# Patient Record
Sex: Female | Born: 1935 | Race: White | Hispanic: No | Smoking: Former smoker
Health system: Southern US, Community
[De-identification: ages and names within clinical notes are randomized; demographics above are authoritative.]

## PROBLEM LIST (undated history)

## (undated) DIAGNOSIS — H409 Unspecified glaucoma: Secondary | ICD-10-CM

## (undated) DIAGNOSIS — I340 Nonrheumatic mitral (valve) insufficiency: Secondary | ICD-10-CM

## (undated) DIAGNOSIS — D62 Acute posthemorrhagic anemia: Secondary | ICD-10-CM

## (undated) DIAGNOSIS — I442 Atrioventricular block, complete: Secondary | ICD-10-CM

## (undated) DIAGNOSIS — I10 Essential (primary) hypertension: Secondary | ICD-10-CM

## (undated) DIAGNOSIS — K649 Unspecified hemorrhoids: Secondary | ICD-10-CM

## (undated) DIAGNOSIS — I480 Paroxysmal atrial fibrillation: Secondary | ICD-10-CM

## (undated) DIAGNOSIS — E039 Hypothyroidism, unspecified: Secondary | ICD-10-CM

## (undated) DIAGNOSIS — F329 Major depressive disorder, single episode, unspecified: Secondary | ICD-10-CM

## (undated) DIAGNOSIS — E119 Type 2 diabetes mellitus without complications: Secondary | ICD-10-CM

## (undated) DIAGNOSIS — M1990 Unspecified osteoarthritis, unspecified site: Secondary | ICD-10-CM

## (undated) DIAGNOSIS — F32A Depression, unspecified: Secondary | ICD-10-CM

## (undated) DIAGNOSIS — I2510 Atherosclerotic heart disease of native coronary artery without angina pectoris: Secondary | ICD-10-CM

## (undated) DIAGNOSIS — Z8719 Personal history of other diseases of the digestive system: Secondary | ICD-10-CM

## (undated) DIAGNOSIS — I509 Heart failure, unspecified: Secondary | ICD-10-CM

## (undated) HISTORY — DX: Unspecified osteoarthritis, unspecified site: M19.90

## (undated) HISTORY — DX: Hypothyroidism, unspecified: E03.9

## (undated) HISTORY — DX: Unspecified glaucoma: H40.9

## (undated) HISTORY — DX: Heart failure, unspecified: I50.9

## (undated) HISTORY — DX: Atrioventricular block, complete: I44.2

## (undated) HISTORY — DX: Paroxysmal atrial fibrillation: I48.0

## (undated) HISTORY — DX: Personal history of other diseases of the digestive system: Z87.19

## (undated) HISTORY — DX: Type 2 diabetes mellitus without complications: E11.9

## (undated) HISTORY — DX: Unspecified hemorrhoids: K64.9

---

## 1998-10-14 ENCOUNTER — Other Ambulatory Visit: Admission: RE | Admit: 1998-10-14 | Discharge: 1998-10-14 | Payer: Self-pay | Admitting: Obstetrics & Gynecology

## 1998-10-16 ENCOUNTER — Encounter: Payer: Self-pay | Admitting: Cardiology

## 1998-10-16 ENCOUNTER — Inpatient Hospital Stay (HOSPITAL_COMMUNITY): Admission: EM | Admit: 1998-10-16 | Discharge: 1998-10-18 | Payer: Self-pay | Admitting: Cardiology

## 2004-03-05 ENCOUNTER — Ambulatory Visit (HOSPITAL_COMMUNITY): Admission: RE | Admit: 2004-03-05 | Discharge: 2004-03-05 | Payer: Self-pay | Admitting: Cardiology

## 2004-03-05 ENCOUNTER — Encounter (INDEPENDENT_AMBULATORY_CARE_PROVIDER_SITE_OTHER): Payer: Self-pay | Admitting: Cardiology

## 2005-06-14 ENCOUNTER — Ambulatory Visit (HOSPITAL_COMMUNITY): Admission: RE | Admit: 2005-06-14 | Discharge: 2005-06-14 | Payer: Self-pay | Admitting: Family Medicine

## 2006-07-06 ENCOUNTER — Ambulatory Visit (HOSPITAL_COMMUNITY): Admission: RE | Admit: 2006-07-06 | Discharge: 2006-07-06 | Payer: Self-pay | Admitting: Family Medicine

## 2006-12-27 HISTORY — PX: CHOLECYSTECTOMY: SHX55

## 2006-12-27 HISTORY — PX: ERCP: SHX60

## 2007-01-06 ENCOUNTER — Inpatient Hospital Stay (HOSPITAL_COMMUNITY): Admission: AD | Admit: 2007-01-06 | Discharge: 2007-01-11 | Payer: Self-pay | Admitting: Family Medicine

## 2007-01-06 ENCOUNTER — Ambulatory Visit (HOSPITAL_COMMUNITY): Admission: RE | Admit: 2007-01-06 | Discharge: 2007-01-06 | Payer: Self-pay | Admitting: Family Medicine

## 2007-01-07 ENCOUNTER — Ambulatory Visit: Payer: Self-pay | Admitting: Internal Medicine

## 2007-01-11 ENCOUNTER — Encounter (INDEPENDENT_AMBULATORY_CARE_PROVIDER_SITE_OTHER): Payer: Self-pay | Admitting: Specialist

## 2007-03-22 ENCOUNTER — Ambulatory Visit: Payer: Self-pay | Admitting: Internal Medicine

## 2007-03-24 ENCOUNTER — Ambulatory Visit (HOSPITAL_COMMUNITY): Admission: RE | Admit: 2007-03-24 | Discharge: 2007-03-24 | Payer: Self-pay | Admitting: Internal Medicine

## 2007-04-04 ENCOUNTER — Ambulatory Visit: Payer: Self-pay | Admitting: Internal Medicine

## 2007-04-04 ENCOUNTER — Ambulatory Visit (HOSPITAL_COMMUNITY): Admission: RE | Admit: 2007-04-04 | Discharge: 2007-04-04 | Payer: Self-pay | Admitting: Internal Medicine

## 2008-03-07 ENCOUNTER — Ambulatory Visit (HOSPITAL_COMMUNITY): Admission: RE | Admit: 2008-03-07 | Discharge: 2008-03-07 | Payer: Self-pay | Admitting: Family Medicine

## 2008-10-22 ENCOUNTER — Ambulatory Visit: Payer: Self-pay | Admitting: Gastroenterology

## 2008-10-29 ENCOUNTER — Ambulatory Visit (HOSPITAL_COMMUNITY): Admission: RE | Admit: 2008-10-29 | Discharge: 2008-10-29 | Payer: Self-pay | Admitting: Gastroenterology

## 2008-10-29 ENCOUNTER — Ambulatory Visit: Payer: Self-pay | Admitting: Gastroenterology

## 2008-10-29 ENCOUNTER — Encounter: Payer: Self-pay | Admitting: Gastroenterology

## 2008-12-25 ENCOUNTER — Ambulatory Visit: Payer: Self-pay | Admitting: Gastroenterology

## 2008-12-25 LAB — CONVERTED CEMR LAB
HCT: 36.3 % (ref 36.0–46.0)
Hemoglobin: 11.7 g/dL — ABNORMAL LOW (ref 12.0–15.0)

## 2009-04-24 DIAGNOSIS — E119 Type 2 diabetes mellitus without complications: Secondary | ICD-10-CM

## 2009-04-24 DIAGNOSIS — H409 Unspecified glaucoma: Secondary | ICD-10-CM

## 2009-04-24 DIAGNOSIS — I10 Essential (primary) hypertension: Secondary | ICD-10-CM

## 2009-04-24 DIAGNOSIS — Z8719 Personal history of other diseases of the digestive system: Secondary | ICD-10-CM

## 2009-04-24 DIAGNOSIS — K649 Unspecified hemorrhoids: Secondary | ICD-10-CM | POA: Insufficient documentation

## 2009-04-24 DIAGNOSIS — M1990 Unspecified osteoarthritis, unspecified site: Secondary | ICD-10-CM | POA: Insufficient documentation

## 2010-05-12 ENCOUNTER — Ambulatory Visit (HOSPITAL_COMMUNITY): Admission: RE | Admit: 2010-05-12 | Discharge: 2010-05-12 | Payer: Self-pay | Admitting: Family Medicine

## 2011-05-11 NOTE — Consult Note (Signed)
NAMEANJANNETTE, GAUGER                ACCOUNT NO.:  [id number]   MEDICAL RECORD NO.:  [id number]           PATIENT TYPE:  AMB   LOCATION:  DAY                           FACILITY:  [facility]   PHYSICIAN:  Kassie Mends, M.D.      DATE OF BIRTH:  02/05/1940   DATE OF CONSULTATION:  10/22/2008  DATE OF DISCHARGE:                                 CONSULTATION   REFERRING PHYSICIAN:  Melvyn Novas, MD   REASON FOR CONSULTATION:  Anemia.   HISTORY OF PRESENT ILLNESS:  Ms. Nasby is a 77 year old female who last  had a colonoscopy in February 2005 by Dr. Randa Evens in [place].  She  had no polyps, but she stated she had small veins that were seeping.  She sees rectal bleeding less than once a week.  It is slightly red and  a tinge on her underwear.  Her blood usually occurs after she eats fresh  vegetables and has increased number of bowel movements.  The blood is  usually seen 2-3 times after she has a bowel movement.  She denies any  black tarry stools.  She stopped her Slow FE in May 2009, and her  hemoglobin was measured in July 2009, and it was 10.5 with an MCV of  89.2 and a creatinine of 0.70.  Her last colonoscopy, she had problems  with the prep.  The prep caused her to have vomiting and she could not  keep it down.  She does eat meat just about daily.  She did not have  any problem swallowing, nausea, or vomiting.  She does not consume  aspirin, BC, or Goody powder.  She uses Advil less than once a month.  If she takes her Nexium, she does not have heartburn.   PAST MEDICAL HISTORY:  1. Diabetes.  2. Hypertension.  3. Glaucoma.   ALLERGIES:  QUININE and PRINIVIL.   MEDICATIONS:  1. Benicar 40/12.5 mg daily.  2. Norvasc 10 mg daily.  3. Nexium 40 mg daily.  4. Januvia 100 mg daily.  5. Glucophage 500 two daily.  6. Iron.  7. Alphagan.  8. Lumigan.   PAST SURGICAL HISTORY:  Cholecystectomy in January 2008 due to  choledocholithiasis with ERCP and sphincterotomy in  January 2008 and  April 2008.   FAMILY HISTORY:  She denies any family history of colon cancer or colon  polyps.   SOCIAL HISTORY:  She has been married for approximately 58 years.  She  is retired from working various odd jobs.  She quit smoking 5 years ago.  She does not drink any alcohol.   REVIEW OF SYSTEMS:  As per the HPI.  Otherwise, all systems are  negative.   PHYSICAL EXAMINATION:  VITAL SIGNS:  Weight 160 pounds, height 5 feet 8  inches, BMI 24.3 (healthy), temperature 98.1, blood pressure 150/74, and  pulse 78.GENERAL:  She is in no apparent distress.  Alert and oriented  x4.HEENT:  Atraumatic and normocephalic.  Pupils are equal and react to  light.  Mouth, no oral lesions.  Posterior pharynx without erythema or  exudate.LUNGS:  Clear to auscultation bilaterally.CARDIOVASCULAR:  Regular rate and rhythm.  No murmur.  Normal S1 and S2.ABDOMEN:  Bowel  sounds are present.  Soft, nontender, and nondistended.  No rebound or  guarding.EXTREMITIES:  No cyanosis or edema.NEUROLOGIC:  She has no  focal neurologic deficits.   ASSESSMENT:  Ms. Flener is a 77 year old female who presents with a  normocytic anemia and rectal bleeding.  The differential diagnosis  includes colorectal polyp, colorectal malignancy, or arteriovenous  malformations in the gastrointestinal tract.  She has a little  likelihood of having a gastric malignancy. Thank you for allowing me to  see Ms. Fredric Mare in consultation.  My recommendations follow.   RECOMMENDATIONS:  1. She will be scheduled for colonoscopy with a MiraLax bowel prep.      She should hold her Januvia on the day of her procedure.  2. She will be scheduling a follow up appointment in 3 months.  Will      also check a hemoglobin, hematocrit, and ferritin today.      Kassie Mends, M.D.  Electronically Signed     SM/MEDQ  D:  10/22/2008  T:  10/23/2008  Job:  [id number]   cc:   Melvyn Novas, MD  Fax: [phone]

## 2011-05-11 NOTE — Assessment & Plan Note (Signed)
NAMEMarland [name]  CRYSTALL, [name]                 CHART#:  [id number]   DATE:  12/25/2008                       DOB:  06-Sep-1932   REFERRING Sharma Lawrance:  Melvyn Novas, MD   PROBLEM LIST:  1. 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  2. Sigmoid colon diverticulosis.  3. Tubular adenoma of the hepatic flexure and rectum on colonoscopy in      November 2009.  4. Moderate internal hemorrhoids.  5. Diabetes.  6. Hypertension.  7. Glaucoma.   SUBJECTIVE:  [name] is a 77 year old female who presents as a return  patient visit.  She was last seen for colonoscopy in November 2009.  She  had AVMs ablated.  She had an upper endoscopy in 2005 with benign small  bowel biopsies.  Her hemoglobin and hematocrit were checked in October  2009, and she was on iron at that time.  Her hemoglobin was 12 and her  ferritin was 17.  She denies any bright red blood per rectum or black  stool.  She denies any nausea, vomiting, or craving ice.  She does not  feel tired.  She has no heartburn or indigestion as long as she takes  Nexium.  She has 1-2 good bowel movements a day.  Her iron was stopped  in October.   MEDICATIONS:  1. Benicar.  2. Norvasc.  3. Nexium 40 mg daily.  4. Januvia.  5. Glucophage.  6. Alphagan.  7. Lumigan.  8. B12.   OBJECTIVE:  VITAL SIGNS:  Weight 160 pounds, height 5 feet 8 inches,  temperature 98.2, blood pressure 122/78, and pulse 80.GENERAL:  She is  in no apparent distress.  Alert and oriented x4.LUNGS:  Clear to  auscultation bilaterally.CARDIOVASCULAR:  Regular rhythm.ABDOMEN:  Bowel  sounds are present.  Soft, nontender, and nondistended.   ASSESSMENT:  [name] is a 77 year old female who has cecal and  ascending colon arteriovenous malformations, likely contributing to her  normocytic anemia.  She also had a low normal iron.  She had 2 tubular  simple adenomas removed in October 2009. Thank you for allowing me to  see [name] in consultation.  My  recommendations follow.   RECOMMENDATIONS:  1. Screening colonoscopy in 10 years if she remains healthy.  2. Continue Nexium.  3. Will check her hemoglobin, hematocrit, and ferritin and call her at      home with the results.  4. She may follow up with me as needed.       Kassie Mends, M.D.  Electronically Signed     SM/MEDQ  D:  12/25/2008  T:  12/25/2008  Job:  [id number]   cc:   Melvyn Novas, MD

## 2011-05-11 NOTE — Op Note (Signed)
NAMEJEROLYN, [name]                ACCOUNT NO.:  [id number]   MEDICAL RECORD NO.:  [id number]          PATIENT TYPE:  AMB   LOCATION:  DAY                           FACILITY:  [facility]   PHYSICIAN:  Kassie Mends, M.D.      DATE OF BIRTH:  1932-12-05   DATE OF PROCEDURE:  DATE OF DISCHARGE:                               OPERATIVE REPORT   REFERRING PHYSICIAN:  Melvyn Novas, MD   PROCEDURE:  Colonoscopy with ablation of AVMs, cold forceps polypectomy,  and snare cautery polypectomy.   INDICATION FOR EXAM:  [name] is a 77 year old female who presents  with a normocytic anemia and requirement for iron chronically.  She had  a colonoscopy in 2005 and said she has small veins that were seeping.  She occasionally sees red blood from her rectum.   FINDINGS:  1. Small cecal AVM ablated (20 w) using the heater probe.  Large      ascending colon AVM ablated (25 w) using the heater probe.  The      ascending colon polyp bled a significant amount when cautery was      applied.  2. Frequent sigmoid colon diverticula.  3. A 4-mm hepatic flexure polyp removed via cold forceps.  A 6-mm      sessile rectal polyp removed via snare cautery.  Otherwise, no      masses or inflammatory changes seen.  4. Moderate internal hemorrhoids.   DIAGNOSES:  1. Rectal bleeding likely secondary to hemorrhoids.  2. Normocytic anemia likely secondary to colon arteriovenous      malformations.   RECOMMENDATIONS:  1. [name] already has a follow up appointment to see me in 3      months.  We will check her hemoglobin and hematocrit and ferritin      at that time.  2. No aspirin, NSAIDs, or anticoagulation for 7 days.  3. Will call [name] with the results of her biopsies.  If she has      a simple adenoma, then she may have screening colonoscopy in 10      years.  4. She should follow a high-fiber diet.  She was given a handout on      high-fiber diet, polyps, diverticulosis, and  hemorrhoids.  5. If she has significant amount of rectal bleeding, then she would be      a candidate for Anusol-HC per rectum every 12 hours for 10 days.   MEDICATIONS:  1. Demerol 50 mg IV.  2. Versed 4 mg IV.   PROCEDURE TECHNIQUE:  Physical exam was performed.  Informed consent was  obtained from the patient after explaining the benefits, risks, and  alternatives to the procedure.  The patient was connected to the monitor  and placed in the left lateral position.  Continuous oxygen was provided  by nasal cannula.  IV medicine administered through an indwelling  cannula.  After administration of sedation and rectal exam, the  patient's rectum was intubated, and the scope was advanced under direct  visualization to the cecum.  The scope was removed slowly  by carefully  examining the color, texture, anatomy, and integrity of the mucosa on  the way out.  The patient was recovered in endoscopy and discharged home  in satisfactory condition.      Kassie Mends, M.D.  Electronically Signed     SM/MEDQ  D:  10/29/2008  T:  10/29/2008  Job:  [id number]   cc:   Melvyn Novas, MD  Fax: [phone]

## 2011-05-14 NOTE — H&P (Signed)
NAMEJESSCIA, IMM                ACCOUNT NO.:  [id number]   MEDICAL RECORD NO.:  [id number]          PATIENT TYPE:  INP   LOCATION:  A318                          FACILITY:  [facility]   PHYSICIAN:  Melvyn Novas, MDDATE OF BIRTH:  1932/08/04   DATE OF ADMISSION:  01/06/2007  DATE OF DISCHARGE:  LH                              HISTORY & PHYSICAL   The patient is a 77 year old white female with a 4-day history of right  upper quadrant pain radiating to her right shoulder. This was associated  with nausea and dyspepsia; however, no vomiting, melena, hematemesis or  hematochezia. Liver function tests were essentially normal in office.  She came in two days later, and CAT scan revealed choledocholithiasis,  and she is admitted for presumed ERCP intervention and presumed  cholecystectomy due to her diabetic status. She denied any anginal pain,  orthopnea, PND.   PAST MEDICAL HISTORY:  1. Significant for type 2 diabetes.  2. Hypertension.  3. Degenerative joint disease.   PAST SURGICAL HISTORY:  Remarkable only for mandibular repair as a  youth.   CURRENT MEDICATIONS:  1. Norvasc 10.  2. Januvia 100 daily.  3. Glucophage 500 b.i.d.  4. Benicar 40/12.5 daily.  5. Nexium 40 mg per day.   She is a nonsmoker. Does not imbibe alcohol. Is married and lives with  husband.   PHYSICAL EXAMINATION:  Blood pressure is 132/78, pulse is 80 and  regular, temperature 99.2, respiratory rate is 20.  HEAD:  Normocephalic, atraumatic. PERRLA. EOM intact. Sclerae clear.  Conjunctivae pink.  NECK:  Shows no JVD, no carotid bruits, no thyromegaly, no thyroid  bruits.  LUNGS:  Show clear to A&P. No rales, wheeze or rhonchi appreciable.  HEART:  Regular rhythm. No murmurs, gallops, heaves, thrills or rubs.  ABDOMEN:  Soft, nontender. Bowel sounds are normoactive with no guarding  or rebound. No masses. No megaly. No peristaltic rushes noted. No  significant right upper quadrant tenderness.  EXTREMITIES:  No clubbing, cyanosis, or edema.  NEUROLOGICAL:  Cranial nerves II-XII grossly intact. The patient moves  all 4 extremities. Plantars are downgoing.   IMPRESSION:  1. Common bile duct stones, multiple.  2. Right upper quadrant pain.  3. Increased liver function tests.  4. Hypertension.  5. Diabetes.   The plan is to admit and make n.p.o. Have a possible ERCP for  intervention of choledocholithiasis. Make further recommendations as the  database expands.      Melvyn Novas, MD  Electronically Signed     RMD/MEDQ  D:  01/09/2007  T:  01/10/2007  Job:  [id number]

## 2011-05-14 NOTE — Discharge Summary (Signed)
NAMEMARGARETHA, MAHAN                ACCOUNT NO.:  [id number]   MEDICAL RECORD NO.:  [id number]          PATIENT TYPE:  INP   LOCATION:  A318                          FACILITY:  [facility]   PHYSICIAN:  Dalia Heading, M.D.  DATE OF BIRTH:  02/12/1932   DATE OF ADMISSION:  01/06/2007  DATE OF DISCHARGE:  01/16/2008LH                               DISCHARGE SUMMARY   HOSPITAL COURSE:  The patient is a 77 year old white female who was  admitted to Dr. Janna Arch for further evaluation and treatment of right  upper quadrant pain with a choledocholithiasis.  Dr. Karilyn Cota of  gastroenterology was consulted and the patient underwent an ERCP with  endoscopic stone extraction on January 07, 2007.  She tolerated the  procedure well.  Subsequently, I was consulted from the general surgery  standpoint to perform a laparoscopic cholecystectomy.  She was more  anemic during her hospital course stay which was felt to be secondary to  intravenous hydration and chronic disease.  She did undergo two units of  packed red blood cell transfusions prior to her surgery.  She  subsequently underwent an uneventful laparoscopic cholecystectomy on  January 11, 2007.  She tolerated the procedure well.   Her postoperative course was unremarkable.  The patient was discharged  home on January 11, 2007 in good improving condition.   DISCHARGE INSTRUCTIONS:  The patient is to follow up with Dr. Franky Macho on January 19, 2007.   DISCHARGE MEDICATIONS:  Darvocet-N 100 one to two tablets p.o. q.6h.  p.r.n. pain.  She is to resume all her other mediations as previously  prescribed.   PRINCIPAL DIAGNOSIS:  1. Cholecystitis, cholelithiasis.  2. Choledocholithiasis.  3. Non-insulin-dependent diabetes mellitus.  4. Hypertension.  5. Degenerative joint disease.   PRINCIPAL PROCEDURES:  1. ERCP with stone extraction by Dr. Karilyn Cota on January 07, 2007.  2. Laparoscopic cholecystectomy by Dr. Franky Macho on January 11, 2007.      Dalia Heading, M.D.  Electronically Signed     MAJ/MEDQ  D:  01/11/2007  T:  01/12/2007  Job:  [id number]   cc:   Lionel December, M.D.  [address]  [place]  [place] [address]   Melvyn Novas, MD  Fax: [phone]

## 2011-05-14 NOTE — Consult Note (Signed)
[name], WAINWRIGHT                ACCOUNT NO.:  [id number]   MEDICAL RECORD NO.:  [id number]          PATIENT TYPE:  INP   LOCATION:  A318                          FACILITY:  [facility]   PHYSICIAN:  Lionel December, M.D.    DATE OF BIRTH:  1931-12-30   DATE OF CONSULTATION:  01/06/2007  DATE OF DISCHARGE:                                 CONSULTATION   REASON FOR CONSULTATION:  Choledocholithiasis.   HISTORY OF PRESENT ILLNESS:  Ms. Tena is a 77 year old Caucasian  female who was in her usual state of health until 8 days ago, when she  noted infrascapular pain radiating into her right upper quadrant.  It  was sharp pain and at times intense.  She did not experience any nausea,  vomiting, fever or chills for the first 4 or 5 days, but 4 days ago she  developed nausea, vomiting as well as chills.  She was seen by Dr.  Vickey Huger DonDiego.  She was begun on Levaquin.  She also had lab  studies and her LFTs were abnormal.  This morning she had abdominal  pelvic CT.  She had dilated intra and extrahepatic biliary system.  Her  CBD measured 12 mm with 1 cm size stone, as well as sludge.  She also  had periampullary diverticulum.  She was also noted to have left colonic  diverticulosis.  The patient was therefore hospitalized for further  therapy.  The last time she threw up was yesterday.  She states she has  not been eating well since her nausea and vomiting began.  She has been  on clear liquids.  She has noted dark-colored urine, but no change in  her stools.  She denies recent weight loss, melena or rectal bleeding.  She states her heartburn is well-controlled with therapy.  The patient  states that she had vague pain in the right upper quadrant and  infrascapular area in August last year.  She had an ultrasound done at  the outside facility, and she was found to have stones.  Dr. Janna Arch  was not convinced that her symptoms were due to cholelithiasis, and  therefore surgery was not  recommended.   HOME MEDICATIONS:  1. Norvasc 10 mg daily.  2. Benicar 40/12.5 daily.  3. Januvia 100 mg p.o. daily.  4. Nexium 40 mg p.o. q.a.m.  5. Alphagan eye drops to both eyes b.i.d.  6. Lumigan eye drops, both eyes once daily.  7. Glucophage 1 gram daily.  8. Levaquin 500 mg daily. (but she did not take this today).   PAST MEDICAL HISTORY:  She has chronic GERD.  She had an EGD by Dr.  Randa Evens in 1999 which was normal. At that time she also had a screening  colonoscopy, which reportedly was normal.  She has been hypertensive for  several years and has had diabetes for same duration.  For most of these  years she has been controlled on diet.  She had jaw bone reconstruction  in 1984.  She had part of two of her ribs removed for grafts; she has  not had  any problems since then.  She also has glaucoma.   ALLERGIES:  SULFA which causes chest pain.   FAMILY HISTORY:  Both parents are deceased; father died at age 43 and  mother had diabetes and died at age 83.  She has two brothers in fair  health.  One brother is diabetic.   SOCIAL HISTORY:  She is married.  She has 5 children in good health.  She worked at a [employer] for 20 years, but now retired.  She  stays busy.  She has never drank alcohol.  She smoked cigarettes for 25  years, about a pack a day but quit 5 years ago.   PHYSICAL EXAMINATION:  GENERAL:  A pleasant well-developed, well-  nourished Caucasian female who is in no acute distress.  VITAL SIGNS:  She weighs 70.6 kg.  She is 68 inches tall.  Pulse 80 per  minute and regular, blood pressure 97/50, respirations 18, temperature  97.5.  HEENT:  Conjunctivae is pink.  Sclerae may be slightly icteric.  Oropharyngeal mucosa is normal.  She has upper and lower dentures in  place.  NECK:  No neck masses or thyromegaly noted.  CARDIAC:  Wiith regular rhythm.  Normal S1 through S3.  No murmur or  gallop noted.  LUNGS:  Clear to auscultation.  ABDOMEN:  Full  symmetrical bowel sounds are normal on palpation.  She  has mild midepigastric tenderness, but no hepato or splenomegaly.  RECTAL:  Examination deferred.  EXTREMITIES:  She does not have peripheral edema or clubbing.   DIAGNOSTIC TESTING:  I have reviewed her abdominal pelvic CT with Dr.  Jeronimo Greaves, and I agree she has a large common duct stone along with  multiple stones in her gallbladder.  She also has periampullary  diverticulum and the duct appears to open into the duodenum just below  it.   ADMISSION LABS:  These are pending.   ASSESSMENT:  Ms. Treloar is a 77 year old Caucasian female who has  biliary colic.  She has a large stone in her bile duct which is dilated.  She has multiple stones in her gallbladder.  Clinically she appears to  be stable and does not appear to have cholangitis, but she needs to be  on systemic antibiotic therapy until the duct is cleared of the stone.  She will also need cholecystectomy once the bile duct is cleared.   RECOMMENDATIONS:  1. She will have serum amylase and lipase along with her admission lab      studies.  2. She will also have PT/PTT and she will be typed and crossed matched      for 2 units.  3. Levaquin 500 mg IV q.24 h, first dose to be given now.  4. She will be n.p.o. after midnight for ERCP with sphincterotomy and      stone extraction to be performed tomorrow morning.   I have reviewed the procedure in detail, along with the potential risks;  she is agreeable.   We would like to thank Dr. Janna Arch for the opportunity to participate  in the care of this nice lady.      Lionel December, M.D.  Electronically Signed     NR/MEDQ  D:  01/06/2007  T:  01/06/2007  Job:  [id number]

## 2011-05-14 NOTE — Discharge Summary (Signed)
NAMEDARLINA, MCCAUGHEY                ACCOUNT NO.:  [id number]   MEDICAL RECORD NO.:  [id number]          PATIENT TYPE:  INP   LOCATION:  A318                          FACILITY:  [facility]   PHYSICIAN:  Melvyn Novas, MDDATE OF BIRTH:  Mar 22, 1932   DATE OF ADMISSION:  01/06/2007  DATE OF DISCHARGE:  01/16/2008LH                               DISCHARGE SUMMARY   The patient is a 77 year old white female with history of type 2  diabetes and hypertension who apparently had intermittent infrascapular  pain.  Gallbladder months ago revealed cholelithiasis.  However, the  bile ducts were clear with no gallbladder wall thickening or distention.  She continued to have symptoms.  Initial liver function tests were  normal.  Then subsequent liver function tests were elevated. CT scan was  done showing choledocholithiasis. She was admitted, had an ERCP done and  two or three stones were extracted  with a stent placed.  Then she  subsequently had a cholecystectomy laparoscopically by Dr. Lovell Sheehan.  She  continued to do well.  Liver function tests returned to normal.  She had  transfusion of  two units of packed cells.  Hemoglobin on discharge was  10.2.   DISCHARGE MEDICATIONS:  1. Darvocet-N 100 one q.6h. p.r.n.  2. Norvasc 10 mg.  3. Benicar 40/12.5 mg daily.  4. Glucophage 500 mg b.i.d.  5. Januvia 100 mg daily.   FOLLOW UP:  She will follow up in the office in several days.  She will  also follow up with Dr. Lovell Sheehan.      Melvyn Novas, MD  Electronically Signed     RMD/MEDQ  D:  01/11/2007  T:  01/12/2007  Job:  [id number]

## 2011-05-14 NOTE — H&P (Signed)
[name], [name]                ACCOUNT NO.:  [id number]   MEDICAL RECORD NO.:  [id number]           PATIENT TYPE:  AMB   LOCATION:                                FACILITY:  [facility]   PHYSICIAN:  Lionel December, M.D.    DATE OF BIRTH:  [birth year]   DATE OF ADMISSION:  DATE OF DISCHARGE:  LH                              HISTORY & PHYSICAL   CHIEF COMPLAINT:  Follow-up for choledocholithiasis.   HISTORY OF PRESENT ILLNESS:  [name] is a 77 year old Caucasian female who  was admitted to [facility] on January 06, 2007 with biliary  colic and elevated transaminases.  She had dilated bile duct with the  stone in it.  She had ERCP with sphincterotomy on January 07, 2007.  She  had a sphincterotomy with removal of two stones, but the third one could  not be removed.  Therefore a biliary stent was left in place.  She did  well post ERCP and had laparoscopic cholecystectomy by Dr. Lovell Sheehan on  January 11, 2007.  She was discharged on the same day.   She states she is doing fine.  She had lab studies by Dr. Janna Arch last  week.  She says all of her lab work was normal.  We have requested a  copy.  She has a very good appetite.  She denies abdominal pain.  She  states she has had three episodes of vomiting since she left the  hospital, but all of these episodes occurred with greasy foods which she  has quit eating.  No history of hematemesis, melena or rectal bleeding.  Her heartburn is well-controlled with therapy.   She is on:  1. Benicar 40/12.5 daily.  2. Norvasc 10 mg daily.  3. Nexium 40 mg q.a.m.  4. Januvia 100 mg daily.  5. Glucophage 500 mg b.i.d.  6. Slow Fe 1 daily.  7. Alphagan eye drops to both eyes b.i.d.  8. Lumigan eye drops to both eyes daily.   PAST MEDICAL HISTORY:  1. She has chronic GERD.  2. Diabetes mellitus.  3. History of coronary artery disease with stenting in January 2008      and is doing well.  4. She has bilateral glaucoma.  5. She had EGD and  colonoscopy by Dr. Randa Evens in 1999, both of which      were normal.  6. She had jaw bone reconstruction 1984.  She had bone cartilage taken      from her rib for grafting.  7. She was found to be anemic during her recent hospitalization and      treated with iron. It was felt that she may have poor iron      absorption due to chronic PPI therapy.   ALLERGIES:  SULFA which induced chest pain and PRINIVIL which resulted  in throat and tongue swelling.   OBJECTIVE:  VITAL SIGNS:  Weight 155 pounds.  She is 5 feet 8 inches  tall.  Pulse 64 per minute, blood pressure 128/82, temperature is 98.  HEENT:  Conjunctivae is pink.  Sclerae is nonicteric.  Oral pharyngeal  mucosa is normal.  She edentulous.  Conjunctivae is pink.  Sclerae is  nonicteric.  Oropharyngeal mucosa is normal.  She has upper and lower  dentures in place.  NECK:  No neck masses are noted.  CARDIOVASCULAR:  Cardiac exam with regular rhythm.  Normal S1, S2.  Grade 2-3/6 systolic ejection murmur best heard at the LSB.  LUNGS:  Clear to auscultation.  ABDOMEN:  Soft and nontender without organomegaly or masses.  EXTREMITIES:  No peripheral edema or clubbing noted.   ASSESSMENT:  1. [name] is a 77 year old Caucasian female who had endoscopic      retrograde cholangiopancreatography with sphincterotomy and removal      of two stones for bile ducts on January 07, 2007.  Third stone      could not be removed and therefore stent was left in place.      Following the procedure, she had laparoscopic cholecystectomy and      has done well.  If she still has stent in place, she will undergo      ERCP with removal of stent and the stone.  2. History of anemia.  Recent hemoglobin and hematocrit poorly normal.   PLAN:  We will request recent lab data from Dr. Otilio Saber office.   Will schedule for ERCP with stent and stone removal next week, but she  will have a plain film 2 days before to make sure that the stent is  still in  place.   I have reviewed the procedure with the patient.  She is agreeable.  The  patient can take her Benicar, Norvasc and Nexium with a few sips of  water the morning of the test, but advised not to take her Januvia.      Lionel December, M.D.  Electronically Signed     NR/MEDQ  D:  03/22/2007  T:  03/22/2007  Job:  [id number]   cc:   Melvyn Novas, MD  Fax: [phone]

## 2011-05-14 NOTE — Op Note (Signed)
NAMEDENISIA, HARPOLE                ACCOUNT NO.:  [id number]   MEDICAL RECORD NO.:  [id number]          PATIENT TYPE:  AMB   LOCATION:  DAY                           FACILITY:  [facility]   PHYSICIAN:  Lionel December, M.D.    DATE OF BIRTH:  11-Feb-1932   DATE OF PROCEDURE:  04/04/2007  DATE OF DISCHARGE:                               OPERATIVE REPORT   PROCEDURE:  Endoscopic retrograde cholangiopancreatography with removal  of biliary stent, extension of sphincterotomy with removal of sludge or  small stones.   INDICATIONS:  [name] is 77 year old Caucasian female who presented with  biliary colic back in [id number].  She had choledocholithiasis.  She  had ERCP with sphincterotomy on 01/07/2007 with removal of two stones  but third stone could not be removed.  Therefore stent was left in  place.  She subsequently had a laparoscopic cholecystectomy and has done  well.  She had plain film last week and stent is still in place.  Her  LFTs and CBC from 03/16/2007 were normal.  She is now undergoing repeat  ERCP for the stent and stone removal.  Procedure risks were reviewed  with the patient, informed consent was obtained.   MEDS FOR ANESTHESIA:  Please see anesthesia record for details.   FINDINGS:  Procedure performed in the OR.  The patient was placed under  anesthesia, she was intubated and turned in semi prone position.  Pentax  video duodenoscope was passed oropharynx without any difficulty into  stomach and across the pylorus into bulb and descending duodenum.  Stent  was in place.  Was caught with snare and removed under fluoroscopic  control.  Duodenoscope was passed again in the second part of the  duodenum and the CBD was selectively cannulated.  Dilute contrast was  injected.  Duodenoscope was passed again into second part of the  duodenum and CBD was easily cannulated with RX 44 autotome  and 035  hydra Jag wire.  Dilute contrast was injected.  CBD and CHD were dilated  about  10-11 mL.  There were small filling defects distally.  4 wire  dormia basket was passed through bile duct few times but no stone was  retrieved.  There was some debris or sludge.  Sphincterotomy was  extended.  Subsequently passed balloon stone extractor through the bile  duct few times removal of some debris but there was no stone noted.  The  drainage was satisfactory.  There is also periampullary diverticulum to  the right of the ampulla not mentioned above.   Endoscope was withdrawn.  The patient was extubated and brought to PACU  in stable condition.   FINAL DIAGNOSIS:  Biliary stent removed.  Sphincterotomy extended with removal of debris and/or small stones.   RECOMMENDATIONS:  Clear liquids today.  She will resume her usual diet  in a.m.[name]  She will be going home later this afternoon.      Lionel December, M.D.  Electronically Signed     NR/MEDQ  D:  04/04/2007  T:  04/04/2007  Job:  [id number]   cc:   Vickey Huger  DonDiego, MD  Fax: [phone]

## 2011-05-14 NOTE — Op Note (Signed)
[name], [name]                ACCOUNT NO.:  [id number]   MEDICAL RECORD NO.:  [id number]          PATIENT TYPE:  INP   LOCATION:  A318                          FACILITY:  [facility]   PHYSICIAN:  Lionel December, M.D.    DATE OF BIRTH:  10-26-1932   DATE OF PROCEDURE:  01/07/2007  DATE OF DISCHARGE:                               OPERATIVE REPORT   PROCEDURE:  ERCP with sphincterotomy, stone extraction, and biliary  stent placement.   INDICATIONS:  [name] is a 77 year old Caucasian female who has  choledocholithiasis.  She also has cholelithiasis.  She is undergoing  therapeutic ERCP.  The procedure risks were reviewed the patient, and  informed consent was obtained.   MEDICATIONS FOR SEDATION/ANESTHESIA:  Please see anesthesia records for  details.   FINDINGS:  The procedure performed in the OR.  The patient was given  general anesthesia.  She was intubated and positioned in the prone or  semi-prone position.  The Pentax video duodenoscope was passed via  oropharynx without any difficulty into the esophagus and stomach.  The  antral and pyloric mucosa was normal.  The scope was passed across the  bulb into the descending duodenum.  There was a large duodenal  diverticulum, and the ampulla was located along the left wall inferiorly  looking inside.  Using the sphincterotome, it was somewhat pulled out.  Cannulation was attempted with RX44 autotome and 0.35 Hydra Jagwire.  The pancreatic duct was initially cannulated.  It was gently filled with  dilute contrast, and no mucosal abnormalities or filling defect were  noted.  The pancreatic guidewire was left in place.  It tended to bring  the ampulla out.  The CBD was cannulated with difficulty.  The distal  end was somewhat tortuous.  As it was cannulated, another 0.35 Hydra  Jagwire was left in place.  It was filled with dilute contrast.  The CBD  and CHD were markedly dilated about 13-14 mm in diameter.  Intrahepatic  biliary radicals  were also filled and prominent.  There were three  filling defects noted.  They were faceted.  Two were distally and one  was in the hepatic duct.  Sphincterotomy was performed.  It was moderate  size.  A Dormia basket was used, and two of the stones were easily  pulled out.  The third stone just could not be caught with a wire.  Multiple attempts were made to catch the third stone, but attempts were  unsuccessful.  Therefore, a 10-French 7-cm long biliary stent was placed  in the bile ducts.  During this process, the guidewire from the  pancreatic duct fell into the duodenal lumen.  I attempted a few times  to recannulate the pancreatic duct but could not do so, as the view was  obliterated.  During this process, a guidewire in the pancreatic duct  fell out.  The pancreatic duct could not be recannulated because the  view was partially obscured by the biliary stent covering this area.  The endoscope was withdrawn.  The patient was extubated and brought to  the PACU in  stable condition.  She tolerated the procedure well.   FINAL DIAGNOSES:  1. The ampulla located along the left wall of duodenal diverticulum,      making cannulation difficult.  2. Normal pancreatogram.  3. Markedly dilated common bile duct and common hepatic duct with      three filling defects or stones.  Two were removed with Dormia      basket, but the third one could not be caught.  4. A 10-French 7-cm long biliary stent placed.  5. Pancreatic duct stent could not be placed, as described above.   RECOMMENDATIONS:  1. She will be on clear liquids today.  She will have CBC, LFTs, serum      amylase, and MET-7 in the morning.  2. We will also check her B12, folate, iron, TIBC, ferritin, as she      has anemia.  3. She will also need surgical consultation for cholecystectomy.  4. She will return for repeat ERCP in 6-8 weeks for removal of stent      and single stone that could not be removed today.      Lionel December, M.D.  Electronically Signed     NR/MEDQ  D:  01/07/2007  T:  01/08/2007  Job:  [id number]   cc:   Melvyn Novas, MD  Fax: [phone]

## 2011-05-14 NOTE — Op Note (Signed)
NAMECHUNDRA, [name]                ACCOUNT NO.:  [id number]   MEDICAL RECORD NO.:  [id number]          PATIENT TYPE:  INP   LOCATION:  A318                          FACILITY:  [facility]   PHYSICIAN:  Dalia Heading, M.D.  DATE OF BIRTH:  08-30-1932   DATE OF PROCEDURE:  01/11/2007  DATE OF DISCHARGE:                               OPERATIVE REPORT   PREOPERATIVE DIAGNOSES:  Cholecystitis, cholelithiasis.   POSTOPERATIVE DIAGNOSES:  Cholecystitis, cholelithiasis.   PROCEDURE:  Laparoscopic cholecystectomy.   SURGEON:  Dalia Heading, M.D.   ANESTHESIA:  General endotracheal.   INDICATIONS:  The patient is a 77 year old white female, status post an  ERCP for removal of a common bile duct stone, who now presents for  laparoscopic cholecystectomy.  The risks and benefits of the procedure  including bleeding, infection, hepatobiliary injury, and the possibility  of an open procedure were fully explained to the patient, who gave  informed consent.   PROCEDURE NOTE:  The patient was placed in the supine position.  After  induction of general endotracheal anesthesia, the abdomen was prepped  and draped using the usual sterile technique with Betadine.  Surgical  site confirmation was performed.   A supraumbilical incision was made down to the fascia.  A Veress needle  was introduced into the abdominal cavity and confirmation of placement  was done using the saline drop test.  The abdomen was then insufflated  to 16 mmHg pressure.  An 11 mm trocar was introduced into the abdominal  cavity under direct visualization without difficulty.  The patient was  placed in reverse Trendelenburg position.  An additional 11-mm trocar  was placed in the epigastric region, and 5-mm trocars were placed in the  right upper quadrant and right flank regions.  The liver was inspected  and noted to within normal limits.  The gallbladder was retracted  superiorly and laterally.  Dissection was begun around the  infundibulum  of the gallbladder.  The cystic duct was first identified.  The juncture  to the infundibulum was fully identified.  Endo clips were placed  proximally and distally on the cystic duct, and the cystic duct was  divided.  This was likewise done to the cystic artery.  The gallbladder  was then freed away from the gallbladder fossa using Bovie  electrocautery.  The gallbladder was delivered through the epigastric  trocar site using an EndoCatch bag.  The gallbladder fossa was  inspected.  No abnormal bleeding or bile leakage was noted.  Surgicel  was placed in the gallbladder fossa.  All fluid and air were then  evacuated from the abdominal cavity prior to removal of the trocars.   All wounds were irrigated with normal saline.  All wounds were injected  with 0.5% Sensorcaine.  The supraumbilical fascia as well as epigastric  fascia were reapproximated using 0 Vicryl interrupted sutures.  All skin  incisions were closed using staples.  Betadine ointment and dry sterile  dressings were applied.   All tape and needle counts were correct at the end of the procedure.  The patient was extubated  in the operating room and went back to the  recovery room awake and in stable condition.   COMPLICATIONS:  None.   SPECIMEN:  Gallbladder with stones.   BLOOD LOSS:  Minimal.      Dalia Heading, M.D.  Electronically Signed     MAJ/MEDQ  D:  01/11/2007  T:  01/11/2007  Job:  [id number]   cc:   Melvyn Novas, MD  Fax: [phone]

## 2012-03-26 ENCOUNTER — Other Ambulatory Visit: Payer: Self-pay

## 2012-03-26 ENCOUNTER — Encounter (HOSPITAL_COMMUNITY): Payer: Self-pay | Admitting: *Deleted

## 2012-03-26 ENCOUNTER — Emergency Department (HOSPITAL_COMMUNITY): Payer: Medicare Other

## 2012-03-26 ENCOUNTER — Inpatient Hospital Stay (HOSPITAL_COMMUNITY)
Admission: EM | Admit: 2012-03-26 | Discharge: 2012-04-17 | DRG: 216 | Disposition: A | Discharge status: Skilled Nursing Facility | Payer: Medicare Other | Attending: Cardiothoracic Surgery | Admitting: Cardiothoracic Surgery

## 2012-03-26 DIAGNOSIS — R5381 Other malaise: Secondary | ICD-10-CM | POA: Diagnosis not present

## 2012-03-26 DIAGNOSIS — I482 Chronic atrial fibrillation, unspecified: Secondary | ICD-10-CM

## 2012-03-26 DIAGNOSIS — I10 Essential (primary) hypertension: Secondary | ICD-10-CM | POA: Insufficient documentation

## 2012-03-26 DIAGNOSIS — Z7982 Long term (current) use of aspirin: Secondary | ICD-10-CM

## 2012-03-26 DIAGNOSIS — I2589 Other forms of chronic ischemic heart disease: Secondary | ICD-10-CM | POA: Diagnosis present

## 2012-03-26 DIAGNOSIS — Z888 Allergy status to other drugs, medicaments and biological substances status: Secondary | ICD-10-CM

## 2012-03-26 DIAGNOSIS — I214 Non-ST elevation (NSTEMI) myocardial infarction: Principal | ICD-10-CM | POA: Diagnosis present

## 2012-03-26 DIAGNOSIS — Z9889 Other specified postprocedural states: Secondary | ICD-10-CM

## 2012-03-26 DIAGNOSIS — E8779 Other fluid overload: Secondary | ICD-10-CM | POA: Diagnosis not present

## 2012-03-26 DIAGNOSIS — Z87891 Personal history of nicotine dependence: Secondary | ICD-10-CM

## 2012-03-26 DIAGNOSIS — D62 Acute posthemorrhagic anemia: Secondary | ICD-10-CM | POA: Diagnosis not present

## 2012-03-26 DIAGNOSIS — I509 Heart failure, unspecified: Secondary | ICD-10-CM | POA: Diagnosis present

## 2012-03-26 DIAGNOSIS — I200 Unstable angina: Secondary | ICD-10-CM

## 2012-03-26 DIAGNOSIS — D509 Iron deficiency anemia, unspecified: Secondary | ICD-10-CM | POA: Diagnosis not present

## 2012-03-26 DIAGNOSIS — R9430 Abnormal result of cardiovascular function study, unspecified: Secondary | ICD-10-CM

## 2012-03-26 DIAGNOSIS — I059 Rheumatic mitral valve disease, unspecified: Secondary | ICD-10-CM | POA: Diagnosis present

## 2012-03-26 DIAGNOSIS — Z7901 Long term (current) use of anticoagulants: Secondary | ICD-10-CM

## 2012-03-26 DIAGNOSIS — I5042 Chronic combined systolic (congestive) and diastolic (congestive) heart failure: Secondary | ICD-10-CM | POA: Diagnosis present

## 2012-03-26 DIAGNOSIS — Z951 Presence of aortocoronary bypass graft: Secondary | ICD-10-CM

## 2012-03-26 DIAGNOSIS — I2510 Atherosclerotic heart disease of native coronary artery without angina pectoris: Secondary | ICD-10-CM | POA: Diagnosis present

## 2012-03-26 DIAGNOSIS — Z9981 Dependence on supplemental oxygen: Secondary | ICD-10-CM

## 2012-03-26 DIAGNOSIS — J90 Pleural effusion, not elsewhere classified: Secondary | ICD-10-CM | POA: Diagnosis not present

## 2012-03-26 DIAGNOSIS — I340 Nonrheumatic mitral (valve) insufficiency: Secondary | ICD-10-CM | POA: Diagnosis present

## 2012-03-26 DIAGNOSIS — F329 Major depressive disorder, single episode, unspecified: Secondary | ICD-10-CM | POA: Diagnosis present

## 2012-03-26 DIAGNOSIS — Z789 Other specified health status: Secondary | ICD-10-CM

## 2012-03-26 DIAGNOSIS — I498 Other specified cardiac arrhythmias: Secondary | ICD-10-CM | POA: Diagnosis not present

## 2012-03-26 DIAGNOSIS — I4891 Unspecified atrial fibrillation: Secondary | ICD-10-CM

## 2012-03-26 DIAGNOSIS — I5023 Acute on chronic systolic (congestive) heart failure: Secondary | ICD-10-CM | POA: Diagnosis present

## 2012-03-26 DIAGNOSIS — J811 Chronic pulmonary edema: Secondary | ICD-10-CM | POA: Diagnosis present

## 2012-03-26 DIAGNOSIS — E871 Hypo-osmolality and hyponatremia: Secondary | ICD-10-CM

## 2012-03-26 DIAGNOSIS — F3289 Other specified depressive episodes: Secondary | ICD-10-CM | POA: Diagnosis present

## 2012-03-26 DIAGNOSIS — H409 Unspecified glaucoma: Secondary | ICD-10-CM | POA: Diagnosis present

## 2012-03-26 DIAGNOSIS — R197 Diarrhea, unspecified: Secondary | ICD-10-CM | POA: Diagnosis not present

## 2012-03-26 DIAGNOSIS — M1990 Unspecified osteoarthritis, unspecified site: Secondary | ICD-10-CM | POA: Diagnosis present

## 2012-03-26 DIAGNOSIS — E119 Type 2 diabetes mellitus without complications: Secondary | ICD-10-CM | POA: Diagnosis present

## 2012-03-26 DIAGNOSIS — Z79899 Other long term (current) drug therapy: Secondary | ICD-10-CM

## 2012-03-26 HISTORY — DX: Essential (primary) hypertension: I10

## 2012-03-26 HISTORY — DX: Major depressive disorder, single episode, unspecified: F32.9

## 2012-03-26 HISTORY — DX: Depression, unspecified: F32.A

## 2012-03-26 HISTORY — DX: Atherosclerotic heart disease of native coronary artery without angina pectoris: I25.10

## 2012-03-26 HISTORY — DX: Nonrheumatic mitral (valve) insufficiency: I34.0

## 2012-03-26 LAB — CBC
HCT: 35.5 % — ABNORMAL LOW (ref 36.0–46.0)
MCH: 25.8 pg — ABNORMAL LOW (ref 26.0–34.0)
MCV: 79.1 fL (ref 78.0–100.0)
Platelets: 265 10*3/uL (ref 150–400)
RDW: 14.6 % (ref 11.5–15.5)

## 2012-03-26 LAB — DIFFERENTIAL
Basophils Absolute: 0.1 10*3/uL (ref 0.0–0.1)
Eosinophils Absolute: 0.1 10*3/uL (ref 0.0–0.7)
Eosinophils Relative: 1 % (ref 0–5)
Lymphs Abs: 1.6 10*3/uL (ref 0.7–4.0)
Monocytes Absolute: 0.6 10*3/uL (ref 0.1–1.0)

## 2012-03-26 LAB — BASIC METABOLIC PANEL
Calcium: 10.2 mg/dL (ref 8.4–10.5)
Creatinine, Ser: 0.77 mg/dL (ref 0.50–1.10)
GFR calc non Af Amer: 78 mL/min — ABNORMAL LOW (ref >90)
Glucose, Bld: 130 mg/dL — ABNORMAL HIGH (ref 70–99)
Sodium: 128 mEq/L — ABNORMAL LOW (ref 135–145)

## 2012-03-26 LAB — CARDIAC PANEL(CRET KIN+CKTOT+MB+TROPI)
CK, MB: 4.5 ng/mL — ABNORMAL HIGH (ref 0.3–4.0)
Troponin I: <0.3 ng/mL (ref <0.30)

## 2012-03-26 LAB — GLUCOSE, CAPILLARY: Glucose-Capillary: 119 mg/dL — ABNORMAL HIGH (ref 70–99)

## 2012-03-26 LAB — PROTIME-INR: INR: 0.89 (ref 0.00–1.49)

## 2012-03-26 LAB — POCT I-STAT TROPONIN I: Troponin i, poc: 0.14 ng/mL (ref 0.00–0.08)

## 2012-03-26 MED ORDER — HEPARIN (PORCINE) IN NACL 100-0.45 UNIT/ML-% IJ SOLN
16.0000 [IU]/kg/h | INTRAMUSCULAR | Status: DC
  Administered 2012-03-26: 16 [IU]/kg/h via INTRAVENOUS
  Filled 2012-03-26 (×2): qty 250

## 2012-03-26 MED ORDER — NITROGLYCERIN 0.4 MG SL SUBL
0.4000 mg | SUBLINGUAL_TABLET | Freq: Once | SUBLINGUAL | Status: AC
  Administered 2012-03-26: 0.4 mg via SUBLINGUAL

## 2012-03-26 MED ORDER — ASPIRIN 81 MG PO CHEW
CHEWABLE_TABLET | ORAL | Status: AC
  Administered 2012-03-26: 324 mg via ORAL
  Filled 2012-03-26: qty 4

## 2012-03-26 MED ORDER — NITROGLYCERIN 0.4 MG SL SUBL: 0.4000 mg | SUBLINGUAL_TABLET | SUBLINGUAL | Status: DC | PRN

## 2012-03-26 MED ORDER — ONDANSETRON HCL 4 MG/2ML IJ SOLN: 4.0000 mg | Freq: Four times a day (QID) | INTRAMUSCULAR | Status: DC | PRN

## 2012-03-26 MED ORDER — ACETAMINOPHEN 325 MG PO TABS: 650.0000 mg | ORAL_TABLET | ORAL | Status: DC | PRN

## 2012-03-26 MED ORDER — NITROGLYCERIN 0.4 MG SL SUBL
SUBLINGUAL_TABLET | SUBLINGUAL | Status: AC
  Administered 2012-03-26: 0.4 mg via SUBLINGUAL
  Filled 2012-03-26: qty 25

## 2012-03-26 MED ORDER — NITROGLYCERIN 0.4 MG SL SUBL
0.4000 mg | SUBLINGUAL_TABLET | SUBLINGUAL | Status: DC | PRN
  Filled 2012-03-26: qty 25

## 2012-03-26 MED ORDER — ASPIRIN EC 81 MG PO TBEC
81.0000 mg | DELAYED_RELEASE_TABLET | Freq: Every day | ORAL | Status: DC
  Administered 2012-03-28 – 2012-04-02 (×6): 81 mg via ORAL
  Filled 2012-03-26 (×8): qty 1

## 2012-03-26 MED ORDER — ASPIRIN 300 MG RE SUPP: 300.0000 mg | RECTAL | Status: DC

## 2012-03-26 MED ORDER — SODIUM CHLORIDE 0.9 % IJ SOLN
3.0000 mL | Freq: Two times a day (BID) | INTRAMUSCULAR | Status: DC
  Administered 2012-03-26: 3 mL via INTRAVENOUS

## 2012-03-26 MED ORDER — SODIUM CHLORIDE 0.9 % IV SOLN: 250.0000 mL | INTRAVENOUS | Status: DC | PRN

## 2012-03-26 MED ORDER — ASPIRIN 81 MG PO CHEW: 324.0000 mg | CHEWABLE_TABLET | ORAL | Status: DC

## 2012-03-26 MED ORDER — SODIUM CHLORIDE 0.9 % IJ SOLN: 3.0000 mL | INTRAMUSCULAR | Status: DC | PRN

## 2012-03-26 MED ORDER — HEPARIN (PORCINE) IN NACL 100-0.45 UNIT/ML-% IJ SOLN
900.0000 [IU]/h | INTRAMUSCULAR | Status: DC
  Administered 2012-03-27: 900 [IU]/h via INTRAVENOUS
  Filled 2012-03-26 (×2): qty 250

## 2012-03-26 MED ORDER — ASPIRIN 81 MG PO CHEW
324.0000 mg | CHEWABLE_TABLET | Freq: Once | ORAL | Status: AC
  Administered 2012-03-26: 324 mg via ORAL

## 2012-03-26 MED ORDER — ONDANSETRON HCL 4 MG/2ML IJ SOLN: 4.0000 mg | Freq: Three times a day (TID) | INTRAMUSCULAR | Status: DC | PRN

## 2012-03-26 MED ORDER — METOPROLOL TARTRATE 25 MG PO TABS
25.0000 mg | ORAL_TABLET | Freq: Two times a day (BID) | ORAL | Status: DC
  Administered 2012-03-26 – 2012-04-02 (×15): 25 mg via ORAL
  Filled 2012-03-26 (×21): qty 1

## 2012-03-26 MED ORDER — HEPARIN BOLUS VIA INFUSION
4000.0000 [IU] | Freq: Once | INTRAVENOUS | Status: AC
  Administered 2012-03-26: 4000 [IU] via INTRAVENOUS

## 2012-03-26 NOTE — Progress Notes (Signed)
ANTICOAGULATION CONSULT NOTE - Follow Up Consult  Pharmacy Consult for Heparin Indication: chest pain/ACS  Allergies  Allergen Reactions  . Lisinopril Anaphylaxis  . Quinine Other (See Comments)    Pt states heart attack-like symptoms  . Sulfonamide Derivatives Other (See Comments)    unknown    Patient Measurements: Height: 5\' 8"  (172.7 cm) Weight: 154 lb 15.7 oz (70.3 kg) IBW/kg (Calculated) : 63.9    Vital Signs: Temp: 98.2 F (36.8 C) (03/31 0900) Temp src: Oral (03/31 0900) BP: 138/80 mmHg (03/31 0900) Pulse Rate: 72  (03/31 0900)  Labs:  Basename 03/26/12 1647 03/26/12 0632  HGB -- 11.6*  HCT -- 35.5*  PLT -- 265  APTT -- 26  LABPROT -- 12.2  INR -- 0.89  HEPARINUNFRC 1.11* --  CREATININE -- 0.77  CKTOTAL -- 121  CKMB -- 4.5*  TROPONINI -- <0.30   Estimated Creatinine Clearance: 58.5 ml/min (by C-G formula based on Cr of 0.77).   Assessment: Admit for NSTEMI plan for cath tomorrow.  Heparin drip 1100 uts/hr HL 1.11 - drawn from opposite arm as heparin infusion > goal 0.3-0.7.  No bleeding noted.    Goal of Therapy:  Heparin level 0.3-0.7 units/ml   Plan:   Hold heparin for 1hr  Restart heparin drip 900 uts/hr  Hl daily Marcelino Scot 03/26/2012,5:40 PM

## 2012-03-26 NOTE — Progress Notes (Signed)
Patient ID: ERMELINDA ECKERT, female   DOB: 1932-01-09, 78 y.o.   MRN: [id number]   Patient ID: JARI CAROLLO MRN: [id number], DOB/AGE: [birth year]   Admit date: 03/26/2012   Primary Physician: Isabella Stalling, MD, MD Primary Cardiologist: none  Pt. Profile:  Mrs. Rossetti is a 78 year old married white female who presents with acute coronary syndrome.  Problem List  Past Medical History  Diagnosis Date  . Diabetes mellitus   . Hypertension   . Depression     History reviewed. No pertinent past surgical history.   Allergies  Allergies  Allergen Reactions  . Lisinopril   . Quinine   . Sulfonamide Derivatives     HPI She has a history of coronary disease with remote stenting in 1998. Records not available.  She woke at 3 AM this morning with substernal chest pressure going up into the right shoulder and neck and into the back. It waxed and waned until about 6 AM. He came into the ED in [facility]. EKG showed ST segment depression inferolaterally. Discomfort resolved with nitroglycerin. First enzyme now positive at 0.14. EKG here and a repeat EKG there showed resolution of ST segment changes. Other blood work unremarkable except for slight anemia, sodium 128, and low ferritin. Chest x-ray showed some vascular congestion.  She quit smoking years ago. She has hypertension and diabetes.  Home Medications  Prior to Admission medications   Medication Sig Start Date End Date Taking? Authorizing Provider  FLUoxetine (PROZAC) 20 MG tablet Take 20 mg by mouth daily.   Yes Historical Provider, MD  olmesartan (BENICAR) 40 MG tablet Take 40 mg by mouth daily.   Yes Historical Provider, MD  omeprazole (PRILOSEC) 20 MG capsule Take 20 mg by mouth daily.   Yes Historical Provider, MD  sitaGLIPtan-metformin (JANUMET) 50-500 MG per tablet Take 1 tablet by mouth 2 (two) times daily with a meal.   Yes Historical Provider, MD    Family History  No premature history of coronary disease. There  is history of hypertension and diabetes. Social History  Social history: She lives in [place] and is married. She is a former smoker. She does not use alcohol or drugs.   Past medical history she has for diabetes type 2, block,, hypertension,history of diverticulitis history of DJD, history of hemorrhoids, depression.  Allergies lisinopril, quinine, sulfonamide derivatives  Review of Systems General:  No chills, fever, night sweats or weight changes.  Cardiovascular:  No chest pain, dyspnea on exertion, edema, orthopnea, palpitations, paroxysmal nocturnal dyspnea. Dermatological: No rash, lesions/masses Respiratory: No cough, dyspnea Urologic: No hematuria, dysuria Abdominal:   No nausea, vomiting, diarrhea, bright red blood per rectum, melena, or hematemesis Neurologic:  No visual changes, wkns, changes in mental status. All other systems reviewed and are otherwise negative except as noted above.  Physical Exam  Blood pressure 138/80, pulse 72, temperature 98.2 F (36.8 C), temperature source Oral, resp. rate 18, height 5\' 8"  (1.727 m), weight 154 lb 15.7 oz (70.3 kg), SpO2 92.00%.  General: Pleasant, NAD.  Psych: Normal affect. Neuro: Alert and oriented X 3. Moves all extremities spontaneously. HEENT: Normal  Neck: Supple without bruits or JVD. Lungs:  Resp regular and unlabored, CTA. Heart: RRR no s3, s4, soft systolic murmur, S2 splits Abdomen: Soft, non-tender, non-distended, BS + x 4.  Extremities: No clubbing, cyanosis or edema. DP/PT/Radials 2+ and equal bilaterally.  Labs   [facility] 03/26/12 0632  CKTOTAL 121  CKMB 4.5*  TROPONINI <0.30   Lab Results  Component Value Date   WBC 6.4 03/26/2012   HGB 11.6* 03/26/2012   HCT 35.5* 03/26/2012   MCV 79.1 03/26/2012   PLT 265 03/26/2012    Lab 03/26/12 0632  NA 128*  K 3.6  CL 90*  CO2 25  BUN 13  CREATININE 0.77  CALCIUM 10.2  PROT --  BILITOT --  ALKPHOS --  ALT --  AST --  GLUCOSE 130*   No results  found for this basename: CHOL, HDL, LDLCALC, TRIG   No results found for this basename: DDIMER     Radiology/Studies  Dg Chest Portable 1 View  03/26/2012  *RADIOLOGY REPORT*  Clinical Data: Pressure, shortness of breath.  PORTABLE CHEST - 1 VIEW  Comparison: No priors.  Findings: Lung volumes are low.  There is cephalization of the pulmonary vasculature and indistinctness of the interstitial markings with multiple Kerley B lines throughout the periphery of the lungs bilaterally, compatible with mild interstitial pulmonary edema.  No definite consolidative airspace disease.  There is blunting of the left costophrenic sulcus which could suggest presence of left-sided pleural effusion, however, there are overlying old left-sided rib fractures laterally, suggesting underlying pleural scarring which is likely chronic.  Heart size is mildly enlarged. The patient is rotated to the left on today's exam, resulting in distortion of the mediastinal contours and reduced diagnostic sensitivity and specificity for mediastinal pathology.  Atherosclerotic calcifications in the arch of the aorta.  IMPRESSION: 1.  Findings compatible with mild congestive heart failure, as above. 2.  Atherosclerosis. 3.  Multiple old healed left-sided rib fractures with probable chronic scarring of the pleura at the left base.  A small left- sided pleural effusion is difficult to exclude.  Original Report Authenticated By: Florencia Reasons, M.D.    ECG  Serial EKGs reviewed. Last EKG showed resolution of ST segment depression inferolaterally. This one done at this hospital   ASSESSMENT AND PLAN  Mrs. Duplessis is a 78 year old married white female with a history of coronary disease and stenting. He presents with an acute coronary syndrome and was ruled in for a NSTEMI. Will treat with intravenous heparin and begin nitroglycerin paste. We'll initiate aspirin and low-dose beta blocker. Cycle enzymes. Check fasting lipids in the morning  since not on a statin. We'll hold n.p.o. for cardiac cath tomorrow.   Signed, Valera Castle, MD 03/26/2012, @[facility]

## 2012-03-26 NOTE — ED Notes (Signed)
Pt c/o right sided chest pain that radiates to right arm and into her tongue. This happened around 3:00am.

## 2012-03-26 NOTE — Consult Note (Signed)
ANTICOAGULATION CONSULT NOTE - Initial Consult  Pharmacy Consult for Heparin Indication: chest pain/ACS  Allergies  Allergen Reactions  . Lisinopril   . Quinine   . Sulfonamide Derivatives    Patient Measurements: Height: 5\' 8"  (172.7 cm) Weight: 155 lb (70.308 kg) IBW/kg (Calculated) : 63.9   Vital Signs: Temp: 97.5 F (36.4 C) (03/31 0616) BP: 133/71 mmHg (03/31 0730) Pulse Rate: 67  (03/31 0730)  Labs:  Basename 03/26/12 0632  HGB 11.6*  HCT 35.5*  PLT 265  APTT 26  LABPROT 12.2  INR 0.89  HEPARINUNFRC --  CREATININE 0.77  CKTOTAL 121  CKMB 4.5*  TROPONINI <0.30   Estimated Creatinine Clearance: 58.5 ml/min (by C-G formula based on Cr of 0.77).  Medical History: Past Medical History  Diagnosis Date  . Diabetes mellitus   . Hypertension   . Depression    Medications:  Scheduled:    . aspirin  324 mg Oral Once  . heparin  4,000 Units Intravenous Once  . nitroGLYCERIN      . nitroGLYCERIN  0.4 mg Sublingual Once   Assessment: 78yo c/o CP, platelets and INR WNL  Goal of Therapy:  Heparin level 0.3-0.7 units/ml   Plan: Heparin 4000 unit bolus then 16 units/kg/hr Check heparin level in 8 hrs and adjust as needed Heparin level daily CBC per protocol  Valrie Hart A 03/26/2012,7:51 AM

## 2012-03-26 NOTE — ED Provider Notes (Addendum)
History     CSN: [id number]  Arrival date & time 03/26/12  0611   First MD Initiated Contact with Patient 03/26/12 [phone]      Chief Complaint  Patient presents with  . Chest Pain    (Consider location/radiation/quality/duration/timing/severity/associated sxs/prior treatment) Patient is a 78 y.o. female presenting with chest pain. The history is provided by the patient.  Chest Pain   She was awakened at 3 AM by severe pressure feeling in her chest which radiated into her back. There is associated nausea and dyspnea without vomiting or diaphoresis. She also had a milder episode of pressure last night at 9 PM when she was doing some housecleaning. That pain lasted about 30 minutes. She tried taking Advil and TUMS. The Advil did not give her any relief and at times gave just a very slight relief. She rates the pain at 8/10. She said that she was too uncomfortable laying flat but couldn't tell that actually made the pain worse. Nothing made it better except for the times. She did have a cardiac stent placed in 1998. This pain is different from what she had prior to placement of her cardiac stent.  Past Medical History  Diagnosis Date  . Diabetes mellitus   . Hypertension   . Depression     History reviewed. No pertinent past surgical history.  History reviewed. No pertinent family history.  History  Substance Use Topics  . Smoking status: Never Smoker   . Smokeless tobacco: Not on file  . Alcohol Use: No    OB History    Grav Para Term Preterm Abortions TAB SAB Ect Mult Living                  Review of Systems  Cardiovascular: Positive for chest pain.  All other systems reviewed and are negative.    Allergies  Lisinopril; Quinine; and Sulfonamide derivatives  Home Medications   Current Outpatient Rx  Name Route Sig Dispense Refill  . FLUOXETINE HCL 20 MG PO TABS Oral Take 20 mg by mouth daily.    [name] OLMESARTAN MEDOXOMIL 40 MG PO TABS Oral Take 40 mg by mouth  daily.    [name] OMEPRAZOLE 20 MG PO CPDR Oral Take 20 mg by mouth daily.    [name] SITAGLIPTIN-METFORMIN HCL 50-500 MG PO TABS Oral Take 1 tablet by mouth 2 (two) times daily with a meal.      BP 128/79  Pulse 89  Temp 97.5 F (36.4 C)  Resp 18  Ht 5\' 8"  (1.727 m)  Wt 155 lb (70.308 kg)  BMI 23.57 kg/m2  SpO2 98%  Physical Exam  Nursing note and vitals reviewed.  78 year old female who appears uncomfortable. Vital signs are significant for mild hypertension with blood pressure 154/83. Oxygen saturation is 94% which is normal. Head is normocephalic and atraumatic. PERRLA, EOMI. Oropharynx is clear. Neck is nontender and supple without adenopathy or JVD. Lungs are clear without rales, wheezes, or rhonchi. Heart has a regular rate and rhythm with a harsh 3/6 systolic ejection murmur heard best over the cardiac apex. Abdomen is soft, flat, nontender without masses or hepatosplenomegaly. Extremities have no cyanosis or edema, full range of motion is present. Skin is warm and dry without rash. Neurologic: Mental status is normal renal nerves are intact, there no focal motor or sensory deficits.  ED Course  Procedures (including critical care time)  Results for orders placed during the hospital encounter of 03/26/12  CBC  Component Value Range   WBC 6.4  4.0 - 10.5 (K/uL)   RBC 4.49  3.87 - 5.11 (MIL/uL)   Hemoglobin 11.6 (*) 12.0 - 15.0 (g/dL)   HCT 82.9 (*) 56.2 - 46.0 (%)   MCV 79.1  78.0 - 100.0 (fL)   MCH 25.8 (*) 26.0 - 34.0 (pg)   MCHC 32.7  30.0 - 36.0 (g/dL)   RDW 13.0  86.5 - 78.4 (%)   Platelets 265  150 - 400 (K/uL)  DIFFERENTIAL      Component Value Range   Neutrophils Relative 64  43 - 77 (%)   Neutro Abs 4.1  1.7 - 7.7 (K/uL)   Lymphocytes Relative 24  12 - 46 (%)   Lymphs Abs 1.6  0.7 - 4.0 (K/uL)   Monocytes Relative 9  3 - 12 (%)   Monocytes Absolute 0.6  0.1 - 1.0 (K/uL)   Eosinophils Relative 1  0 - 5 (%)   Eosinophils Absolute 0.1  0.0 - 0.7 (K/uL)   Basophils  Relative 1  0 - 1 (%)   Basophils Absolute 0.1  0.0 - 0.1 (K/uL)  BASIC METABOLIC PANEL      Component Value Range   Sodium 128 (*) 135 - 145 (mEq/L)   Potassium 3.6  3.5 - 5.1 (mEq/L)   Chloride 90 (*) 96 - 112 (mEq/L)   CO2 25  19 - 32 (mEq/L)   Glucose, Bld 130 (*) 70 - 99 (mg/dL)   BUN 13  6 - 23 (mg/dL)   Creatinine, Ser 6.96  0.50 - 1.10 (mg/dL)   Calcium 29.5  8.4 - 10.5 (mg/dL)   GFR calc non Af Amer 78 (*) >90 (mL/min)   GFR calc Af Amer >90  >90 (mL/min)  PROTIME-INR      Component Value Range   Prothrombin Time 12.2  11.6 - 15.2 (seconds)   INR 0.89  0.00 - 1.49   APTT      Component Value Range   aPTT 26  24 - 37 (seconds)  CARDIAC PANEL(CRET KIN+CKTOT+MB+TROPI)      Component Value Range   Total CK 121  7 - 177 (U/L)   CK, MB 4.5 (*) 0.3 - 4.0 (ng/mL)   Troponin I <0.30  <0.30 (ng/mL)   Relative Index 3.7 (*) 0.0 - 2.5   POCT I-STAT TROPONIN I      Component Value Range   Troponin i, poc 0.14 (*) 0.00 - 0.08 (ng/mL)   Comment 3            Dg Chest Portable 1 View  03/26/2012  *RADIOLOGY REPORT*  Clinical Data: Pressure, shortness of breath.  PORTABLE CHEST - 1 VIEW  Comparison: No priors.  Findings: Lung volumes are low.  There is cephalization of the pulmonary vasculature and indistinctness of the interstitial markings with multiple Kerley B lines throughout the periphery of the lungs bilaterally, compatible with mild interstitial pulmonary edema.  No definite consolidative airspace disease.  There is blunting of the left costophrenic sulcus which could suggest presence of left-sided pleural effusion, however, there are overlying old left-sided rib fractures laterally, suggesting underlying pleural scarring which is likely chronic.  Heart size is mildly enlarged. The patient is rotated to the left on today's exam, resulting in distortion of the mediastinal contours and reduced diagnostic sensitivity and specificity for mediastinal pathology.  Atherosclerotic  calcifications in the arch of the aorta.  IMPRESSION: 1.  Findings compatible with mild congestive heart failure, as above. 2.  Atherosclerosis. 3.  Multiple old healed left-sided rib fractures with probable chronic scarring of the pleura at the left base.  A small left- sided pleural effusion is difficult to exclude.  Original Report Authenticated By: Florencia Reasons, M.D.      Date: 03/26/2012  Rate: 86  Rhythm: normal sinus rhythm and premature ventricular contractions (PVC)  QRS Axis: normal  Intervals: normal  ST/T Wave abnormalities: ST depression in the inferior and anterolateral leads worrisome for ischemia or subendocardial myocardial infarction  Conduction Disutrbances:none  Narrative Interpretation: ST changes worrisome for ischemia or subendocardial myocardial infarction. When compared with ECG of 10/17/1998, ST depression is new, T wave inversions in the anterior and anterolateral leads have resolved.  Old EKG Reviewed: changes noted  She got partial relief of discomfort with one nitroglycerin with painful coming down to 3/10. She got complete relief with the second nitroglycerin. Cardiac markers were only positive for a slightly elevated CK-MB with a normal troponin. Arrangements will be made to transfer her to the cardiology service at [facility].  Case has been discussed with Dr. Daleen Squibb of [facility] who agrees to accept the patient in transfer and she will be admitted to a telemetry bed. He requests she be started on heparin for anticoagulation.  1. Chest pain   2. Hyponatremia     CRITICAL CARE Performed by: XBJYN,WGNFA   Total critical care time: 55 minutes  Critical care time was exclusive of separately billable procedures and treating other patients.  Critical care was necessary to treat or prevent imminent or life-threatening deterioration.  Critical care was time spent personally by me on the following activities: development of treatment plan  with patient and/or surrogate as well as nursing, discussions with consultants, evaluation of patient's response to treatment, examination of patient, obtaining history from patient or surrogate, ordering and performing treatments and interventions, ordering and review of laboratory studies, ordering and review of radiographic studies, pulse oximetry and re-evaluation of patient's condition.   MDM  Chest pain very worrisome for acute coronary syndrome. EKG has ischemic looking ST depression in the inferior and anterolateral leads. There is no ST elevation to qualify as a code STEMI. She will be given aspirin and nitroglycerin and reassess.        Dione Booze, MD 03/26/12 0730  Repeat ECG once she became papin-free:   Date: 03/26/2012  Rate: 72  Rhythm: normal sinus rhythm and premature ventricular contractions (PVC)  QRS Axis: normal  Intervals: normal  ST/T Wave abnormalities: nonspecific T wave changes  Conduction Disutrbances:none  Narrative Interpretation: Nonspecific T wave flattening. When compared with ECG from earlier this morning, ST depression has resolved.  Old EKG Reviewed: changes noted    Dione Booze, MD 03/26/12 0800

## 2012-03-27 ENCOUNTER — Other Ambulatory Visit: Payer: Self-pay

## 2012-03-27 ENCOUNTER — Encounter (HOSPITAL_COMMUNITY)
Admission: EM | Disposition: A | Discharge status: Skilled Nursing Facility | Payer: Self-pay | Source: Home / Self Care | Attending: Cardiothoracic Surgery

## 2012-03-27 ENCOUNTER — Encounter (HOSPITAL_COMMUNITY): Payer: Self-pay | Admitting: Cardiology

## 2012-03-27 DIAGNOSIS — I059 Rheumatic mitral valve disease, unspecified: Secondary | ICD-10-CM

## 2012-03-27 DIAGNOSIS — I2510 Atherosclerotic heart disease of native coronary artery without angina pectoris: Secondary | ICD-10-CM

## 2012-03-27 HISTORY — PX: LEFT HEART CATHETERIZATION WITH CORONARY ANGIOGRAM: SHX5451

## 2012-03-27 LAB — CBC
Hemoglobin: 9.7 g/dL — ABNORMAL LOW (ref 12.0–15.0)
MCH: 25.3 pg — ABNORMAL LOW (ref 26.0–34.0)
MCV: 78.6 fL (ref 78.0–100.0)
RBC: 3.84 MIL/uL — ABNORMAL LOW (ref 3.87–5.11)

## 2012-03-27 LAB — SURGICAL PCR SCREEN
MRSA, PCR: NEGATIVE
Staphylococcus aureus: POSITIVE — AB

## 2012-03-27 SURGERY — LEFT HEART CATHETERIZATION WITH CORONARY ANGIOGRAM
Anesthesia: LOCAL

## 2012-03-27 MED ORDER — PANTOPRAZOLE SODIUM 40 MG PO TBEC
40.0000 mg | DELAYED_RELEASE_TABLET | Freq: Every day | ORAL | Status: DC
  Administered 2012-03-27 – 2012-04-02 (×7): 40 mg via ORAL
  Filled 2012-03-27 (×7): qty 1

## 2012-03-27 MED ORDER — HEPARIN (PORCINE) IN NACL 100-0.45 UNIT/ML-% IJ SOLN
900.0000 [IU]/h | INTRAMUSCULAR | Status: DC
  Administered 2012-03-27: 900 [IU]/h via INTRAVENOUS
  Filled 2012-03-27: qty 250

## 2012-03-27 MED ORDER — ASPIRIN 81 MG PO CHEW
324.0000 mg | CHEWABLE_TABLET | ORAL | Status: AC
  Administered 2012-03-27: 324 mg via ORAL
  Filled 2012-03-27: qty 4

## 2012-03-27 MED ORDER — FENTANYL CITRATE 0.05 MG/ML IJ SOLN
INTRAMUSCULAR | Status: AC
  Filled 2012-03-27: qty 2

## 2012-03-27 MED ORDER — SODIUM CHLORIDE 0.9 % IV SOLN: 250.0000 mL | INTRAVENOUS | Status: DC | PRN

## 2012-03-27 MED ORDER — SODIUM CHLORIDE 0.9 % IJ SOLN: 3.0000 mL | INTRAMUSCULAR | Status: DC | PRN

## 2012-03-27 MED ORDER — ONDANSETRON HCL 4 MG/2ML IJ SOLN: 4.0000 mg | Freq: Four times a day (QID) | INTRAMUSCULAR | Status: DC | PRN

## 2012-03-27 MED ORDER — SODIUM CHLORIDE 0.9 % IV SOLN: INTRAVENOUS | Status: AC

## 2012-03-27 MED ORDER — HEPARIN (PORCINE) IN NACL 2-0.9 UNIT/ML-% IJ SOLN
INTRAMUSCULAR | Status: AC
  Filled 2012-03-27: qty 2000

## 2012-03-27 MED ORDER — IRBESARTAN 75 MG PO TABS
75.0000 mg | ORAL_TABLET | Freq: Every day | ORAL | Status: DC
  Administered 2012-03-27 – 2012-03-28 (×2): 75 mg via ORAL
  Filled 2012-03-27 (×4): qty 1

## 2012-03-27 MED ORDER — SODIUM CHLORIDE 0.9 % IJ SOLN: 3.0000 mL | Freq: Two times a day (BID) | INTRAMUSCULAR | Status: DC

## 2012-03-27 MED ORDER — SODIUM CHLORIDE 0.9 % IV SOLN: INTRAVENOUS | Status: DC

## 2012-03-27 MED ORDER — LIDOCAINE HCL (PF) 1 % IJ SOLN
INTRAMUSCULAR | Status: AC
  Filled 2012-03-27: qty 30

## 2012-03-27 MED ORDER — FLUOXETINE HCL 20 MG PO CAPS
20.0000 mg | ORAL_CAPSULE | Freq: Every day | ORAL | Status: DC
  Administered 2012-03-27 – 2012-03-28 (×2): 20 mg via ORAL
  Filled 2012-03-27 (×4): qty 1

## 2012-03-27 MED ORDER — MIDAZOLAM HCL 2 MG/2ML IJ SOLN
INTRAMUSCULAR | Status: AC
  Filled 2012-03-27: qty 2

## 2012-03-27 MED ORDER — ACETAMINOPHEN 325 MG PO TABS
650.0000 mg | ORAL_TABLET | ORAL | Status: DC | PRN
  Administered 2012-03-28: 650 mg via ORAL
  Filled 2012-03-27: qty 2

## 2012-03-27 MED ORDER — NITROGLYCERIN 0.2 MG/ML ON CALL CATH LAB
INTRAVENOUS | Status: AC
  Filled 2012-03-27: qty 1

## 2012-03-27 NOTE — Progress Notes (Signed)
Stable post cath.  Wrist looks good. Awaiting TCTS consult.   Will resume UFH later tonight per pharmacy consult.    Jahnavi Muratore\5:00 PM 03/27/2012

## 2012-03-27 NOTE — Progress Notes (Signed)
ANTICOAGULATION CONSULT NOTE - Follow Up Consult  Pharmacy Consult for Heparin Indication: CAD  Allergies  Allergen Reactions  . Lisinopril Anaphylaxis  . Quinine Other (See Comments)    Pt states heart attack-like symptoms  . Sulfonamide Derivatives Other (See Comments)    unknown    Patient Measurements: Height: 5\' 8"  (172.7 cm) Weight: 154 lb 15.7 oz (70.3 kg) IBW/kg (Calculated) : 63.9  Heparin dosing wt: 70kg   Vital Signs: Temp: 97.6 F (36.4 C) (04/01 1126) Temp src: Oral (04/01 1126) BP: 124/72 mmHg (04/01 1126) Pulse Rate: 63  (04/01 1126)  Labs:  Basename 03/27/12 0510 03/26/12 1647 03/26/12 0632  HGB 9.7* -- 11.6*  HCT 30.2* -- 35.5*  PLT 222 -- 265  APTT -- -- 26  LABPROT -- -- 12.2  INR -- -- 0.89  HEPARINUNFRC 0.49 1.11* --  CREATININE -- -- 0.77  CKTOTAL -- -- 121  CKMB -- -- 4.5*  TROPONINI -- -- <0.30   Estimated Creatinine Clearance: 58.5 ml/min (by C-G formula based on Cr of 0.77).   Assessment: Pt with NSTEMI s/p cardiac cath today. Noted to have 3V CAD and cardiac surgery consulted. Orders to restart heparin 8 hrs after sheath removed (7pm). Heparin drip was therapeutic on 900 units/hr pre-cath. Slight drop in Hg since admission.   Goal of Therapy:  Heparin level 0.3-0.5 per Dr Riley Kill   Plan:   1) Restart heparin drip 900 uts/hr at 7pm today 2) Check 8 hr heparin level and CBC 3) Routine Hg, Pltc monitoring per protocol  Elson Clan 03/27/2012,12:36 PM

## 2012-03-27 NOTE — CV Procedure (Signed)
   Cardiac Catheterization Procedure Note  Name: [name] MRN: [id number] DOB: May 01, 1932  Procedure: Left Heart Cath, Selective Coronary Angiography, LV angiography  Indication: unstable angina   Procedural Details: The left wrist was prepped, draped, and anesthetized with 1% lidocaine. Using the modified Seldinger technique, a 5 French sheath was introduced into the left radial artery. 2.5mg  mg of nicardiipine was administered through the sheath, weight-based unfractionated heparin was administered intravenously. Standard Judkins catheters were used for selective coronary angiography and left ventriculography. Catheter exchanges were performed over an exchange length guidewire. There were no immediate procedural complications. A TR band was used for radial hemostasis at the completion of the procedure.  The patient was transferred to the post catheterization recovery area for further monitoring.  Procedural Findings: Hemodynamics: AO 106/55 (76) LV 115/15   25  Coronary angiography: Coronary dominance: right  Left mainstem: Long and tapers distally with about 20% narrowing  Left anterior descending (LAD): Totally occluded proximal to stent site and just beyond diagonal.  The diagonal has an 80% occlusion. The distal LAD is collateralized both from the RCA and with bridging collaterals.  The diagonal is large and is suitable for a grafting procedure.    Left circumflex (LCx): The AV circumflex comes off after a long left main, and with a steep bend.  The vessel has 95% narrowing and is hazy, likely the culprit.  Distally there are tandem lesions of 80% leading to a large marginal.  The first OM has modest irregularity with probably 50% segmental plaque proximally, but not critical.     Right coronary artery (RCA): Heavily calcified.  There is an 75% proximal lesion, 80% prox mid lesion, and diffuse disease in the distal portion of the mid vessel.  After the acute marginal, there is  subtotal RCA prior to the PDA which has collateral flow.  The PDA and PLA are both large.  The LAD fills by collaterals.   Left ventriculography: Left ventricular systolic function is low normal, LVEF is estimated at 50%.  There is at least moderate MR seen in both the LAO and RAO projections.  There is modest hypokinesis at the inferior base.     Final Conclusions:   1.  Severe three vessel CAD 2.  Mild reduced LV function with moderate MR  Recommendations:  1.  2D echo 2.  TCTS consult.  3.  May need RHC if surgical candidate.   [name] 03/27/2012, 9:04 AM

## 2012-03-27 NOTE — Interval H&P Note (Signed)
History and Physical Interval Note:  03/27/2012 8:03 AM  [name]  has presented today for surgery, with the diagnosis of cp  The various methods of treatment have been discussed with the patient.  Her family is not available.  She does have microcytic anemia. . After consideration of risks, benefits and other options for treatment, the patient has consented to  Procedure(s) (LRB): LEFT HEART CATHETERIZATION WITH CORONARY ANGIOGRAM (N/A) as a surgical intervention .  The patients' history has been reviewed, patient examined, no change in status, stable for surgery.  I have reviewed the patients' chart and labs.  Questions were answered to the patient's satisfaction.     [name]

## 2012-03-27 NOTE — H&P (View-Only) (Signed)
Patient ID: [name], female   DOB: 07/04/1932, 78 y.o.   MRN: [id number]   Patient ID: [name] MRN: [id number], DOB/AGE: 02/20/1932   Admit date: 03/26/2012   Primary Physician: DONDIEGO,RICHARD M, MD, MD Primary Cardiologist: none  Pt. Profile:  [name] is a 78-year-old married white female who presents with acute coronary syndrome.  Problem List  Past Medical History  Diagnosis Date  . Diabetes mellitus   . Hypertension   . Depression     History reviewed. No pertinent past surgical history.   Allergies  Allergies  Allergen Reactions  . Lisinopril   . Quinine   . Sulfonamide Derivatives     HPI She has a history of coronary disease with remote stenting in 1998. Records not available.  She woke at 3 AM this morning with substernal chest pressure going up into the right shoulder and neck and into the back. It waxed and waned until about 6 AM. He came into the ED in [place]. EKG showed ST segment depression inferolaterally. Discomfort resolved with nitroglycerin. First enzyme now positive at 0.14. EKG here and a repeat EKG there showed resolution of ST segment changes. Other blood work unremarkable except for slight anemia, sodium 128, and low ferritin. Chest x-ray showed some vascular congestion.  She quit smoking years ago. She has hypertension and diabetes.  Home Medications  Prior to Admission medications   Medication Sig Start Date End Date Taking? Authorizing Provider  FLUoxetine (PROZAC) 20 MG tablet Take 20 mg by mouth daily.   Yes Historical Provider, MD  olmesartan (BENICAR) 40 MG tablet Take 40 mg by mouth daily.   Yes Historical Provider, MD  omeprazole (PRILOSEC) 20 MG capsule Take 20 mg by mouth daily.   Yes Historical Provider, MD  sitaGLIPtan-metformin (JANUMET) 50-500 MG per tablet Take 1 tablet by mouth 2 (two) times daily with a meal.   Yes Historical Provider, MD    Family History  No premature history of coronary disease. There  is history of hypertension and diabetes. Social History  Social history: She lives in [place] and is married. She is a former smoker. She does not use alcohol or drugs.   Past medical history she has for diabetes type 2, block,, hypertension,history of diverticulitis history of DJD, history of hemorrhoids, depression.  Allergies lisinopril, quinine, sulfonamide derivatives  Review of Systems General:  No chills, fever, night sweats or weight changes.  Cardiovascular:  No chest pain, dyspnea on exertion, edema, orthopnea, palpitations, paroxysmal nocturnal dyspnea. Dermatological: No rash, lesions/masses Respiratory: No cough, dyspnea Urologic: No hematuria, dysuria Abdominal:   No nausea, vomiting, diarrhea, bright red blood per rectum, melena, or hematemesis Neurologic:  No visual changes, wkns, changes in mental status. All other systems reviewed and are otherwise negative except as noted above.  Physical Exam  Blood pressure 138/80, pulse 72, temperature 98.2 F (36.8 C), temperature source Oral, resp. rate 18, height 5' 8" (1.727 m), weight 154 lb 15.7 oz (70.3 kg), SpO2 92.00%.  General: Pleasant, NAD.  Psych: Normal affect. Neuro: Alert and oriented X 3. Moves all extremities spontaneously. HEENT: Normal  Neck: Supple without bruits or JVD. Lungs:  Resp regular and unlabored, CTA. Heart: RRR no s3, s4, soft systolic murmur, S2 splits Abdomen: Soft, non-tender, non-distended, BS + x 4.  Extremities: No clubbing, cyanosis or edema. DP/PT/Radials 2+ and equal bilaterally.  Labs   Basename 03/26/12 0632  CKTOTAL 121  CKMB 4.5*  TROPONINI <0.30   Lab Results    Component Value Date   WBC 6.4 03/26/2012   HGB 11.6* 03/26/2012   HCT 35.5* 03/26/2012   MCV 79.1 03/26/2012   PLT 265 03/26/2012    Lab 03/26/12 0632  NA 128*  K 3.6  CL 90*  CO2 25  BUN 13  CREATININE 0.77  CALCIUM 10.2  PROT --  BILITOT --  ALKPHOS --  ALT --  AST --  GLUCOSE 130*   No results  found for this basename: CHOL, HDL, LDLCALC, TRIG   No results found for this basename: DDIMER     Radiology/Studies  Dg Chest Portable 1 View  03/26/2012  *RADIOLOGY REPORT*  Clinical Data: Pressure, shortness of breath.  PORTABLE CHEST - 1 VIEW  Comparison: No priors.  Findings: Lung volumes are low.  There is cephalization of the pulmonary vasculature and indistinctness of the interstitial markings with multiple Kerley B lines throughout the periphery of the lungs bilaterally, compatible with mild interstitial pulmonary edema.  No definite consolidative airspace disease.  There is blunting of the left costophrenic sulcus which could suggest presence of left-sided pleural effusion, however, there are overlying old left-sided rib fractures laterally, suggesting underlying pleural scarring which is likely chronic.  Heart size is mildly enlarged. The patient is rotated to the left on today's exam, resulting in distortion of the mediastinal contours and reduced diagnostic sensitivity and specificity for mediastinal pathology.  Atherosclerotic calcifications in the arch of the aorta.  IMPRESSION: 1.  Findings compatible with mild congestive heart failure, as above. 2.  Atherosclerosis. 3.  Multiple old healed left-sided rib fractures with probable chronic scarring of the pleura at the left base.  A small left- sided pleural effusion is difficult to exclude.  Original Report Authenticated By: DANIEL W. ENTRIKIN, M.D.    ECG  Serial EKGs reviewed. Last EKG showed resolution of ST segment depression inferolaterally. This one done at this hospital   ASSESSMENT AND PLAN  [name] is a 78-year-old married white female with a history of coronary disease and stenting. He presents with an acute coronary syndrome and was ruled in for a NSTEMI. Will treat with intravenous heparin and begin nitroglycerin paste. We'll initiate aspirin and low-dose beta blocker. Cycle enzymes. Check fasting lipids in the morning  since not on a statin. We'll hold n.p.o. for cardiac cath tomorrow.   Signed, Zebulon Gantt, MD 03/26/2012, @[facility]  

## 2012-03-27 NOTE — Progress Notes (Signed)
78 y/o active female w/ subendocardial MI, severe 3v CAD, EF.35 and mild MR She would benefit from CABG and will schedule asap but will be a few days.  Past hx --DM, L thorocotomy for rib resection, s/p chole and CBD exploration  will follow

## 2012-03-28 ENCOUNTER — Other Ambulatory Visit: Payer: Self-pay

## 2012-03-28 ENCOUNTER — Inpatient Hospital Stay (HOSPITAL_COMMUNITY): Payer: Medicare Other

## 2012-03-28 DIAGNOSIS — I340 Nonrheumatic mitral (valve) insufficiency: Secondary | ICD-10-CM | POA: Diagnosis present

## 2012-03-28 DIAGNOSIS — I2510 Atherosclerotic heart disease of native coronary artery without angina pectoris: Secondary | ICD-10-CM

## 2012-03-28 DIAGNOSIS — R079 Chest pain, unspecified: Secondary | ICD-10-CM

## 2012-03-28 DIAGNOSIS — I059 Rheumatic mitral valve disease, unspecified: Secondary | ICD-10-CM

## 2012-03-28 DIAGNOSIS — Z01810 Encounter for preprocedural cardiovascular examination: Secondary | ICD-10-CM

## 2012-03-28 LAB — COMPREHENSIVE METABOLIC PANEL
ALT: 10 U/L (ref 0–35)
AST: 17 U/L (ref 0–37)
Albumin: 3.2 g/dL — ABNORMAL LOW (ref 3.5–5.2)
Alkaline Phosphatase: 58 U/L (ref 39–117)
BUN: 9 mg/dL (ref 6–23)
CO2: 23 mEq/L (ref 19–32)
Calcium: 8.5 mg/dL (ref 8.4–10.5)
Chloride: 100 mEq/L (ref 96–112)
Creatinine, Ser: 0.65 mg/dL (ref 0.50–1.10)
GFR calc Af Amer: >90 mL/min (ref >90)
GFR calc non Af Amer: 83 mL/min — ABNORMAL LOW (ref >90)
Glucose, Bld: 132 mg/dL — ABNORMAL HIGH (ref 70–99)
Potassium: 3.7 mEq/L (ref 3.5–5.1)
Sodium: 132 mEq/L — ABNORMAL LOW (ref 135–145)
Total Bilirubin: 0.2 mg/dL — ABNORMAL LOW (ref 0.3–1.2)
Total Protein: 6 g/dL (ref 6.0–8.3)

## 2012-03-28 LAB — URINALYSIS, ROUTINE W REFLEX MICROSCOPIC
Bilirubin Urine: NEGATIVE
Glucose, UA: NEGATIVE mg/dL
Hgb urine dipstick: NEGATIVE
Ketones, ur: NEGATIVE mg/dL
Nitrite: NEGATIVE
Protein, ur: NEGATIVE mg/dL
Specific Gravity, Urine: 1.02 (ref 1.005–1.030)
Urobilinogen, UA: 0.2 mg/dL (ref 0.0–1.0)
pH: 6.5 (ref 5.0–8.0)

## 2012-03-28 LAB — CBC
HCT: 30.9 % — ABNORMAL LOW (ref 36.0–46.0)
MCH: 25 pg — ABNORMAL LOW (ref 26.0–34.0)
MCV: 78.8 fL (ref 78.0–100.0)
Platelets: 217 10*3/uL (ref 150–400)
RBC: 3.92 MIL/uL (ref 3.87–5.11)
RDW: 14.9 % (ref 11.5–15.5)
WBC: 6.4 10*3/uL (ref 4.0–10.5)

## 2012-03-28 LAB — PULMONARY FUNCTION TEST

## 2012-03-28 LAB — URINE MICROSCOPIC-ADD ON

## 2012-03-28 LAB — HEPARIN LEVEL (UNFRACTIONATED): Heparin Unfractionated: 0.21 IU/mL — ABNORMAL LOW (ref 0.30–0.70)

## 2012-03-28 LAB — HEMOGLOBIN A1C
Hgb A1c MFr Bld: 6.9 % — ABNORMAL HIGH (ref <5.7)
Mean Plasma Glucose: 151 mg/dL — ABNORMAL HIGH (ref <117)

## 2012-03-28 LAB — TSH: TSH: 3.392 u[IU]/mL (ref 0.350–4.500)

## 2012-03-28 MED ORDER — MUPIROCIN CALCIUM 2 % EX CREA
TOPICAL_CREAM | Freq: Two times a day (BID) | CUTANEOUS | Status: DC
  Administered 2012-03-29: 10:00:00 via TOPICAL
  Filled 2012-03-28: qty 15

## 2012-03-28 MED ORDER — HEPARIN (PORCINE) IN NACL 100-0.45 UNIT/ML-% IJ SOLN
1200.0000 [IU]/h | INTRAMUSCULAR | Status: DC
  Administered 2012-03-28: 1250 [IU]/h via INTRAVENOUS
  Administered 2012-03-29: 1200 [IU]/h via INTRAVENOUS
  Filled 2012-03-28 (×5): qty 250

## 2012-03-28 MED ORDER — ALBUTEROL SULFATE (5 MG/ML) 0.5% IN NEBU
2.5000 mg | INHALATION_SOLUTION | Freq: Once | RESPIRATORY_TRACT | Status: AC
  Administered 2012-03-28: 2.5 mg via RESPIRATORY_TRACT

## 2012-03-28 MED FILL — Nicardipine HCl IV Soln 2.5 MG/ML: INTRAVENOUS | Qty: 1 | Status: AC

## 2012-03-28 NOTE — Progress Notes (Signed)
CARDIAC REHAB PHASE I   PRE:  Rate/Rhythm: 90 SR    BP: sitting 120/76    SaO2: 95 RA  MODE:  Ambulation: 450 ft   POST:  Rate/Rhythm: 100 ST    BP: sitting 134/84     SaO2: 96 RA  Pt wobbly without RW. Sts she is better with shoes due to neuropathy. Used RW, assist x1. Tolerated fairly well. No sx of angina. To chair. Instructed on sitting up, IS and walking daily. Only inhaling 750 cc on IS. Will f/u. [phone]  Harriet Masson CES, ACSM

## 2012-03-28 NOTE — Progress Notes (Signed)
Subjective:  Stable post cath. Wrist without hematoma.  Seen by TCTS and awaiting surgery.  Needs echo and may need further evaluation of MV.    Objective:  Vital Signs in the last 24 hours: Temp:  [97.8 F (36.6 C)-99.3 F (37.4 C)] 97.8 F (36.6 C) (04/02 1433) Pulse Rate:  [63-69] 68  (04/02 1433) Resp:  [18-20] 18  (04/02 1433) BP: (129-145)/(72-73) 145/72 mmHg (04/02 1433) SpO2:  [92 %-95 %] 95 % (04/02 1433)  Intake/Output from previous day: 04/01 0701 - 04/02 0700 In: 117 [I.V.:117] Out: 450 [Urine:450]   Physical Exam: General: Well developed, well nourished, in no acute distress. Head:  Normocephalic and atraumatic. Lungs: Clear to auscultation and percussion. Wrist without hematoma. No edema.     Lab Results:  [facility] 03/28/12 0633 03/27/12 0510  WBC 6.4 5.7  HGB 9.8* 9.7*  PLT 217 222    Basename 03/28/12 0246 03/26/12 0632  NA 132* 128*  K 3.7 3.6  CL 100 90*  CO2 23 25  GLUCOSE 132* 130*  BUN 9 13  CREATININE 0.65 0.77    Basename 03/26/12 0632  TROPONINI <0.30   Hepatic Function Panel  Basename 03/28/12 0246  PROT 6.0  ALBUMIN 3.2*  AST 17  ALT 10  ALKPHOS 58  BILITOT 0.2*  BILIDIR --  IBILI --   No results found for this basename: CHOL in the last 72 hours No results found for this basename: PROTIME in the last 72 hours  Imaging: Dg Chest 2 View  03/28/2012  *RADIOLOGY REPORT*  Clinical Data: Preop CABG.  CHEST - 2 VIEW  Comparison: 03/26/2012  Findings: Mild cardiomegaly.  Small bilateral pleural effusions. Diffuse interstitial prominence slightly improved, likely mild residual interstitial edema versus chronic interstitial lung disease.  Chronic post-traumatic or postoperative deformity of the left hemithorax.  IMPRESSION: Improving but persistent interstitial prominence, likely interstitial edema.  This could also reflect underlying chronic interstitial lung disease.  Small effusions.  Original Report Authenticated By: Cyndie Chime, M.D.      Cardiac Studies:  ECHO  Study Conclusions  - Left ventricle: Akinesis of apical septal and apical lateral and apical anterior segments. Hypokinesis of inferior wall . The cavity size was normal. Wall thickness was increased in a pattern of mild LVH. The estimated ejection fraction was 35%. - Mitral valve: Mild regurgitation. - Left atrium: The atrium was mildly dilated. - Right atrium: The atrium was mildly dilated. - Pulmonary arteries: PA peak pressure: 37mm Hg (S).    Assessment/Plan:  CAD---severe    TCTS consult.  Has MR but mild by echo, EF worse by echo than cath suggests, and some MR.  Should likely have intraoperative TEE done to assess.  Timing of surgery up in the air at the present time.  Back on low dose heparin at present.  Anemia    Microcytic.  Will check stools and get iron studies.        Alison Pons, MD, [facility], FSCAI 03/28/2012, 2:41 PM

## 2012-03-28 NOTE — Consult Note (Signed)
[name], [name] NO.:  [id number]  MEDICAL RECORD NO.:  [id number]  LOCATION:  2032                         FACILITY:  [facility]  PHYSICIAN:  Kerin Perna, M.D.  DATE OF BIRTH:  [birth year]  DATE OF CONSULTATION:  03/27/2012 DATE OF DISCHARGE:                                CONSULTATION   PHYSICIAN REQUESTING CONSULTATION:  Jesse Sans. Daleen Squibb, MD, [facility]  PRIMARY CARE PHYSICIAN:  Melvyn Novas, MD in [place].  REASON FOR CONSULTATION:  Subendocardial MI with severe three-vessel coronary artery disease, EF 35%.  HISTORY OF PRESENT ILLNESS:  I was asked to evaluate this 78 year old Caucasian female, diabetic, ex-smoker for possible multivessel bypass grafting.  She was recently admitted to the hospital with substernal chest pain, which woke her at 3 a.m. and was associated with ST-segment depression.  Cardiac enzymes were mildly positive and she was admitted to the hospital for further evaluation.  A 2D echo showed EF of 35% with mild MR.  Cardiac catheterizations performed today by Dr. Riley Kill demonstrated EF of 35%, inferior wall hypokinesia, LVEDP 25, occlusion of a previous LAD stent with collateralization of the distal LAD, high- grade stenosis of the circumflex system and the right coronary artery. Chest x-ray shows some interstitial edema, but otherwise no mass or nodularity.  Blood work so far is unremarkable with normalized creatinine and hemoglobin low at 11.6 and sodium low at 128.  She is felt to be a potential candidate for the multivessel bypass grafting based on her 3-vessel disease, diabetes, and acute unstable anginal presentation.  She is currently stable on IV heparin.  PAST MEDICAL HISTORY: 1. Diabetes type 2. 2. Hypertension. 3. Diverticulitis. 4. Status post cholecystectomy and bile duct exploration. 5. Depression. 6. History of left rib resection for mandibular reconstruction.  HOME MEDICATIONS:  Benicar, Prilosec,  Janumet, Prozac, aspirin.  ALLERGIES:  LISINOPRIL, CLONIDINE, SULFONAMIDE DERIVATIVES.  SOCIAL HISTORY:  The patient lives at home with her husband and is very active.  She does not smoke or use alcohol.  FAMILY HISTORY:  Father lived at age 93 and had bypass surgery at age 35.  Positive family history for diabetes.  REVIEW OF SYSTEMS:  Constitutional review is negative for fever or weight loss.  HEENT review is negative for dental complaints or difficulty swallowing.  Thoracic review is negative for history of thoracic trauma or the rib resection.  No recent upper respiratory infections.  Cardiac review is positive for previous stent to her LAD 10 years ago.  No heart failure symptoms.  GI review is negative for recent pain, jaundice or blood per rectum.  Endocrine review is positive for diabetes with A1c pending.  Vascular review is negative for DVT, claudication, TIA.  Neurologic review is negative for stroke or seizure. Hematologic review is negative for bleeding or blood transfusion.  PHYSICAL EXAMINATION:  VITAL SIGNS:  The patient is 5 feet and 8 inches, weighs 154 pounds.  Blood pressure 135/78, pulse 72, sinus. GENERAL APPEARANCE:  Is that of a pleasant 78 year old Caucasian female in her hospital room accompanied by daughter and granddaughter.  No acute distress. HEENT:  Normocephalic.  Dentition good. NECK:  Without JVD, mass or bruit.  LYMPHATICS:  Reveal no palpable adenopathy in the neck or supraclavicular fossa. CHEST:  Without deformity.  Well-healed left thoracotomy scar.  Breath sounds clear bilaterally. CARDIAC:  Regular rhythm without murmur or gallop. ABDOMEN:  Soft and nontender without pulsatile mass. EXTREMITIES:  Reveal no clubbing, cyanosis, or edema.  Pedal pulses are not palpable.  She has a compression dressing over her left radial artery. NEUROLOGIC:  Alert and oriented without focal motor deficit.  LABORATORY DATA:  Hemoglobin 11.6, white count  6.4.  Sodium 128, creatinine 0.8.  Chest x-ray; interstitial edema.  RECOMMENDATION:  The patient would be benefited from multivessel bypass grafting, although at age 44, she would face some increase risk.  Her best long-term treatment of her severe CAD is surgical revascularization.  I discussed the procedure in detail with the patient and her family and she is interested in proceeding with surgery.  We will proceed with preoperative evaluation and plan on scheduling her as soon as possible.  Thank you for the consultation.     Kerin Perna, M.D.     PV/MEDQ  D:  03/27/2012  T:  03/28/2012  Job:  [id number]

## 2012-03-28 NOTE — Progress Notes (Signed)
UR COMPLETED  

## 2012-03-28 NOTE — Progress Notes (Signed)
ANTICOAGULATION CONSULT NOTE - Follow Up Consult  Pharmacy Consult for Heparin Indication: CAD  Allergies  Allergen Reactions  . Lisinopril Anaphylaxis  . Quinine Other (See Comments)    Pt states heart attack-like symptoms  . Sulfonamide Derivatives Other (See Comments)    unknown    Patient Measurements: Height: 5\' 8"  (172.7 cm) Weight: 154 lb 15.7 oz (70.3 kg) IBW/kg (Calculated) : 63.9  Heparin dosing wt: 70kg   Vital Signs: Temp: 99.3 F (37.4 C) (04/02 0525) Temp src: Oral (04/02 0525) BP: 130/72 mmHg (04/02 0525) Pulse Rate: 69  (04/02 0525)  Labs:  Basename 03/28/12 1110 03/28/12 4540 03/28/12 0246 03/27/12 0510 03/26/12 0632  HGB -- 9.8* -- 9.7* --  HCT -- 30.9* -- 30.2* 35.5*  PLT -- 217 -- 222 265  APTT -- -- -- -- 26  LABPROT -- -- -- -- 12.2  INR -- -- -- -- 0.89  HEPARINUNFRC 0.15* -- 0.21* 0.49 --  CREATININE -- -- 0.65 -- 0.77  CKTOTAL -- -- -- -- 121  CKMB -- -- -- -- 4.5*  TROPONINI -- -- -- -- <0.30   Estimated Creatinine Clearance: 58.5 ml/min (by C-G formula based on Cr of 0.65).   Assessment: Pt with NSTEMI s/p cardiac cath 03/27/12 currently undergoing work-up for CABG. Heparin level remains sub-therapeutic despite rate increase.  Pt currently off the floor for procedure, however RN reports heparin drip infusing appropriately without issues.  No bleeding noted.  Goal of Therapy:  Heparin level 0.3-0.5 per Dr Riley Kill   Plan:   1) Increase heparin drip to 1250units/hr 2) Check 8 hr heparin level 3) Routine Hg, Pltc monitoring per protocol   Junita Push, PharmD, BCPS 03/28/2012 1:28 PM

## 2012-03-28 NOTE — Progress Notes (Signed)
Pre-op Cardiac Surgery   Carotid Findings:  No evidence of internal carotid artery stenosis bilaterally. Antegrade bilateral vertebral arteries.    Upper Extremity Right Left  Brachial Pressures 127 133  Radial Waveforms Biphasic Biphasic  Ulnar Waveforms Triphasic Biphasic  Palmar Arch (Allen's Test) Signal obliterates with radial compression, remains unchanged with ulnar compression. Signal obliterates with radial compression, remains unchanged with ulnar compression.   Findings:      Lower  Extremity Right Left  Dorsalis Pedis Biphasic Triphasic  Anterior Tibial    Posterior Tibial Monophasic Biphasic  Ankle/Brachial Indices 1.11 1.06    Findings:   Carotid duplex reveals no evidence of internal carotid artery stenosis bilaterally. Bilateral antegrade vertebral arteries. Bilateral normal ABIs. Allen's test reveals obliterated doppler signals with radial compression and unchanged signals with ulnar compression bilaterally.

## 2012-03-28 NOTE — Progress Notes (Signed)
ANTICOAGULATION CONSULT NOTE - Follow Up Consult  Pharmacy Consult for Heparin Indication: CAD  Allergies  Allergen Reactions  . Lisinopril Anaphylaxis  . Quinine Other (See Comments)    Pt states heart attack-like symptoms  . Sulfonamide Derivatives Other (See Comments)    unknown    Patient Measurements: Height: 5\' 8"  (172.7 cm) Weight: 154 lb 15.7 oz (70.3 kg) IBW/kg (Calculated) : 63.9  Heparin dosing wt: 70kg   Vital Signs: Temp: 98.1 F (36.7 C) (04/01 2107) Temp src: Oral (04/01 2107) BP: 129/73 mmHg (04/01 2107) Pulse Rate: 63  (04/01 2107)  Labs:  Basename 03/28/12 0246 03/27/12 0510 03/26/12 1647 03/26/12 0632  HGB -- 9.7* -- 11.6*  HCT -- 30.2* -- 35.5*  PLT -- 222 -- 265  APTT -- -- -- 26  LABPROT -- -- -- 12.2  INR -- -- -- 0.89  HEPARINUNFRC 0.21* 0.49 1.11* --  CREATININE 0.65 -- -- 0.77  CKTOTAL -- -- -- 121  CKMB -- -- -- 4.5*  TROPONINI -- -- -- <0.30   Estimated Creatinine Clearance: 58.5 ml/min (by C-G formula based on Cr of 0.65).   Assessment: Pt with NSTEMI s/p cardiac cath today. Noted to have 3V CAD and cardiac surgery consulted. Heparin was restarted 8 hrs after sheath removed (7pm).  Heparin level currently slightly less than goal.  No complications noted.   Goal of Therapy:  Heparin level 0.3-0.5 per Dr Riley Kill   Plan:   1) Increase heparin drip to 1000 units/hr 2) Check 8 hr heparin level 3) Routine Hg, Pltc monitoring per protocol   Jill Side L. Illene Bolus, PharmD, BCPS Clinical Pharmacist Pager: [phone] 03/28/2012 3:49 AM

## 2012-03-29 ENCOUNTER — Other Ambulatory Visit: Payer: Self-pay

## 2012-03-29 DIAGNOSIS — E871 Hypo-osmolality and hyponatremia: Secondary | ICD-10-CM

## 2012-03-29 LAB — LIPID PANEL
HDL: 36 mg/dL — ABNORMAL LOW (ref >39)
LDL Cholesterol: 89 mg/dL (ref 0–99)
Triglycerides: 121 mg/dL (ref <150)
VLDL: 24 mg/dL (ref 0–40)

## 2012-03-29 LAB — HEPARIN LEVEL (UNFRACTIONATED): Heparin Unfractionated: 0.5 IU/mL (ref 0.30–0.70)

## 2012-03-29 LAB — CBC
Hemoglobin: 10.1 g/dL — ABNORMAL LOW (ref 12.0–15.0)
MCH: 26 pg (ref 26.0–34.0)
MCHC: 32.3 g/dL (ref 30.0–36.0)
Platelets: 214 10*3/uL (ref 150–400)

## 2012-03-29 MED ORDER — FUROSEMIDE 20 MG PO TABS
20.0000 mg | ORAL_TABLET | Freq: Once | ORAL | Status: AC
  Administered 2012-03-29: 20 mg via ORAL
  Filled 2012-03-29: qty 1

## 2012-03-29 MED ORDER — FLUOXETINE HCL 20 MG PO CAPS
20.0000 mg | ORAL_CAPSULE | Freq: Every day | ORAL | Status: DC
  Administered 2012-03-29 – 2012-04-17 (×19): 20 mg via ORAL
  Filled 2012-03-29 (×20): qty 1

## 2012-03-29 MED ORDER — IRBESARTAN 75 MG PO TABS: 75.0000 mg | ORAL_TABLET | Freq: Every day | ORAL | Status: DC

## 2012-03-29 MED ORDER — FLUOXETINE HCL 20 MG PO CAPS: 20.0000 mg | ORAL_CAPSULE | Freq: Every day | ORAL | Status: DC

## 2012-03-29 MED ORDER — IRBESARTAN 75 MG PO TABS
75.0000 mg | ORAL_TABLET | Freq: Every day | ORAL | Status: DC
  Administered 2012-03-29 – 2012-04-02 (×5): 75 mg via ORAL
  Filled 2012-03-29 (×6): qty 1

## 2012-03-29 NOTE — Progress Notes (Signed)
CARDIAC REHAB PHASE I   PRE:  Rate/Rhythm: 72SR  BP:  Supine:   Sitting: 120/80  Standing:    SaO2: 96%RA  MODE:  Ambulation: 550 ft   POST:  Rate/Rhythem: 69-70  BP:  Supine:   Sitting: 130/70  Standing:    SaO2: 94-97%RA 1050-1110 Pt walked 550 ft on RA with rolling walker and asst x 1. Put tennis shoes on pt prior to walk. Pt steadier and felt better walking in shoes. To sitting on side of bed after walk. Denied chest pain. Tolerated well.  Duanne Limerick

## 2012-03-29 NOTE — Progress Notes (Signed)
ANTICOAGULATION CONSULT NOTE - Follow Up Consult  Pharmacy Consult for Heparin Indication: CAD  Allergies  Allergen Reactions  . Lisinopril Anaphylaxis  . Quinine Other (See Comments)    Pt states heart attack-like symptoms  . Sulfonamide Derivatives Other (See Comments)    unknown    Patient Measurements: Height: 5\' 8"  (172.7 cm) Weight: 154 lb 15.7 oz (70.3 kg) IBW/kg (Calculated) : 63.9  Heparin dosing wt: 70kg   Vital Signs: Temp: 98.5 F (36.9 C) (04/02 2010) Temp src: Oral (04/02 2010) BP: 131/78 mmHg (04/02 2010) Pulse Rate: 87  (04/02 2010)  Labs:  Basename 03/28/12 2311 03/28/12 1110 03/28/12 1610 03/28/12 0246 03/27/12 0510 03/26/12 0632  HGB -- -- 9.8* -- 9.7* --  HCT -- -- 30.9* -- 30.2* 35.5*  PLT -- -- 217 -- 222 265  APTT -- -- -- -- -- 26  LABPROT -- -- -- -- -- 12.2  INR -- -- -- -- -- 0.89  HEPARINUNFRC 0.51 0.15* -- 0.21* -- --  CREATININE -- -- -- 0.65 -- 0.77  CKTOTAL -- -- -- -- -- 121  CKMB -- -- -- -- -- 4.5*  TROPONINI -- -- -- -- -- <0.30   Estimated Creatinine Clearance: 58.5 ml/min (by C-G formula based on Cr of 0.65).   Assessment: 78 yo female  with NSTEMI s/p cardiac cath 03/27/12 currently undergoing work-up for CABG. Heparin level is slightly above-goal.   Goal of Therapy:  Heparin level 0.3-0.5 per Dr Riley Kill   Plan:   1. Decrease IV heparin to 1200 units/hr 2. Heparin level in 8 hours.    Lorre Munroe, PharmD 03/29/2012 12:20 AM

## 2012-03-29 NOTE — Progress Notes (Signed)
ANTICOAGULATION CONSULT NOTE - Follow Up Consult  Pharmacy Consult for Heparin Indication: CAD  Allergies  Allergen Reactions  . Lisinopril Anaphylaxis  . Quinine Other (See Comments)    Pt states heart attack-like symptoms  . Sulfonamide Derivatives Other (See Comments)    unknown    Patient Measurements: Height: 5\' 8"  (172.7 cm) Weight: 154 lb 15.7 oz (70.3 kg) IBW/kg (Calculated) : 63.9  Heparin dosing wt: 70kg   Vital Signs: Temp: 97.4 F (36.3 C) (04/03 0333) Temp src: Oral (04/03 0333) BP: 132/67 mmHg (04/03 1001) Pulse Rate: 64  (04/03 1001)  Labs:  Basename 03/29/12 0830 03/28/12 2311 03/28/12 1110 03/28/12 0633 03/28/12 0246 03/27/12 0510  HGB 10.1* -- -- 9.8* -- --  HCT 31.3* -- -- 30.9* -- 30.2*  PLT 214 -- -- 217 -- 222  APTT -- -- -- -- -- --  LABPROT -- -- -- -- -- --  INR -- -- -- -- -- --  HEPARINUNFRC 0.50 0.51 0.15* -- -- --  CREATININE -- -- -- -- 0.65 --  CKTOTAL -- -- -- -- -- --  CKMB -- -- -- -- -- --  TROPONINI -- -- -- -- -- --   Estimated Creatinine Clearance: 58.5 ml/min (by C-G formula based on Cr of 0.65).   Assessment: 78 yo female  with NSTEMI s/p cardiac cath 03/27/12 currently undergoing work-up for CABG. Heparin level therapeutic at upper end of desired goal range. No bleeding noted.   Goal of Therapy:  Heparin level 0.3-0.5 per Dr Riley Kill   Plan:   1. Continue IV heparin at 1200 units/hr 2. Daily heparin level, CBC with routine platelet monitoring per protocol  Junita Push, PharmD, BCPS 03/29/2012 10:30 AM

## 2012-03-29 NOTE — Progress Notes (Signed)
Subjective:  Patient stable.  No recurrent chest pain.  Awaiting surgery on Monday.  She is agreeable with proceeding.  No shortness of breath.    Objective:  Vital Signs in the last 24 hours: Temp:  [97.4 F (36.3 C)-98.5 F (36.9 C)] 97.4 F (36.3 C) (04/03 0333) Pulse Rate:  [61-87] 64  (04/03 1001) Resp:  [18] 18  (04/03 0333) BP: (131-145)/(67-78) 132/67 mmHg (04/03 1001) SpO2:  [90 %-95 %] 91 % (04/03 0333)  Intake/Output from previous day: 04/02 0701 - 04/03 0700 In: 504 [P.O.:360; I.V.:144] Out: 350 [Urine:350]   Physical Exam: General: Well developed, well nourished, in no acute distress. Head:  Normocephalic and atraumatic. Lungs: Clear to auscultation and percussion. Heart: Normal S1 and S2.  I do not appreciate her MR.   Pulses: Pulses normal in all 4 extremities. Extremities: No clubbing or cyanosis. No edema. Neurologic: Alert and oriented x 3. Wrist without hematoma.      Lab Results:  Basename 03/29/12 0830 03/28/12 0633  WBC 5.5 6.4  HGB 10.1* 9.8*  PLT 214 217    Basename 03/28/12 0246  NA 132*  K 3.7  CL 100  CO2 23  GLUCOSE 132*  BUN 9  CREATININE 0.65   No results found for this basename: TROPONINI:2,CK,MB:2 in the last 72 hours Hepatic Function Panel  Basename 03/28/12 0246  PROT 6.0  ALBUMIN 3.2*  AST 17  ALT 10  ALKPHOS 58  BILITOT 0.2*  BILIDIR --  IBILI --    Basename 03/29/12 0515  CHOL 149   No results found for this basename: PROTIME in the last 72 hours  Imaging: Dg Chest 2 View  03/28/2012  *RADIOLOGY REPORT*  Clinical Data: Preop CABG.  CHEST - 2 VIEW  Comparison: 03/26/2012  Findings: Mild cardiomegaly.  Small bilateral pleural effusions. Diffuse interstitial prominence slightly improved, likely mild residual interstitial edema versus chronic interstitial lung disease.  Chronic post-traumatic or postoperative deformity of the left hemithorax.  IMPRESSION: Improving but persistent interstitial prominence, likely  interstitial edema.  This could also reflect underlying chronic interstitial lung disease.  Small effusions.  Original Report Authenticated By: Cyndie Chime, M.D.      Assessment/Plan:  Patient Active Hospital Problem List: DM (04/24/2009)   Assessment: glucoses are slightly elevated   Plan: monitor HYPERTENSION (04/24/2009)   Assessment: controlled.    Plan: medical  Coronary artery disease (03/28/2012)   Assessment: Plan is for CABG   Plan: Monday Mitral regurgitation (03/28/2012)   Assessment: See echo report   Plan: Discuss with PVT.   Given CXR results, I will give her a dose of furosemide. Also, 3-6 wbc per high power field.  Check urine culture.  No symptoms.        [name], MD, [facility], FSCAI 03/29/2012, 10:54 AM

## 2012-03-30 LAB — URINE CULTURE: Culture  Setup Time: 201304031451

## 2012-03-30 LAB — CBC
MCH: 25.5 pg — ABNORMAL LOW (ref 26.0–34.0)
MCHC: 32.5 g/dL (ref 30.0–36.0)
Platelets: 204 10*3/uL (ref 150–400)
RBC: 3.68 MIL/uL — ABNORMAL LOW (ref 3.87–5.11)

## 2012-03-30 LAB — HEPARIN LEVEL (UNFRACTIONATED): Heparin Unfractionated: 0.6 IU/mL (ref 0.30–0.70)

## 2012-03-30 LAB — OCCULT BLOOD X 1 CARD TO LAB, STOOL: Fecal Occult Bld: NEGATIVE

## 2012-03-30 MED ORDER — DIAZEPAM 5 MG PO TABS: 5.0000 mg | ORAL_TABLET | ORAL | Status: DC | PRN

## 2012-03-30 MED ORDER — HEPARIN (PORCINE) IN NACL 100-0.45 UNIT/ML-% IJ SOLN
1100.0000 [IU]/h | INTRAMUSCULAR | Status: DC
  Administered 2012-03-30: 1100 [IU]/h via INTRAVENOUS
  Filled 2012-03-30 (×6): qty 250

## 2012-03-30 MED ORDER — MUPIROCIN 2 % EX OINT
TOPICAL_OINTMENT | Freq: Two times a day (BID) | CUTANEOUS | Status: DC
  Administered 2012-03-30 – 2012-04-02 (×8): via NASAL

## 2012-03-30 NOTE — Progress Notes (Signed)
Cardiac Rehab 1445 Pt observed up with her daughter walking. We will followup tomorrow. Bobbyjo Marulanda DunlapRN

## 2012-03-30 NOTE — Progress Notes (Signed)
Subjective:  Awaiting surgery.    Objective:  Vital Signs in the last 24 hours: Temp:  [98.1 F (36.7 C)-98.4 F (36.9 C)] 98.4 F (36.9 C) (04/04 0532) Pulse Rate:  [55-64] 57  (04/04 0532) Resp:  [16-18] 16  (04/04 0532) BP: (115-143)/(65-79) 119/79 mmHg (04/04 0532) SpO2:  [93 %-96 %] 93 % (04/04 0532)  Intake/Output from previous day: 04/03 0701 - 04/04 0700 In: 806.4 [P.O.:720; I.V.:86.4] Out: 600 [Urine:600]   Physical Exam: General: older female in no distress Head:  Normocephalic and atraumatic. Lungs: Clear to auscultation and percussion. Heart: Normal S1 and S2.  No murmur, rubs or gallops.  Pulses: Pulses normal in all 4 extremities. Extremities: No clubbing or cyanosis. No edema. Neurologic: Alert and oriented x 3. Rectal exam:  Soft brown stool, hemoccult negative in the lab.     Lab Results:  Basename 03/30/12 0527 03/29/12 0830  WBC 6.8 5.5  HGB 9.4* 10.1*  PLT 204 214    Basename 03/28/12 0246  NA 132*  K 3.7  CL 100  CO2 23  GLUCOSE 132*  BUN 9  CREATININE 0.65   No results found for this basename: TROPONINI:2,CK,MB:2 in the last 72 hours Hepatic Function Panel  Basename 03/28/12 0246  PROT 6.0  ALBUMIN 3.2*  AST 17  ALT 10  ALKPHOS 58  BILITOT 0.2*  BILIDIR --  IBILI --    Basename 03/29/12 0515  CHOL 149   No results found for this basename: PROTIME in the last 72 hours  Imaging: No results found.    Assessment/Plan:  Patient Active Hospital Problem List: DM (04/24/2009)   Assessment: glucoses in mid 100 range.  A1C 6.9   Plan: monitor HYPERTENSION (04/24/2009)   Assessment: BP stable   Plan: continue current treatment Coronary artery disease (03/28/2012)   Assessment: awaiting CABG   Plan: IV UFH.  Level high--parameters call for 0.3-0.5 Mitral regurgitation (03/28/2012)   Assessment: prob has ischemic MR   Plan: discuss with Dr. Donata Clay Urine culture   Pending--nothing in Epic Anemia   Slightly  worse--microcytic--stool is heme negative      [name], MD, [facility], [facility] 03/30/2012, 8:12 AM

## 2012-03-30 NOTE — Progress Notes (Signed)
Severe coronary disease EF 35 2-D echocardiogram shows mild mitral regurgitation Surgery plan a.m. April 8 procedure benefits and risks discussed the patient and she agrees

## 2012-03-30 NOTE — Progress Notes (Signed)
Patient complained of midsternal chest pain.  Patient rated chest pain7/10.  Pt placed on 02 per protocol,EKG performed,NTG X2 administered via SL.  Patient vitals : 99.9-92-18-157/94 98 2 L.  Patient reasses 10 minutes later resting comfortably rates chest pain 1/10.  Patient instructed to inform  Of any further chest pain.

## 2012-03-30 NOTE — Progress Notes (Addendum)
ANTICOAGULATION CONSULT NOTE - Follow Up Consult  Pharmacy Consult for Heparin Indication: CAD  Allergies  Allergen Reactions  . Lisinopril Anaphylaxis  . Quinine Other (See Comments)    Pt states heart attack-like symptoms  . Sulfonamide Derivatives Other (See Comments)    unknown    Patient Measurements: Height: 5\' 8"  (172.7 cm) Weight: 154 lb 15.7 oz (70.3 kg) IBW/kg (Calculated) : 63.9  Heparin dosing wt: 70kg   Vital Signs: Temp: 98.4 F (36.9 C) (04/04 0532) Temp src: Oral (04/04 0532) BP: 119/79 mmHg (04/04 0532) Pulse Rate: 57  (04/04 0532)  Labs:  Basename 03/30/12 0527 03/29/12 0830 03/28/12 2311 03/28/12 0633 03/28/12 0246  HGB 9.4* 10.1* -- -- --  HCT 28.9* 31.3* -- 30.9* --  PLT 204 214 -- 217 --  APTT -- -- -- -- --  LABPROT -- -- -- -- --  INR -- -- -- -- --  HEPARINUNFRC 0.60 0.50 0.51 -- --  CREATININE -- -- -- -- 0.65  CKTOTAL -- -- -- -- --  CKMB -- -- -- -- --  TROPONINI -- -- -- -- --   Estimated Creatinine Clearance: 58.5 ml/min (by C-G formula based on Cr of 0.65).   Assessment: 78 yo female  with NSTEMI s/p cardiac cath 03/27/12 currently undergoing work-up for CABG (planned for Monday). Heparin level this morning is above MD stated goal.  Small decline in Hgb and platelets WNL.   Goal of Therapy:  Heparin level 0.3-0.5 per Dr Riley Kill   Plan:   1. Decrease IV heparin to 1100 units/hr 2. Daily heparin level, CBC with routine platelet monitoring per protocol  ADDENDUM:  Heparin level = 0.42 following rate reduction this morning as level was above desired range. Continue heparin at 1100units/hr as within 0.3 -0.5 range.  Labs in am.   Juliette Alcide, PharmD, BCPS.   Pager: [phone] 03/30/2012 9:10 AM

## 2012-03-31 ENCOUNTER — Encounter (HOSPITAL_COMMUNITY): Payer: Self-pay | Admitting: Cardiology

## 2012-03-31 DIAGNOSIS — I2510 Atherosclerotic heart disease of native coronary artery without angina pectoris: Secondary | ICD-10-CM

## 2012-03-31 LAB — GLUCOSE, CAPILLARY
Glucose-Capillary: 128 mg/dL — ABNORMAL HIGH (ref 70–99)
Glucose-Capillary: 97 mg/dL (ref 70–99)
Glucose-Capillary: 132 mg/dL — ABNORMAL HIGH (ref 70–99)

## 2012-03-31 LAB — CBC
MCH: 25.5 pg — ABNORMAL LOW (ref 26.0–34.0)
MCV: 78.3 fL (ref 78.0–100.0)
Platelets: 218 10*3/uL (ref 150–400)
RDW: 14.9 % (ref 11.5–15.5)

## 2012-03-31 LAB — RETICULOCYTES
RBC.: 3.93 MIL/uL (ref 3.87–5.11)
Retic Ct Pct: 2.8 % (ref 0.4–3.1)

## 2012-03-31 LAB — BASIC METABOLIC PANEL
BUN: 10 mg/dL (ref 6–23)
CO2: 22 mEq/L (ref 19–32)
Calcium: 8.7 mg/dL (ref 8.4–10.5)
Chloride: 100 mEq/L (ref 96–112)
Creatinine, Ser: 0.67 mg/dL (ref 0.50–1.10)
GFR calc Af Amer: >90 mL/min (ref >90)
GFR calc non Af Amer: 82 mL/min — ABNORMAL LOW (ref >90)
Glucose, Bld: 132 mg/dL — ABNORMAL HIGH (ref 70–99)
Potassium: 3.7 mEq/L (ref 3.5–5.1)
Sodium: 131 mEq/L — ABNORMAL LOW (ref 135–145)

## 2012-03-31 LAB — IRON AND TIBC
Iron: 16 ug/dL — ABNORMAL LOW (ref 42–135)
Saturation Ratios: 5 % — ABNORMAL LOW (ref 20–55)
TIBC: 294 ug/dL (ref 250–470)

## 2012-03-31 MED ORDER — FUROSEMIDE 10 MG/ML IJ SOLN
20.0000 mg | Freq: Once | INTRAMUSCULAR | Status: AC
  Administered 2012-03-31: 20 mg via INTRAVENOUS
  Filled 2012-03-31: qty 2

## 2012-03-31 MED ORDER — CHLORHEXIDINE GLUCONATE 4 % EX LIQD
60.0000 mL | Freq: Once | CUTANEOUS | Status: DC
  Filled 2012-03-31: qty 60

## 2012-03-31 MED ORDER — ATORVASTATIN CALCIUM 40 MG PO TABS
40.0000 mg | ORAL_TABLET | Freq: Every day | ORAL | Status: DC
  Administered 2012-03-31 – 2012-04-16 (×15): 40 mg via ORAL
  Filled 2012-03-31 (×18): qty 1

## 2012-03-31 MED ORDER — INSULIN ASPART 100 UNIT/ML ~~LOC~~ SOLN
0.0000 [IU] | Freq: Three times a day (TID) | SUBCUTANEOUS | Status: DC
  Administered 2012-03-31 – 2012-04-02 (×5): 2 [IU] via SUBCUTANEOUS

## 2012-03-31 MED ORDER — HEPARIN (PORCINE) IN NACL 100-0.45 UNIT/ML-% IJ SOLN
1150.0000 [IU]/h | INTRAMUSCULAR | Status: DC
  Administered 2012-03-31 – 2012-04-01 (×2): 1050 [IU]/h via INTRAVENOUS
  Administered 2012-04-02: 1150 [IU]/h via INTRAVENOUS
  Filled 2012-03-31 (×8): qty 250

## 2012-03-31 MED ORDER — ZOLPIDEM TARTRATE 5 MG PO TABS
5.0000 mg | ORAL_TABLET | Freq: Once | ORAL | Status: AC | PRN
  Administered 2012-04-02: 5 mg via ORAL
  Filled 2012-03-31: qty 1

## 2012-03-31 MED ORDER — TEMAZEPAM 15 MG PO CAPS: 15.0000 mg | ORAL_CAPSULE | Freq: Once | ORAL | Status: DC | PRN

## 2012-03-31 MED ORDER — CHLORHEXIDINE GLUCONATE 4 % EX LIQD
60.0000 mL | Freq: Once | CUTANEOUS | Status: AC
  Administered 2012-04-02: 4 via TOPICAL
  Filled 2012-03-31 (×2): qty 60

## 2012-03-31 MED ORDER — CHLORHEXIDINE GLUCONATE 4 % EX LIQD
60.0000 mL | Freq: Once | CUTANEOUS | Status: AC
  Administered 2012-04-03: 4 via TOPICAL
  Filled 2012-03-31: qty 60

## 2012-03-31 MED ORDER — METOPROLOL TARTRATE 12.5 MG HALF TABLET
12.5000 mg | ORAL_TABLET | Freq: Once | ORAL | Status: AC
  Administered 2012-04-03: 12.5 mg via ORAL
  Filled 2012-03-31: qty 1

## 2012-03-31 MED ORDER — BISACODYL 5 MG PO TBEC: 5.0000 mg | DELAYED_RELEASE_TABLET | Freq: Once | ORAL | Status: DC

## 2012-03-31 MED ORDER — FUROSEMIDE 20 MG PO TABS
20.0000 mg | ORAL_TABLET | Freq: Every day | ORAL | Status: DC
  Administered 2012-03-31 – 2012-04-02 (×3): 20 mg via ORAL
  Filled 2012-03-31 (×5): qty 1

## 2012-03-31 NOTE — Progress Notes (Signed)
Cardiology Progress Note Patient Name: [name] Date of Encounter: 03/31/2012, 10:50 AM     Subjective  No overnight events. Patient feels well this morning without complaints of cp or sob.    Objective   Telemetry: Sinus rhythm 50-70s, frequent PVCs, intermittent ventricular bigeminy  Medications: . aspirin EC  81 mg Oral Daily  . bisacodyl  5 mg Oral Once  . chlorhexidine  60 mL Topical Once  . chlorhexidine  60 mL Topical Once  . FLUoxetine  20 mg Oral Daily  . irbesartan  75 mg Oral Daily  . metoprolol tartrate  12.5 mg Oral Once  . metoprolol tartrate  25 mg Oral BID  . mupirocin ointment   Nasal BID  . pantoprazole  40 mg Oral Q1200   . heparin 1,100 Units/hr (03/30/12 1905)    Physical Exam: Temp:  [97.5 F (36.4 C)-99.4 F (37.4 C)] 99.4 F (37.4 C) (04/05 0620) Pulse Rate:  [60-66] 60  (04/05 1040) Resp:  [16-18] 18  (04/05 0620) BP: (116-137)/(50-76) 122/60 mmHg (04/05 1040) SpO2:  [91 %-94 %] 91 % (04/05 0620) Weight:  [157 lb 9.6 oz (71.487 kg)] 157 lb 9.6 oz (71.487 kg) (04/05 1610)  General: Pleasant elderly female, in no acute distress. Head: Normocephalic, atraumatic, sclera non-icteric, nares are without discharge.  Neck: Supple. Negative for carotid bruits or JVD Lungs: Fine rales bilaterally to mid lung fields. No wheezes or rhonchi. Breathing is unlabored. Heart: RRR S1 S2 without murmurs, rubs, or gallops.  Abdomen: Soft, non-tender, non-distended with normoactive bowel sounds. No rebound/guarding. No obvious abdominal masses. Msk:  Strength and tone appear normal for age. Extremities: 1+ pitting edema to BLE below knee. No clubbing or cyanosis. Distal pedal pulses are intact and equal bilaterally. Neuro: Alert and oriented X 3. Moves all extremities spontaneously. Psych:  Responds to questions appropriately with a normal affect.   Intake/Output Summary (Last 24 hours) at 03/31/12 1050 Last data filed at 03/31/12 0900  Gross per 24  hour  Intake    960 ml  Output    300 ml  Net    660 ml    Labs:  Basename 03/31/12 0630  NA 131*  K 3.7  CL 100  CO2 22  GLUCOSE 132*  BUN 10  CREATININE 0.67  CALCIUM 8.7   Basename 03/31/12 0630 03/30/12 0527  WBC 6.7 6.8  HGB 9.4* 9.4*  HCT 28.9* 28.9*  MCV 78.3 78.5  PLT 218 204   Basename 03/29/12 0515  CHOL 149  HDL 36*  LDLCALC 89  TRIG 960  CHOLHDL 4.1   Radiology/Studies:   03/28/2012 - CXR Findings: Mild cardiomegaly.  Small bilateral pleural effusions. Diffuse interstitial prominence slightly improved, likely mild residual interstitial edema versus chronic interstitial lung disease.  Chronic post-traumatic or postoperative deformity of the left hemithorax.  IMPRESSION: Improving but persistent interstitial prominence, likely interstitial edema.  This could also reflect underlying chronic interstitial lung disease.  Small effusions.    03/27/12 - Cardiac Cath Hemodynamics:  AO 106/55 (76)  LV 115/15 25  Coronary angiography:  Coronary dominance: right  Left mainstem: Long and tapers distally with about 20% narrowing  Left anterior descending (LAD): Totally occluded proximal to stent site and just beyond diagonal. The diagonal has an 80% occlusion. The distal LAD is collateralized both from the RCA and with bridging collaterals. The diagonal is large and is suitable for a grafting procedure.  Left circumflex (LCx): The AV circumflex comes  off after a long left main, and with a steep bend. The vessel has 95% narrowing and is hazy, likely the culprit. Distally there are tandem lesions of 80% leading to a large marginal. The first OM has modest irregularity with probably 50% segmental plaque proximally, but not critical.  Right coronary artery (RCA): Heavily calcified. There is an 75% proximal lesion, 80% prox mid lesion, and diffuse disease in the distal portion of the mid vessel. After the acute marginal, there is subtotal RCA prior to the PDA which has collateral  flow. The PDA and PLA are both large. The LAD fills by collaterals.  Left ventriculography: Left ventricular systolic function is low normal, LVEF is estimated at 50%. There is at least moderate MR seen in both the LAO and RAO projections. There is modest hypokinesis at the inferior base.  Final Conclusions:  1. Severe three vessel CAD  2. Mild reduced LV function with moderate MR  Recommendations:  1. 2D echo  2. TCTS consult.  3. May need RHC if surgical candidate.   03/27/12 - 2D Echocardiogram Study Conclusions: - Left ventricle: Akinesis of apical septal and apical lateral and apical anterior segments. Hypokinesis of inferior wall . The cavity size was normal. Wall thickness was increased in a pattern of mild LVH. The estimated ejection fraction was 35%.  - Mitral valve: Mild regurgitation. - Left atrium: The atrium was mildly dilated. - Right atrium: The atrium was mildly dilated. - Pulmonary arteries: PA peak pressure: 37mm Hg (S).   Assessment and Plan  78 y.o. female w/ PMHx significant for CAD s/p PCI to LAD '08, DMII and HTN who presented to [facility] with chest pain and EKG showing ST depression inferolaterally and positive troponin subsequently transferred to [facility] on 03/26/12.  1. NSTEMI/CAD: Cardiac cath revealed severe 3V CAD, mild reduced LV fx, EF 50%, and moderate MR. TCTS evaluated with plans for CABG on 04/03/12. Cont ASA, heparin, BB. Not on statin? LDL 89, LFTs nl. Will start Lipitor.  2. Mitral Regurgitation: Moderate by cath. Mild by echo. Possible intervention during CABG per TCTS.  3. Volume overload on exam: CXR on 4/2 noted likely interstitial edema and small bilateral pleural effusions and received 20mg  po Lasix x1 that day. Today has rales to mid lung fields and pitting BLE edema. No respiratory distress. EF 35% by echo, 50% by cath. Will give 20mg  IV lasix, and monitor daily weights and I/Os.  4. Anemia: H&H Stable @ 9.4/28.9, MCV 78.3. Occult  stool negative. Anemia panel pending.  5. HTN: BP stable, cont current meds  6. DMII: A1C 6.9. Home meds on hold. Order SSI with CBGs.  Signed, HOPE, JESSICA PA-C  Patient seen earlier, and agree with the note and plan.  She did have rales, and more prominent murmur.  Will give lasix dose today, and plan daily furosemide.  She will have TEE in the OR prior to her surgery.   See my note from today as well.    [name] 6:13 PM 03/31/2012

## 2012-03-31 NOTE — Progress Notes (Signed)
CARDIAC REHAB PHASE I   PRE:  Rate/Rhythm: 60 SR PVC  BP:  Supine:   Sitting: 120/70  Standing:    SaO2: 94 RA  MODE:  Ambulation: 590 ft   POST:  Rate/Rhythem:   BP:  Supine:   Sitting: 116/70  Standing:    SaO2: 93 RA [id number]  Assisted X 1 and used walker to ambulate.Gait steady with walker. No c/o with walking. VS stable. Back to recliner after walk with call light in reach.  Beatrix Fetters

## 2012-03-31 NOTE — Progress Notes (Signed)
Pt family concerned about area between left great toes and second toe.  Appears to be an area healing.  I applied a 2x2 dry gauze between.  Pt says it does not hurt but her family saw it and was concerned.  No pinkness, broken skin or drainage.  Will continue to monitor. [name]

## 2012-03-31 NOTE — Progress Notes (Signed)
4 Days Post-Op Procedure(s) (LRB): LEFT HEART CATHETERIZATION WITH CORONARY ANGIOGRAM (N/A) Subjective: Hypertensive diabetic patient with unstable angina and multivessel coronary disease Ventriculogram suggest some mitral regurgitation but by echo MR is mild. TEE will be performed the time of surgery. Preoperative screening with carotid Dopplers PFTs chest x-ray unremarkable. The patient is anemic with heme negative stool. The patient understands she will probably need blood transfusion therapy during heart surgery.  Objective: Vital signs in last 24 hours: Temp:  [98 F (36.7 C)-99.4 F (37.4 C)] 98 F (36.7 C) (04/05 1332) Pulse Rate:  [60-95] 95  (04/05 1332) Cardiac Rhythm:  [-] Heart block (04/05 0721) Resp:  [18] 18  (04/05 1332) BP: (116-137)/(50-76) 124/67 mmHg (04/05 1332) SpO2:  [91 %-94 %] 94 % (04/05 1332) Weight:  [157 lb 9.6 oz (71.487 kg)] 157 lb 9.6 oz (71.487 kg) (04/05 0620)  Hemodynamic parameters for last 24 hours:   Normal sinus rhythm afebrile Intake/Output from previous day: 04/04 0701 - 04/05 0700 In: 720 [P.O.:720] Out: 300 [Urine:300] Intake/Output this shift: Total I/O In: 480 [P.O.:480] Out: 100 [Urine:100]  Exam Clear lung fields Regular rhythm Soft systolic murmur in the left axilla Mild pedal edema Lab Results:  Basename 03/31/12 0630 03/30/12 0527  WBC 6.7 6.8  HGB 9.4* 9.4*  HCT 28.9* 28.9*  PLT 218 204   BMET:  Basename 03/31/12 0630  NA 131*  K 3.7  CL 100  CO2 22  GLUCOSE 132*  BUN 10  CREATININE 0.67  CALCIUM 8.7    PT/INR: No results found for this basename: LABPROT,INR in the last 72 hours ABG No results found for this basename: phart, pco2, po2, hco3, tco2, acidbasedef, o2sat   CBG (last 3)   Basename 03/31/12 1620 03/31/12 1148  GLUCAP 97 128*    Assessment/Plan: S/P Procedure(s) (LRB): LEFT HEART CATHETERIZATION WITH CORONARY ANGIOGRAM (N/A) Multivessel bypass grafting April 8. Intraoperative TEE to  determine whether mitral regurgitation is significant with the need for mitral annuloplasty repair   LOS: 5 days    VAN TRIGT III,Twilia Yaklin 03/31/2012

## 2012-03-31 NOTE — Progress Notes (Signed)
Blood bank called to notify of type and cross positive for antibodies.  Blood bank will hold 2 units for Monday surgery if more are needed MD will need to order.  I will notify MD. [name]

## 2012-03-31 NOTE — Progress Notes (Signed)
Subjective:  Patient is awaiting surgery, and I have spoken with Dr. PVT today.  She has a murmur of MR today on quiet room exam in LLD position.  TEE is planned in the OR to assess need for mitral valve intervention along with CABG.    Objective:  Vital Signs in the last 24 hours: Temp:  [98 F (36.7 C)-99.4 F (37.4 C)] 98 F (36.7 C) (04/05 1332) Pulse Rate:  [60-95] 95  (04/05 1332) Resp:  [18] 18  (04/05 1332) BP: (116-137)/(50-76) 124/67 mmHg (04/05 1332) SpO2:  [91 %-94 %] 94 % (04/05 1332) Weight:  [157 lb 9.6 oz (71.487 kg)] 157 lb 9.6 oz (71.487 kg) (04/05 0620)  Intake/Output from previous day: 04/04 0701 - 04/05 0700 In: 720 [P.O.:720] Out: 300 [Urine:300]   Physical Exam: General: Well developed, well nourished, in no acute distress. Head:  Normocephalic and atraumatic. Lungs: Minimal crackles on exam.   Heart: Normal S1 and S2.  2-3/6 murmur in LLD position with quiet room.   Pulses: Pulses normal in all 4 extremities. Extremities: No clubbing or cyanosis. No edema. Neurologic: Alert and oriented x 3.    Lab Results:  Basename 03/31/12 0630 03/30/12 0527  WBC 6.7 6.8  HGB 9.4* 9.4*  PLT 218 204    Basename 03/31/12 0630  NA 131*  K 3.7  CL 100  CO2 22  GLUCOSE 132*  BUN 10  CREATININE 0.67   No results found for this basename: TROPONINI:2,CK,MB:2 in the last 72 hours Hepatic Function Panel No results found for this basename: PROT,ALBUMIN,AST,ALT,ALKPHOS,BILITOT,BILIDIR,IBILI in the last 72 hours  Basename 03/29/12 0515  CHOL 149   No results found for this basename: PROTIME in the last 72 hours  Imaging: No results found.    Assessment/Plan:  Patient Active Hospital Problem List: DM (04/24/2009)   Assessment: sugars appear well controlled   Plan: monitor status HYPERTENSION (04/24/2009)   Assessment: BP is stable    Plan: monitor  Coronary artery disease (03/28/2012)   Assessment: awaiting CABG   Plan: surgery on Monday Mitral  regurgitation (03/28/2012)   Assessment: Mild by echo, but murmur is prominent today.  Has some crackles on exam.   Plan: repeat CXR in am and low dose of furosemide today.  Iron deficiency anemia with heme negative stool  (also said she had colon one year ago)  I spoke with Dr. Donata Clay today, and they will plan TEE in the OR on Monday prior to her surgery.   Will give dose of furosemide.        [name], MD, [facility], FSCAI 03/31/2012, 6:01 PM

## 2012-03-31 NOTE — Progress Notes (Signed)
ANTICOAGULATION CONSULT NOTE - Follow Up Consult  Pharmacy Consult for Heparin Indication: CAD  Allergies  Allergen Reactions  . Lisinopril Anaphylaxis  . Quinine Other (See Comments)    Pt states heart attack-like symptoms  . Sulfonamide Derivatives Other (See Comments)    unknown    Patient Measurements: Height: 5\' 8"  (172.7 cm) Weight: 157 lb 9.6 oz (71.487 kg) IBW/kg (Calculated) : 63.9  Heparin dosing wt: 70kg   Vital Signs: Temp: 99.4 F (37.4 C) (04/05 0620) Temp src: Oral (04/05 0620) BP: 122/60 mmHg (04/05 1040) Pulse Rate: 60  (04/05 1040)  Labs:  Basename 03/31/12 0630 03/30/12 1448 03/30/12 0527 03/29/12 0830  HGB 9.4* -- 9.4* --  HCT 28.9* -- 28.9* 31.3*  PLT 218 -- 204 214  APTT -- -- -- --  LABPROT -- -- -- --  INR -- -- -- --  HEPARINUNFRC 0.52 0.42 0.60 --  CREATININE 0.67 -- -- --  CKTOTAL -- -- -- --  CKMB -- -- -- --  TROPONINI -- -- -- --   Estimated Creatinine Clearance: 58.5 ml/min (by C-G formula based on Cr of 0.67).   Assessment: 78 yo female  with NSTEMI s/p cardiac cath 03/27/12 currently undergoing work-up for CABG (planned for Monday). Heparin level this morning is slightly above MD stated goal. Hgb and platelets stable.   Goal of Therapy:  Heparin level 0.3-0.5 per Dr Riley Kill   Plan:   1. Decrease IV heparin to 1050 units/hr 2. Daily heparin level, CBC with routine platelet monitoring per protocol  Juliette Alcide, PharmD, BCPS.   Pager: [phone] 03/31/2012 10:51 AM

## 2012-04-01 ENCOUNTER — Inpatient Hospital Stay (HOSPITAL_COMMUNITY): Payer: Medicare Other

## 2012-04-01 LAB — BASIC METABOLIC PANEL
BUN: 13 mg/dL (ref 6–23)
CO2: 20 mEq/L (ref 19–32)
Chloride: 101 mEq/L (ref 96–112)
Creatinine, Ser: 0.67 mg/dL (ref 0.50–1.10)
GFR calc Af Amer: >90 mL/min (ref >90)
Potassium: 3.4 mEq/L — ABNORMAL LOW (ref 3.5–5.1)

## 2012-04-01 LAB — CBC
Hemoglobin: 9 g/dL — ABNORMAL LOW (ref 12.0–15.0)
MCH: 25.8 pg — ABNORMAL LOW (ref 26.0–34.0)
Platelets: 226 10*3/uL (ref 150–400)

## 2012-04-01 LAB — GLUCOSE, CAPILLARY
Glucose-Capillary: 143 mg/dL — ABNORMAL HIGH (ref 70–99)
Glucose-Capillary: 100 mg/dL — ABNORMAL HIGH (ref 70–99)
Glucose-Capillary: 154 mg/dL — ABNORMAL HIGH (ref 70–99)

## 2012-04-01 MED ORDER — POTASSIUM CHLORIDE CRYS ER 20 MEQ PO TBCR
20.0000 meq | EXTENDED_RELEASE_TABLET | Freq: Once | ORAL | Status: AC
  Administered 2012-04-01: 20 meq via ORAL
  Filled 2012-04-01: qty 1

## 2012-04-01 NOTE — Progress Notes (Signed)
ANTICOAGULATION CONSULT NOTE - Follow Up Consult  Pharmacy Consult for Heparin Indication: CAD  Allergies  Allergen Reactions  . Lisinopril Anaphylaxis  . Quinine Other (See Comments)    Pt states heart attack-like symptoms  . Sulfonamide Derivatives Other (See Comments)    unknown    Patient Measurements: Height: 5\' 8"  (172.7 cm) Weight: 156 lb 12.8 oz (71.124 kg) IBW/kg (Calculated) : 63.9  Heparin dosing wt: 70kg   Vital Signs: Temp: 98.9 F (37.2 C) (04/06 0552) Temp src: Oral (04/06 0552) BP: 128/72 mmHg (04/06 1018) Pulse Rate: 65  (04/06 1018)  Labs:  Basename 04/01/12 0730 03/31/12 0630 03/30/12 1448 03/30/12 0527  HGB 9.0* 9.4* -- --  HCT 27.9* 28.9* -- 28.9*  PLT 226 218 -- 204  APTT -- -- -- --  LABPROT -- -- -- --  INR -- -- -- --  HEPARINUNFRC 0.38 0.52 0.42 --  CREATININE 0.67 0.67 -- --  CKTOTAL -- -- -- --  CKMB -- -- -- --  TROPONINI -- -- -- --   Estimated Creatinine Clearance: 58.5 ml/min (by C-G formula based on Cr of 0.67).   Assessment: 78 yo female  with NSTEMI s/p cardiac cath 03/27/12 currently awaiting CABG (planned for Monday 04/03/12). Heparin level therapeutic. No bleeding noted.   Goal of Therapy:  Heparin level 0.3-0.5 per Dr Riley Kill   Plan:   1.Continue heparin to 1050 units/hr 2. Daily heparin level, CBC with routine platelet monitoring per protocol  Junita Push, PharmD, BCPS 04/01/2012 2:21 PM

## 2012-04-01 NOTE — Progress Notes (Signed)
   Subjective: Eating lunch, no chest pain or breathlessness.   Objective: Temp:  [98 F (36.7 C)-98.9 F (37.2 C)] 98.9 F (37.2 C) (04/06 0552) Pulse Rate:  [56-95] 65  (04/06 1018) Resp:  [17-18] 17  (04/06 0552) BP: (110-128)/(58-72) 128/72 mmHg (04/06 1018) SpO2:  [92 %-94 %] 94 % (04/06 0552) Weight:  [156 lb 12.8 oz (71.124 kg)] 156 lb 12.8 oz (71.124 kg) (04/06 0552)  I/O last 3 completed shifts: In: 480 [P.O.:480] Out: 250 [Urine:250]  Exam -   General - NAD.  Lungs - Decreased but relatively clear.  Cardiac - Regular rate and rhythm, soft systolic murmur, no S3.  Extremities - No pitting.  Testing -   Lab Results  Component Value Date   WBC 5.6 04/01/2012   HGB 9.0* 04/01/2012   HCT 27.9* 04/01/2012   MCV 79.9 04/01/2012   PLT 226 04/01/2012    Lab Results  Component Value Date   CREATININE 0.67 04/01/2012   BUN 13 04/01/2012   NA 132* 04/01/2012   K 3.4* 04/01/2012   CL 101 04/01/2012   CO2 20 04/01/2012    Lab Results  Component Value Date   CKTOTAL 121 03/26/2012   CKMB 4.5* 03/26/2012   TROPONINI <0.30 03/26/2012    Current Medications    . aspirin EC  81 mg Oral Daily  . atorvastatin  40 mg Oral q1800  . bisacodyl  5 mg Oral Once  . chlorhexidine  60 mL Topical Once  . chlorhexidine  60 mL Topical Once  . FLUoxetine  20 mg Oral Daily  . furosemide  20 mg Intravenous Once  . furosemide  20 mg Oral Daily  . insulin aspart  0-15 Units Subcutaneous TID WC  . irbesartan  75 mg Oral Daily  . metoprolol tartrate  12.5 mg Oral Once  . metoprolol tartrate  25 mg Oral BID  . mupirocin ointment   Nasal BID  . pantoprazole  40 mg Oral Q1200   Chest x-ray 4/6 IMPRESSION:  Cardiomegaly with suspected mild interstitial edema and small  bilateral pleural effusions.   Assessment:  1. Multivessel CAD, LVEF 35%. Awaits CABG with Dr. Donata Clay.  2. Mitral regurgitation, mild by recent transthoracic study, with plan for TEE in OR prior to revascularization for  further assessment based on murmur.  3. Iron deficiency anemia, heme negative.  4. Hypertension.  5. Type 2 diabetes mellitus.  Plan:  Continue observation, awaits CABG first of the week. She continues on aspirin, Lipitor, Lasix, Avapro, Lopressor. Replete potassium. Followup lab work a.m.   Jonelle Sidle, M.D., F.A.C.C.

## 2012-04-02 LAB — HEPARIN LEVEL (UNFRACTIONATED): Heparin Unfractionated: 0.23 IU/mL — ABNORMAL LOW (ref 0.30–0.70)

## 2012-04-02 LAB — GLUCOSE, CAPILLARY

## 2012-04-02 LAB — BASIC METABOLIC PANEL
Calcium: 8.8 mg/dL (ref 8.4–10.5)
GFR calc Af Amer: >90 mL/min (ref >90)
GFR calc non Af Amer: 82 mL/min — ABNORMAL LOW (ref >90)
Glucose, Bld: 128 mg/dL — ABNORMAL HIGH (ref 70–99)
Potassium: 3.9 mEq/L (ref 3.5–5.1)
Sodium: 131 mEq/L — ABNORMAL LOW (ref 135–145)

## 2012-04-02 LAB — CBC
Hemoglobin: 8.9 g/dL — ABNORMAL LOW (ref 12.0–15.0)
MCH: 25.7 pg — ABNORMAL LOW (ref 26.0–34.0)
MCHC: 32.1 g/dL (ref 30.0–36.0)
Platelets: 220 10*3/uL (ref 150–400)

## 2012-04-02 MED ORDER — VANCOMYCIN HCL 1000 MG IV SOLR
1250.0000 mg | INTRAVENOUS | Status: AC
  Administered 2012-04-03: 1250 mg via INTRAVENOUS
  Filled 2012-04-02: qty 1250

## 2012-04-02 MED ORDER — VERAPAMIL HCL 2.5 MG/ML IV SOLN
INTRAVENOUS | Status: AC
  Administered 2012-04-03: 10:00:00
  Filled 2012-04-02: qty 2.5

## 2012-04-02 MED ORDER — DEXTROSE 5 % IV SOLN
1.5000 g | INTRAVENOUS | Status: AC
  Administered 2012-04-03: .75 g via INTRAVENOUS
  Administered 2012-04-03: 1.5 g via INTRAVENOUS
  Filled 2012-04-02: qty 1.5

## 2012-04-02 MED ORDER — DOPAMINE-DEXTROSE 3.2-5 MG/ML-% IV SOLN
2.0000 ug/kg/min | INTRAVENOUS | Status: AC
  Administered 2012-04-03: 3 ug/kg/min via INTRAVENOUS
  Filled 2012-04-02: qty 250

## 2012-04-02 MED ORDER — SODIUM CHLORIDE 0.9 % IV SOLN
INTRAVENOUS | Status: AC
  Administered 2012-04-03: 13:00:00 via INTRAVENOUS
  Administered 2012-04-03: 69.8 mL/h via INTRAVENOUS
  Filled 2012-04-02: qty 40

## 2012-04-02 MED ORDER — SODIUM CHLORIDE 0.9 % IV SOLN
INTRAVENOUS | Status: AC
  Administered 2012-04-03: 1 [IU]/h via INTRAVENOUS
  Filled 2012-04-02: qty 1

## 2012-04-02 MED ORDER — NITROGLYCERIN IN D5W 200-5 MCG/ML-% IV SOLN
2.0000 ug/min | INTRAVENOUS | Status: AC
  Administered 2012-04-03: 5 ug/min via INTRAVENOUS
  Filled 2012-04-02: qty 250

## 2012-04-02 MED ORDER — DEXTROSE 5 % IV SOLN
750.0000 mg | INTRAVENOUS | Status: DC
  Filled 2012-04-02: qty 750

## 2012-04-02 MED ORDER — SODIUM CHLORIDE 0.9 % IV SOLN
0.1000 ug/kg/h | INTRAVENOUS | Status: AC
  Administered 2012-04-03: .2 ug/kg/h via INTRAVENOUS
  Filled 2012-04-02: qty 4

## 2012-04-02 MED ORDER — EPINEPHRINE HCL 1 MG/ML IJ SOLN
0.5000 ug/min | INTRAVENOUS | Status: DC
  Filled 2012-04-02: qty 4

## 2012-04-02 MED ORDER — PHENYLEPHRINE HCL 10 MG/ML IJ SOLN
30.0000 ug/min | INTRAVENOUS | Status: AC
  Administered 2012-04-03: 10 ug/min via INTRAVENOUS
  Filled 2012-04-02: qty 2

## 2012-04-02 MED ORDER — MAGNESIUM SULFATE 50 % IJ SOLN
40.0000 meq | INTRAMUSCULAR | Status: DC
  Filled 2012-04-02: qty 10

## 2012-04-02 MED ORDER — POTASSIUM CHLORIDE 2 MEQ/ML IV SOLN
80.0000 meq | INTRAVENOUS | Status: DC
  Filled 2012-04-02: qty 40

## 2012-04-02 NOTE — Progress Notes (Signed)
ANTICOAGULATION CONSULT NOTE - Follow Up Consult  Pharmacy Consult for Heparin Indication: CAD  Allergies  Allergen Reactions  . Lisinopril Anaphylaxis  . Quinine Other (See Comments)    Pt states heart attack-like symptoms  . Sulfonamide Derivatives Other (See Comments)    unknown    Patient Measurements: Height: 5\' 8"  (172.7 cm) Weight: 159 lb 9.8 oz (72.4 kg) IBW/kg (Calculated) : 63.9  Heparin dosing wt: 70kg   Vital Signs: Temp: 98.7 F (37.1 C) (04/07 0500) Temp src: Oral (04/07 0500) BP: 128/80 mmHg (04/07 0500) Pulse Rate: 61  (04/07 0500)  Labs:  Basename 04/02/12 0530 04/01/12 0730 03/31/12 0630  HGB 8.9* 9.0* --  HCT 27.7* 27.9* 28.9*  PLT 220 226 218  APTT -- -- --  LABPROT -- -- --  INR -- -- --  HEPARINUNFRC 0.23* 0.38 0.52  CREATININE 0.67 0.67 0.67  CKTOTAL -- -- --  CKMB -- -- --  TROPONINI -- -- --   Estimated Creatinine Clearance: 58.5 ml/min (by C-G formula based on Cr of 0.67).   Assessment: 78 yo female  with NSTEMI s/p cardiac cath 03/27/12 currently awaiting CABG (planned for Monday 04/03/12). Heparin level is subtherapeutic. No bleeding noted.   Goal of Therapy:  Heparin level 0.3-0.5 per Dr Riley Kill   Plan:   1. Increase Heparin to 1200 units/hr 2. Check Heparin level in 6 hours  Estella Husk, Pharm.D., BCPS Clinical Pharmacist  Pager [phone] 04/02/2012, 7:24 AM

## 2012-04-02 NOTE — Progress Notes (Signed)
   Subjective:  Up in a bedside chair, no chest pain or palpitations.    Objective: Temp:  [97.8 F (36.6 C)-98.7 F (37.1 C)] 98.7 F (37.1 C) (04/07 0500) Pulse Rate:  [58-62] 61  (04/07 0500) Resp:  [18-22] 22  (04/07 0500) BP: (121-135)/(65-80) 128/80 mmHg (04/07 0500) SpO2:  [93 %-96 %] 93 % (04/07 0500) Weight:  [159 lb 9.8 oz (72.4 kg)] 159 lb 9.8 oz (72.4 kg) (04/07 0500)  I/O last 3 completed shifts: In: 720 [P.O.:720] Out: 750 [Urine:750]  Telemetry - Sinus rhythm, bursts of SVT, some NSVT.  Exam -   General - NAD.   Lungs - Decreased but relatively clear.   Cardiac - Regular rate and rhythm, soft systolic murmur, no S3.   Extremities - No pitting.  Testing -   Lab Results  Component Value Date   WBC 6.0 04/02/2012   HGB 8.9* 04/02/2012   HCT 27.7* 04/02/2012   MCV 80.1 04/02/2012   PLT 220 04/02/2012    Lab Results  Component Value Date   CREATININE 0.67 04/02/2012   BUN 10 04/02/2012   NA 131* 04/02/2012   K 3.9 04/02/2012   CL 99 04/02/2012   CO2 23 04/02/2012    Lab Results  Component Value Date   CKTOTAL 121 03/26/2012   CKMB 4.5* 03/26/2012   TROPONINI <0.30 03/26/2012    Current Medications    . aminocaproic acid (AMICAR) for OHS   Intravenous To OR  . aspirin EC  81 mg Oral Daily  . atorvastatin  40 mg Oral q1800  . bisacodyl  5 mg Oral Once  . cefUROXime (ZINACEF)  IV  1.5 g Intravenous To OR  . cefUROXime (ZINACEF)  IV  750 mg Intravenous To OR  . chlorhexidine  60 mL Topical Once  . chlorhexidine  60 mL Topical Once  . dexmedetomidine (PRECEDEX) IV infusion for high rates  0.1-0.7 mcg/kg/hr Intravenous To OR  . DOPamine  2-20 mcg/kg/min Intravenous To OR  . epinephrine  0.5-20 mcg/min Intravenous To OR  . FLUoxetine  20 mg Oral Daily  . furosemide  20 mg Oral Daily  . insulin aspart  0-15 Units Subcutaneous TID WC  . insulin (NOVOLIN-R) infusion   Intravenous To OR  . irbesartan  75 mg Oral Daily  . magnesium sulfate  40 mEq Other To OR  .  metoprolol tartrate  12.5 mg Oral Once  . metoprolol tartrate  25 mg Oral BID  . mupirocin ointment   Nasal BID  . nitroGLYCERIN  2-200 mcg/min Intravenous To OR  . nitroglycerin-verapamil-HEPARIN-sodium bicarbonate irrigation for artery spasm   Irrigation To OR  . pantoprazole  40 mg Oral Q1200  . phenylephrine (NEO-SYNEPHRINE) Adult infusion  30-200 mcg/min Intravenous To OR  . potassium chloride  80 mEq Other To OR  . potassium chloride  20 mEq Oral Once  . vancomycin  1,250 mg Intravenous To OR    Assessment:  1. Multivessel CAD, LVEF 35%. Awaits CABG with Dr. Donata Clay tomorrow.  2. Mitral regurgitation, mild by recent transthoracic study, with plan for TEE in OR prior to revascularization for further assessment based on murmur.   3. Iron deficiency anemia, heme negative.   4. Hypertension.   5. Type 2 diabetes mellitus.  6. Bursts of SVT, some NSVT on telemetry. Asymptomatic.  Plan:  Continue observation on medical therapy, awaits surgery tomorrow.  Jonelle Sidle, M.D., F.A.C.C.

## 2012-04-02 NOTE — Progress Notes (Signed)
While patient was ambulating in the hallways the monitor tech notified me of a run of VTach. Patient complained of SOB. Applied 2L of O2 and helped patient back to bed. Patient is back to NSR and states that she feels much better. Notified Theodore Demark, NP of the episode. Will continue to monitor closely. Lajuana Matte, RN

## 2012-04-02 NOTE — Progress Notes (Signed)
ANTICOAGULATION CONSULT NOTE - Follow Up Consult  Pharmacy Consult for Heparin Indication: CAD  Allergies  Allergen Reactions  . Lisinopril Anaphylaxis  . Quinine Other (See Comments)    Pt states heart attack-like symptoms  . Sulfonamide Derivatives Other (See Comments)    unknown    Patient Measurements: Height: 5\' 8"  (172.7 cm) Weight: 159 lb 9.8 oz (72.4 kg) IBW/kg (Calculated) : 63.9  Heparin dosing wt: 70kg   Vital Signs: Temp: 98.4 F (36.9 C) (04/07 1500) Temp src: Oral (04/07 0500) BP: 126/76 mmHg (04/07 1500) Pulse Rate: 64  (04/07 1500)  Labs:  Basename 04/02/12 1437 04/02/12 0530 04/01/12 0730 03/31/12 0630  HGB -- 8.9* 9.0* --  HCT -- 27.7* 27.9* 28.9*  PLT -- 220 226 218  APTT -- -- -- --  LABPROT -- -- -- --  INR -- -- -- --  HEPARINUNFRC 0.58 0.23* 0.38 --  CREATININE -- 0.67 0.67 0.67  CKTOTAL -- -- -- --  CKMB -- -- -- --  TROPONINI -- -- -- --   Estimated Creatinine Clearance: 58.5 ml/min (by C-G formula based on Cr of 0.67).   Assessment: 78 yo female  with NSTEMI s/p cardiac cath 03/27/12 currently awaiting CABG (planned for Monday 04/03/12). Heparin level now therapeutic but just above desired MD goal. No bleeding noted.   Goal of Therapy:  Heparin level 0.3-0.5 per Dr Riley Kill   Plan:   1. Decrease Heparin to 1150 units/hr 2. Plan to d/c heparin tomorrow.  Junita Push, PharmD, BCPS 04/02/2012, 3:59 PM

## 2012-04-03 ENCOUNTER — Inpatient Hospital Stay (HOSPITAL_COMMUNITY): Payer: Medicare Other

## 2012-04-03 ENCOUNTER — Inpatient Hospital Stay (HOSPITAL_COMMUNITY): Payer: Medicare Other | Admitting: Anesthesiology

## 2012-04-03 ENCOUNTER — Encounter (HOSPITAL_COMMUNITY)
Admission: EM | Disposition: A | Discharge status: Skilled Nursing Facility | Payer: Self-pay | Source: Home / Self Care | Attending: Cardiothoracic Surgery

## 2012-04-03 ENCOUNTER — Encounter (HOSPITAL_COMMUNITY): Payer: Self-pay | Admitting: Certified Registered Nurse Anesthetist

## 2012-04-03 ENCOUNTER — Encounter (HOSPITAL_COMMUNITY): Payer: Self-pay | Admitting: Anesthesiology

## 2012-04-03 DIAGNOSIS — I059 Rheumatic mitral valve disease, unspecified: Secondary | ICD-10-CM

## 2012-04-03 DIAGNOSIS — I2510 Atherosclerotic heart disease of native coronary artery without angina pectoris: Secondary | ICD-10-CM

## 2012-04-03 HISTORY — PX: MITRAL VALVE REPAIR: SHX2039

## 2012-04-03 HISTORY — PX: CORONARY ARTERY BYPASS GRAFT: SHX141

## 2012-04-03 LAB — HEPARIN LEVEL (UNFRACTIONATED): Heparin Unfractionated: 0.49 IU/mL (ref 0.30–0.70)

## 2012-04-03 LAB — POCT I-STAT 4, (NA,K, GLUC, HGB,HCT)
Potassium: 3.6 mEq/L (ref 3.5–5.1)
Sodium: 138 mEq/L (ref 135–145)

## 2012-04-03 LAB — CREATININE, SERUM
Creatinine, Ser: 0.53 mg/dL (ref 0.50–1.10)
GFR calc Af Amer: >90 mL/min (ref >90)
GFR calc non Af Amer: 89 mL/min — ABNORMAL LOW (ref >90)

## 2012-04-03 LAB — PROTIME-INR
INR: 1.53 — ABNORMAL HIGH (ref 0.00–1.49)
Prothrombin Time: 18.7 seconds — ABNORMAL HIGH (ref 11.6–15.2)

## 2012-04-03 LAB — CBC
HCT: 28.9 % — ABNORMAL LOW (ref 36.0–46.0)
HCT: 28.4 % — ABNORMAL LOW (ref 36.0–46.0)
Hemoglobin: 9.3 g/dL — ABNORMAL LOW (ref 12.0–15.0)
Hemoglobin: 9.8 g/dL — ABNORMAL LOW (ref 12.0–15.0)
MCH: 26.8 pg (ref 26.0–34.0)
MCHC: 34.5 g/dL (ref 30.0–36.0)
MCV: 79 fL (ref 78.0–100.0)
MCV: 78.1 fL (ref 78.0–100.0)
MCV: 77.6 fL — ABNORMAL LOW (ref 78.0–100.0)
Platelets: 132 10*3/uL — ABNORMAL LOW (ref 150–400)
Platelets: 135 10*3/uL — ABNORMAL LOW (ref 150–400)
RBC: 3.66 MIL/uL — ABNORMAL LOW (ref 3.87–5.11)
RBC: 3.88 MIL/uL (ref 3.87–5.11)
RBC: 3.66 MIL/uL — ABNORMAL LOW (ref 3.87–5.11)
RDW: 14.8 % (ref 11.5–15.5)
WBC: 7.2 10*3/uL (ref 4.0–10.5)
WBC: 9.5 10*3/uL (ref 4.0–10.5)
WBC: 9.1 10*3/uL (ref 4.0–10.5)

## 2012-04-03 LAB — POCT I-STAT, CHEM 8
BUN: 7 mg/dL (ref 6–23)
Chloride: 106 mEq/L (ref 96–112)
Creatinine, Ser: 0.7 mg/dL (ref 0.50–1.10)
Glucose, Bld: 107 mg/dL — ABNORMAL HIGH (ref 70–99)
Hemoglobin: 9.2 g/dL — ABNORMAL LOW (ref 12.0–15.0)
Potassium: 3.8 mEq/L (ref 3.5–5.1)

## 2012-04-03 LAB — POCT I-STAT 3, ART BLOOD GAS (G3+)
Acid-base deficit: 2 mmol/L (ref 0.0–2.0)
Acid-base deficit: 4 mmol/L — ABNORMAL HIGH (ref 0.0–2.0)
Bicarbonate: 22.2 mEq/L (ref 20.0–24.0)
Bicarbonate: 22.1 mEq/L (ref 20.0–24.0)
O2 Saturation: 99 %
O2 Saturation: 94 %
Patient temperature: 36.2
TCO2: 23 mmol/L (ref 0–100)
TCO2: 23 mmol/L (ref 0–100)
pCO2 arterial: 29.3 mmHg — ABNORMAL LOW (ref 35.0–45.0)
pH, Arterial: 7.398 (ref 7.350–7.400)
pH, Arterial: 7.491 — ABNORMAL HIGH (ref 7.350–7.400)
pO2, Arterial: 100 mmHg (ref 80.0–100.0)
pO2, Arterial: 75 mmHg — ABNORMAL LOW (ref 80.0–100.0)

## 2012-04-03 LAB — PLATELET COUNT: Platelets: 137 10*3/uL — ABNORMAL LOW (ref 150–400)

## 2012-04-03 LAB — HEMOGLOBIN AND HEMATOCRIT, BLOOD
HCT: 19 % — ABNORMAL LOW (ref 36.0–46.0)
Hemoglobin: 6.4 g/dL — CL (ref 12.0–15.0)

## 2012-04-03 LAB — APTT: aPTT: 36 seconds (ref 24–37)

## 2012-04-03 LAB — MAGNESIUM: Magnesium: 3.3 mg/dL — ABNORMAL HIGH (ref 1.5–2.5)

## 2012-04-03 LAB — GLUCOSE, CAPILLARY: Glucose-Capillary: 137 mg/dL — ABNORMAL HIGH (ref 70–99)

## 2012-04-03 SURGERY — CORONARY ARTERY BYPASS GRAFTING (CABG)
Anesthesia: General | Site: Chest | Wound class: Clean

## 2012-04-03 MED ORDER — VECURONIUM BROMIDE 10 MG IV SOLR
INTRAVENOUS | Status: DC | PRN
  Administered 2012-04-03 (×2): 5 mg via INTRAVENOUS
  Administered 2012-04-03: 10 mg via INTRAVENOUS

## 2012-04-03 MED ORDER — SODIUM CHLORIDE 0.9 % IV SOLN
250.0000 mL | INTRAVENOUS | Status: DC
  Administered 2012-04-04: 250 mL via INTRAVENOUS

## 2012-04-03 MED ORDER — ONDANSETRON HCL 4 MG/2ML IJ SOLN
4.0000 mg | Freq: Four times a day (QID) | INTRAMUSCULAR | Status: DC | PRN
  Administered 2012-04-04 – 2012-04-08 (×5): 4 mg via INTRAVENOUS
  Filled 2012-04-03 (×5): qty 2

## 2012-04-03 MED ORDER — LACTATED RINGERS IV SOLN: 500.0000 mL | Freq: Once | INTRAVENOUS | Status: AC | PRN

## 2012-04-03 MED ORDER — HEMOSTATIC AGENTS (NO CHARGE) OPTIME
TOPICAL | Status: DC | PRN
  Administered 2012-04-03: 3 via TOPICAL

## 2012-04-03 MED ORDER — LACTATED RINGERS IV SOLN
INTRAVENOUS | Status: DC
  Administered 2012-04-03: 40 mL via INTRAVENOUS

## 2012-04-03 MED ORDER — SODIUM CHLORIDE 0.9 % IV SOLN
INTRAVENOUS | Status: DC
  Administered 2012-04-03: 3.2 [IU]/h via INTRAVENOUS
  Administered 2012-04-03: 1.4 [IU]/h via INTRAVENOUS
  Administered 2012-04-03: 0.8 [IU]/h via INTRAVENOUS
  Filled 2012-04-03: qty 1

## 2012-04-03 MED ORDER — HEMOSTATIC AGENTS (NO CHARGE) OPTIME
TOPICAL | Status: DC | PRN
  Administered 2012-04-03: 2 via TOPICAL

## 2012-04-03 MED ORDER — BISACODYL 10 MG RE SUPP: 10.0000 mg | Freq: Every day | RECTAL | Status: DC

## 2012-04-03 MED ORDER — SODIUM CHLORIDE 0.9 % IV SOLN
INTRAVENOUS | Status: DC
  Administered 2012-04-03: 20 mL via INTRAVENOUS

## 2012-04-03 MED ORDER — MILRINONE IN DEXTROSE 200-5 MCG/ML-% IV SOLN
INTRAVENOUS | Status: DC | PRN
  Administered 2012-04-03: .33 ug/kg/min via INTRAVENOUS

## 2012-04-03 MED ORDER — METOPROLOL TARTRATE 25 MG/10 ML ORAL SUSPENSION
12.5000 mg | Freq: Two times a day (BID) | ORAL | Status: DC
  Filled 2012-04-03 (×5): qty 5

## 2012-04-03 MED ORDER — INSULIN ASPART 100 UNIT/ML ~~LOC~~ SOLN
0.0000 [IU] | SUBCUTANEOUS | Status: AC
  Administered 2012-04-04: 2 [IU] via SUBCUTANEOUS

## 2012-04-03 MED ORDER — NITROGLYCERIN IN D5W 200-5 MCG/ML-% IV SOLN: 0.0000 ug/min | INTRAVENOUS | Status: DC

## 2012-04-03 MED ORDER — PHENYLEPHRINE HCL 10 MG/ML IJ SOLN
0.0000 ug/min | INTRAVENOUS | Status: DC
  Filled 2012-04-03: qty 2

## 2012-04-03 MED ORDER — PROPOFOL 10 MG/ML IV EMUL
INTRAVENOUS | Status: DC | PRN
  Administered 2012-04-03: 40 mg via INTRAVENOUS

## 2012-04-03 MED ORDER — DOCUSATE SODIUM 100 MG PO CAPS
200.0000 mg | ORAL_CAPSULE | Freq: Every day | ORAL | Status: DC
  Administered 2012-04-04 – 2012-04-11 (×4): 200 mg via ORAL
  Filled 2012-04-03 (×5): qty 2

## 2012-04-03 MED ORDER — METOPROLOL TARTRATE 1 MG/ML IV SOLN
2.5000 mg | INTRAVENOUS | Status: DC | PRN
  Administered 2012-04-04: 5 mg via INTRAVENOUS
  Filled 2012-04-03: qty 5

## 2012-04-03 MED ORDER — FAMOTIDINE IN NACL 20-0.9 MG/50ML-% IV SOLN
20.0000 mg | Freq: Two times a day (BID) | INTRAVENOUS | Status: AC
  Administered 2012-04-03 (×2): 20 mg via INTRAVENOUS
  Filled 2012-04-03: qty 50

## 2012-04-03 MED ORDER — POTASSIUM CHLORIDE 10 MEQ/50ML IV SOLN
10.0000 meq | INTRAVENOUS | Status: AC
Start: 2012-04-03 — End: 2012-04-03
  Administered 2012-04-03 (×3): 10 meq via INTRAVENOUS

## 2012-04-03 MED ORDER — MAGNESIUM SULFATE 40 MG/ML IJ SOLN
4.0000 g | Freq: Once | INTRAMUSCULAR | Status: AC
  Administered 2012-04-03: 4 g via INTRAVENOUS
  Filled 2012-04-03: qty 100

## 2012-04-03 MED ORDER — LACTATED RINGERS IV SOLN
INTRAVENOUS | Status: DC | PRN
  Administered 2012-04-03: 08:00:00 via INTRAVENOUS

## 2012-04-03 MED ORDER — HEMOSTATIC AGENTS (NO CHARGE) OPTIME
TOPICAL | Status: DC | PRN
  Administered 2012-04-03: 1 via TOPICAL

## 2012-04-03 MED ORDER — SODIUM CHLORIDE 0.45 % IV SOLN
INTRAVENOUS | Status: DC
  Administered 2012-04-03: 21:00:00 via INTRAVENOUS

## 2012-04-03 MED ORDER — POTASSIUM CHLORIDE 10 MEQ/50ML IV SOLN
10.0000 meq | Freq: Once | INTRAVENOUS | Status: AC
  Administered 2012-04-03: 10 meq via INTRAVENOUS

## 2012-04-03 MED ORDER — ACETAMINOPHEN 500 MG PO TABS
1000.0000 mg | ORAL_TABLET | Freq: Four times a day (QID) | ORAL | Status: AC
  Administered 2012-04-04 – 2012-04-08 (×15): 1000 mg via ORAL
  Filled 2012-04-03 (×18): qty 2

## 2012-04-03 MED ORDER — SODIUM CHLORIDE 0.9 % IJ SOLN: 3.0000 mL | INTRAMUSCULAR | Status: DC | PRN

## 2012-04-03 MED ORDER — INSULIN ASPART 100 UNIT/ML ~~LOC~~ SOLN: 0.0000 [IU] | SUBCUTANEOUS | Status: DC

## 2012-04-03 MED ORDER — 0.9 % SODIUM CHLORIDE (POUR BTL) OPTIME
TOPICAL | Status: DC | PRN
  Administered 2012-04-03: 6000 mL

## 2012-04-03 MED ORDER — PHENYLEPHRINE HCL 10 MG/ML IJ SOLN
INTRAMUSCULAR | Status: DC | PRN
  Administered 2012-04-03 (×2): 40 ug via INTRAVENOUS

## 2012-04-03 MED ORDER — HEPARIN SODIUM (PORCINE) 1000 UNIT/ML IJ SOLN
INTRAMUSCULAR | Status: DC | PRN
  Administered 2012-04-03: 35000 [IU] via INTRAVENOUS
  Administered 2012-04-03: 5000 [IU] via INTRAVENOUS

## 2012-04-03 MED ORDER — ASPIRIN EC 325 MG PO TBEC
325.0000 mg | DELAYED_RELEASE_TABLET | Freq: Every day | ORAL | Status: DC
  Administered 2012-04-04 – 2012-04-09 (×6): 325 mg via ORAL
  Filled 2012-04-03 (×7): qty 1

## 2012-04-03 MED ORDER — MILRINONE IN DEXTROSE 200-5 MCG/ML-% IV SOLN
0.3000 ug/kg/min | INTRAVENOUS | Status: DC
  Administered 2012-04-03 – 2012-04-04 (×2): 0.3 ug/kg/min via INTRAVENOUS
  Filled 2012-04-03: qty 100

## 2012-04-03 MED ORDER — MIDAZOLAM HCL 2 MG/2ML IJ SOLN: 2.0000 mg | INTRAMUSCULAR | Status: DC | PRN

## 2012-04-03 MED ORDER — PANTOPRAZOLE SODIUM 40 MG PO TBEC
40.0000 mg | DELAYED_RELEASE_TABLET | Freq: Every day | ORAL | Status: DC
  Administered 2012-04-05 – 2012-04-17 (×13): 40 mg via ORAL
  Filled 2012-04-03 (×13): qty 1

## 2012-04-03 MED ORDER — PROTAMINE SULFATE 10 MG/ML IV SOLN
INTRAVENOUS | Status: DC | PRN
  Administered 2012-04-03 (×3): 50 mg via INTRAVENOUS
  Administered 2012-04-03: 30 mg via INTRAVENOUS
  Administered 2012-04-03: 20 mg via INTRAVENOUS
  Administered 2012-04-03 (×3): 50 mg via INTRAVENOUS

## 2012-04-03 MED ORDER — MORPHINE SULFATE 4 MG/ML IJ SOLN
2.0000 mg | INTRAMUSCULAR | Status: DC | PRN
  Administered 2012-04-04: 2 mg via INTRAVENOUS
  Filled 2012-04-03: qty 1

## 2012-04-03 MED ORDER — ASPIRIN 81 MG PO CHEW: 324.0000 mg | CHEWABLE_TABLET | Freq: Every day | ORAL | Status: DC

## 2012-04-03 MED ORDER — SODIUM CHLORIDE 0.9 % IV SOLN
1000.0000 mg | Freq: Once | INTRAVENOUS | Status: AC
  Administered 2012-04-03: 1000 mg via INTRAVENOUS
  Filled 2012-04-03: qty 1000

## 2012-04-03 MED ORDER — ACETAMINOPHEN 160 MG/5ML PO SOLN: 650.0000 mg | ORAL | Status: AC

## 2012-04-03 MED ORDER — SODIUM CHLORIDE 0.9 % IJ SOLN
3.0000 mL | Freq: Two times a day (BID) | INTRAMUSCULAR | Status: DC
  Administered 2012-04-04 – 2012-04-06 (×6): 3 mL via INTRAVENOUS

## 2012-04-03 MED ORDER — MORPHINE SULFATE 2 MG/ML IJ SOLN
1.0000 mg | INTRAMUSCULAR | Status: AC | PRN
  Administered 2012-04-04: 2 mg via INTRAVENOUS
  Filled 2012-04-03: qty 1

## 2012-04-03 MED ORDER — MILRINONE LOAD VIA INFUSION
INTRAVENOUS | Status: DC | PRN
  Administered 2012-04-03: 3600 ug via INTRAVENOUS

## 2012-04-03 MED ORDER — INSULIN REGULAR BOLUS VIA INFUSION
0.0000 [IU] | Freq: Three times a day (TID) | INTRAVENOUS | Status: DC
  Filled 2012-04-03: qty 10

## 2012-04-03 MED ORDER — ACETAMINOPHEN 650 MG RE SUPP
650.0000 mg | RECTAL | Status: AC
  Administered 2012-04-03: 650 mg via RECTAL

## 2012-04-03 MED ORDER — ALBUMIN HUMAN 5 % IV SOLN
INTRAVENOUS | Status: DC | PRN
  Administered 2012-04-03: 15:00:00 via INTRAVENOUS

## 2012-04-03 MED ORDER — DEXTROSE 5 % IV SOLN
1.5000 g | Freq: Two times a day (BID) | INTRAVENOUS | Status: AC
  Administered 2012-04-03 – 2012-04-05 (×4): 1.5 g via INTRAVENOUS
  Filled 2012-04-03 (×4): qty 1.5

## 2012-04-03 MED ORDER — AMINOCAPROIC ACID 250 MG/ML IV SOLN
5.0000 g | INTRAVENOUS | Status: DC
  Filled 2012-04-03: qty 20

## 2012-04-03 MED ORDER — DOPAMINE-DEXTROSE 3.2-5 MG/ML-% IV SOLN
3.0000 ug/kg/min | INTRAVENOUS | Status: DC
  Administered 2012-04-03 – 2012-04-06 (×2): 3 ug/kg/min via INTRAVENOUS
  Filled 2012-04-03 (×2): qty 250

## 2012-04-03 MED ORDER — ACETAMINOPHEN 160 MG/5ML PO SOLN: 975.0000 mg | Freq: Four times a day (QID) | ORAL | Status: AC

## 2012-04-03 MED ORDER — MIDAZOLAM HCL 5 MG/5ML IJ SOLN
INTRAMUSCULAR | Status: DC | PRN
  Administered 2012-04-03 (×2): 5 mg via INTRAVENOUS

## 2012-04-03 MED ORDER — LIDOCAINE IN D5W 4-5 MG/ML-% IV SOLN
INTRAVENOUS | Status: DC | PRN
  Administered 2012-04-03: 1 mg/min via INTRAVENOUS

## 2012-04-03 MED ORDER — FENTANYL CITRATE 0.05 MG/ML IJ SOLN
INTRAMUSCULAR | Status: DC | PRN
  Administered 2012-04-03: 75 ug via INTRAVENOUS
  Administered 2012-04-03: 50 ug via INTRAVENOUS
  Administered 2012-04-03: 25 ug via INTRAVENOUS
  Administered 2012-04-03 (×2): 50 ug via INTRAVENOUS
  Administered 2012-04-03: 500 ug via INTRAVENOUS
  Administered 2012-04-03 (×5): 50 ug via INTRAVENOUS
  Administered 2012-04-03: 250 ug via INTRAVENOUS

## 2012-04-03 MED ORDER — LIDOCAINE IN D5W 4-5 MG/ML-% IV SOLN
1.0000 mg/min | INTRAVENOUS | Status: DC
  Filled 2012-04-03 (×3): qty 250

## 2012-04-03 MED ORDER — ROCURONIUM BROMIDE 100 MG/10ML IV SOLN
INTRAVENOUS | Status: DC | PRN
  Administered 2012-04-03: 50 mg via INTRAVENOUS

## 2012-04-03 MED ORDER — LIDOCAINE HCL (CARDIAC) 20 MG/ML IV SOLN
INTRAVENOUS | Status: DC | PRN
  Administered 2012-04-03: 100 mg via INTRAVENOUS

## 2012-04-03 MED ORDER — ALBUMIN HUMAN 5 % IV SOLN
250.0000 mL | INTRAVENOUS | Status: AC | PRN
  Administered 2012-04-03 (×4): 250 mL via INTRAVENOUS
  Filled 2012-04-03 (×2): qty 250

## 2012-04-03 MED ORDER — OXYCODONE HCL 5 MG PO TABS: 5.0000 mg | ORAL_TABLET | ORAL | Status: DC | PRN

## 2012-04-03 MED ORDER — BISACODYL 5 MG PO TBEC
10.0000 mg | DELAYED_RELEASE_TABLET | Freq: Every day | ORAL | Status: DC
  Administered 2012-04-04 – 2012-04-06 (×3): 10 mg via ORAL
  Filled 2012-04-03 (×3): qty 2

## 2012-04-03 MED ORDER — LIDOCAINE IN D5W 4-5 MG/ML-% IV SOLN
1.0000 mg/min | INTRAVENOUS | Status: DC
  Administered 2012-04-03: 1 mg/min via INTRAVENOUS
  Filled 2012-04-03 (×2): qty 250

## 2012-04-03 MED ORDER — METOPROLOL TARTRATE 12.5 MG HALF TABLET
12.5000 mg | ORAL_TABLET | Freq: Two times a day (BID) | ORAL | Status: DC
  Filled 2012-04-03 (×5): qty 1

## 2012-04-03 MED ORDER — CALCIUM CHLORIDE 10 % IV SOLN
INTRAVENOUS | Status: DC | PRN
  Administered 2012-04-03: 0.5 g via INTRAVENOUS

## 2012-04-03 MED ORDER — SODIUM CHLORIDE 0.9 % IV SOLN
0.1000 ug/kg/h | INTRAVENOUS | Status: DC
  Administered 2012-04-03: 0.7 ug/kg/h via INTRAVENOUS
  Filled 2012-04-03: qty 2

## 2012-04-03 SURGICAL SUPPLY — 166 items
ADAPTER CARDIO PERF ANTE/RETRO (ADAPTER) ×3 IMPLANT
ADH SKN CLS APL DERMABOND .7 (GAUZE/BANDAGES/DRESSINGS) ×1
ADPR PRFSN 84XANTGRD RTRGD (ADAPTER) ×1
APPLIER CLIP 9.375 MED OPEN (MISCELLANEOUS)
APPLIER CLIP 9.375 SM OPEN (CLIP)
APR CLP MED 9.3 20 MLT OPN (MISCELLANEOUS)
APR CLP SM 9.3 20 MLT OPN (CLIP)
ATTRACTOMAT 16X20 MAGNETIC DRP (DRAPES) ×3 IMPLANT
BAG DECANTER FOR FLEXI CONT (MISCELLANEOUS) ×3 IMPLANT
BANDAGE ELASTIC 4 VELCRO ST LF (GAUZE/BANDAGES/DRESSINGS) ×6 IMPLANT
BANDAGE ELASTIC 6 VELCRO ST LF (GAUZE/BANDAGES/DRESSINGS) ×5 IMPLANT
BANDAGE GAUZE ELAST BULKY 4 IN (GAUZE/BANDAGES/DRESSINGS) ×5 IMPLANT
BASKET HEART  (ORDER IN 25'S) (MISCELLANEOUS) ×1
BASKET HEART (ORDER IN 25'S) (MISCELLANEOUS) ×1
BASKET HEART (ORDER IN 25S) (MISCELLANEOUS) ×1 IMPLANT
BLADE MINI RND TIP GREEN BEAV (BLADE) ×2 IMPLANT
BLADE STERNUM SYSTEM 6 (BLADE) ×3 IMPLANT
BLADE SURG 11 STRL SS (BLADE) ×2 IMPLANT
BLADE SURG 12 STRL SS (BLADE) ×3 IMPLANT
BLADE SURG 15 STRL LF DISP TIS (BLADE) IMPLANT
BLADE SURG 15 STRL SS (BLADE) ×3
BLADE SURG ROTATE 9660 (MISCELLANEOUS) IMPLANT
CANISTER SUCTION 2500CC (MISCELLANEOUS) ×3 IMPLANT
CANN PRFSN 3/8XRT ANG TPR 14 (MISCELLANEOUS) ×1
CANNULA GUNDRY RCSP 15FR (MISCELLANEOUS) ×3 IMPLANT
CANNULA PRFSN 3/8XRT ANG TPR14 (MISCELLANEOUS) IMPLANT
CANNULA VEN MTL TIP RT (MISCELLANEOUS) ×3
CANNULA VENOUS MAL SGL STG 40 (MISCELLANEOUS) IMPLANT
CANNULA VRC MALB SNGL STG 28FR (MISCELLANEOUS) IMPLANT
CANNULAE VENOUS MAL SGL STG 40 (MISCELLANEOUS)
CARDIAC SUCTION (MISCELLANEOUS) ×2 IMPLANT
CATH CPB KIT VANTRIGT (MISCELLANEOUS) ×3 IMPLANT
CATH ROBINSON RED A/P 18FR (CATHETERS) ×13 IMPLANT
CATH THORACIC 28FR (CATHETERS) IMPLANT
CATH THORACIC 28FR RT ANG (CATHETERS) IMPLANT
CATH THORACIC 36FR (CATHETERS) IMPLANT
CATH THORACIC 36FR RT ANG (CATHETERS) ×6 IMPLANT
CLIP APPLIE 9.375 MED OPEN (MISCELLANEOUS) IMPLANT
CLIP APPLIE 9.375 SM OPEN (CLIP) IMPLANT
CLIP FOGARTY SPRING 6M (CLIP) ×2 IMPLANT
CLIP RETRACTION 3.0MM CORONARY (MISCELLANEOUS) ×2 IMPLANT
CLIP TI MEDIUM 24 (CLIP) IMPLANT
CLIP TI WIDE RED SMALL 24 (CLIP) ×4 IMPLANT
CLOTH BEACON ORANGE TIMEOUT ST (SAFETY) ×3 IMPLANT
CONN 1/2X1/2X1/2  BEN (MISCELLANEOUS) ×2
CONN 1/2X1/2X1/2 BEN (MISCELLANEOUS) IMPLANT
CONN 3/8X1/2 ST GISH (MISCELLANEOUS) ×4 IMPLANT
CONN Y 3/8X3/8X3/8  BEN (MISCELLANEOUS)
CONN Y 3/8X3/8X3/8 BEN (MISCELLANEOUS) IMPLANT
COVER MAYO STAND STRL (DRAPES) ×4 IMPLANT
COVER SURGICAL LIGHT HANDLE (MISCELLANEOUS) ×6 IMPLANT
CRADLE DONUT ADULT HEAD (MISCELLANEOUS) ×3 IMPLANT
DERMABOND ADVANCED (GAUZE/BANDAGES/DRESSINGS) ×2
DERMABOND ADVANCED .7 DNX12 (GAUZE/BANDAGES/DRESSINGS) IMPLANT
DRAIN CHANNEL 32F RND 10.7 FF (WOUND CARE) ×3 IMPLANT
DRAPE CARDIOVASCULAR INCISE (DRAPES) ×3
DRAPE SLUSH MACHINE 52X66 (DRAPES) IMPLANT
DRAPE SLUSH/WARMER DISC (DRAPES) IMPLANT
DRAPE SRG 135X102X78XABS (DRAPES) ×1 IMPLANT
DRSG COVADERM 4X14 (GAUZE/BANDAGES/DRESSINGS) ×3 IMPLANT
ELECT BLADE 4.0 EZ CLEAN MEGAD (MISCELLANEOUS) ×3
ELECT BLADE 6.5 EXT (BLADE) ×3 IMPLANT
ELECT CAUTERY BLADE 6.4 (BLADE) ×3 IMPLANT
ELECT REM PT RETURN 9FT ADLT (ELECTROSURGICAL) ×6
ELECTRODE BLDE 4.0 EZ CLN MEGD (MISCELLANEOUS) ×1 IMPLANT
ELECTRODE REM PT RTRN 9FT ADLT (ELECTROSURGICAL) ×2 IMPLANT
GAUZE XEROFORM 1X8 LF (GAUZE/BANDAGES/DRESSINGS) ×2 IMPLANT
GLOVE BIO SURGEON STRL SZ 6 (GLOVE) IMPLANT
GLOVE BIO SURGEON STRL SZ 6.5 (GLOVE) ×4 IMPLANT
GLOVE BIO SURGEON STRL SZ7 (GLOVE) IMPLANT
GLOVE BIO SURGEON STRL SZ7.5 (GLOVE) ×8 IMPLANT
GLOVE BIO SURGEONS STRL SZ 6.5 (GLOVE) ×4
GLOVE BIOGEL PI IND STRL 6 (GLOVE) IMPLANT
GLOVE BIOGEL PI IND STRL 6.5 (GLOVE) ×4 IMPLANT
GLOVE BIOGEL PI IND STRL 7.0 (GLOVE) IMPLANT
GLOVE BIOGEL PI INDICATOR 6 (GLOVE) ×2
GLOVE BIOGEL PI INDICATOR 6.5 (GLOVE) ×8
GLOVE BIOGEL PI INDICATOR 7.0 (GLOVE) ×8
GLOVE EUDERMIC 7 POWDERFREE (GLOVE) IMPLANT
GLOVE ORTHO TXT STRL SZ7.5 (GLOVE) IMPLANT
GOWN STRL NON-REIN LRG LVL3 (GOWN DISPOSABLE) ×22 IMPLANT
HEMOSTAT POWDER SURGIFOAM 1G (HEMOSTASIS) ×9 IMPLANT
HEMOSTAT SURGICEL 2X14 (HEMOSTASIS) ×5 IMPLANT
INSERT FOGARTY 61MM (MISCELLANEOUS) IMPLANT
INSERT FOGARTY XLG (MISCELLANEOUS) IMPLANT
KIT BASIN OR (CUSTOM PROCEDURE TRAY) ×3 IMPLANT
KIT ROOM TURNOVER OR (KITS) ×3 IMPLANT
KIT SUCTION CATH 14FR (SUCTIONS) ×5 IMPLANT
KIT VASOVIEW ACCESSORY VH 2004 (KITS) ×2 IMPLANT
KIT VASOVIEW W/TROCAR VH 2000 (KITS) ×3 IMPLANT
LEAD PACING MYOCARDI (MISCELLANEOUS) ×3 IMPLANT
LINE VENT (MISCELLANEOUS) ×2 IMPLANT
LOOP VESSEL SUPERMAXI WHITE (MISCELLANEOUS) ×2 IMPLANT
MARKER GRAFT CORONARY BYPASS (MISCELLANEOUS) ×9 IMPLANT
NS IRRIG 1000ML POUR BTL (IV SOLUTION) ×15 IMPLANT
PACK OPEN HEART (CUSTOM PROCEDURE TRAY) ×3 IMPLANT
PAD ARMBOARD 7.5X6 YLW CONV (MISCELLANEOUS) ×6 IMPLANT
PENCIL BUTTON HOLSTER BLD 10FT (ELECTRODE) ×3 IMPLANT
PUNCH AORTIC ROTATE 4.0MM (MISCELLANEOUS) ×2 IMPLANT
PUNCH AORTIC ROTATE 4.5MM 8IN (MISCELLANEOUS) IMPLANT
PUNCH AORTIC ROTATE 5MM 8IN (MISCELLANEOUS) IMPLANT
RING ANLPLS CARP-EDW PHY II 26 (Prosthesis & Implant Heart) IMPLANT
RING ANNULOPLASTY PHY II (Prosthesis & Implant Heart) ×3 IMPLANT
SET CARDIOPLEGIA MPS 5001102 (MISCELLANEOUS) ×2 IMPLANT
SOLUTION ANTI FOG 6CC (MISCELLANEOUS) IMPLANT
SPONGE GAUZE 4X4 12PLY (GAUZE/BANDAGES/DRESSINGS) ×10 IMPLANT
SPONGE INTESTINAL PEANUT (DISPOSABLE) IMPLANT
SPONGE LAP 18X18 X RAY DECT (DISPOSABLE) ×4 IMPLANT
SPONGE LAP 4X18 X RAY DECT (DISPOSABLE) ×4 IMPLANT
STOPCOCK MORSE 400PSI 3WAY (MISCELLANEOUS) ×2 IMPLANT
SUCKER WEIGHTED FLEX (MISCELLANEOUS) ×2 IMPLANT
SURGIFLO TRUKIT (HEMOSTASIS) ×2 IMPLANT
SUT BONE WAX W31G (SUTURE) ×3 IMPLANT
SUT ETHIBOND (SUTURE) ×6 IMPLANT
SUT ETHIBOND 2 0 SH (SUTURE) ×3
SUT ETHIBOND 2 0 SH 36X2 (SUTURE) IMPLANT
SUT ETHIBOND 2 0 V4 (SUTURE) ×4 IMPLANT
SUT ETHIBOND 2 0V4 GREEN (SUTURE) ×4 IMPLANT
SUT ETHIBOND 2-0 RB-1 WHT (SUTURE) ×4 IMPLANT
SUT ETHIBOND 6 0 C1 (SUTURE) ×4 IMPLANT
SUT MNCRL AB 4-0 PS2 18 (SUTURE) ×2 IMPLANT
SUT PROLENE 3 0 RB 1 (SUTURE) ×2 IMPLANT
SUT PROLENE 3 0 SH DA (SUTURE) IMPLANT
SUT PROLENE 3 0 SH1 36 (SUTURE) ×10 IMPLANT
SUT PROLENE 4 0 RB 1 (SUTURE) ×3
SUT PROLENE 4 0 SH DA (SUTURE) ×3 IMPLANT
SUT PROLENE 4-0 RB1 .5 CRCL 36 (SUTURE) ×1 IMPLANT
SUT PROLENE 5 0 C 1 36 (SUTURE) IMPLANT
SUT PROLENE 6 0 C 1 30 (SUTURE) ×14 IMPLANT
SUT PROLENE 6 0 CC (SUTURE) IMPLANT
SUT PROLENE 7 0 BV 1 (SUTURE) IMPLANT
SUT PROLENE 7 0 BV1 MDA (SUTURE) ×2 IMPLANT
SUT PROLENE 7 0 DA (SUTURE) IMPLANT
SUT PROLENE 7.0 RB 3 (SUTURE) ×12 IMPLANT
SUT PROLENE 8 0 BV175 6 (SUTURE) IMPLANT
SUT PROLENE BLUE 7 0 (SUTURE) ×9 IMPLANT
SUT PROLENE POLY MONO (SUTURE) IMPLANT
SUT SILK  1 MH (SUTURE)
SUT SILK 1 MH (SUTURE) IMPLANT
SUT SILK 2 0 SH CR/8 (SUTURE) ×2 IMPLANT
SUT SILK 3 0 SH CR/8 (SUTURE) ×2 IMPLANT
SUT STEEL 6MS V (SUTURE) ×6 IMPLANT
SUT STEEL STERNAL CCS#1 18IN (SUTURE) IMPLANT
SUT STEEL SZ 6 DBL 3X14 BALL (SUTURE) ×3 IMPLANT
SUT VIC AB 1 CTX 18 (SUTURE) IMPLANT
SUT VIC AB 1 CTX 36 (SUTURE) ×6
SUT VIC AB 1 CTX36XBRD ANBCTR (SUTURE) ×2 IMPLANT
SUT VIC AB 2-0 CT1 27 (SUTURE) ×6
SUT VIC AB 2-0 CT1 TAPERPNT 27 (SUTURE) IMPLANT
SUT VIC AB 2-0 CTX 27 (SUTURE) IMPLANT
SUT VIC AB 3-0 SH 27 (SUTURE)
SUT VIC AB 3-0 SH 27X BRD (SUTURE) IMPLANT
SUT VIC AB 3-0 X1 27 (SUTURE) IMPLANT
SUT VICRYL 4-0 PS2 18IN ABS (SUTURE) IMPLANT
SUTURE E-PAK OPEN HEART (SUTURE) ×3 IMPLANT
SYSTEM SAHARA CHEST DRAIN ATS (WOUND CARE) ×3 IMPLANT
TAPE CLOTH SURG 4X10 WHT LF (GAUZE/BANDAGES/DRESSINGS) ×2 IMPLANT
TOWEL OR 17X24 6PK STRL BLUE (TOWEL DISPOSABLE) ×6 IMPLANT
TOWEL OR 17X26 10 PK STRL BLUE (TOWEL DISPOSABLE) ×8 IMPLANT
TRAY CATH LUMEN 1 20CM STRL (SET/KITS/TRAYS/PACK) ×2 IMPLANT
TRAY FOLEY IC TEMP SENS 14FR (CATHETERS) ×3 IMPLANT
TUBING ART PRESS 48 MALE/FEM (TUBING) ×4 IMPLANT
TUBING INSUFFLATION 10FT LAP (TUBING) ×3 IMPLANT
UNDERPAD 30X30 INCONTINENT (UNDERPADS AND DIAPERS) ×3 IMPLANT
VRC MALLEABLE SINGLE STG 28FR (MISCELLANEOUS) ×3
WATER STERILE IRR 1000ML POUR (IV SOLUTION) ×6 IMPLANT

## 2012-04-03 NOTE — Transfer of Care (Signed)
Immediate Anesthesia Transfer of Care Note  Patient: [name]  Procedure(s) Performed: Procedure(s) (LRB): CORONARY ARTERY BYPASS GRAFTING (CABG) (N/A) MITRAL VALVE REPAIR (MVR) (N/A)  Patient Location: SICU, 2312  Anesthesia Type: General  Level of Consciousness: sedated  Airway & Oxygen Therapy: Patient remains intubated per anesthesia plan and Patient placed on Ventilator (see vital sign flow sheet for setting)  Post-op Assessment: Report given to PACU RN and Post -op Vital signs reviewed and stable  Post vital signs: Reviewed and stable  Complications: No apparent anesthesia complications

## 2012-04-03 NOTE — OR Nursing (Signed)
X-ray report called back and no foreign objects noted per Dr. Mariam [facility].

## 2012-04-03 NOTE — Progress Notes (Signed)
I spoke with Dr. Sampson Goon this morning regarding her MR. I discussed with Dr PVT on Friday as well.   They plan TEE in OR prior to surgery.  PA pressures elevated with insertion of SG cath despite diuresis.    [name] 8:15 AM 04/03/2012

## 2012-04-03 NOTE — Progress Notes (Signed)
The patient was examined and preop studies reviewed. There has been no change from the prior exam and the patient is ready for surgery.  Plan CABG and poss MV repair-replacemt for ICM, 3 V CAD, moil-mod MR

## 2012-04-03 NOTE — Brief Op Note (Signed)
03/26/2012 - 04/03/2012  1:58 PM  PATIENT:  [name]  78 y.o. female  PRE-OPERATIVE DIAGNOSIS:  1. History of CAD 2.Moderate to severe MR  POST-OPERATIVE DIAGNOSIS:  1.History of CAD 2.Moderate to severe MR  PROCEDURE:  Procedure(s) (LRB):Left femoral a line; CORONARY ARTERY BYPASS GRAFTING (CABG)x4 (LIMA to LAD,SVG to Circumflex,SVG to Ramus Intermediate, and SVG to PDA) with EVH from the fight thigh and calf and left thigh;MITRAL VALVE REPAIR (MVR) using a Physio II Mitral ring Annuloplasty (size 26 mm)  SURGEON:  Surgeon(s) and Role:    * Kerin Perna, MD - Primary  PHYSICIAN ASSISTANT: Doree Fudge PA-C   ANESTHESIA:   general  EBL:  Total I/O In: 2200 [I.V.:2200] Out: 1550 [Urine:1550]  DRAINS:  Chest Tube(s) in the Mediastinal and Pleural spaces    COUNTS CORRECT:  YES  DICTATION: .Dragon Dictation  PLAN OF CARE: Admit to inpatient   PATIENT DISPOSITION:  ICU - intubated and hemodynamically stable.   Delay start of Pharmacological VTE agent (>24hrs) due to surgical blood loss or risk of bleeding: yes   PRE OP WEIGHT: 72 kg

## 2012-04-03 NOTE — Anesthesia Procedure Notes (Signed)
Procedure Name: Intubation Date/Time: 04/03/2012 7:54 AM Performed by: Delbert Harness Pre-anesthesia Checklist: Patient identified, Patient being monitored, Emergency Drugs available, Timeout performed and Suction available Patient Re-evaluated:Patient Re-evaluated prior to inductionOxygen Delivery Method: Circle system utilized Preoxygenation: Pre-oxygenation with 100% oxygen Intubation Type: IV induction Ventilation: Mask ventilation without difficulty and Oral airway inserted - appropriate to patient size Laryngoscope Size: Mac and 3 Grade View: Grade I Tube type: Oral Tube size: 8.0 mm Number of attempts: 1 Airway Equipment and Method: Stylet Placement Confirmation: ETT inserted through vocal cords under direct vision,  positive ETCO2 and breath sounds checked- equal and bilateral Secured at: 20 cm Tube secured with: Tape Dental Injury: Teeth and Oropharynx as per pre-operative assessment

## 2012-04-03 NOTE — Progress Notes (Signed)
Pt unable to adequately perform on NIF and vital capacity trials after 20 minutes CPAP/PS.  Pt placed back on full support.  Will retry.

## 2012-04-03 NOTE — Progress Notes (Signed)
SICU p.m. Rounds  Status post CABG x4, mitral valve repair for ischemic cardiomyopathy Still sedated on ventilator hemodynamic stable not bleeding A pre-paced with PVCs occasional

## 2012-04-03 NOTE — Preoperative (Signed)
Beta Blockers   Reason not to administer Beta Blockers:Not Applicable, took Metoprolol 0600

## 2012-04-03 NOTE — Anesthesia Postprocedure Evaluation (Signed)
  Anesthesia Post-op Note  Patient: [name]  Procedure(s) Performed: Procedure(s) (LRB): CORONARY ARTERY BYPASS GRAFTING (CABG) (N/A) MITRAL VALVE REPAIR (MVR) (N/A)  Patient Location: ICU  Anesthesia Type: General  Level of Consciousness: unresponsive  Airway and Oxygen Therapy: Patient remains intubated per anesthesia plan  Post-op Pain: none  Post-op Assessment: Post-op Vital signs reviewed, Patient's Cardiovascular Status Stable and Respiratory Function Stable  Post-op Vital Signs: Reviewed and stable  Complications: No apparent anesthesia complications

## 2012-04-03 NOTE — Anesthesia Preprocedure Evaluation (Addendum)
Anesthesia Evaluation  Patient identified by MRN, date of birth, ID band Patient awake    Reviewed: Allergy & Precautions, H&P , NPO status , Patient's Chart, lab work & pertinent test results, reviewed documented beta blocker date and time   Airway Mallampati: II TM Distance: >3 FB Neck ROM: Full    Dental No notable dental hx. (+) Edentulous Upper, Edentulous Lower and Dental Advisory Given   Pulmonary neg pulmonary ROS,    Pulmonary exam normal + decreased breath sounds      Cardiovascular hypertension, Pt. on medications + CAD, + Past MI and + Cardiac Stents negative cardio ROS  Rhythm:Regular Rate:Normal + Systolic murmurs Pt easily exhausted, difficult for her to speak in long sentences without SOB   Neuro/Psych PSYCHIATRIC DISORDERS Depression negative neurological ROS  negative psych ROS   GI/Hepatic negative GI ROS, Neg liver ROS, Diverticulitis   Endo/Other  negative endocrine ROSDiabetes mellitus-, Well Controlled, Type 2, Oral Hypoglycemic Agents  Renal/GU negative Renal ROS  negative genitourinary   Musculoskeletal   Abdominal Normal abdominal exam  (+)   Peds  Hematology negative hematology ROS (+)   Anesthesia Other Findings   Reproductive/Obstetrics negative OB ROS                      Anesthesia Physical Anesthesia Plan  ASA: IV  Anesthesia Plan: General   Post-op Pain Management:    Induction: Intravenous  Airway Management Planned: Oral ETT  Additional Equipment: Arterial line, CVP, PA Cath, Ultrasound Guidance Line Placement and TEE  Intra-op Plan:   Post-operative Plan: Post-operative intubation/ventilation  Informed Consent: I have reviewed the patients History and Physical, chart, labs and discussed the procedure including the risks, benefits and alternatives for the proposed anesthesia with the patient or authorized representative who has indicated his/her  understanding and acceptance.     Plan Discussed with: CRNA  Anesthesia Plan Comments:         Anesthesia Quick Evaluation

## 2012-04-03 NOTE — Progress Notes (Signed)
22:47 - Patient does not yet meet SICU extubation criteria.  Patient unable to perform adequate NIF and vital capacity at this time.

## 2012-04-04 ENCOUNTER — Inpatient Hospital Stay (HOSPITAL_COMMUNITY): Payer: Medicare Other

## 2012-04-04 ENCOUNTER — Encounter (HOSPITAL_COMMUNITY): Payer: Self-pay | Admitting: Cardiothoracic Surgery

## 2012-04-04 LAB — POCT I-STAT 4, (NA,K, GLUC, HGB,HCT)
Glucose, Bld: 144 mg/dL — ABNORMAL HIGH (ref 70–99)
Glucose, Bld: 123 mg/dL — ABNORMAL HIGH (ref 70–99)
Glucose, Bld: 125 mg/dL — ABNORMAL HIGH (ref 70–99)
Glucose, Bld: 142 mg/dL — ABNORMAL HIGH (ref 70–99)
Glucose, Bld: 183 mg/dL — ABNORMAL HIGH (ref 70–99)
HCT: 26 % — ABNORMAL LOW (ref 36.0–46.0)
HCT: 21 % — ABNORMAL LOW (ref 36.0–46.0)
HCT: 20 % — ABNORMAL LOW (ref 36.0–46.0)
HCT: 20 % — ABNORMAL LOW (ref 36.0–46.0)
HCT: 20 % — ABNORMAL LOW (ref 36.0–46.0)
HCT: 20 % — ABNORMAL LOW (ref 36.0–46.0)
Hemoglobin: 6.8 g/dL — CL (ref 12.0–15.0)
Hemoglobin: 7.1 g/dL — ABNORMAL LOW (ref 12.0–15.0)
Hemoglobin: 6.8 g/dL — CL (ref 12.0–15.0)
Hemoglobin: 6.8 g/dL — CL (ref 12.0–15.0)
Potassium: 3.9 mEq/L (ref 3.5–5.1)
Potassium: 3.6 mEq/L (ref 3.5–5.1)
Potassium: 5.9 mEq/L — ABNORMAL HIGH (ref 3.5–5.1)
Potassium: 5.4 mEq/L — ABNORMAL HIGH (ref 3.5–5.1)
Potassium: 3.5 mEq/L (ref 3.5–5.1)
Sodium: 132 mEq/L — ABNORMAL LOW (ref 135–145)
Sodium: 134 mEq/L — ABNORMAL LOW (ref 135–145)
Sodium: 135 mEq/L (ref 135–145)
Sodium: 131 mEq/L — ABNORMAL LOW (ref 135–145)
Sodium: 137 mEq/L (ref 135–145)

## 2012-04-04 LAB — TYPE AND SCREEN
ABO/RH(D): O NEG
Antibody Screen: POSITIVE
DAT, IgG: NEGATIVE
Donor AG Type: NEGATIVE
Donor AG Type: NEGATIVE
Donor AG Type: NEGATIVE
Donor AG Type: NEGATIVE
Donor AG Type: NEGATIVE
Donor AG Type: NEGATIVE
Unit division: 0
Unit division: 0
Unit division: 0
Unit division: 0
Unit division: 0
Unit division: 0
Unit division: 0

## 2012-04-04 LAB — GLUCOSE, CAPILLARY
Glucose-Capillary: 108 mg/dL — ABNORMAL HIGH (ref 70–99)
Glucose-Capillary: 110 mg/dL — ABNORMAL HIGH (ref 70–99)
Glucose-Capillary: 125 mg/dL — ABNORMAL HIGH (ref 70–99)
Glucose-Capillary: 153 mg/dL — ABNORMAL HIGH (ref 70–99)
Glucose-Capillary: 87 mg/dL (ref 70–99)

## 2012-04-04 LAB — POCT I-STAT 3, ART BLOOD GAS (G3+)
Acid-base deficit: 3 mmol/L — ABNORMAL HIGH (ref 0.0–2.0)
Acid-base deficit: 2 mmol/L (ref 0.0–2.0)
Acid-base deficit: 4 mmol/L — ABNORMAL HIGH (ref 0.0–2.0)
Acid-base deficit: 5 mmol/L — ABNORMAL HIGH (ref 0.0–2.0)
Bicarbonate: 21.6 mEq/L (ref 20.0–24.0)
Bicarbonate: 21.5 meq/L (ref 20.0–24.0)
O2 Saturation: 100 %
O2 Saturation: 100 %
O2 Saturation: 99 %
O2 Saturation: 96 %
O2 Saturation: 96 %
Patient temperature: 36.5
Patient temperature: 36.8
TCO2: 23 mmol/L (ref 0–100)
TCO2: 22 mmol/L (ref 0–100)
TCO2: 23 mmol/L (ref 0–100)
pCO2 arterial: 38.3 mmHg (ref 35.0–45.0)
pCO2 arterial: 39.3 mmHg (ref 35.0–45.0)
pCO2 arterial: 42.4 mmHg (ref 35.0–45.0)
pCO2 arterial: 49.3 mmHg — ABNORMAL HIGH (ref 35.0–45.0)
pH, Arterial: 7.345 — ABNORMAL LOW (ref 7.350–7.400)
pH, Arterial: 7.311 — ABNORMAL LOW (ref 7.350–7.400)
pO2, Arterial: 418 mmHg — ABNORMAL HIGH (ref 80.0–100.0)
pO2, Arterial: 81 mmHg (ref 80.0–100.0)
pO2, Arterial: 92 mmHg (ref 80.0–100.0)

## 2012-04-04 LAB — PREPARE FRESH FROZEN PLASMA: Unit division: 0

## 2012-04-04 LAB — CBC
Hemoglobin: 8.4 g/dL — ABNORMAL LOW (ref 12.0–15.0)
MCH: 26.6 pg (ref 26.0–34.0)
MCHC: 34 g/dL (ref 30.0–36.0)
MCV: 78.8 fL (ref 78.0–100.0)
Platelets: 121 10*3/uL — ABNORMAL LOW (ref 150–400)
RBC: 3.16 MIL/uL — ABNORMAL LOW (ref 3.87–5.11)
RDW: 15.1 % (ref 11.5–15.5)
WBC: 8.6 10*3/uL (ref 4.0–10.5)
WBC: 9.8 10*3/uL (ref 4.0–10.5)

## 2012-04-04 LAB — POCT I-STAT, CHEM 8
BUN: 11 mg/dL (ref 6–23)
Chloride: 106 mEq/L (ref 96–112)
Creatinine, Ser: 0.9 mg/dL (ref 0.50–1.10)
Glucose, Bld: 140 mg/dL — ABNORMAL HIGH (ref 70–99)
Potassium: 4.1 mEq/L (ref 3.5–5.1)
Sodium: 139 mEq/L (ref 135–145)

## 2012-04-04 LAB — CREATININE, SERUM
Creatinine, Ser: 0.74 mg/dL (ref 0.50–1.10)
GFR calc Af Amer: >90 mL/min (ref >90)

## 2012-04-04 LAB — BASIC METABOLIC PANEL
Chloride: 105 mEq/L (ref 96–112)
GFR calc Af Amer: >90 mL/min (ref >90)
GFR calc non Af Amer: 86 mL/min — ABNORMAL LOW (ref >90)
Potassium: 4.1 mEq/L (ref 3.5–5.1)
Sodium: 137 mEq/L (ref 135–145)

## 2012-04-04 LAB — PREPARE PLATELET PHERESIS

## 2012-04-04 MED ORDER — MORPHINE SULFATE 2 MG/ML IJ SOLN
2.0000 mg | INTRAMUSCULAR | Status: DC | PRN
  Administered 2012-04-04 – 2012-04-05 (×2): 2 mg via INTRAVENOUS
  Filled 2012-04-04 (×2): qty 1

## 2012-04-04 MED ORDER — INSULIN ASPART 100 UNIT/ML ~~LOC~~ SOLN
0.0000 [IU] | SUBCUTANEOUS | Status: DC
  Administered 2012-04-04 – 2012-04-06 (×10): 2 [IU] via SUBCUTANEOUS
  Administered 2012-04-06: 4 [IU] via SUBCUTANEOUS
  Administered 2012-04-06 – 2012-04-07 (×4): 2 [IU] via SUBCUTANEOUS
  Administered 2012-04-07: 8 [IU] via SUBCUTANEOUS
  Administered 2012-04-08 (×3): 2 [IU] via SUBCUTANEOUS
  Administered 2012-04-08: 8 [IU] via SUBCUTANEOUS
  Administered 2012-04-08 – 2012-04-09 (×4): 2 [IU] via SUBCUTANEOUS
  Administered 2012-04-09: 4 [IU] via SUBCUTANEOUS
  Administered 2012-04-09: 2 [IU] via SUBCUTANEOUS
  Administered 2012-04-09: 8 [IU] via SUBCUTANEOUS
  Administered 2012-04-10: 12 [IU] via SUBCUTANEOUS
  Administered 2012-04-10 (×3): 2 [IU] via SUBCUTANEOUS
  Administered 2012-04-11: 4 [IU] via SUBCUTANEOUS
  Administered 2012-04-11: 2 [IU] via SUBCUTANEOUS
  Administered 2012-04-11 (×2): 8 [IU] via SUBCUTANEOUS
  Administered 2012-04-11 – 2012-04-12 (×3): 2 [IU] via SUBCUTANEOUS
  Administered 2012-04-12 (×2): 8 [IU] via SUBCUTANEOUS

## 2012-04-04 MED ORDER — AMIODARONE HCL IN DEXTROSE 360-4.14 MG/200ML-% IV SOLN
60.0000 mg/h | INTRAVENOUS | Status: DC
  Administered 2012-04-04: 60 mg/h via INTRAVENOUS
  Administered 2012-04-05: 30 mg/h via INTRAVENOUS
  Administered 2012-04-05: 60 mg/h via INTRAVENOUS
  Filled 2012-04-04 (×6): qty 200

## 2012-04-04 MED ORDER — TRAMADOL HCL 50 MG PO TABS
50.0000 mg | ORAL_TABLET | Freq: Four times a day (QID) | ORAL | Status: DC | PRN
Start: 2012-04-04 — End: 2012-04-17
  Administered 2012-04-04 – 2012-04-17 (×13): 50 mg via ORAL
  Filled 2012-04-04 (×13): qty 1

## 2012-04-04 MED ORDER — AMIODARONE IV BOLUS ONLY 150 MG/100ML
150.0000 mg | Freq: Once | INTRAVENOUS | Status: AC
  Administered 2012-04-04: 150 mg via INTRAVENOUS

## 2012-04-04 MED ORDER — AMIODARONE HCL IN DEXTROSE 360-4.14 MG/200ML-% IV SOLN
INTRAVENOUS | Status: AC
  Administered 2012-04-04: 60 mg/h via INTRAVENOUS
  Filled 2012-04-04: qty 200

## 2012-04-04 MED ORDER — FUROSEMIDE 10 MG/ML IJ SOLN
20.0000 mg | Freq: Two times a day (BID) | INTRAMUSCULAR | Status: DC
  Administered 2012-04-04 (×2): 20 mg via INTRAVENOUS
  Filled 2012-04-04 (×5): qty 2

## 2012-04-04 MED ORDER — DOPAMINE-DEXTROSE 3.2-5 MG/ML-% IV SOLN: 2.0000 ug/kg/min | INTRAVENOUS | Status: DC

## 2012-04-04 MED FILL — Potassium Chloride Inj 2 mEq/ML: INTRAVENOUS | Qty: 40 | Status: AC

## 2012-04-04 MED FILL — Nitroglycerin IV Soln 5 MG/ML: INTRAVENOUS | Qty: 10 | Status: AC

## 2012-04-04 MED FILL — Magnesium Sulfate Inj 50%: INTRAMUSCULAR | Qty: 10 | Status: AC

## 2012-04-04 MED FILL — Heparin Sodium (Porcine) Inj 1000 Unit/ML: INTRAMUSCULAR | Qty: 10 | Status: AC

## 2012-04-04 MED FILL — Lactated Ringer's Solution: INTRAVENOUS | Qty: 500 | Status: AC

## 2012-04-04 MED FILL — Verapamil HCl IV Soln 2.5 MG/ML: INTRAVENOUS | Qty: 4 | Status: AC

## 2012-04-04 NOTE — Procedures (Signed)
Extubation Procedure Note  Patient Details:   Name: [name] DOB: 04-Feb-1932 MRN: [id number]   Airway Documentation:  Airway 8 mm (Active)  Secured at (cm) 20 cm 04/04/2012  3:17 AM  Measured From Lips 04/04/2012  3:17 AM  Secured Location Right 04/04/2012  3:17 AM  Secured By Pink Tape 04/04/2012  3:17 AM  Cuff Pressure (cm H2O) 24 cm H2O 04/03/2012  7:28 PM  Site Condition Dry 04/04/2012  3:17 AM    Evaluation  O2 sats: stable throughout Complications: No apparent complications Patient did tolerate procedure well. Bilateral Breath Sounds: Clear;Diminished Suctioning: Airway Yes  Filbert Schilder 04/04/2012, 3:35 AM  Patient was extubated per MD order.  Patient was coached and deep breathing, cough, and was extubated to a 4 L nasal cannula.  No evidence of stridor present.

## 2012-04-04 NOTE — Progress Notes (Signed)
Pt unable to adequately perform on NIF and vital capacity trials after 20 minutes CPAP/PS.  Pt placed back on full support.  Will retry. 

## 2012-04-04 NOTE — Plan of Care (Signed)
Problem: Phase II Progression Outcomes Goal: Patient extubated within - Outcome: Completed/Met Date Met:  04/04/12 Pt extubated after 3 attempts to wean, 6-12 hours after surgery.

## 2012-04-04 NOTE — Progress Notes (Signed)
[facility] BRIEF SICU PROGRESS NOTE  1 Day Post-Op  S/P Procedure(s) (LRB): CORONARY ARTERY BYPASS GRAFTING (CABG) (N/A) MITRAL VALVE REPAIR (MVR) (N/A)   Stable day  AAI paced, BP 102/47 stable on dopamine at 3 mcg/kg/min UOP 20-30 mL/hr - lasix ordered Hgb stable 8.4  Plan: Continue current plan  Vandy Fong H 04/04/2012 8:42 PM

## 2012-04-04 NOTE — Progress Notes (Signed)
Attempt 3 to wean.  ABG after 20 minutes CPAP/PS: pH, Arterial 7.363  pCO2 arterial 38.3 mmHg  pO2, Arterial 81.0 mmHg  Bicarbonate 21.9 mEq/L  TCO2 23 mmol/L  O2 Saturation 96.0 %  Acid-base deficit 3.0 mmol/L H  Patient temperature 36.8 C Sample type ARTERIAL  NIF 22 Vital capacity 450  Pt awake, able to follow commands.  Moderate grip bilaterally.  Able to stick out tongue, mouth words, and lift head off bed.  MD Donata Clay aware.  Order given to extubate.  Will extubate, obtain ABG in one hour and continue to monitor.

## 2012-04-04 NOTE — Progress Notes (Signed)
Lidocaine stopped per MD Donata Clay telephone verbal order with readback.

## 2012-04-04 NOTE — Progress Notes (Signed)
Patient does not meet SICU extubation criteria at this time. Patient breathing mechanics are outside parameters at this time.   Patient;s current NIF -18 and VC 590.

## 2012-04-04 NOTE — Progress Notes (Signed)
1 Day Post-Op Procedure(s) (LRB): CORONARY ARTERY BYPASS GRAFTING (CABG) (N/A) MITRAL VALVE REPAIR (MVR) (N/A) Subjective:                     [address]            [place] [address]          [phone]     POD1 CABG x4 and mitral valve repair for ischemic cardiomyopathy, ischemic mitral regurgitation Extubated on nasal cannula, atrially paced at 84 slow junctional rhythm Neurologically intact Chest x-ray with mild edema Objective: Vital signs in last 24 hours: Temp:  [96.6 F (35.9 C)-98.6 F (37 C)] 98.4 F (36.9 C) (04/09 0724) Pulse Rate:  [57-97] 87  (04/09 0700) Cardiac Rhythm:  [-] Atrial paced (04/09 0600) Resp:  [10-25] 21  (04/09 0700) BP: (78-135)/(42-74) 110/57 mmHg (04/09 0700) SpO2:  [91 %-100 %] 95 % (04/09 0700) Arterial Line BP: (86-136)/(38-62) 118/52 mmHg (04/09 0700) FiO2 (%):  [40 %-100 %] 40 % (04/09 0317) Weight:  [168 lb 6.9 oz (76.4 kg)] 168 lb 6.9 oz (76.4 kg) (04/09 0500)  Hemodynamic parameters for last 24 hours: PAP: (24-44)/(12-24) 36/19 mmHg CO:  [2.5 L/min-4.2 L/min] 4.2 L/min CI:  [1.4 L/min/m2-2.3 L/min/m2] 2.3 L/min/m2  Intake/Output from previous day: 04/08 0701 - 04/09 0700 In: 7523 [I.V.:4248; GEXBM:8413; NG/GT:30; IV Piggyback:1802] Out: 6910 [Urine:4360; Emesis/NG output:300; Blood:1700; Chest Tube:550] Intake/Output this shift:    Exam Lungs clear but distant No murmur Extremities warm Neuro intact  Lab Results:  Basename 04/04/12 0426 04/03/12 2223 04/03/12 2216  WBC 8.6 -- 9.1  HGB 8.7* 9.2* --  HCT 25.6* 27.0* --  PLT 121* -- 135*   BMET:  Basename 04/04/12 0426 04/03/12 2223 04/02/12 0530  NA 137 137 --  K 4.1 3.8 --  CL 105 106 --  CO2 22 -- 23  GLUCOSE 148* 107* --  BUN 9 7 --  CREATININE 0.58 0.70 --  CALCIUM 7.8* -- 8.8    PT/INR:  Basename 04/03/12 1645  LABPROT 18.7*  INR 1.53*   ABG    Component Value Date/Time   PHART 7.311* 04/04/2012 0439   HCO3 21.5 04/04/2012 0439   TCO2 23 04/04/2012 0439   ACIDBASEDEF 5.0* 04/04/2012 0439   O2SAT 96.0 04/04/2012 0439   CBG (last 3)   Basename 04/04/12 0342 04/04/12 0006 04/03/12 2323  GLUCAP 125* 110* 85    Assessment/Plan: S/P Procedure(s) (LRB): CORONARY ARTERY BYPASS GRAFTING (CABG) (N/A) MITRAL VALVE REPAIR (MVR) (N/A) Plan--remove tubes but leave pleural tubes in place the 2 large bilateral pleural effusions preoperatively           A pacing today, wean drips, IV Lasix diuresis           Hold on starting Coumadin today    VAN TRIGT III,Patty Lopezgarcia 04/04/2012

## 2012-04-04 NOTE — Progress Notes (Signed)
Heart rate and rhythm change:  Pt had been atrial pacing at 88 bpm.  Heart rhythm changed to atrial fib with rate in 150s and frequent PVCs.  5 mg lopressor given.  No improvement.  EKG shows accelerated junctional rhythm, atrial fib and frequent PVCs.  MD Cornelius Moras notified.  Orders given for amiodarone, bolus and continuous infusion.  Will continue to monitor.

## 2012-04-04 NOTE — Op Note (Signed)
NAMENEKITA, PITA NO.:  [id number]  MEDICAL RECORD NO.:  [id number]  LOCATION:  2312                         FACILITY:  [facility]  PHYSICIAN:  Kerin Perna, M.D.  DATE OF BIRTH:  03-26-1932  DATE OF PROCEDURE:  04/03/2012 DATE OF DISCHARGE:                              OPERATIVE REPORT   OPERATION: 1. Coronary artery bypass grafting x4 (left internal mammary artery to     left anterior descending artery, saphenous vein graft to ramus     intermedius, saphenous vein graft to obtuse marginal, saphenous     vein graft to posterior descending). 2. Mitral valve annuloplasty repair and anterior leaflet plasty for     ischemic mitral regurgitation using a 26-mm Edwards Physio II ring. 3. Endoscopic harvest of bilateral greater saphenous vein.  SURGEON:  Kerin Perna, MD  ASSISTANT:  Doree Fudge, PA-C  PREOPERATIVE DIAGNOSES:  Acute myocardial infarction, severe two-vessel coronary artery disease, severe left ventricular dysfunction, ischemic mitral regurgitation - moderate.  PREOPERATIVE DIAGNOSES:  Acute myocardial infarction, severe two-vessel coronary artery disease, severe left ventricular dysfunction, ischemic mitral regurgitation - moderate.  ANESTHESIA:  General by Dr. Autumn Patty.  INDICATIONS:  The patient is a 78 year old Caucasian female who presented with shortness of breath and chest pain, and mild pulmonary edema and positive cardiac enzymes and ST-segment changes.  Cardiac catheterization was done urgently by Dr. Riley Kill, who found severe multivessel coronary artery disease and a moderate mitral regurgitation. Surgical revascularization was recommended, and the patient was placed on heparin, and was diuresed.  Her cardiac enzymes reversed and she remained hemodynamically stable.  Preoperative Doppler studies and vascular screening showed no other significant vascular disease.  I reviewed the results of the cardiac cath and  echo with the patient and family and discussed the indications, benefits, and risks of coronary artery bypass grafting for treatment of her severe coronary artery disease and mitral valve repair for treatment of her significant moderate mitral regurgitation.  I discussed the major aspects of surgery including the use of general anesthesia and cardiopulmonary bypass, the location of the surgical incisions, expected postoperative recovery, and the potential risks of bleeding, stroke, MI, infection, and death. After reviewing these issues, she demonstrated her understanding and agreed to proceed with surgery under what I felt was an informed consent.  OPERATIVE FINDINGS:  The patient's coronary arteries were severely diseased and poor targets.  There would not be a redo candidate for grafting.  The saphenous vein was small and suboptimal.  The mammary artery was a 1.4-mm vessel with excellent flow.  The patient was anemic on presentation to the operative room with a hemoglobin of 7 and received 2 units packed cells.  The transesophageal echo showed clear moderate mitral regurgitation with a central jet, and mitral valve repair was planned.  I also placed a femoral A-line for blood pressure monitoring and to access femoral artery for possible balloon pump.  PROCEDURE:  The patient was brought to the operating room and placed supine on the operating room table.  General anesthesia was induced under invasive hemodynamic monitoring.  The chest, abdomen, and legs were prepped with Betadine and draped as a sterile field.  A sternal incision was made as the saphenous vein was harvested endoscopically from both legs.  The left internal mammary artery was harvested as a pedicle graft from its origin at the subclavian vessels.  The patient was administered heparin and after the ACT was documented as being therapeutic, pursestrings were placed in the ascending aorta and the superior and inferior  right atrium and cannulated and placed on cardiopulmonary bypass using bicaval drainage.  Caval loops were passed around the IVC and SVC and cardioplegic catheters were placed for both antegrade and retrograde cold blood cardioplegia.  The coronary arteries were identified for grafting and the mammary artery and vein grafts were prepared for the distal anastomoses.  The interatrial groove was dissected, and the patient was cooled to 32 degrees.  The aortic crossclamp was applied, and 1 liter of cold blood cardioplegia was delivered in split doses between the antegrade aortic and retrograde coronary sinus catheters.  There was good cardioplegic arrest and septal temperature dropped less than 12 degrees.  Cardioplegia was delivered every 20 minutes.  The distal coronary anastomoses were performed.  The first distal anastomosis was to the posterior descending.  This was a very difficult vessel with calcification small size and a friable intima.  A reverse saphenous vein was sewn end-to-side with running 7-0 Prolene with good flow through the graft.  The second distal anastomosis was to the distal circumflex.  This was a 1.4-mm vessel, which was an adequate target.  A reverse saphenous vein was sewn end-to-side with running 7-0 Prolene with good flow through the graft.  The third distal anastomosis was the ramus intermedius.  This was a difficult target in the intramyocardial, small in size and with a proximal 80% stenosis.  A reverse saphenous vein was sewn end-to-side with running 7-0 Prolene and there was good flow through the graft.  The fourth distal anastomosis was to the distal LAD.  This had been occluded proximally and was filled via collaterals. The anastomosis was placed at the distal third where the vessel was 1.5 mm and a 1 mm probe passed proximally.  The left IMA pedicle was brought through an opening created in the left lateral pericardium and was brought down onto the LAD  and sewn end-to-side with running 8-0 Prolene. There was good flow through the anastomosis after briefly releasing the pedicle bulldog on the mammary artery.  The bulldog was reapplied and the pedicle secured to the epicardium with 6-0 Prolene.  Cardioplegia was redosed.  Attention was then directed to the mitral valve.  A transverse atriotomy was made and the caval tapes were tightened.  The atrial retractors were positioned.  Exposure of the valve was good.  The valve had no ruptured cords or significant prolapse.  The P3 segment was somewhat retracted. The 2-0 Ethibond annular sutures were placed around the anulus, numbering 15 total.  The valve was tested with a saline test.  There was some central leak as well as leak at a cleft between A2 and A3.  A 26-mm Physio II ring was selected according to the size of the anterior leaflet.  It was put in placed and a saline taste was again repeated. There was still small regurgitant jet from the area of the cleft between A2 and A3 segments and this was closed with some interrupted 5-0 Ethibond sutures.  This completely eliminated the mitral regurgitation. The atriotomy was closed in 2 layers using running 3-0 Prolene. Cardioplegia was redosed and the three proximal vein anastomoses were performed  on the ascending aorta.  The veins were fairly small and the proximal anastomoses were carefully constructed.  Air was vented from the coronary arteries and the crossclamp was removed.  The heart was cardioverted back to a regular rhythm and the patient was reperfused and rewarmed.  The proximal and distal anastomoses were checked and found to be hemostatic.  The cardioplegia lines were removed and the grafts were de-aired.  Temporary pacing wires were applied and low-dose dopamine and milrinone were started.  The patient was then weaned off cardiopulmonary bypass without difficulty.  LV function showed global improvement by transesophageal echo.   There was no mitral regurgitation.  Protamine was administered without adverse reaction.  The cannulas were removed.  The mediastinum was irrigated.  There was still some diffuse oozing and the patient was treated with platelets and FFP with improved coagulation function.  The superior pericardial fat was closed over the aorta.  An anterior mediastinal and two pleural tubes were placed and brought out through separate incisions.  The sternum was closed with wire.  The pectoralis fascia was closed with a running #1 Vicryl.  The subcutaneous and skin layers were closed with running Vicryl, and sterile dressings were applied.  Total cardiopulmonary bypass time was 155 minutes.     Kerin Perna, M.D.     PV/MEDQ  D:  04/03/2012  T:  04/04/2012  Job:  [id number]  cc:   Arturo Morton. Riley Kill, MD, [facility]

## 2012-04-05 ENCOUNTER — Inpatient Hospital Stay (HOSPITAL_COMMUNITY): Payer: Medicare Other

## 2012-04-05 LAB — CBC
HCT: 24.2 % — ABNORMAL LOW (ref 36.0–46.0)
Hemoglobin: 8.1 g/dL — ABNORMAL LOW (ref 12.0–15.0)
MCH: 26.8 pg (ref 26.0–34.0)
MCHC: 33.5 g/dL (ref 30.0–36.0)
MCV: 80.1 fL (ref 78.0–100.0)
Platelets: 148 10*3/uL — ABNORMAL LOW (ref 150–400)
RBC: 3.02 MIL/uL — ABNORMAL LOW (ref 3.87–5.11)
RDW: 15.9 % — ABNORMAL HIGH (ref 11.5–15.5)
WBC: 9.9 10*3/uL (ref 4.0–10.5)

## 2012-04-05 LAB — GLUCOSE, CAPILLARY
Glucose-Capillary: 132 mg/dL — ABNORMAL HIGH (ref 70–99)
Glucose-Capillary: 138 mg/dL — ABNORMAL HIGH (ref 70–99)
Glucose-Capillary: 117 mg/dL — ABNORMAL HIGH (ref 70–99)
Glucose-Capillary: 154 mg/dL — ABNORMAL HIGH (ref 70–99)

## 2012-04-05 LAB — BASIC METABOLIC PANEL
BUN: 15 mg/dL (ref 6–23)
CO2: 23 mEq/L (ref 19–32)
Calcium: 8.4 mg/dL (ref 8.4–10.5)
Chloride: 104 mEq/L (ref 96–112)
Creatinine, Ser: 0.86 mg/dL (ref 0.50–1.10)
GFR calc Af Amer: 73 mL/min — ABNORMAL LOW (ref >90)
GFR calc non Af Amer: 63 mL/min — ABNORMAL LOW (ref >90)
Glucose, Bld: 146 mg/dL — ABNORMAL HIGH (ref 70–99)
Potassium: 3.8 mEq/L (ref 3.5–5.1)
Sodium: 136 mEq/L (ref 135–145)

## 2012-04-05 LAB — PREPARE RBC (CROSSMATCH)

## 2012-04-05 MED ORDER — METOPROLOL TARTRATE 12.5 MG HALF TABLET
12.5000 mg | ORAL_TABLET | Freq: Two times a day (BID) | ORAL | Status: DC
  Administered 2012-04-05 (×2): 12.5 mg via ORAL
  Filled 2012-04-05 (×4): qty 1

## 2012-04-05 MED ORDER — POTASSIUM CHLORIDE 10 MEQ/50ML IV SOLN
10.0000 meq | Freq: Once | INTRAVENOUS | Status: AC
  Administered 2012-04-05: 10 meq via INTRAVENOUS
  Filled 2012-04-05: qty 50

## 2012-04-05 MED ORDER — METOPROLOL TARTRATE 25 MG/10 ML ORAL SUSPENSION
12.5000 mg | Freq: Two times a day (BID) | ORAL | Status: DC
  Filled 2012-04-05 (×4): qty 5

## 2012-04-05 MED ORDER — ALBUMIN HUMAN 5 % IV SOLN
12.5000 g | Freq: Once | INTRAVENOUS | Status: AC
  Administered 2012-04-05: 12.5 g via INTRAVENOUS

## 2012-04-05 MED ORDER — FUROSEMIDE 10 MG/ML IJ SOLN
40.0000 mg | Freq: Once | INTRAMUSCULAR | Status: AC
  Administered 2012-04-05: 40 mg via INTRAVENOUS
  Filled 2012-04-05: qty 4

## 2012-04-05 MED ORDER — ALBUMIN HUMAN 5 % IV SOLN
INTRAVENOUS | Status: AC
  Administered 2012-04-05: 12.5 g via INTRAVENOUS
  Filled 2012-04-05: qty 250

## 2012-04-05 MED ORDER — FUROSEMIDE 10 MG/ML IJ SOLN
20.0000 mg | Freq: Once | INTRAMUSCULAR | Status: AC
  Administered 2012-04-05: 20 mg via INTRAVENOUS
  Filled 2012-04-05: qty 2

## 2012-04-05 MED ORDER — LACTATED RINGERS IV SOLN
INTRAVENOUS | Status: DC
  Administered 2012-04-06: 05:00:00 via INTRAVENOUS

## 2012-04-05 MED ORDER — AMIODARONE HCL 200 MG PO TABS
200.0000 mg | ORAL_TABLET | Freq: Two times a day (BID) | ORAL | Status: DC
  Administered 2012-04-05 – 2012-04-06 (×2): 200 mg via ORAL
  Filled 2012-04-05 (×3): qty 1

## 2012-04-05 NOTE — Progress Notes (Signed)
Patient ID: [name], female   DOB: June 04, 1932, 78 y.o.   MRN: [id number] Up in chair PT did not see today BP 100/60  Pulse 79  Temp(Src) 98 F (36.7 C) (Oral)  Resp 21  Ht 5\' 8"  (1.727 m)  Wt 166 lb 14.2 oz (75.7 kg)  BMI 25.38 kg/m2  SpO2 98%  Intake/Output Summary (Last 24 hours) at 04/05/12 1844 Last data filed at 04/05/12 1800  Gross per 24 hour  Intake 1759.92 ml  Output   1060 ml  Net 699.92 ml   Continue current care

## 2012-04-05 NOTE — Progress Notes (Signed)
MD Cornelius Moras made aware of decrease in blood pressure.  Albumin ordered, given.  Will continue to monitor.

## 2012-04-05 NOTE — Progress Notes (Signed)
2 Days Post-Op Procedure(s) (LRB): CORONARY ARTERY BYPASS GRAFTING (CABG) (N/A) MITRAL VALVE REPAIR (MVR) (N/A) Subjective:                     [address]            [place] [address]          [phone]     Post Op CABG, MV repair Afib last pm- now on IV amio CXR mild effusions On renal dopamine  Objective: Vital signs in last 24 hours: Temp:  [97.5 F (36.4 C)-98.5 F (36.9 C)] 97.5 F (36.4 C) (04/10 0751) Pulse Rate:  [41-112] 103  (04/10 0800) Cardiac Rhythm:  [-] Atrial fibrillation (04/10 0800) Resp:  [12-28] 20  (04/10 0800) BP: (78-115)/(44-88) 93/47 mmHg (04/10 0800) SpO2:  [90 %-97 %] 95 % (04/10 0800) Arterial Line BP: (91-133)/(32-55) 91/38 mmHg (04/09 1700) Weight:  [166 lb 14.2 oz (75.7 kg)] 166 lb 14.2 oz (75.7 kg) (04/10 0500)  Hemodynamic parameters for last 24 hours: PAP: (24-37)/(2-18) 24/2 mmHg  Intake/Output from previous day: 04/09 0701 - 04/10 0700 In: 1025.6 [I.V.:917.6; IV Piggyback:108] Out: 1355 [Urine:875; Chest Tube:480] Intake/Output this shift: Total I/O In: 40.8 [I.V.:40.8] Out: 130 [Urine:30; Chest Tube:100]  EXAM Weak but OOB to chair No murmur Dec breath sounds Extrem warm  Lab Results:  Basename 04/05/12 0355 04/04/12 1736  WBC 9.9 9.8  HGB 8.1* 8.4*  HCT 24.2* 24.9*  PLT 148* 139*   BMET:  Basename 04/05/12 0355 04/04/12 1736 04/04/12 1717 04/04/12 0426  NA 136 -- 139 --  K 3.8 -- 4.1 --  CL 104 -- 106 --  CO2 23 -- -- 22  GLUCOSE 146* -- 140* --  BUN 15 -- 11 --  CREATININE 0.86 0.74 -- --  CALCIUM 8.4 -- -- 7.8*    PT/INR:  Basename 04/03/12 1645  LABPROT 18.7*  INR 1.53*   ABG    Component Value Date/Time   PHART 7.311* 04/04/2012 0439   HCO3 21.5 04/04/2012 0439   TCO2 22 04/04/2012 1717   ACIDBASEDEF 5.0* 04/04/2012 0439   O2SAT 96.0 04/04/2012 0439   CBG (last 3)   Basename 04/05/12 0340 04/05/12 04/04/12 1952  GLUCAP 149* 148* 133*    Assessment/Plan: S/P Procedure(s)  (LRB): CORONARY ARTERY BYPASS GRAFTING (CABG) (N/A) MITRAL VALVE REPAIR (MVR) (N/A) Transfuse for periop anemia,low BP Cont IV amio for A-fib   LOS: 10 days    VAN TRIGT III,Samad Thon 04/05/2012

## 2012-04-05 NOTE — Progress Notes (Signed)
MD Cornelius Moras updated on continued a. Fib with HR ranging from 100-120s.  Amio therapy continued.  BP increased after previous albumin.  Will continue to monitor.

## 2012-04-06 ENCOUNTER — Inpatient Hospital Stay (HOSPITAL_COMMUNITY): Payer: Medicare Other

## 2012-04-06 DIAGNOSIS — I517 Cardiomegaly: Secondary | ICD-10-CM

## 2012-04-06 LAB — CBC
HCT: 27.3 % — ABNORMAL LOW (ref 36.0–46.0)
Hemoglobin: 9.2 g/dL — ABNORMAL LOW (ref 12.0–15.0)
MCH: 26.8 pg (ref 26.0–34.0)
MCHC: 33.7 g/dL (ref 30.0–36.0)
MCV: 79.6 fL (ref 78.0–100.0)
Platelets: 160 10*3/uL (ref 150–400)
RBC: 3.43 MIL/uL — ABNORMAL LOW (ref 3.87–5.11)
RDW: 16 % — ABNORMAL HIGH (ref 11.5–15.5)
WBC: 11.6 10*3/uL — ABNORMAL HIGH (ref 4.0–10.5)

## 2012-04-06 LAB — GLUCOSE, CAPILLARY
Glucose-Capillary: 102 mg/dL — ABNORMAL HIGH (ref 70–99)
Glucose-Capillary: 106 mg/dL — ABNORMAL HIGH (ref 70–99)
Glucose-Capillary: 91 mg/dL (ref 70–99)

## 2012-04-06 LAB — COMPREHENSIVE METABOLIC PANEL
ALT: 33 U/L (ref 0–35)
AST: 22 U/L (ref 0–37)
Albumin: 2.7 g/dL — ABNORMAL LOW (ref 3.5–5.2)
Alkaline Phosphatase: 58 U/L (ref 39–117)
BUN: 24 mg/dL — ABNORMAL HIGH (ref 6–23)
CO2: 23 mEq/L (ref 19–32)
Calcium: 8.4 mg/dL (ref 8.4–10.5)
Chloride: 105 mEq/L (ref 96–112)
Creatinine, Ser: 0.79 mg/dL (ref 0.50–1.10)
GFR calc Af Amer: >90 mL/min (ref >90)
GFR calc non Af Amer: 78 mL/min — ABNORMAL LOW (ref >90)
Glucose, Bld: 107 mg/dL — ABNORMAL HIGH (ref 70–99)
Potassium: 3.6 mEq/L (ref 3.5–5.1)
Sodium: 137 mEq/L (ref 135–145)
Total Bilirubin: 0.4 mg/dL (ref 0.3–1.2)
Total Protein: 5.3 g/dL — ABNORMAL LOW (ref 6.0–8.3)

## 2012-04-06 LAB — PROTIME-INR
INR: 1.1 (ref 0.00–1.49)
Prothrombin Time: 14.4 seconds (ref 11.6–15.2)

## 2012-04-06 MED ORDER — AMIODARONE HCL 200 MG PO TABS
400.0000 mg | ORAL_TABLET | Freq: Two times a day (BID) | ORAL | Status: DC
  Administered 2012-04-06 – 2012-04-08 (×4): 400 mg via ORAL
  Filled 2012-04-06 (×5): qty 2

## 2012-04-06 MED ORDER — DEXTROSE 5 % IV SOLN
150.0000 mg | Freq: Once | INTRAVENOUS | Status: AC
  Administered 2012-04-06: 150 mg via INTRAVENOUS
  Filled 2012-04-06: qty 3

## 2012-04-06 MED ORDER — ENOXAPARIN SODIUM 30 MG/0.3ML ~~LOC~~ SOLN
30.0000 mg | SUBCUTANEOUS | Status: AC
  Administered 2012-04-06 – 2012-04-09 (×4): 30 mg via SUBCUTANEOUS
  Filled 2012-04-06 (×5): qty 0.3

## 2012-04-06 MED ORDER — DIGOXIN 0.25 MG/ML IJ SOLN
0.2500 mg | Freq: Once | INTRAMUSCULAR | Status: AC
  Administered 2012-04-07: 0.125 mg via INTRAVENOUS
  Filled 2012-04-06 (×2): qty 1

## 2012-04-06 MED ORDER — DIGOXIN 0.25 MG/ML IJ SOLN
0.5000 mg | Freq: Once | INTRAMUSCULAR | Status: AC
  Administered 2012-04-06: 0.5 mg via INTRAVENOUS
  Filled 2012-04-06: qty 2

## 2012-04-06 MED ORDER — FUROSEMIDE 10 MG/ML IJ SOLN
40.0000 mg | Freq: Once | INTRAMUSCULAR | Status: AC
  Administered 2012-04-06: 40 mg via INTRAVENOUS
  Filled 2012-04-06: qty 4

## 2012-04-06 MED ORDER — SODIUM CHLORIDE 0.9 % IJ SOLN
10.0000 mL | INTRAMUSCULAR | Status: DC | PRN
  Administered 2012-04-08 – 2012-04-10 (×2): 10 mL

## 2012-04-06 MED ORDER — ALBUMIN HUMAN 5 % IV SOLN
12.5000 g | Freq: Once | INTRAVENOUS | Status: AC
  Administered 2012-04-06: 12.5 g via INTRAVENOUS
  Filled 2012-04-06: qty 250

## 2012-04-06 MED ORDER — POTASSIUM CHLORIDE 10 MEQ/50ML IV SOLN
INTRAVENOUS | Status: AC
  Administered 2012-04-06: 10 meq via INTRAVENOUS
  Filled 2012-04-06: qty 50

## 2012-04-06 MED ORDER — METOLAZONE 5 MG PO TABS
5.0000 mg | ORAL_TABLET | Freq: Every day | ORAL | Status: AC
  Administered 2012-04-06: 5 mg via ORAL
  Filled 2012-04-06: qty 1

## 2012-04-06 MED ORDER — SODIUM CHLORIDE 0.9 % IJ SOLN
10.0000 mL | Freq: Two times a day (BID) | INTRAMUSCULAR | Status: DC
  Administered 2012-04-06: 20 mL
  Administered 2012-04-06 – 2012-04-07 (×2): 10 mL
  Administered 2012-04-08: 20 mL
  Administered 2012-04-09: 10 mL

## 2012-04-06 MED ORDER — POTASSIUM CHLORIDE 10 MEQ/50ML IV SOLN
10.0000 meq | INTRAVENOUS | Status: AC | PRN
  Administered 2012-04-06 (×3): 10 meq via INTRAVENOUS
  Filled 2012-04-06: qty 100

## 2012-04-06 NOTE — Progress Notes (Signed)
PROGRESS NOTE  Subjective:   [name] is a 78 yo with hx of CAD - s/p CABG.    Objective:    Vital Signs:   Temp:  [97.7 F (36.5 C)-98 F (36.7 C)] 97.9 F (36.6 C) (04/11 0738) Pulse Rate:  [25-96] 81  (04/11 0800) Resp:  [13-26] 14  (04/11 0800) BP: (83-145)/(42-89) 114/56 mmHg (04/11 0800) SpO2:  [88 %-99 %] 96 % (04/11 0800) Weight:  [167 lb 12.3 oz (76.1 kg)] 167 lb 12.3 oz (76.1 kg) (04/11 0500)  Last BM Date: 04/02/12   24-hour weight change: Weight change: 14.1 oz (0.4 kg)  Weight trends: Filed Weights   04/04/12 0500 04/05/12 0500 04/06/12 0500  Weight: 168 lb 6.9 oz (76.4 kg) 166 lb 14.2 oz (75.7 kg) 167 lb 12.3 oz (76.1 kg)    Intake/Output:  04/10 0701 - 04/11 0700 In: 1634.9 [P.O.:30; I.V.:1138.4; Blood:312.5; IV Piggyback:154] Out: 1395 [Urine:1005; Chest Tube:390] Total I/O In: 24.1 [I.V.:24.1] Out: 35 [Urine:35]   Physical Exam: BP 114/56  Pulse 81  Temp(Src) 97.9 F (36.6 C) (Oral)  Resp 14  Ht 5\' 8"  (1.727 m)  Wt 167 lb 12.3 oz (76.1 kg)  BMI 25.51 kg/m2  SpO2 96%  General: Vital signs reviewed and noted. Well-developed, well-nourished, in no acute distress; alert, appropriate and cooperative throughout examination.  Head: Normocephalic, atraumatic.  Eyes: conjunctivae/corneas clear. PERRL, EOM's intact. Fundi benign.  Throat: Oropharynx nonerythematous, no exudate appreciated.  Right IJ swan sleave in place  Neck: Supple. Normal carotids. No JVD  Lungs:  Clear bilaterally to auscultation without wheezes, rales, or rhonchi. Breathing is unlabored.  Chest tube in place  Heart: Regular rate,  With normal  S1 S2. No murmurs, rubs, or gallops   Abdomen:  Soft, non-tender, non-distended with normoactive bowel sounds. No hepatomegaly. No rebound/guarding. No abdominal masses.  Extremities: No edema.  Distal pedal pulses are 2+ and equal bilaterally.  Neurologic: A&O X3, CN II - XII are grossly intact. Motor strength is 5/5 in the all 4  extremities.  Psych: Responds to questions appropriately with normal affect.    Labs: BMET:  Basename 04/06/12 0430 04/05/12 0355 04/04/12 1736 04/04/12 0426  NA 137 136 -- --  K 3.6 3.8 -- --  CL 105 104 -- --  CO2 23 23 -- --  GLUCOSE 107* 146* -- --  BUN 24* 15 -- --  CREATININE 0.79 0.86 -- --  CALCIUM 8.4 8.4 -- --  MG -- -- 2.5 2.6*  PHOS -- -- -- --    Liver function tests:  Basename 04/06/12 0430  AST 22  ALT 33  ALKPHOS 58  BILITOT 0.4  PROT 5.3*  ALBUMIN 2.7*   No results found for this basename: LIPASE:2,AMYLASE:2 in the last 72 hours  CBC:  Basename 04/06/12 0430 04/05/12 0355  WBC 11.6* 9.9  NEUTROABS -- --  HGB 9.2* 8.1*  HCT 27.3* 24.2*  MCV 79.6 80.1  PLT 160 148*    Cardiac Enzymes: No results found for this basename: CKTOTAL:4,CKMB:4,TROPONINI:4 in the last 72 hours  Coagulation Studies:  Basename 04/06/12 0430 2012-04-08 1645  LABPROT 14.4 18.7*  INR 1.10 1.53*    Other:   Tele:  A-pacing  Medications:    Infusions:    . DOPamine 3 mcg/kg/min (04/06/12 0800)  . lactated ringers 20 mL/hr at 04/06/12 0453  . DISCONTD: amiodarone (NEXTERONE PREMIX) 360 mg/200 mL dextrose Stopped (04/06/12 0022)    Scheduled Medications:    . acetaminophen  1,000 mg Oral Q6H   Or  . acetaminophen (TYLENOL) oral liquid 160 mg/5 mL  975 mg Per Tube Q6H  . amiodarone  200 mg Oral BID  . aspirin EC  325 mg Oral Daily  . atorvastatin  40 mg Oral q1800  . bisacodyl  10 mg Oral Daily   Or  . bisacodyl  10 mg Rectal Daily  . cefUROXime (ZINACEF)  IV  1.5 g Intravenous Q12H  . docusate sodium  200 mg Oral Daily  . enoxaparin  30 mg Subcutaneous Q24H  . FLUoxetine  20 mg Oral Daily  . furosemide  20 mg Intravenous Once  . furosemide  40 mg Intravenous Once  . furosemide  40 mg Intravenous Once  . insulin aspart  0-24 Units Subcutaneous Q4H  . metolazone  5 mg Oral Daily  . metoprolol tartrate  12.5 mg Oral BID   Or  . metoprolol tartrate   12.5 mg Per Tube BID  . pantoprazole  40 mg Oral Q1200  . potassium chloride  10 mEq Intravenous Once  . sodium chloride  3 mL Intravenous Q12H    Assessment/ Plan:    1. CAD: s/p CABG and MR repair.   HR and BP are OK.  Still on dopamine for ? Renal perfusion.  Creatinine is normal.    2. Atrial Fib:  Had some AF overnight.  Currently a-pacing.  Stopping the dopamine may help prevent further AFib.  Disposition: plan per TCTS.  She seems to be doing OK from a cardiac standpoint.  Length of Stay: 5  Vesta Mixer, Montez Hageman., MD, [facility] 04/06/2012, 9:46 AM

## 2012-04-06 NOTE — Progress Notes (Signed)
   CARE MANAGEMENT NOTE 04/06/2012  Patient:  [name], [name]   Account Number:  [id number]  Date Initiated:  04/05/2012  Documentation initiated by:  Ronny Flurry  Subjective/Objective Assessment:   S/p CABG on 04-03-12     Action/Plan:   PTA, PT INDEPENDENT, LIVES WITH SPOUSE.  SOMEWHAT SLOW TO PROGRESS; MAY NEED SNF AT DISCHARGE FOR REHAB.  WILL HAVE CSW FOLLOW UP WITH PT.   Anticipated DC Date:  04/10/2012   Anticipated DC Plan:  SKILLED NURSING FACILITY  In-house referral  Clinical Social Worker         Choice offered to / List presented to:             Status of service:  In process, will continue to follow Medicare Important Message given?   (If response is "NO", the following Medicare IM given date fields will be blank) Date Medicare IM given:   Date Additional Medicare IM given:    Discharge Disposition:    Per UR Regulation:  Reviewed for med. necessity/level of care/duration of stay  If discussed at Long Length of Stay Meetings, dates discussed:    Comments:    Jerrell Belfast, RN, BSN Phone #[phone]

## 2012-04-06 NOTE — Progress Notes (Signed)
CSW received referral for pt needing SNF placement. CSW unable to complete psychosocial assessment as pt having a procedure at time of visit. CSW will return to assess and will facilitate d/c planning as appropriate to pt medical progression. Baxter Flattery, MSW [phone]

## 2012-04-06 NOTE — Progress Notes (Signed)
3 Days Post-Op Procedure(s) (LRB): CORONARY ARTERY BYPASS GRAFTING (CABG) (N/A) MITRAL VALVE REPAIR (MVR) (N/A)                      [address]            [place] [address]          [phone]    Subjective:POstop CABG,MV repair- ischemic CM                      Postop A-fib ,fluid retnsion                      Weak,deconditioned Objective: Vital signs in last 24 hours: Temp:  [97.7 F (36.5 C)-98.1 F (36.7 C)] 97.9 F (36.6 C) (04/11 0738) Pulse Rate:  [25-109] 81  (04/11 0700) Cardiac Rhythm:  [-] Atrial paced (04/11 0600) Resp:  [13-26] 15  (04/11 0700) BP: (83-145)/(42-89) 117/61 mmHg (04/11 0700) SpO2:  [88 %-99 %] 96 % (04/11 0700) Weight:  [167 lb 12.3 oz (76.1 kg)] 167 lb 12.3 oz (76.1 kg) (04/11 0500)  Hemodynamic parameters for last 24 hours:  NSR this am BP 110 on renal dopa  Intake/Output from previous day: 04/10 0701 - 04/11 0700 In: 1634.9 [P.O.:30; I.V.:1138.4; Blood:312.5; IV Piggyback:154] Out: 1395 [Urine:1005; Chest Tube:390] Intake/Output this shift:  +400  Lab Results:  Basename 04/06/12 0430 04/05/12 0355  WBC 11.6* 9.9  HGB 9.2* 8.1*  HCT 27.3* 24.2*  PLT 160 148*   BMET:  Basename 04/06/12 0430 04/05/12 0355  NA 137 136  K 3.6 3.8  CL 105 104  CO2 23 23  GLUCOSE 107* 146*  BUN 24* 15  CREATININE 0.79 0.86  CALCIUM 8.4 8.4    PT/INR:  Basename 04/06/12 0430  LABPROT 14.4  INR 1.10   ABG    Component Value Date/Time   PHART 7.311* 04/04/2012 0439   HCO3 21.5 04/04/2012 0439   TCO2 22 04/04/2012 1717   ACIDBASEDEF 5.0* 04/04/2012 0439   O2SAT 96.0 04/04/2012 0439   CBG (last 3)   Basename 04/06/12 0737 04/06/12 0411 04/06/12 0013  GLUCAP 106* 91 102*    Assessment/Plan: S/P Procedure(s) (LRB): CORONARY ARTERY BYPASS GRAFTING (CABG) (N/A) MITRAL VALVE REPAIR (MVR) (N/A) PLAN- diuresis,cont renal dopa, start coumadin, PT ambulation   LOS: 11 days    VAN TRIGT III,Cindee Mclester 04/06/2012

## 2012-04-06 NOTE — Progress Notes (Signed)
*  PRELIMINARY RESULTS* Echocardiogram 2D Echocardiogram has been performed.  Katheren Puller 04/06/2012, 4:08 PM

## 2012-04-06 NOTE — Progress Notes (Signed)
Patient ID: [name], female   DOB: 09-08-1932, 78 y.o.   MRN: [id number]  Filed Vitals:   04/06/12 1633 04/06/12 1700 04/06/12 1800 04/06/12 1900  BP:  124/63 140/117 87/50  Pulse:  101 52 114  Temp: 98.8 F (37.1 C)     TempSrc: Oral     Resp:  16 23 22   Height:      Weight:      SpO2:  92% 96% 95%   Atrial fib in 90's now but was up to 130's earlier.  She received amio bolus.  Dopamine was decreased and then stopped but developed hypotension to SBP in 70's. She has diuresed well today and it is probably a combination of diuresis and stopping dopamine.  Will resume dopamine at 3, give some albumin, and add digoxin for added rate control.  Dr. Donata Clay can decide if he wants to continue digoxin tomorrow.

## 2012-04-06 NOTE — Evaluation (Signed)
Physical Therapy Evaluation Patient Details Name: [name] MRN: [id number] DOB: 1932/06/29 Today's Date: 04/06/2012  Problem List:  Patient Active Problem List  Diagnoses  . DM  . GLAUCOMA  . HYPERTENSION  . HEMORRHOIDS  . DEGENERATIVE JOINT DISEASE  . DIVERTICULITIS, HX OF  . Coronary artery disease  . Mitral regurgitation    Past Medical History:  Past Medical History  Diagnosis Date  . Diabetes mellitus   . Hypertension   . Depression   . CAD (coronary artery disease)     s/p PCI to LAD '08, Severe 3V CAD by cath 03/27/12 --> CABG  . Mitral regurgitation     moderate by cath, mild by echo 03/2012   Past Surgical History:  Past Surgical History  Procedure Date  . Coronary artery bypass graft 04/03/2012    Procedure: CORONARY ARTERY BYPASS GRAFTING (CABG);  Surgeon: Kerin Perna, MD;  Location: [facility];  Service: Open Heart Surgery;  Laterality: N/A;  CABG x four;  using left internal mammary artery and bilateral greater saphenous veins  . Mitral valve repair 04/03/2012    Procedure: MITRAL VALVE REPAIR (MVR);  Surgeon: Kerin Perna, MD;  Location: [facility] OR;  Service: Open Heart Surgery;  Laterality: N/A;    PT Assessment/Plan/Recommendation PT Assessment Clinical Impression Statement: [name] is 78 y/o female s/p CABG POD #3. Presents to PT with generalized weakness secondary to surgery affecting her mobility and independence. Will benefit physical therapy in the acute setting to address these and the following impairments so as to maximize her independence and decrease her burden of care at the next venue. At this point I would recommend SNF for follow up. Pending her progress and her family support at home she may be able to d/c home but will continue to update recommendations.  PT Recommendation/Assessment: Patient will need skilled PT in the acute care venue PT Problem List: Decreased strength;Decreased activity tolerance;Decreased balance;Decreased  mobility;Decreased knowledge of use of DME;Cardiopulmonary status limiting activity Barriers to Discharge: Decreased caregiver support Barriers to Discharge Comments: Husband walks with a cane. Unsure if daughters can stay with her to assist upon d/c.  PT Therapy Diagnosis : Difficulty walking;Abnormality of gait;Generalized weakness PT Plan PT Frequency: Min 3X/week PT Treatment/Interventions: DME instruction;Gait training;Stair training;Functional mobility training;Therapeutic exercise;Patient/family education;Neuromuscular re-education;Balance training PT Recommendation Follow Up Recommendations: Skilled nursing facility Equipment Recommended: Defer to next venue PT Goals  Acute Rehab PT Goals PT Goal Formulation: With patient Time For Goal Achievement: 2 weeks Pt will Roll Supine to Right Side: with modified independence PT Goal: Rolling Supine to Right Side - Progress: Goal set today Pt will Roll Supine to Left Side: with modified independence PT Goal: Rolling Supine to Left Side - Progress: Goal set today Pt will go Supine/Side to Sit: with modified independence PT Goal: Supine/Side to Sit - Progress: Goal set today Pt will go Sit to Supine/Side: with modified independence PT Goal: Sit to Supine/Side - Progress: Goal set today Pt will go Sit to Stand: with modified independence PT Goal: Sit to Stand - Progress: Goal set today Pt will go Stand to Sit: with modified independence PT Goal: Stand to Sit - Progress: Goal set today Pt will Transfer Bed to Chair/Chair to Bed: with modified independence PT Transfer Goal: Bed to Chair/Chair to Bed - Progress: Goal set today Pt will Ambulate: >150 feet;with modified independence;with least restrictive assistive device PT Goal: Ambulate - Progress: Goal set today Pt will Go Up / Down Stairs: 1-2 stairs;with  min assist;with least restrictive assistive device PT Goal: Up/Down Stairs - Progress: Goal set today Pt will Perform Home Exercise  Program: Independently PT Goal: Perform Home Exercise Program - Progress: Goal set today  PT Evaluation Precautions/Restrictions  Precautions Precautions: Sternal;Fall Prior Functioning  Home Living Lives With: Spouse Available Help at Discharge: Family (potential for daughters to help out on d/c) Type of Home: House Home Access: Stairs to enter Secretary/administrator of Steps: 1 Entrance Stairs-Rails: None Home Layout: One level Bathroom Shower/Tub: Engineer, manufacturing systems: Handicapped height Home Adaptive Equipment: Grab bars in shower Additional Comments: Husband ambulates with a cane, won't be able to physical assist her Prior Function Level of Independence: Independent Able to Take Stairs?: Yes Driving: Yes Vocation: Retired Financial risk analyst Arousal/Alertness: Awake/alert Overall Cognitive Status: Appears within functional limits for tasks assessed Orientation Level: Oriented X4 Sensation/Coordination Sensation Light Touch: Appears Intact Coordination Gross Motor Movements are Fluid and Coordinated: Yes Fine Motor Movements are Fluid and Coordinated: Yes Extremity Assessment RUE Assessment RUE Assessment: Not tested LUE Assessment LUE Assessment: Not tested RLE Assessment RLE Assessment: generally weak (grossly 4/5 with decreased endurance) LLE Assessment LLE Assessment: generally weak (see RLE)  Mobility (including Balance) Bed Mobility Bed Mobility: Yes Rolling Right: 4: Min assist Rolling Right Details (indicate cue type and reason): sequencing cues to bend knees;  min facilitation at trunk for initiation and for follow through Right Sidelying to Sit: 3: Mod assist;HOB elevated (comment degrees) (20 degrees) Right Sidelying to Sit Details (indicate cue type and reason): mod facilitation Sitting - Scoot to Edge of Bed: 3: Mod assist Sitting - Scoot to [place] of Bed Details (indicate cue type and reason): use of pad to assist in bringing left hip  forward for feet flat Transfers Transfers: Yes Sit to Stand: 1: +2 Total assist;Without upper extremity assist;From bed (70%) Sit to Stand Details (indicate cue type and reason): pt holding cardiac pillow; facilitation for anterior translation of trunk over BOS as well as for follow through to stand Stand to Sit: Without upper extremity assist;1: +2 Total assist;To chair/3-in-1 (75%) Stand to Sit Details: assist to slowly lower hips to chair Stand Pivot Transfers: 1: +2 Total assist (75%) Stand Pivot Transfer Details (indicate cue type and reason): bilateral facilitation around hips for stablity; pt side stepping very slowly and cautiously Ambulation/Gait Ambulation/Gait: No  Balance Balance Assessed: No Exercise  Total Joint Exercises Ankle Circles/Pumps: AROM;10 reps;Both;Supine End of Session PT - End of Session Equipment Utilized During Treatment: Gait belt Activity Tolerance: Patient tolerated treatment well (HR elevated and fluctuating (100-125, a-fib, RN aware)) Patient left: in chair;with call bell in reach Nurse Communication: Mobility status for transfers General Behavior During Session: [facility] for tasks performed Cognition: [facility] for tasks performed  [facility] HELEN 04/06/2012, 3:48 PM

## 2012-04-07 ENCOUNTER — Inpatient Hospital Stay (HOSPITAL_COMMUNITY): Payer: Medicare Other

## 2012-04-07 DIAGNOSIS — I4891 Unspecified atrial fibrillation: Secondary | ICD-10-CM

## 2012-04-07 DIAGNOSIS — I482 Chronic atrial fibrillation, unspecified: Secondary | ICD-10-CM

## 2012-04-07 LAB — TYPE AND SCREEN
ABO/RH(D): O NEG
Antibody Screen: POSITIVE
DAT, IgG: NEGATIVE
Donor AG Type: NEGATIVE
Unit division: 0
Unit division: 0
Unit division: 0

## 2012-04-07 LAB — CBC
HCT: 27.7 % — ABNORMAL LOW (ref 36.0–46.0)
Hemoglobin: 9.1 g/dL — ABNORMAL LOW (ref 12.0–15.0)
MCH: 26.5 pg (ref 26.0–34.0)
MCHC: 32.9 g/dL (ref 30.0–36.0)
MCV: 80.8 fL (ref 78.0–100.0)
Platelets: 184 10*3/uL (ref 150–400)
RBC: 3.43 MIL/uL — ABNORMAL LOW (ref 3.87–5.11)
RDW: 16.1 % — ABNORMAL HIGH (ref 11.5–15.5)
WBC: 11.1 10*3/uL — ABNORMAL HIGH (ref 4.0–10.5)

## 2012-04-07 LAB — BASIC METABOLIC PANEL
BUN: 22 mg/dL (ref 6–23)
CO2: 24 mEq/L (ref 19–32)
Calcium: 8.5 mg/dL (ref 8.4–10.5)
Chloride: 100 mEq/L (ref 96–112)
Creatinine, Ser: 0.69 mg/dL (ref 0.50–1.10)
GFR calc Af Amer: >90 mL/min (ref >90)
GFR calc non Af Amer: 81 mL/min — ABNORMAL LOW (ref >90)
Glucose, Bld: 116 mg/dL — ABNORMAL HIGH (ref 70–99)
Potassium: 3.3 mEq/L — ABNORMAL LOW (ref 3.5–5.1)
Sodium: 135 mEq/L (ref 135–145)

## 2012-04-07 LAB — GLUCOSE, CAPILLARY
Glucose-Capillary: 215 mg/dL — ABNORMAL HIGH (ref 70–99)
Glucose-Capillary: 153 mg/dL — ABNORMAL HIGH (ref 70–99)
Glucose-Capillary: 156 mg/dL — ABNORMAL HIGH (ref 70–99)

## 2012-04-07 MED ORDER — METOLAZONE 2.5 MG PO TABS
2.5000 mg | ORAL_TABLET | Freq: Every day | ORAL | Status: AC
  Administered 2012-04-07: 2.5 mg via ORAL
  Filled 2012-04-07: qty 1

## 2012-04-07 MED ORDER — AMIODARONE HCL IN DEXTROSE 360-4.14 MG/200ML-% IV SOLN
30.0000 mg/h | INTRAVENOUS | Status: DC
  Administered 2012-04-07: 30 mg/h via INTRAVENOUS
  Administered 2012-04-07: 60 mg/h via INTRAVENOUS
  Administered 2012-04-08: 30 mg/h via INTRAVENOUS
  Filled 2012-04-07 (×5): qty 200

## 2012-04-07 MED ORDER — LEVALBUTEROL HCL 0.63 MG/3ML IN NEBU
0.6300 mg | INHALATION_SOLUTION | Freq: Four times a day (QID) | RESPIRATORY_TRACT | Status: DC
  Administered 2012-04-07 (×3): 0.63 mg via RESPIRATORY_TRACT
  Filled 2012-04-07 (×5): qty 3

## 2012-04-07 MED ORDER — POTASSIUM CHLORIDE 10 MEQ/50ML IV SOLN
10.0000 meq | INTRAVENOUS | Status: AC | PRN
  Administered 2012-04-07 (×3): 10 meq via INTRAVENOUS
  Filled 2012-04-07: qty 100

## 2012-04-07 MED ORDER — POTASSIUM CHLORIDE 10 MEQ/50ML IV SOLN
INTRAVENOUS | Status: AC
  Administered 2012-04-07: 10 meq via INTRAVENOUS
  Filled 2012-04-07: qty 50

## 2012-04-07 MED ORDER — WARFARIN - PHYSICIAN DOSING INPATIENT
Freq: Every day | Status: DC
  Administered 2012-04-12: 18:00:00

## 2012-04-07 MED ORDER — GLUCERNA SHAKE PO LIQD
237.0000 mL | Freq: Every day | ORAL | Status: DC
  Administered 2012-04-07 – 2012-04-17 (×10): 237 mL via ORAL

## 2012-04-07 MED ORDER — AMIODARONE HCL IN DEXTROSE 360-4.14 MG/200ML-% IV SOLN
60.0000 mg/h | INTRAVENOUS | Status: AC
  Administered 2012-04-07: 60 mg/h via INTRAVENOUS
  Filled 2012-04-07 (×2): qty 200

## 2012-04-07 MED ORDER — WARFARIN SODIUM 2.5 MG PO TABS
2.5000 mg | ORAL_TABLET | Freq: Every day | ORAL | Status: DC
  Administered 2012-04-07 – 2012-04-12 (×6): 2.5 mg via ORAL
  Filled 2012-04-07 (×7): qty 1

## 2012-04-07 MED ORDER — AMIODARONE LOAD VIA INFUSION
150.0000 mg | Freq: Once | INTRAVENOUS | Status: AC
  Administered 2012-04-07: 150 mg via INTRAVENOUS
  Filled 2012-04-07: qty 83.34

## 2012-04-07 MED ORDER — FUROSEMIDE 10 MG/ML IJ SOLN
40.0000 mg | Freq: Once | INTRAMUSCULAR | Status: AC
  Administered 2012-04-07: 40 mg via INTRAVENOUS
  Filled 2012-04-07: qty 4

## 2012-04-07 MED FILL — Heparin Sodium (Porcine) Inj 1000 Unit/ML: INTRAMUSCULAR | Qty: 60 | Status: AC

## 2012-04-07 MED FILL — Albumin, Human Inj 5%: INTRAVENOUS | Qty: 250 | Status: AC

## 2012-04-07 MED FILL — Sodium Chloride Irrigation Soln 0.9%: Qty: 3000 | Status: AC

## 2012-04-07 MED FILL — Sodium Chloride IV Soln 0.9%: INTRAVENOUS | Qty: 1000 | Status: AC

## 2012-04-07 MED FILL — Mannitol IV Soln 20%: INTRAVENOUS | Qty: 500 | Status: AC

## 2012-04-07 MED FILL — Lidocaine HCl IV Inj 20 MG/ML: INTRAVENOUS | Qty: 5 | Status: AC

## 2012-04-07 MED FILL — Heparin Sodium (Porcine) Inj 1000 Unit/ML: INTRAMUSCULAR | Qty: 20 | Status: AC

## 2012-04-07 MED FILL — Sodium Bicarbonate IV Soln 8.4%: INTRAVENOUS | Qty: 100 | Status: AC

## 2012-04-07 MED FILL — Electrolyte-R (PH 7.4) Solution: INTRAVENOUS | Qty: 6000 | Status: AC

## 2012-04-07 NOTE — Progress Notes (Signed)
4 Days Post-Op Procedure(s) (LRB): CORONARY ARTERY BYPASS GRAFTING (CABG) (N/A) MITRAL VALVE REPAIR (MVR) (N/A) Subjective: CABG x4 and mitral valve repair for ischemic cardiomyopathy                        [address]            [place] [address]          [phone]    Postoperative atrial fibrillation recurrent now back on IV amiodarone Full started by mouth Coumadin and check daily INR Still on dopamine due to low EF and transient low blood pressure Gaining strength and physical therapy   Objective: Vital signs in last 24 hours: Temp:  [97.7 F (36.5 C)-98.4 F (36.9 C)] 97.8 F (36.6 C) (04/12 1550) Pulse Rate:  [52-114] 92  (04/12 1700) Cardiac Rhythm:  [-] Atrial fibrillation (04/12 1700) Resp:  [16-24] 19  (04/12 1700) BP: (85-143)/(43-117) 102/60 mmHg (04/12 1700) SpO2:  [92 %-97 %] 94 % (04/12 1700) Weight:  [164 lb 10.9 oz (74.7 kg)] 164 lb 10.9 oz (74.7 kg) (04/12 0500)  Hemodynamic parameters for last 24 hours:   atrial fibrillation rate controlled  Intake/Output from previous day: 04/11 0701 - 04/12 0700 In: 872.2 [P.O.:300; I.V.:422.2; IV Piggyback:150] Out: 2550 [Urine:2550] Intake/Output this shift: Total I/O In: 594.3 [P.O.:120; I.V.:424.3; IV Piggyback:50] Out: 1135 [Urine:1135]  Exam alert, stronger Breath sounds clear Cardiac rhythm irregular no cardiac murmur Extremities warm no edema  Lab Results:  Basename 04/07/12 0400 04/06/12 0430  WBC 11.1* 11.6*  HGB 9.1* 9.2*  HCT 27.7* 27.3*  PLT 184 160   BMET:  Basename 04/07/12 0400 04/06/12 0430  NA 135 137  K 3.3* 3.6  CL 100 105  CO2 24 23  GLUCOSE 116* 107*  BUN 22 24*  CREATININE 0.69 0.79  CALCIUM 8.5 8.4    PT/INR:  Basename 04/06/12 0430  LABPROT 14.4  INR 1.10   ABG    Component Value Date/Time   PHART 7.311* 04/04/2012 0439   HCO3 21.5 04/04/2012 0439   TCO2 22 04/04/2012 1717   ACIDBASEDEF 5.0* 04/04/2012 0439   O2SAT 96.0 04/04/2012 0439   CBG (last 3)    Basename 04/07/12 1551 04/07/12 1139 04/07/12 0737  GLUCAP 153* 215* 132*    Assessment/Plan: S/P Procedure(s) (LRB): CORONARY ARTERY BYPASS GRAFTING (CABG) (N/A) MITRAL VALVE REPAIR (MVR) (N/A) Plan--  start oral Coumadin, resume IV amiodarone until she converted back to sinus rhythm, continue physical therapy, wean dopamine   LOS: 12 days    VAN TRIGT III,Tamaira Ciriello 04/07/2012

## 2012-04-07 NOTE — Progress Notes (Signed)
PROGRESS NOTE  Subjective:   [name] is a 78 yo with hx of CAD - s/p CABG.    Objective:    Vital Signs:   Temp:  [97.6 F (36.4 C)-98.8 F (37.1 C)] 97.7 F (36.5 C) (04/12 0400) Pulse Rate:  [52-114] 108  (04/12 0700) Resp:  [14-23] 23  (04/12 0700) BP: (85-143)/(50-117) 120/78 mmHg (04/12 0700) SpO2:  [92 %-97 %] 97 % (04/12 0700) Weight:  [164 lb 10.9 oz (74.7 kg)] 164 lb 10.9 oz (74.7 kg) (04/12 0500)  Last BM Date: 04/02/12   24-hour weight change: Weight change: -3 lb 1.4 oz (-1.4 kg)  Weight trends: Filed Weights   04/05/12 0500 04/06/12 0500 04/07/12 0500  Weight: 166 lb 14.2 oz (75.7 kg) 167 lb 12.3 oz (76.1 kg) 164 lb 10.9 oz (74.7 kg)    Intake/Output:  04/11 0701 - 04/12 0700 In: 872.2 [P.O.:300; I.V.:422.2; IV Piggyback:150] Out: 2550 [Urine:2550]     Physical Exam: BP 120/78  Pulse 108  Temp(Src) 97.7 F (36.5 C) (Oral)  Resp 23  Ht 5\' 8"  (1.727 m)  Wt 164 lb 10.9 oz (74.7 kg)  BMI 25.04 kg/m2  SpO2 97%  General: Vital signs reviewed and noted. Well-developed, well-nourished, in no acute distress; alert, appropriate and cooperative throughout examination.  Head: Normocephalic, atraumatic.  Eyes: conjunctivae/corneas clear. PERRL, EOM's intact. Fundi benign.  Throat: Oropharynx nonerythematous, no exudate appreciated.  Right IJ swan sleave in place  Neck: Supple. Normal carotids. No JVD  Lungs:  Bilateral wheezing.   Chest tube in place  Heart: Irregularly irregular,  tachy   Abdomen:  Soft, non-tender, non-distended with normoactive bowel sounds. No hepatomegaly. No rebound/guarding. No abdominal masses.  Extremities: No edema.  Distal pedal pulses are 2+ and equal bilaterally.  Neurologic: A&O X3, CN II - XII are grossly intact. Motor strength is 5/5 in the all 4 extremities.  Psych: Responds to questions appropriately with normal affect.    Labs: BMET:  Basename 04/07/12 0400 04/06/12 0430 04/04/12 1736  NA 135 137 --  K 3.3* 3.6  --  CL 100 105 --  CO2 24 23 --  GLUCOSE 116* 107* --  BUN 22 24* --  CREATININE 0.69 0.79 --  CALCIUM 8.5 8.4 --  MG -- -- 2.5  PHOS -- -- --    Liver function tests:  Basename 04/06/12 0430  AST 22  ALT 33  ALKPHOS 58  BILITOT 0.4  PROT 5.3*  ALBUMIN 2.7*   No results found for this basename: LIPASE:2,AMYLASE:2 in the last 72 hours  CBC:  Basename 04/07/12 0400 04/06/12 0430  WBC 11.1* 11.6*  NEUTROABS -- --  HGB 9.1* 9.2*  HCT 27.7* 27.3*  MCV 80.8 79.6  PLT 184 160    Cardiac Enzymes: No results found for this basename: CKTOTAL:4,CKMB:4,TROPONINI:4 in the last 72 hours  Coagulation Studies:  Basename 04/06/12 0430  LABPROT 14.4  INR 1.10    Other:   Tele:  A-fib   Medications:    Infusions:    . DOPamine 3 mcg/kg/min (04/07/12 0700)  . lactated ringers 20 mL/hr at 04/06/12 0453    Scheduled Medications:    . acetaminophen  1,000 mg Oral Q6H   Or  . acetaminophen (TYLENOL) oral liquid 160 mg/5 mL  975 mg Per Tube Q6H  . albumin human  12.5 g Intravenous Once  . amiodarone (NEXTERONE) IV bolus only 150 mg/100 mL  150 mg Intravenous Once  . amiodarone  400 mg Oral  BID  . aspirin EC  325 mg Oral Daily  . atorvastatin  40 mg Oral q1800  . bisacodyl  10 mg Oral Daily   Or  . bisacodyl  10 mg Rectal Daily  . digoxin  0.25 mg Intravenous Once  . digoxin  0.5 mg Intravenous Once  . docusate sodium  200 mg Oral Daily  . enoxaparin  30 mg Subcutaneous Q24H  . FLUoxetine  20 mg Oral Daily  . furosemide  40 mg Intravenous Once  . insulin aspart  0-24 Units Subcutaneous Q4H  . metolazone  5 mg Oral Daily  . pantoprazole  40 mg Oral Q1200  . sodium chloride  10-40 mL Intracatheter Q12H  . sodium chloride  3 mL Intravenous Q12H  . DISCONTD: amiodarone  200 mg Oral BID  . DISCONTD: metoprolol tartrate  12.5 mg Per Tube BID  . DISCONTD: metoprolol tartrate  12.5 mg Oral BID    Assessment/ Plan:    1. CAD: s/p CABG and MR repair.   HR and BP  are OK.  Still on dopamine for ? Renal perfusion.  Creatinine is normal.    2. Atrial Fib:  Back in AF.  Will start amiodarone drip.  The dopamine was stopped yesterday for a while in an effort to minimize her tachycardia but she developed hypotension.  3. Wheezing:  Will give neb today.  Disposition: plan per TCTS.[name]  Length of Stay: 12  Vesta Mixer, Montez Hageman., MD, [facility] 04/07/2012, 7:13 AM

## 2012-04-07 NOTE — Progress Notes (Signed)
INITIAL ADULT NUTRITION ASSESSMENT Date: 04/07/2012   Time: 12:47 PM  Reason for Assessment: poor PO intake  ASSESSMENT: Female 78 y.o.  Dx: acute coronary disease, s/p CABG  Hx:  Past Medical History  Diagnosis Date  . Diabetes mellitus   . Hypertension   . Depression   . CAD (coronary artery disease)     s/p PCI to LAD '08, Severe 3V CAD by cath 03/27/12 --> CABG  . Mitral regurgitation     moderate by cath, mild by echo 03/2012    Related Meds:     . acetaminophen  1,000 mg Oral Q6H   Or  . acetaminophen (TYLENOL) oral liquid 160 mg/5 mL  975 mg Per Tube Q6H  . albumin human  12.5 g Intravenous Once  . amiodarone (NEXTERONE) IV bolus only 150 mg/100 mL  150 mg Intravenous Once  . amiodarone  150 mg Intravenous Once  . amiodarone  400 mg Oral BID  . aspirin EC  325 mg Oral Daily  . atorvastatin  40 mg Oral q1800  . bisacodyl  10 mg Oral Daily   Or  . bisacodyl  10 mg Rectal Daily  . digoxin  0.25 mg Intravenous Once  . digoxin  0.5 mg Intravenous Once  . docusate sodium  200 mg Oral Daily  . enoxaparin  30 mg Subcutaneous Q24H  . FLUoxetine  20 mg Oral Daily  . furosemide  40 mg Intravenous Once  . insulin aspart  0-24 Units Subcutaneous Q4H  . levalbuterol  0.63 mg Nebulization Q6H  . metolazone  2.5 mg Oral Daily  . pantoprazole  40 mg Oral Q1200  . sodium chloride  10-40 mL Intracatheter Q12H  . sodium chloride  3 mL Intravenous Q12H  . DISCONTD: amiodarone  200 mg Oral BID  . DISCONTD: metoprolol tartrate  12.5 mg Per Tube BID  . DISCONTD: metoprolol tartrate  12.5 mg Oral BID    Ht: 5\' 8"  (172.7 cm)  Wt: 164 lb 10.9 oz (74.7 kg)  Ideal Wt: 63.6 kg % Ideal Wt: 117%  Usual Wt: --- % Usual Wt: ---  Body mass index is 25.04 kg/(m^2).  Food/Nutrition Related Hx: no triggers per admission nutrition screen  Labs:  CMP     Component Value Date/Time   NA 135 04/07/2012 0400   K 3.3* 04/07/2012 0400   CL 100 04/07/2012 0400   CO2 24 04/07/2012 0400   GLUCOSE 116* 04/07/2012 0400   BUN 22 04/07/2012 0400   CREATININE 0.69 04/07/2012 0400   CALCIUM 8.5 04/07/2012 0400   PROT 5.3* 04/06/2012 0430   ALBUMIN 2.7* 04/06/2012 0430   AST 22 04/06/2012 0430   ALT 33 04/06/2012 0430   ALKPHOS 58 04/06/2012 0430   BILITOT 0.4 04/06/2012 0430   GFRNONAA 81* 04/07/2012 0400   GFRAA >90 04/07/2012 0400     Intake/Output Summary (Last 24 hours) at 04/07/12 1250 Last data filed at 04/07/12 1200  Gross per 24 hour  Intake 1139.5 ml  Output   2920 ml  Net -1780.5 ml    CBG (last 3)   Basename 04/07/12 1139 04/07/12 0737 04/07/12 0416  GLUCAP 215* 132* 115*    Diet Order: Carbohydrate Modified Medium Calorie  Supplements/Tube Feeding: N/A  IVF:    amiodarone (NEXTERONE PREMIX) 360 mg/200 mL dextrose Last Rate: 60 mg/hr (04/07/12 0900)  And   amiodarone (NEXTERONE PREMIX) 360 mg/200 mL dextrose Last Rate: 60 mg/hr (04/07/12 1227)  DOPamine Last Rate: 3 mcg/kg/min (04/07/12 0900)  lactated ringers Last Rate: 20 mL/hr at 04/06/12 0453    Estimated Nutritional Needs:   Kcal: 1700-1900 Protein: 80-90 gm Fluid: 1.7-1.9 L  RD spoke with pt re: nutrition hx -- s/p cardiac cath 4/1, CABG 4/9; states her appetite is "pretty good;" no recent weight loss reported; does endorse having trouble swallowing solids and liquids; no % PO intake recorded in flowsheet records; pt with several surgical incisions; would benefit from addition of nutrition supplements -- pt amenable -- RD to order.  NUTRITION DIAGNOSIS: -Inadequate oral intake (NI-2.1).  Status: Ongoing  RELATED TO: limited appetite  AS EVIDENCE BY: pt report  MONITORING/EVALUATION(Goals): Goal: meet >90% of estimated nutrition needs to promote post-op healing Monitor: PO intake, labs, weight, I/O's  EDUCATION NEEDS: -No education needs identified at this time  INTERVENTION:  Glucerna Shake PO daily (220 kcals, 9.9 gm protein)  RD to follow for nutrition care plan  Dietitian #:  [phone]  DOCUMENTATION CODES Per approved criteria  -Not Applicable    Alger Memos 04/07/2012, 12:47 PM

## 2012-04-07 NOTE — Progress Notes (Signed)
Clinical Social Work Department BRIEF PSYCHOSOCIAL ASSESSMENT 04/07/2012  Patient:  [name], [name]     Account Number:  [id number]     Admit date:  03/26/2012  Clinical Social Worker:  Mee Hives  Date/Time:  04/07/2012 03:30 PM  Referred by:  Care Management  Date Referred:  04/06/2012 Referred for  SNF Placement   Other Referral:   Interview type:  Patient Other interview type:   Also spoke with pt daughter by phone    PSYCHOSOCIAL DATA Living Status:  HUSBAND Admitted from facility:   Level of care:   Primary support name:  Doroteo Glassman Primary support relationship to patient:  SPOUSE Degree of support available:   Strong    CURRENT CONCERNS Current Concerns  Post-Acute Placement   Other Concerns:    SOCIAL WORK ASSESSMENT / PLAN Pt states she lives at home with her husband, who is able to provide somewhat limited assistance due to a recent knee injury. Pt unsure as to "what her family has in mind" with re to d/c planning.  CSW spoke with pt duaghter who states she, her sister, and pt nieces are able to provide care for pt at time of d/c. Pt daughter will be away from 4/20-4/25, but can provide full-time care outside of that time. Pt other family able to fill in, per pt daughter.  CSW discussed level of care pt may require and pt current state of deconditioning. Discussed availability of home health PT/RN, etc. Encouraged pt daughter to discuss care needs with family and consider ST rehab as an alternative if family is not comfortable with the demands.   Assessment/plan status:  Other - See comment Other assessment/ plan:   CSW will look for PT notes Monday and follow up with pt daughter, will initiate SNF search as backup at that time if pt daughter is willing.   Information/referral to community resources:    PATIENT'S/FAMILY'S RESPONSE TO PLAN OF CARE: Pt and pt daughter both receptive and appreciative.

## 2012-04-08 ENCOUNTER — Inpatient Hospital Stay (HOSPITAL_COMMUNITY): Payer: Medicare Other

## 2012-04-08 LAB — CBC
HCT: 27.8 % — ABNORMAL LOW (ref 36.0–46.0)
Hemoglobin: 9.4 g/dL — ABNORMAL LOW (ref 12.0–15.0)
MCH: 27.5 pg (ref 26.0–34.0)
MCHC: 33.8 g/dL (ref 30.0–36.0)
MCV: 81.3 fL (ref 78.0–100.0)
Platelets: 214 10*3/uL (ref 150–400)
RBC: 3.42 MIL/uL — ABNORMAL LOW (ref 3.87–5.11)
RDW: 16.3 % — ABNORMAL HIGH (ref 11.5–15.5)
WBC: 8.7 10*3/uL (ref 4.0–10.5)

## 2012-04-08 LAB — BASIC METABOLIC PANEL
BUN: 21 mg/dL (ref 6–23)
CO2: 26 mEq/L (ref 19–32)
Calcium: 8.3 mg/dL — ABNORMAL LOW (ref 8.4–10.5)
Chloride: 97 mEq/L (ref 96–112)
Creatinine, Ser: 0.78 mg/dL (ref 0.50–1.10)
GFR calc Af Amer: >90 mL/min (ref >90)
GFR calc non Af Amer: 78 mL/min — ABNORMAL LOW (ref >90)
Glucose, Bld: 140 mg/dL — ABNORMAL HIGH (ref 70–99)
Potassium: 3.3 mEq/L — ABNORMAL LOW (ref 3.5–5.1)
Sodium: 134 mEq/L — ABNORMAL LOW (ref 135–145)

## 2012-04-08 LAB — PROTIME-INR
INR: 1.03 (ref 0.00–1.49)
Prothrombin Time: 13.7 seconds (ref 11.6–15.2)

## 2012-04-08 LAB — GLUCOSE, CAPILLARY
Glucose-Capillary: 103 mg/dL — ABNORMAL HIGH (ref 70–99)
Glucose-Capillary: 133 mg/dL — ABNORMAL HIGH (ref 70–99)
Glucose-Capillary: 149 mg/dL — ABNORMAL HIGH (ref 70–99)

## 2012-04-08 MED ORDER — DEXTROSE 5 % IV SOLN
30.0000 mg/h | INTRAVENOUS | Status: DC
  Administered 2012-04-08: 30 mg/h via INTRAVENOUS
  Filled 2012-04-08 (×2): qty 9

## 2012-04-08 MED ORDER — ALBUMIN HUMAN 5 % IV SOLN
12.5000 g | Freq: Once | INTRAVENOUS | Status: AC
  Administered 2012-04-08: 12.5 g via INTRAVENOUS
  Filled 2012-04-08: qty 250

## 2012-04-08 MED ORDER — METRONIDAZOLE 250 MG PO TABS
250.0000 mg | ORAL_TABLET | Freq: Three times a day (TID) | ORAL | Status: DC
  Administered 2012-04-08 – 2012-04-12 (×12): 250 mg via ORAL
  Filled 2012-04-08 (×15): qty 1

## 2012-04-08 MED ORDER — POTASSIUM CHLORIDE 10 MEQ/50ML IV SOLN
10.0000 meq | INTRAVENOUS | Status: AC
  Administered 2012-04-08 (×3): 10 meq via INTRAVENOUS
  Filled 2012-04-08: qty 50

## 2012-04-08 MED ORDER — FUROSEMIDE 10 MG/ML IJ SOLN
40.0000 mg | Freq: Once | INTRAMUSCULAR | Status: AC
  Administered 2012-04-08: 40 mg via INTRAVENOUS
  Filled 2012-04-08: qty 4

## 2012-04-08 MED ORDER — PATIENT'S GUIDE TO USING COUMADIN BOOK
Freq: Once | Status: AC
  Administered 2012-04-08: 18:00:00
  Filled 2012-04-08: qty 1

## 2012-04-08 MED ORDER — LEVALBUTEROL HCL 0.63 MG/3ML IN NEBU
0.6300 mg | INHALATION_SOLUTION | Freq: Four times a day (QID) | RESPIRATORY_TRACT | Status: DC | PRN
  Filled 2012-04-08: qty 3

## 2012-04-08 MED ORDER — WARFARIN VIDEO: Freq: Once | Status: DC

## 2012-04-08 MED ORDER — AMIODARONE LOAD VIA INFUSION
150.0000 mg | Freq: Once | INTRAVENOUS | Status: AC
  Administered 2012-04-08: 150 mg via INTRAVENOUS
  Filled 2012-04-08: qty 83.34

## 2012-04-08 MED ORDER — DEXTROSE 5 % IV SOLN: 150.0000 mg | Freq: Once | INTRAVENOUS | Status: DC

## 2012-04-08 NOTE — Progress Notes (Signed)
SICU p.m. Rounds  Postop CABG mitral valve repair, low EF Atrial fibrillation back on IV amiodarone 12 hours of diarrhea improved, C. difficile negative Week but ambulated greater than 100 feet Starting Coumadin Urine output low but she appeared dehydrated

## 2012-04-08 NOTE — Progress Notes (Signed)
PROGRESS NOTE  Subjective:   [name] is a 78 yo with hx of CAD with iCM - s/p CABG and MV repair on 4/8.   Had diarrhea yesterday. No better. Continues on IV amio for post-op AF. Feels weak. Urine output trailing off.     Objective:    Vital Signs:   Temp:  [97.4 F (36.3 C)-97.9 F (36.6 C)] 97.4 F (36.3 C) (04/13 0802) Pulse Rate:  [40-106] 77  (04/13 1100) Resp:  [17-28] 25  (04/13 1100) BP: (77-131)/(43-92) 106/57 mmHg (04/13 1100) SpO2:  [92 %-98 %] 92 % (04/13 1100) Weight:  [74.7 kg (164 lb 10.9 oz)] 74.7 kg (164 lb 10.9 oz) (04/13 0600)  Last BM Date: 04/08/12   24-hour weight change: Weight change: 0 kg (0 lb)  Weight trends: Filed Weights   Apr 26, 2012 0500 04/07/12 0500 04/08/12 0600  Weight: 76.1 kg (167 lb 12.3 oz) 74.7 kg (164 lb 10.9 oz) 74.7 kg (164 lb 10.9 oz)   General:  Frail. Weak  No resp difficulty HEENT: normal Neck: supple JVP up Carotids 2+ bilat; no bruits. No lymphadenopathy or thryomegaly appreciated. Cor:  Irregular rate & rhythm. No rubs, gallops or murmurs. Chest incision c/d/i Lungs: mild rhonchi Abdomen: soft, nontender, nondistended. No hepatosplenomegaly. No bruits or masses. Good bowel sounds. Extremities: no cyanosis, clubbing, rash, tr -1+ edema Neuro: alert & orientedx3, cranial nerves grossly intact. moves all 4 extremities w/o difficulty. Affect pleasant  Intake/Output:  04/12 0701 - 04/13 0700 In: 1233.1 [P.O.:480; I.V.:653.1; IV Piggyback:100] Out: 1510 [Urine:1510] Total I/O In: 355.8 [P.O.:125; I.V.:130.8; IV Piggyback:100] Out: 40 [Urine:40]   Physical Exam: BP 106/57  Pulse 77  Temp(Src) 97.4 F (36.3 C) (Oral)  Resp 25  Ht 5\' 8"  (1.727 m)  Wt 74.7 kg (164 lb 10.9 oz)  BMI 25.04 kg/m2  SpO2 92%    Labs: BMET:  Basename 04/08/12 0400 04/07/12 0400  NA 134* 135  K 3.3* 3.3*  CL 97 100  CO2 26 24  GLUCOSE 140* 116*  BUN 21 22  CREATININE 0.78 0.69  CALCIUM 8.3* 8.5  MG -- --  PHOS -- --     Liver function tests:  Basename 04-26-2012 0430  AST 22  ALT 33  ALKPHOS 58  BILITOT 0.4  PROT 5.3*  ALBUMIN 2.7*   No results found for this basename: LIPASE:2,AMYLASE:2 in the last 72 hours  CBC:  Basename 04/08/12 0400 04/07/12 0400  WBC 8.7 11.1*  NEUTROABS -- --  HGB 9.4* 9.1*  HCT 27.8* 27.7*  MCV 81.3 80.8  PLT 214 184    Cardiac Enzymes: No results found for this basename: CKTOTAL:4,CKMB:4,TROPONINI:4 in the last 72 hours  Coagulation Studies:  Basename 04/08/12 0400 26-Apr-2012 0430  LABPROT 13.7 14.4  INR 1.03 1.10    Other:   Tele:  A-fib 100-110   Medications:    Infusions:    . amiodarone (CORDARONE) infusion    . amiodarone (NEXTERONE PREMIX) 360 mg/200 mL dextrose 60 mg/hr (04/07/12 0900)  . lactated ringers 20 mL/hr at Apr 26, 2012 0453  . DISCONTD: amiodarone (NEXTERONE PREMIX) 360 mg/200 mL dextrose 30 mg/hr (04/08/12 0755)  . DISCONTD: DOPamine 3 mcg/kg/min (04/07/12 0900)    Scheduled Medications:    . acetaminophen  1,000 mg Oral Q6H   Or  . acetaminophen (TYLENOL) oral liquid 160 mg/5 mL  975 mg Per Tube Q6H  . aspirin EC  325 mg Oral Daily  . atorvastatin  40 mg Oral q1800  . bisacodyl  10 mg Oral Daily   Or  . bisacodyl  10 mg Rectal Daily  . docusate sodium  200 mg Oral Daily  . enoxaparin  30 mg Subcutaneous Q24H  . feeding supplement  237 mL Oral Daily  . FLUoxetine  20 mg Oral Daily  . insulin aspart  0-24 Units Subcutaneous Q4H  . metroNIDAZOLE  250 mg Oral Q8H  . pantoprazole  40 mg Oral Q1200  . potassium chloride  10 mEq Intravenous Q1 Hr x 3  . sodium chloride  10-40 mL Intracatheter Q12H  . sodium chloride  3 mL Intravenous Q12H  . warfarin  2.5 mg Oral q1800  . Warfarin - Physician Dosing Inpatient   Does not apply q1800  . DISCONTD: amiodarone  400 mg Oral BID  . DISCONTD: levalbuterol  0.63 mg Nebulization Q6H    Assessment/ Plan:    1. CAD: s/p CABG and MR repair.    2. Atrial Fib:  Back in AF.   Continue amiodarone drip.    3. CHF, acute systolic EF 35%  4. NSTEMI  Disposition:  She is improving slowly. Renal function stable. Weight still up a few pounds from pre-CABG weight. Can give lasix as needed. Continue IV amio will give another bolus now to help with rate control.  BP too low currently for b-blocker or ACE-I/ARB.   Length of Stay: 13  Kaoru Benda,MD 12:09 PM

## 2012-04-08 NOTE — Progress Notes (Signed)
5 Days Post-Op Procedure(s) (LRB): CORONARY ARTERY BYPASS GRAFTING (CABG) (N/A) MITRAL VALVE REPAIR (MVR) (N/A)                     [address]            [place] [address]          [phone]      Subjective: S/P CABG mitral repair Postop a-fib on iv amio, coumadin Recent onset diarrheea prob cdiff abd soft but hyperactive bs Dopamine now off  Objective: Vital signs in last 24 hours: Temp:  [97.4 F (36.3 C)-97.9 F (36.6 C)] 97.4 F (36.3 C) (04/13 0802) Pulse Rate:  [40-106] 46  (04/13 0900) Cardiac Rhythm:  [-] Atrial fibrillation (04/13 1000) Resp:  [16-28] 23  (04/13 0900) BP: (77-131)/(43-92) 103/59 mmHg (04/13 1000) SpO2:  [93 %-98 %] 97 % (04/13 0900) Weight:  [164 lb 10.9 oz (74.7 kg)] 164 lb 10.9 oz (74.7 kg) (04/13 0600)  Hemodynamic parameters for last 24 hours:  afibIntake/Output from previous day: 04/12 0701 - 04/13 0700 In: 1233.1 [P.O.:480; I.V.:653.1; IV Piggyback:100] Out: 1510 [Urine:1510] Intake/Output this shift: Total I/O In: 275.1 [P.O.:125; I.V.:50.1; IV Piggyback:100] Out: 20 [Urine:20]  EXAm no murmur,extrem warm abd sl dsitended  Lab Results:  Basename 04/08/12 0400 04/07/12 0400  WBC 8.7 11.1*  HGB 9.4* 9.1*  HCT 27.8* 27.7*  PLT 214 184   BMET:  Basename 04/08/12 0400 04/07/12 0400  NA 134* 135  K 3.3* 3.3*  CL 97 100  CO2 26 24  GLUCOSE 140* 116*  BUN 21 22  CREATININE 0.78 0.69  CALCIUM 8.3* 8.5    PT/INR:  Basename 04/08/12 0400  LABPROT 13.7  INR 1.03   ABG    Component Value Date/Time   PHART 7.311* 04/04/2012 0439   HCO3 21.5 04/04/2012 0439   TCO2 22 04/04/2012 1717   ACIDBASEDEF 5.0* 04/04/2012 0439   O2SAT 96.0 04/04/2012 0439   CBG (last 3)   Basename 04/08/12 0758 04/08/12 0412 04/08/12 0012  GLUCAP 158* 149* 103*    Assessment/Plan: S/P Procedure(s) (LRB): CORONARY ARTERY BYPASS GRAFTING (CABG) (N/A) MITRAL VALVE REPAIR (MVR) (N/A) PLAN- start po flagyl pending cdiff toxin, cont iv  amio   LOS: 13 days    VAN TRIGT III,Jiovani Mccammon 04/08/2012

## 2012-04-09 ENCOUNTER — Inpatient Hospital Stay (HOSPITAL_COMMUNITY): Payer: Medicare Other

## 2012-04-09 LAB — COMPREHENSIVE METABOLIC PANEL
ALT: 28 U/L (ref 0–35)
AST: 30 U/L (ref 0–37)
Albumin: 3.1 g/dL — ABNORMAL LOW (ref 3.5–5.2)
Alkaline Phosphatase: 78 U/L (ref 39–117)
BUN: 30 mg/dL — ABNORMAL HIGH (ref 6–23)
CO2: 26 mEq/L (ref 19–32)
Calcium: 8.8 mg/dL (ref 8.4–10.5)
Chloride: 94 mEq/L — ABNORMAL LOW (ref 96–112)
Creatinine, Ser: 1.11 mg/dL — ABNORMAL HIGH (ref 0.50–1.10)
GFR calc Af Amer: 54 mL/min — ABNORMAL LOW (ref >90)
GFR calc non Af Amer: 46 mL/min — ABNORMAL LOW (ref >90)
Glucose, Bld: 132 mg/dL — ABNORMAL HIGH (ref 70–99)
Potassium: 3.8 mEq/L (ref 3.5–5.1)
Sodium: 131 mEq/L — ABNORMAL LOW (ref 135–145)
Total Bilirubin: 0.7 mg/dL (ref 0.3–1.2)
Total Protein: 6.1 g/dL (ref 6.0–8.3)

## 2012-04-09 LAB — CBC
HCT: 29.2 % — ABNORMAL LOW (ref 36.0–46.0)
Hemoglobin: 9.7 g/dL — ABNORMAL LOW (ref 12.0–15.0)
MCH: 26.6 pg (ref 26.0–34.0)
MCHC: 33.2 g/dL (ref 30.0–36.0)
MCV: 80 fL (ref 78.0–100.0)
Platelets: 193 10*3/uL (ref 150–400)
RBC: 3.65 MIL/uL — ABNORMAL LOW (ref 3.87–5.11)
RDW: 16.5 % — ABNORMAL HIGH (ref 11.5–15.5)
WBC: 12.2 10*3/uL — ABNORMAL HIGH (ref 4.0–10.5)

## 2012-04-09 LAB — GLUCOSE, CAPILLARY
Glucose-Capillary: 141 mg/dL — ABNORMAL HIGH (ref 70–99)
Glucose-Capillary: 190 mg/dL — ABNORMAL HIGH (ref 70–99)
Glucose-Capillary: 129 mg/dL — ABNORMAL HIGH (ref 70–99)
Glucose-Capillary: 225 mg/dL — ABNORMAL HIGH (ref 70–99)

## 2012-04-09 LAB — PROTIME-INR
INR: 1.12 (ref 0.00–1.49)
Prothrombin Time: 14.6 seconds (ref 11.6–15.2)

## 2012-04-09 MED ORDER — METOLAZONE 2.5 MG PO TABS
2.5000 mg | ORAL_TABLET | Freq: Every day | ORAL | Status: AC
  Administered 2012-04-09: 2.5 mg via ORAL
  Filled 2012-04-09: qty 1

## 2012-04-09 MED ORDER — SODIUM CHLORIDE 0.9 % IV SOLN
INTRAVENOUS | Status: DC
Start: 2012-04-09 — End: 2012-04-10
  Administered 2012-04-09: 20:00:00 via INTRAVENOUS

## 2012-04-09 MED ORDER — FUROSEMIDE 10 MG/ML IJ SOLN
40.0000 mg | Freq: Once | INTRAMUSCULAR | Status: AC
  Administered 2012-04-09: 40 mg via INTRAVENOUS
  Filled 2012-04-09: qty 4

## 2012-04-09 MED ORDER — DIGOXIN 125 MCG PO TABS
0.1250 mg | ORAL_TABLET | Freq: Every day | ORAL | Status: DC
  Administered 2012-04-10: 0.125 mg via ORAL
  Filled 2012-04-09 (×2): qty 1

## 2012-04-09 MED ORDER — DIGOXIN 0.05 MG/ML PO SOLN
0.2500 mg | Freq: Every day | ORAL | Status: AC
  Administered 2012-04-09: 0.25 mg via ORAL
  Filled 2012-04-09: qty 5

## 2012-04-09 MED ORDER — WARFARIN VIDEO
Freq: Once | Status: AC
  Administered 2012-04-11: 14:00:00

## 2012-04-09 MED ORDER — POTASSIUM CHLORIDE 10 MEQ/50ML IV SOLN
10.0000 meq | INTRAVENOUS | Status: DC
  Administered 2012-04-10: 10 meq via INTRAVENOUS

## 2012-04-09 MED ORDER — DIPHENOXYLATE-ATROPINE 2.5-0.025 MG PO TABS
1.0000 | ORAL_TABLET | Freq: Four times a day (QID) | ORAL | Status: DC | PRN
  Administered 2012-04-12 – 2012-04-14 (×5): 1 via ORAL
  Filled 2012-04-09 (×5): qty 1

## 2012-04-09 MED ORDER — AMIODARONE HCL IN DEXTROSE 360-4.14 MG/200ML-% IV SOLN
30.0000 mg/h | INTRAVENOUS | Status: DC
  Administered 2012-04-09: 30 mg/h via INTRAVENOUS
  Filled 2012-04-09 (×2): qty 200

## 2012-04-09 MED ORDER — DILTIAZEM HCL 100 MG IV SOLR
5.0000 mg/h | INTRAVENOUS | Status: DC
  Administered 2012-04-09: 5 mg/h via INTRAVENOUS
  Administered 2012-04-10: 15 mg/h via INTRAVENOUS
  Filled 2012-04-09 (×2): qty 100

## 2012-04-09 MED ORDER — POTASSIUM CHLORIDE 10 MEQ/50ML IV SOLN
INTRAVENOUS | Status: AC
  Filled 2012-04-09: qty 50

## 2012-04-09 MED ORDER — DIGOXIN 125 MCG PO TABS
0.1250 mg | ORAL_TABLET | Freq: Every day | ORAL | Status: DC
  Filled 2012-04-09: qty 1

## 2012-04-09 MED ORDER — POTASSIUM CHLORIDE 10 MEQ/50ML IV SOLN
10.0000 meq | INTRAVENOUS | Status: AC
  Administered 2012-04-09 (×2): 10 meq via INTRAVENOUS
  Filled 2012-04-09: qty 100

## 2012-04-09 NOTE — Plan of Care (Signed)
Problem: Phase III Progression Outcomes Goal: Dysrhythmias controlled Outcome: Not Progressing Changed medications today    Goal: Ambulates with pain/dyspnea controlled Outcome: Progressing Improved ambulation today

## 2012-04-09 NOTE — Progress Notes (Addendum)
6 Days Post-Op Procedure(s) (LRB): CORONARY ARTERY BYPASS GRAFTING (CABG) (N/A) MITRAL VALVE REPAIR (MVR) (N/A) Subjective:                      [address]            [place] [address]          [phone]      Feels better today w/ less nausea,diarrhea resolved CXR show inc. Pl effusions, wt up 1 lb Remains in afib despite iv amio x 72 hrs  Objective: Vital signs in last 24 hours: Temp:  [97.8 F (36.6 C)-98.6 F (37 C)] 97.8 F (36.6 C) (04/14 0757) Pulse Rate:  [46-114] 61  (04/14 0800) Cardiac Rhythm:  [-] Atrial fibrillation (04/14 0800) Resp:  [16-27] 17  (04/14 0800) BP: (77-144)/(54-77) 108/66 mmHg (04/14 0800) SpO2:  [92 %-100 %] 100 % (04/14 0800) Weight:  [165 lb 2 oz (74.9 kg)] 165 lb 2 oz (74.9 kg) (04/14 0500)  Hemodynamic parameters for last 24 hours:   Afi, BP >100Intake/Output from previous day: 04/13 0701 - 04/14 0700 In: 1160.4 [P.O.:250; I.V.:556.4; IV Piggyback:354] Out: 765 [Urine:765] Intake/Output this shift: Total I/O In: 20.7 [I.V.:20.7] Out: -   EXAM Dec breath sound abd soft extrem warm  Lab Results:  Basename 04/09/12 0515 04/08/12 0400  WBC 12.2* 8.7  HGB 9.7* 9.4*  HCT 29.2* 27.8*  PLT 193 214   BMET:  Basename 04/09/12 0515 04/08/12 0400  NA 131* 134*  K 3.8 3.3*  CL 94* 97  CO2 26 26  GLUCOSE 132* 140*  BUN 30* 21  CREATININE 1.11* 0.78  CALCIUM 8.8 8.3*    PT/INR:  Basename 04/09/12 0515  LABPROT 14.6  INR 1.12   ABG    Component Value Date/Time   PHART 7.311* 04/04/2012 0439   HCO3 21.5 04/04/2012 0439   TCO2 22 04/04/2012 1717   ACIDBASEDEF 5.0* 04/04/2012 0439   O2SAT 96.0 04/04/2012 0439   CBG (last 3)   Basename 04/09/12 0740 04/09/12 0438 04/08/12 2319  GLUCAP 132* 124* 141*    Assessment/Plan: S/P Procedure(s) (LRB): CORONARY ARTERY BYPASS GRAFTING (CABG) (N/A) MITRAL VALVE REPAIR (MVR) (N/A) PLAN Iv lasix today Cont coumadin slowly Add digoxin to amio for refractory afib  LOS:  14 days    VAN TRIGT III,Moani Weipert 04/09/2012

## 2012-04-09 NOTE — Plan of Care (Signed)
Problem: Phase III Progression Outcomes Goal: Dysrhythmias controlled Outcome: Not Progressing Pt continues in afib despite Amiodarone bolus and gtt again today, Cardiology consulted Goal: O2 Sat remains > or equal to 90% on room air Outcome: Not Progressing Not able to wean Oxygen to RA yet

## 2012-04-09 NOTE — Progress Notes (Signed)
Patient examined and record reviewed.Hemodynamics stable,labs satisfactory.Patient had stable day.Continue current care. VAN TRIGT III,Henrick Mcgue 04/09/2012   No success w/ IV amio and persisting Afib Will stop amio due to severe nausea,diarrhea and strt IV cardizem slowly

## 2012-04-10 ENCOUNTER — Inpatient Hospital Stay (HOSPITAL_COMMUNITY): Payer: Medicare Other

## 2012-04-10 LAB — BASIC METABOLIC PANEL
BUN: 26 mg/dL — ABNORMAL HIGH (ref 6–23)
CO2: 28 mEq/L (ref 19–32)
Calcium: 8.5 mg/dL (ref 8.4–10.5)
Chloride: 92 mEq/L — ABNORMAL LOW (ref 96–112)
Creatinine, Ser: 0.79 mg/dL (ref 0.50–1.10)
GFR calc Af Amer: >90 mL/min (ref >90)
GFR calc non Af Amer: 78 mL/min — ABNORMAL LOW (ref >90)
Glucose, Bld: 136 mg/dL — ABNORMAL HIGH (ref 70–99)
Potassium: 3 mEq/L — ABNORMAL LOW (ref 3.5–5.1)
Sodium: 130 mEq/L — ABNORMAL LOW (ref 135–145)

## 2012-04-10 LAB — GLUCOSE, CAPILLARY
Glucose-Capillary: 141 mg/dL — ABNORMAL HIGH (ref 70–99)
Glucose-Capillary: 299 mg/dL — ABNORMAL HIGH (ref 70–99)
Glucose-Capillary: 77 mg/dL (ref 70–99)

## 2012-04-10 LAB — CBC
HCT: 28.2 % — ABNORMAL LOW (ref 36.0–46.0)
Hemoglobin: 9.4 g/dL — ABNORMAL LOW (ref 12.0–15.0)
MCH: 27 pg (ref 26.0–34.0)
MCHC: 33.3 g/dL (ref 30.0–36.0)
MCV: 81 fL (ref 78.0–100.0)
Platelets: 360 10*3/uL (ref 150–400)
RBC: 3.48 MIL/uL — ABNORMAL LOW (ref 3.87–5.11)
RDW: 16.6 % — ABNORMAL HIGH (ref 11.5–15.5)
WBC: 11.7 10*3/uL — ABNORMAL HIGH (ref 4.0–10.5)

## 2012-04-10 MED ORDER — SODIUM CHLORIDE 0.9 % IJ SOLN
10.0000 mL | Freq: Two times a day (BID) | INTRAMUSCULAR | Status: DC
  Administered 2012-04-10 – 2012-04-11 (×2): 10 mL

## 2012-04-10 MED ORDER — POTASSIUM CHLORIDE CRYS ER 20 MEQ PO TBCR
20.0000 meq | EXTENDED_RELEASE_TABLET | Freq: Two times a day (BID) | ORAL | Status: DC
  Administered 2012-04-11 (×2): 20 meq via ORAL
  Filled 2012-04-10 (×4): qty 1

## 2012-04-10 MED ORDER — POTASSIUM CHLORIDE 10 MEQ/50ML IV SOLN
10.0000 meq | INTRAVENOUS | Status: AC
  Administered 2012-04-10 (×3): 10 meq via INTRAVENOUS
  Filled 2012-04-10 (×3): qty 50

## 2012-04-10 MED ORDER — CARVEDILOL 6.25 MG PO TABS
6.2500 mg | ORAL_TABLET | Freq: Two times a day (BID) | ORAL | Status: DC
  Administered 2012-04-10 – 2012-04-17 (×14): 6.25 mg via ORAL
  Filled 2012-04-10 (×18): qty 1

## 2012-04-10 MED ORDER — POTASSIUM CHLORIDE 10 MEQ/50ML IV SOLN
INTRAVENOUS | Status: AC
  Administered 2012-04-10: 10 meq via INTRAVENOUS
  Filled 2012-04-10: qty 50

## 2012-04-10 MED ORDER — POTASSIUM CHLORIDE 10 MEQ/50ML IV SOLN
10.0000 meq | INTRAVENOUS | Status: DC
  Administered 2012-04-10 (×2): 10 meq via INTRAVENOUS
  Filled 2012-04-10: qty 50

## 2012-04-10 MED ORDER — ALTEPLASE 2 MG IJ SOLR
2.0000 mg | Freq: Once | INTRAMUSCULAR | Status: AC
  Administered 2012-04-10: 2 mg
  Filled 2012-04-10 (×2): qty 2

## 2012-04-10 MED ORDER — POTASSIUM CHLORIDE 10 MEQ/50ML IV SOLN
INTRAVENOUS | Status: AC
  Filled 2012-04-10: qty 50

## 2012-04-10 MED ORDER — FUROSEMIDE 40 MG PO TABS
40.0000 mg | ORAL_TABLET | Freq: Two times a day (BID) | ORAL | Status: DC
  Administered 2012-04-10 – 2012-04-17 (×14): 40 mg via ORAL
  Filled 2012-04-10 (×17): qty 1

## 2012-04-10 MED ORDER — SODIUM CHLORIDE 0.9 % IJ SOLN
3.0000 mL | Freq: Two times a day (BID) | INTRAMUSCULAR | Status: DC
  Administered 2012-04-17: 3 mL via INTRAVENOUS

## 2012-04-10 MED ORDER — SODIUM CHLORIDE 0.9 % IV SOLN: 250.0000 mL | INTRAVENOUS | Status: DC | PRN

## 2012-04-10 MED ORDER — ACETAMINOPHEN 325 MG PO TABS
650.0000 mg | ORAL_TABLET | Freq: Four times a day (QID) | ORAL | Status: DC | PRN
  Administered 2012-04-11: 650 mg via ORAL
  Filled 2012-04-10: qty 2

## 2012-04-10 MED ORDER — SODIUM CHLORIDE 0.9 % IJ SOLN
10.0000 mL | INTRAMUSCULAR | Status: DC | PRN
  Administered 2012-04-11: 20 mL
  Administered 2012-04-12 – 2012-04-13 (×2): 10 mL
  Administered 2012-04-13 – 2012-04-15 (×4): 20 mL
  Administered 2012-04-16 (×2): 10 mL

## 2012-04-10 MED ORDER — ASPIRIN EC 81 MG PO TBEC
81.0000 mg | DELAYED_RELEASE_TABLET | Freq: Every day | ORAL | Status: DC
  Administered 2012-04-10 – 2012-04-17 (×8): 81 mg via ORAL
  Filled 2012-04-10 (×8): qty 1

## 2012-04-10 MED ORDER — SODIUM CHLORIDE 0.9 % IJ SOLN: 3.0000 mL | INTRAMUSCULAR | Status: DC | PRN

## 2012-04-10 MED ORDER — ALTEPLASE 2 MG IJ SOLR
2.0000 mg | Freq: Once | INTRAMUSCULAR | Status: AC
  Administered 2012-04-10: 2 mg
  Filled 2012-04-10: qty 2

## 2012-04-10 MED ORDER — MOVING RIGHT ALONG BOOK
Freq: Once | Status: AC
  Administered 2012-04-10: 10:00:00
  Filled 2012-04-10: qty 1

## 2012-04-10 MED ORDER — DILTIAZEM HCL ER 60 MG PO CP12
120.0000 mg | ORAL_CAPSULE | Freq: Two times a day (BID) | ORAL | Status: DC
  Administered 2012-04-10 – 2012-04-17 (×14): 120 mg via ORAL
  Filled 2012-04-10 (×21): qty 2

## 2012-04-10 NOTE — Progress Notes (Addendum)
   CARDIOTHORACIC SURGERY PROGRESS NOTE   R7 Days Post-Op Procedure(s) (LRB): CORONARY ARTERY BYPASS GRAFTING (CABG) (N/A) MITRAL VALVE REPAIR (MVR) (N/A)  Subjective: Reports feeling much better.  Diarrhea improved.  Ambulating fairly well.  Breathing comfortably on nasal cannula  Objective: Vital signs: BP Readings from Last 1 Encounters:  04/10/12 118/69   Pulse Readings from Last 1 Encounters:  04/10/12 94   Resp Readings from Last 1 Encounters:  04/10/12 22   Temp Readings from Last 1 Encounters:  04/10/12 97.7 F (36.5 C) Oral    Hemodynamics:    Physical Exam:  Rhythm:   Afib with controlled rate on Cardizem drip  Breath sounds: Fairly clear  Heart sounds:  irreg  Incisions:  Healing nicely  Abdomen:  soft  Extremities:  warm   Intake/Output from previous day: 04/14 0701 - 04/15 0700 In: 1452 [P.O.:870; I.V.:478; IV Piggyback:104] Out: 1150 [Urine:1150] Intake/Output this shift:    Lab Results:  Basename 04/10/12 0530 04/09/12 0515  WBC 11.7* 12.2*  HGB 9.4* 9.7*  HCT 28.2* 29.2*  PLT 360 193   BMET:  Basename 04/10/12 0530 04/09/12 0515  NA 130* 131*  K 3.0* 3.8  CL 92* 94*  CO2 28 26  GLUCOSE 136* 132*  BUN 26* 30*  CREATININE 0.79 1.11*  CALCIUM 8.5 8.8    CBG (last 3)   Basename 04/10/12 0727 04/10/12 0355 04/09/12 2323  GLUCAP 134* 152* 91   ABG    Component Value Date/Time   PHART 7.311* 04/04/2012 0439   HCO3 21.5 04/04/2012 0439   TCO2 22 04/04/2012 1717   ACIDBASEDEF 5.0* 04/04/2012 0439   O2SAT 96.0 04/04/2012 0439   PT/INR: Results for AANIKA, DEFOOR (MRN [id number]) as of 04/10/2012 08:58  Ref. Range 04/10/2012 05:30  Prothrombin Time Latest Range: 11.6-15.2 seconds 16.3 (H)  INR Latest Range: 0.00-1.49  1.29    CXR: RADIOLOGY REPORT*  Clinical Data: Post heart surgery  PORTABLE CHEST - 1 VIEW 04/10/2012 Comparison: Portable exam 0643 hours compared to 04/09/2012  Findings:  Right arm PICC line, tip projecting over SVC  near cavoatrial  junction.  Enlargement of cardiac silhouette post CABG and MVR.  Pulmonary vascular congestion.  Slightly improved pulmonary edema, most persistent in the left  upper lobe.  Persistent left basilar atelectasis and small effusion.  No pneumothorax.  Bones diffusely demineralized.  IMPRESSION:  Slightly improved pulmonary edema.  Original Report Authenticated By: Lollie Marrow, M.D.   Assessment/Plan: S/P Procedure(s) (LRB): CORONARY ARTERY BYPASS GRAFTING (CABG) (N/A) MITRAL VALVE REPAIR (MVR) (N/A)  Clinically looks good now POD #7  Post op Afib, now with stable HR on Cardizem drip Diarrhea and abdominal pain resolved since Amiodarone stopped Expected post op acute blood loss anemia, mild, stable Expected post op volume excess, mild, diuresing Hypokalemia   Defer management of Afib to Cardiology - I favor rate control and coumadin for now  Replace KCl  Mobilize  Diuresis  Coumadin  Zorina Mallin H 04/10/2012 8:57 AM  Discussed with Dr [place] Will start oral Cardizem and Carvedilol and wean Cardizem drip as tolerated  Sabirin Baray H 04/10/2012 9:10 AM

## 2012-04-10 NOTE — Progress Notes (Signed)
Physical Therapy Treatment Patient Details Name: [name] MRN: [id number] DOB: 1932-10-27 Today's Date: 04/10/2012  PT Assessment/Plan  PT - Assessment/Plan Comments on Treatment Session: Pt s/p CABG with several periods of diarrhea today and with return to chair also incontinent of stool and assisted to chair and to doff undergarments and perform pericare. Pt very pleasant with excellent progress with mobility. Pt unable to recall sternal precautions and educated for all 3 precautions.  PT Plan: Discharge plan remains appropriate;Frequency remains appropriate PT Goals  Acute Rehab PT Goals PT Goal: Sit to Stand - Progress: Progressing toward goal PT Goal: Stand to Sit - Progress: Progressing toward goal PT Transfer Goal: Bed to Chair/Chair to Bed - Progress: Progressing toward goal PT Goal: Ambulate - Progress: Progressing toward goal PT Goal: Up/Down Stairs - Progress: Progressing toward goal PT Goal: Perform Home Exercise Program - Progress: Progressing toward goal Additional Goals Additional Goal #1: Pt will independently state and demonstrate adherence to 3/3 sternal precautions PT Goal: Additional Goal #1 - Progress: Goal set today  PT Treatment Precautions/Restrictions  Precautions Precautions: Sternal;Fall Restrictions Weight Bearing Restrictions: No Mobility (including Balance) Bed Mobility Bed Mobility: No Transfers Sit to Stand: 4: Min assist;From chair/3-in-1 Sit to Stand Details (indicate cue type and reason): 2 transfer from chair and one from [facility] max cueing for hand placement and assist for anterior translation Stand to Sit: 4: Min assist;To chair/3-in-1 Stand to Sit Details: cueing to control descent and to step back fully to surface Stand Pivot Transfers: 4: Min assist Stand Pivot Transfer Details (indicate cue type and reason): without use of RW transfer bed to and from [facility] with hand held assist Ambulation/Gait Ambulation/Gait: Yes Ambulation/Gait  Assistance: 4: Min assist Ambulation/Gait Assistance Details (indicate cue type and reason): minguard with cueing to step into RW, pt fatigued end of session Ambulation Distance (Feet): 200 Feet Assistive device: Rolling walker Gait Pattern: Decreased stride length;Step-through pattern Stairs: No  Posture/Postural Control Posture/Postural Control: No significant limitations Exercise  General Exercises - Lower Extremity Long Arc Quad: AROM;15 reps;Seated;Both Hip Flexion/Marching: AROM;Both;Other reps (comment);Seated (15reps) End of Session PT - End of Session Equipment Utilized During Treatment: Gait belt Activity Tolerance: Patient tolerated treatment well Patient left: in chair Nurse Communication: Mobility status for transfers;Mobility status for ambulation General Behavior During Session: [facility] for tasks performed Cognition: [facility] for tasks performed  Delorse Lek 04/10/2012, 4:04 PM Delaney Meigs, PT [phone]

## 2012-04-10 NOTE — Progress Notes (Signed)
[facility] BRIEF SICU PROGRESS NOTE  7 Days Post-Op  S/P Procedure(s) (LRB): CORONARY ARTERY BYPASS GRAFTING (CABG) (N/A) MITRAL VALVE REPAIR (MVR) (N/A)   Stable day Still in Afib but HR well-controlled off Diltiazem drip  Plan: Continue current plan.  Transfer to step down in am if stable.  Purcell Nails 04/10/2012 10:33 PM

## 2012-04-10 NOTE — Progress Notes (Signed)
TELEMETRY: Reviewed telemetry pt in afib with rate 85-95 at rest, 120s with ambulation: Filed Vitals:   04/10/12 0500 04/10/12 0600 04/10/12 0700 04/10/12 0729  BP: 112/46 118/69    Pulse: 77 72 94   Temp:    97.7 F (36.5 C)  TempSrc:    Oral  Resp: 17 17 22    Height:      Weight: 74.9 kg (165 lb 2 oz)     SpO2: 94% 94% 94%     Intake/Output Summary (Last 24 hours) at 04/10/12 0852 Last data filed at 04/10/12 0700  Gross per 24 hour  Intake 1431.32 ml  Output   1050 ml  Net 381.32 ml    SUBJECTIVE Feels much better off amiodarone. Nausea and diarrhea resolved. Ambulating in halls. Afib rate fairly well controlled.  LABS: Basic Metabolic Panel:  Basename 04/10/12 0530 04/09/12 0515  NA 130* 131*  K 3.0* 3.8  CL 92* 94*  CO2 28 26  GLUCOSE 136* 132*  BUN 26* 30*  CREATININE 0.79 1.11*  CALCIUM 8.5 8.8  MG -- --  PHOS -- --   Liver Function Tests:  [facility] 04/09/12 0515  AST 30  ALT 28  ALKPHOS 78  BILITOT 0.7  PROT 6.1  ALBUMIN 3.1*   No results found for this basename: LIPASE:2,AMYLASE:2 in the last 72 hours CBC:  Basename 04/10/12 0530 04/09/12 0515  WBC 11.7* 12.2*  NEUTROABS -- --  HGB 9.4* 9.7*  HCT 28.2* 29.2*  MCV 81.0 80.0  PLT 360 193   Cardiac Enzymes: No results found for this basename: CKTOTAL:3,CKMB:3,CKMBINDEX:3,TROPONINI:3 in the last 72 hours BNP: No components found with this basename: POCBNP:3 D-Dimer: No results found for this basename: DDIMER:2 in the last 72 hours Hemoglobin A1C: No results found for this basename: HGBA1C in the last 72 hours Fasting Lipid Panel: No results found for this basename: CHOL,HDL,LDLCALC,TRIG,CHOLHDL,LDLDIRECT in the last 72 hours Thyroid Function Tests: No results found for this basename: TSH,T4TOTAL,FREET3,T3FREE,THYROIDAB in the last 72 hours Anemia Panel: No results found for this basename: VITAMINB12,FOLATE,FERRITIN,TIBC,IRON,RETICCTPCT in the last 72 hours  Radiology/Studies:    04/10/2012  *RADIOLOGY REPORT*  Clinical Data: Post heart surgery  PORTABLE CHEST - 1 VIEW  Comparison: Portable exam 0643 hours compared to 04/09/2012  Findings: Right arm PICC line, tip projecting over SVC near cavoatrial junction. Enlargement of cardiac silhouette post CABG and MVR. Pulmonary vascular congestion. Slightly improved pulmonary edema, most persistent in the left upper lobe. Persistent left basilar atelectasis and small effusion. No pneumothorax. Bones diffusely demineralized.  IMPRESSION: Slightly improved pulmonary edema.  Original Report Authenticated By: Lollie Marrow, M.D.    PHYSICAL EXAM General: Well developed, well nourished, in no acute distress. Head: Normal Neck: Negative for carotid bruits. JVD not elevated. Lungs: Mild crackles right base. Heart: IRRR S1 S2 without murmurs, rubs, or gallops.  Abdomen: Soft, non-tender, non-distended with normoactive bowel sounds. No hepatomegaly. No rebound/guarding. No obvious abdominal masses. Msk:  Strength and tone appears normal for age. Extremities: No clubbing, cyanosis or edema.  Distal pedal pulses are 2+ and equal bilaterally. Neuro: Alert and oriented X 3. Moves all extremities spontaneously. Psych:  Responds to questions appropriately with a normal affect.  ASSESSMENT AND PLAN: 1. Atrial fibrillation post op CABG/MV repair. Persistent. Rate controlled on IV cardizem. Intolerant of amiodarone due to N/V and diarrhea. 2. S/p CABG/MV repair on 04/03/12 3. CHF acute systolic EF 35%. 4. NSTEMI 5. DM 6. HTN  Plan: transition cardizem to po. Options for  antiarrhythmic therapy are limited. Unable to tolerate amiodarone. With low EF other option would be Tikosyn but QTc has been variable and potassium is too low. I would favor rate control for now with consideration of DCCV at a later date once she is healed from CABG. Needs anticoagulation with coumadin given [place] score of 3. Replete K+   Active Problems:  DM   HYPERTENSION  Coronary artery disease  Mitral regurgitation  Atrial fibrillation    Signed, Jair Lindblad [place] MD,FACC 04/10/2012 9:04 AM

## 2012-04-10 NOTE — Progress Notes (Signed)
Inpatient Diabetes Program Recommendations  AACE/ADA: New Consensus Statement on Inpatient Glycemic Control  Target Ranges:  Prepandial:   less than 140 mg/dL      Peak postprandial:   less than 180 mg/dL (1-2 hours)      Critically ill patients:  140 - 180 mg/dL  Pager:  [phone] Hours:  8 am-10pm   Reason for Visit: Elevated prandial glucose:  134, 299 mg/dL  Inpatient Diabetes Program Recommendations Insulin - Meal Coverage: Add Novolog 4 units TID for meal coverage  Note: Consider changing CBGs to TID and Hs

## 2012-04-11 ENCOUNTER — Inpatient Hospital Stay (HOSPITAL_COMMUNITY): Payer: Medicare Other

## 2012-04-11 LAB — CBC
HCT: 27.2 % — ABNORMAL LOW (ref 36.0–46.0)
MCH: 27.2 pg (ref 26.0–34.0)
MCHC: 32.4 g/dL (ref 30.0–36.0)
MCV: 84 fL (ref 78.0–100.0)
RDW: 17 % — ABNORMAL HIGH (ref 11.5–15.5)
WBC: 10.9 10*3/uL — ABNORMAL HIGH (ref 4.0–10.5)

## 2012-04-11 LAB — GLUCOSE, CAPILLARY: Glucose-Capillary: 122 mg/dL — ABNORMAL HIGH (ref 70–99)

## 2012-04-11 LAB — BASIC METABOLIC PANEL
BUN: 24 mg/dL — ABNORMAL HIGH (ref 6–23)
Calcium: 8.3 mg/dL — ABNORMAL LOW (ref 8.4–10.5)
Chloride: 96 mEq/L (ref 96–112)
Creatinine, Ser: 0.7 mg/dL (ref 0.50–1.10)
GFR calc Af Amer: >90 mL/min (ref >90)

## 2012-04-11 MED ORDER — ALBUTEROL SULFATE (5 MG/ML) 0.5% IN NEBU
2.5000 mg | INHALATION_SOLUTION | Freq: Four times a day (QID) | RESPIRATORY_TRACT | Status: DC | PRN
  Administered 2012-04-11: 2.5 mg via RESPIRATORY_TRACT
  Filled 2012-04-11: qty 0.5

## 2012-04-11 MED ORDER — POTASSIUM CHLORIDE 10 MEQ/50ML IV SOLN
10.0000 meq | INTRAVENOUS | Status: AC
  Administered 2012-04-11 (×3): 10 meq via INTRAVENOUS

## 2012-04-11 MED ORDER — POTASSIUM CHLORIDE 10 MEQ/50ML IV SOLN
10.0000 meq | INTRAVENOUS | Status: AC
  Administered 2012-04-11 (×3): 10 meq via INTRAVENOUS
  Filled 2012-04-11: qty 150
  Filled 2012-04-11: qty 50
  Filled 2012-04-11: qty 100

## 2012-04-11 NOTE — Progress Notes (Signed)
   CARDIOTHORACIC SURGERY SICU PROGRESS NOTE  8 Days Post-Op  S/P Procedure(s) (LRB): CORONARY ARTERY BYPASS GRAFTING (CABG) (N/A) MITRAL VALVE REPAIR (MVR) (N/A)  Subjective: Feels better.  No complaints  Objective: Vital signs in last 24 hours: Temp:  [97.7 F (36.5 C)-98.5 F (36.9 C)] 97.7 F (36.5 C) (04/16 0748) Pulse Rate:  [49-83] 70  (04/16 0700) Cardiac Rhythm:  [-] Atrial fibrillation (04/16 0800) Resp:  [15-26] 16  (04/16 0700) BP: (95-135)/(38-72) 100/67 mmHg (04/16 0700) SpO2:  [90 %-96 %] 96 % (04/16 0700) FiO2 (%):  [0 %] 0 % (04/15 0905) Weight:  [75.9 kg (167 lb 5.3 oz)] 75.9 kg (167 lb 5.3 oz) (04/16 0500)  Physical Exam:  Rhythm:   Afib with controlled rate  Breath sounds: clear  Heart sounds:  irreg  Incisions:  Clean and dry  Abdomen:  soft  Extremities:  warm   Intake/Output from previous day: 04/15 0701 - 04/16 0700 In: 1327.5 [P.O.:730; I.V.:247.5; IV Piggyback:350] Out: 825 [Urine:820; Stool:5] Intake/Output this shift: Total I/O In: 100 [P.O.:50; IV Piggyback:50] Out: 75 [Urine:75]  Lab Results:  Basename 04/11/12 0430 04/10/12 0530  WBC 10.9* 11.7*  HGB 8.8* 9.4*  HCT 27.2* 28.2*  PLT 371 360   BMET:  Basename 04/11/12 0430 04/10/12 0530  NA 134* 130*  K 3.3* 3.0*  CL 96 92*  CO2 28 28  GLUCOSE 112* 136*  BUN 24* 26*  CREATININE 0.70 0.79  CALCIUM 8.3* 8.5    CBG (last 3)   Basename 04/11/12 0747 04/11/12 0441 04/11/12 0008  GLUCAP 132* 111* 122*   PT/INR:   Basename 04/11/12 0430  LABPROT 19.2*  INR 1.58*    CXR:  *RADIOLOGY REPORT*  Clinical Data: Evaluate CHF, shortness of breath  CHEST - 2 VIEW  Comparison: 04/10/2012; 04/09/2012; 04/08/2012  Findings: Unchanged borderline enlarged cardiac silhouette and  mediastinal contours and median sternotomy, CABG and valve  replacement/repair. Stable position of support apparatus. Grossly  unchanged small bilateral effusions and bibasilar opacities.  Suspect  hyperinflated lungs with persistent upper lung and  peripheral predominant heterogeneous opacities. Cephalization of  flow. Vascular calcifications. Grossly unchanged bones including  accentuated thoracic kyphosis. Post cholecystectomy.  IMPRESSION:  1. Grossly unchanged findings of small bilateral effusions and  bibasilar opacities favored to represent atelectasis.  2. Suspect hyperinflated lungs with superimposed pulmonary edema,  though note, atypical infection may have a similar appearance.  Original Report Authenticated By: [name], M.D.   Assessment/Plan: S/P Procedure(s) (LRB): CORONARY ARTERY BYPASS GRAFTING (CABG) (N/A) MITRAL VALVE REPAIR (MVR) (N/A)  Progressing well Stable rate-controlled Afib Expected post op acute blood loss anemia, mild, stable Expected post op volume excess, mild, diuresing Type II diabetes mellitus, glycemic control excellent Diarrhea improved INR starting to rise on coumadin   Mobilize  Supplement K+  Transfer step down  Continue present plan   [name] H 04/11/2012 8:42 AM

## 2012-04-11 NOTE — Progress Notes (Signed)
Patient ambulated 150 feet x 1 assist with walker on 3l 02 Gantt pt assisted to bathroom and back to rbed. Tolerated well will monitor patient. Gable Odonohue, Randall An RN

## 2012-04-11 NOTE — Plan of Care (Signed)
Problem: Phase III Progression Outcomes Goal: Time patient transferred to PCTU/Telemetry POD Outcome: Completed/Met Date Met:  04/11/12 Transferred 1548

## 2012-04-11 NOTE — Progress Notes (Signed)
TELEMETRY: Reviewed telemetry pt in afib with rate 60-70 at rest Filed Vitals:   04/11/12 0200 04/11/12 0300 04/11/12 0400 04/11/12 0500  BP: 103/49 131/72 111/58 114/44  Pulse: 67 71 78 72  Temp:   98.5 F (36.9 C)   TempSrc:   Oral   Resp: 15 21 18 16   Height:      Weight:      SpO2: 93% 91% 92% 92%    Intake/Output Summary (Last 24 hours) at 04/11/12 0647 Last data filed at 04/11/12 0500  Gross per 24 hour  Intake 1297.5 ml  Output    825 ml  Net  472.5 ml    SUBJECTIVE Feels much better today. Ambulating in halls. Complains of back pain.  LABS: Basic Metabolic Panel:  Basename 04/11/12 0430 04/10/12 0530  NA 134* 130*  K 3.3* 3.0*  CL 96 92*  CO2 28 28  GLUCOSE 112* 136*  BUN 24* 26*  CREATININE 0.70 0.79  CALCIUM 8.3* 8.5  MG -- --  PHOS -- --   Liver Function Tests:  [facility] 04/09/12 0515  AST 30  ALT 28  ALKPHOS 78  BILITOT 0.7  PROT 6.1  ALBUMIN 3.1*   No results found for this basename: LIPASE:2,AMYLASE:2 in the last 72 hours CBC:  Basename 04/11/12 0430 04/10/12 0530  WBC 10.9* 11.7*  NEUTROABS -- --  HGB 8.8* 9.4*  HCT 27.2* 28.2*  MCV 84.0 81.0  PLT 371 360   Cardiac Enzymes: No results found for this basename: CKTOTAL:3,CKMB:3,CKMBINDEX:3,TROPONINI:3 in the last 72 hours BNP: No components found with this basename: POCBNP:3 D-Dimer: No results found for this basename: DDIMER:2 in the last 72 hours Hemoglobin A1C: No results found for this basename: HGBA1C in the last 72 hours Fasting Lipid Panel: No results found for this basename: CHOL,HDL,LDLCALC,TRIG,CHOLHDL,LDLDIRECT in the last 72 hours Thyroid Function Tests: No results found for this basename: TSH,T4TOTAL,FREET3,T3FREE,THYROIDAB in the last 72 hours Anemia Panel: No results found for this basename: VITAMINB12,FOLATE,FERRITIN,TIBC,IRON,RETICCTPCT in the last 72 hours  Radiology/Studies:  04/10/2012  *RADIOLOGY REPORT*  Clinical Data: Post heart surgery  PORTABLE CHEST  - 1 VIEW  Comparison: Portable exam 0643 hours compared to 04/09/2012  Findings: Right arm PICC line, tip projecting over SVC near cavoatrial junction. Enlargement of cardiac silhouette post CABG and MVR. Pulmonary vascular congestion. Slightly improved pulmonary edema, most persistent in the left upper lobe. Persistent left basilar atelectasis and small effusion. No pneumothorax. Bones diffusely demineralized.  IMPRESSION: Slightly improved pulmonary edema.  Original Report Authenticated By: Lollie Marrow, M.D.    PHYSICAL EXAM General: Well developed, well nourished, in no acute distress. Head: Normal Neck: Negative for carotid bruits. JVD not elevated. Lungs: Mild crackles right base. Heart: IRRR S1 S2 without murmurs, rubs, or gallops.  Abdomen: Soft, non-tender, non-distended with normoactive bowel sounds. No hepatomegaly. No rebound/guarding. No obvious abdominal masses. Msk:  Strength and tone appears normal for age. Extremities: No clubbing, cyanosis or edema.  Distal pedal pulses are 2+ and equal bilaterally. Neuro: Alert and oriented X 3. Moves all extremities spontaneously. Psych:  Responds to questions appropriately with a normal affect.  ASSESSMENT AND PLAN: 1. Atrial fibrillation post op CABG/MV repair. Persistent. Rate controlled on oral meds. Intolerant of amiodarone due to N/V and diarrhea. 2. S/p CABG/MV repair on 04/03/12 3. CHF acute systolic EF 35%. 4. NSTEMI 5. DM 6. HTN 7. Anticoagulation INR 1.58  Plan: Rate very well controlled, will stop digoxin and continue carvedilol and cardizem po. Options for  antiarrhythmic therapy are limited. Unable to tolerate amiodarone. With low EF other option would be Tikosyn but QTc has been variable and potassium is too low. I would favor rate control for now with consideration of DCCV at a later date once she is healed from CABG. Needs anticoagulation with coumadin given [place] score of 3. Replete K+. Will check Ecg today.   Active  Problems:  DM  HYPERTENSION  Coronary artery disease  Mitral regurgitation  Atrial fibrillation    Signed, Welles Walthall [place] MD,FACC 04/11/2012 6:47 AM

## 2012-04-12 DIAGNOSIS — I5042 Chronic combined systolic (congestive) and diastolic (congestive) heart failure: Secondary | ICD-10-CM | POA: Diagnosis present

## 2012-04-12 DIAGNOSIS — I5023 Acute on chronic systolic (congestive) heart failure: Secondary | ICD-10-CM

## 2012-04-12 LAB — GLUCOSE, CAPILLARY
Glucose-Capillary: 101 mg/dL — ABNORMAL HIGH (ref 70–99)
Glucose-Capillary: 139 mg/dL — ABNORMAL HIGH (ref 70–99)
Glucose-Capillary: 219 mg/dL — ABNORMAL HIGH (ref 70–99)
Glucose-Capillary: 77 mg/dL (ref 70–99)

## 2012-04-12 LAB — BASIC METABOLIC PANEL
BUN: 21 mg/dL (ref 6–23)
CO2: 27 mEq/L (ref 19–32)
Calcium: 8.6 mg/dL (ref 8.4–10.5)
Glucose, Bld: 129 mg/dL — ABNORMAL HIGH (ref 70–99)
Sodium: 134 mEq/L — ABNORMAL LOW (ref 135–145)

## 2012-04-12 LAB — CBC
HCT: 28.4 % — ABNORMAL LOW (ref 36.0–46.0)
Hemoglobin: 9.1 g/dL — ABNORMAL LOW (ref 12.0–15.0)
MCH: 26.5 pg (ref 26.0–34.0)
MCV: 82.6 fL (ref 78.0–100.0)
RBC: 3.44 MIL/uL — ABNORMAL LOW (ref 3.87–5.11)

## 2012-04-12 LAB — PROTIME-INR: Prothrombin Time: 22.1 seconds — ABNORMAL HIGH (ref 11.6–15.2)

## 2012-04-12 MED ORDER — FUROSEMIDE 10 MG/ML IJ SOLN
40.0000 mg | Freq: Once | INTRAMUSCULAR | Status: AC
  Administered 2012-04-12: 40 mg via INTRAVENOUS
  Filled 2012-04-12: qty 4

## 2012-04-12 MED ORDER — POTASSIUM CHLORIDE CRYS ER 20 MEQ PO TBCR
40.0000 meq | EXTENDED_RELEASE_TABLET | Freq: Two times a day (BID) | ORAL | Status: DC
  Administered 2012-04-12 – 2012-04-17 (×11): 40 meq via ORAL
  Filled 2012-04-12 (×12): qty 2

## 2012-04-12 NOTE — Progress Notes (Signed)
TELEMETRY: Reviewed telemetry pt in afib with controlled rate Filed Vitals:   04/11/12 1745 04/11/12 2017 04/11/12 2140 04/12/12 0433  BP: 134/58 112/53  130/72  Pulse: 79 78  77  Temp:  98.4 F (36.9 C)  97.6 F (36.4 C)  TempSrc:  Oral  Oral  Resp:  18  19  Height:      Weight:    79 kg (174 lb 2.6 oz)  SpO2:  95% 96% 98%    Intake/Output Summary (Last 24 hours) at 04/12/12 0659 Last data filed at 04/11/12 1945  Gross per 24 hour  Intake    610 ml  Output    876 ml  Net   -266 ml    SUBJECTIVE Feels  better today. Back pain improved. Noted increased wheezing and SOB last night improved with nebulizer.Ambulating in halls.   LABS: Basic Metabolic Panel:  Basename 04/11/12 0430 04/10/12 0530  NA 134* 130*  K 3.3* 3.0*  CL 96 92*  CO2 28 28  GLUCOSE 112* 136*  BUN 24* 26*  CREATININE 0.70 0.79  CALCIUM 8.3* 8.5  MG -- --  PHOS -- --   Liver Function Tests: No results found for this basename: AST:2,ALT:2,ALKPHOS:2,BILITOT:2,PROT:2,ALBUMIN:2 in the last 72 hours No results found for this basename: LIPASE:2,AMYLASE:2 in the last 72 hours CBC:  Basename 04/12/12 0509 04/11/12 0430  WBC 12.2* 10.9*  NEUTROABS -- --  HGB 9.1* 8.8*  HCT 28.4* 27.2*  MCV 82.6 84.0  PLT 413* 371   Cardiac Enzymes: No results found for this basename: CKTOTAL:3,CKMB:3,CKMBINDEX:3,TROPONINI:3 in the last 72 hours BNP: No components found with this basename: POCBNP:3 D-Dimer: No results found for this basename: DDIMER:2 in the last 72 hours Hemoglobin A1C: No results found for this basename: HGBA1C in the last 72 hours Fasting Lipid Panel: No results found for this basename: CHOL,HDL,LDLCALC,TRIG,CHOLHDL,LDLDIRECT in the last 72 hours Thyroid Function Tests: No results found for this basename: TSH,T4TOTAL,FREET3,T3FREE,THYROIDAB in the last 72 hours Anemia Panel: No results found for this basename: VITAMINB12,FOLATE,FERRITIN,TIBC,IRON,RETICCTPCT in the last 72  hours  Radiology/Studies:  04/10/2012  *RADIOLOGY REPORT*  Clinical Data: Post heart surgery  PORTABLE CHEST - 1 VIEW  Comparison: Portable exam 0643 hours compared to 04/09/2012  Findings: Right arm PICC line, tip projecting over SVC near cavoatrial junction. Enlargement of cardiac silhouette post CABG and MVR. Pulmonary vascular congestion. Slightly improved pulmonary edema, most persistent in the left upper lobe. Persistent left basilar atelectasis and small effusion. No pneumothorax. Bones diffusely demineralized.  IMPRESSION: Slightly improved pulmonary edema.  Original Report Authenticated By: Lollie Marrow, M.D.    PHYSICAL EXAM General: Well developed, well nourished, in no acute distress. Head: Normal Neck: Negative for carotid bruits. JVD not elevated. Lungs: Mild crackles right base. Diffuse expiratory wheezing. Heart: IRRR S1 S2 without murmurs, rubs, or gallops.  Abdomen: Soft, non-tender, non-distended with normoactive bowel sounds. No hepatomegaly. No rebound/guarding. No obvious abdominal masses. Msk:  Strength and tone appears normal for age. Extremities: No clubbing, cyanosis or edema.  Distal pedal pulses are 2+ and equal bilaterally. Neuro: Alert and oriented X 3. Moves all extremities spontaneously. Psych:  Responds to questions appropriately with a normal affect.  ASSESSMENT AND PLAN: 1. Atrial fibrillation post op CABG/MV repair. Persistent. Rate controlled on oral meds. Intolerant of amiodarone due to N/V and diarrhea. 2. S/p CABG/MV repair on 04/03/12 3. CHF acute systolic EF 35%. wieght up significantly. Audible wheezing. 4. NSTEMI 5. DM 6. HTN 7. Anticoagulation INR 1.9  Plan:  Continue oral cardizem and carvedilol. Give IV lasix now. Nebulizer Rx. Replete K+.    Active Problems:  DM  HYPERTENSION  Coronary artery disease  Mitral regurgitation  Atrial fibrillation    Signed, Quanisha Drewry [place] MD,FACC 04/12/2012 6:59 AM

## 2012-04-12 NOTE — Progress Notes (Signed)
Patient's Oxygen weaned from 3L to 2L. Will continue to monitor. 

## 2012-04-12 NOTE — Progress Notes (Signed)
Discussed with Dr. [place] that pt has been having frequent burst of vtach/wide complex beats, K+ 3.5 this am, BP 140/61, pt asymptomatic.  New orders received for PO KCL.  Will continue to monitor.

## 2012-04-12 NOTE — Progress Notes (Signed)
Patient had some inspiratory wheezing along with audible wheezes and was demonstrating mild destress with the use of accessory muscles.  Patient's vitals signs were stable; BP 112/53 HR 75 Temp 98.4 RR 18 O2 sat 95 on 3L. MD notified and orders received for a prn breathing treatment.  Patient received a albuterol nebulizer 2.5mg  treatment.  Assessed patient afterwards. Lungs sound much clearer  She stated she felt better, was relaxed and was visibly no longer in distress. Will continue to monitor.

## 2012-04-12 NOTE — Progress Notes (Signed)
Inpatient Diabetes Program Recommendations  AACE/ADA: New Consensus Statement on Inpatient Glycemic Control  Target Ranges:  Prepandial:   less than 140 mg/dL      Peak postprandial:   less than 180 mg/dL (1-2 hours)      Critically ill patients:  140 - 180 mg/dL  Pager:  [phone] Hours:  8 am-10pm   Reason for Visit: Elevated prandial glucose:  Results for KYNDRA, CONDRON (MRN [id number]) as of 04/12/2012 15:26  Ref. Range 04/11/2012 07:47 04/11/2012 11:59 04/11/2012 16:32 04/11/2012 20:21  Glucose-Capillary Latest Range: 70-99 mg/dL 295 (H) 284 (H) 132 (H) 170 (H)    Inpatient Diabetes Program Recommendations Correction (SSI): Change to TID and HS Insulin - Meal Coverage: Add Novolog 4 units TID for meal coverage

## 2012-04-12 NOTE — Progress Notes (Addendum)
[address]            [place] [address]          [phone]     9 Days Post-Op  Procedure(s) (LRB): CORONARY ARTERY BYPASS GRAFTING (CABG) (N/A) MITRAL VALVE REPAIR (MVR) (N/A) Subjective: Feeling better overall, some loose stools this am  Objective  Telemetry Afib w/CVR, PVC's , short runs vtach  Temp:  [97.5 F (36.4 C)-98.4 F (36.9 C)] 97.6 F (36.4 C) (04/17 0433) Pulse Rate:  [53-85] 85  (04/17 0812) Resp:  [17-22] 19  (04/17 0433) BP: (85-140)/(48-72) 140/61 mmHg (04/17 0812) SpO2:  [95 %-100 %] 98 % (04/17 0433) Weight:  [174 lb 2.6 oz (79 kg)] 174 lb 2.6 oz (79 kg) (04/17 0433)   Intake/Output Summary (Last 24 hours) at 04/12/12 0853 Last data filed at 04/11/12 1945  Gross per 24 hour  Intake    460 ml  Output    801 ml  Net   -341 ml       General appearance: alert, cooperative and no distress Heart: irregularly irregular rhythm, S1, S2 normal and no murmur or rub heard Lungs: dim in bases with some crackles Abdomen: soft, nontender Extremities: + BLE edema Wound: incisions healing well  Lab Results:  Basename 04/12/12 0509 04/11/12 0430  NA 134* 134*  K 3.5 3.3*  CL 96 96  CO2 27 28  GLUCOSE 129* 112*  BUN 21 24*  CREATININE 0.71 0.70  CALCIUM 8.6 8.3*  MG -- --  PHOS -- --   No results found for this basename: AST:2,ALT:2,ALKPHOS:2,BILITOT:2,PROT:2,ALBUMIN:2 in the last 72 hours No results found for this basename: LIPASE:2,AMYLASE:2 in the last 72 hours  Basename 04/12/12 0509 04/11/12 0430  WBC 12.2* 10.9*  NEUTROABS -- --  HGB 9.1* 8.8*  HCT 28.4* 27.2*  MCV 82.6 84.0  PLT 413* 371   No results found for this basename: CKTOTAL:4,CKMB:4,TROPONINI:4 in the last 72 hours No components found with this basename: POCBNP:3 No results found for this basename: DDIMER in the last 72 hours No results found for this basename: HGBA1C in the last 72 hours No results found for this basename:  CHOL,HDL,LDLCALC,TRIG,CHOLHDL in the last 72 hours No results found for this basename: TSH,T4TOTAL,FREET3,T3FREE,THYROIDAB in the last 72 hours No results found for this basename: VITAMINB12,FOLATE,FERRITIN,TIBC,IRON,RETICCTPCT in the last 72 hours  Medications: Scheduled    . aspirin EC  81 mg Oral Daily  . atorvastatin  40 mg Oral q1800  . carvedilol  6.25 mg Oral BID WC  . diltiazem  120 mg Oral Q12H  . docusate sodium  200 mg Oral Daily  . feeding supplement  237 mL Oral Daily  . FLUoxetine  20 mg Oral Daily  . furosemide  40 mg Intravenous Once  . furosemide  40 mg Oral BID  . insulin aspart  0-24 Units Subcutaneous Q4H  . metroNIDAZOLE  250 mg Oral Q8H  . pantoprazole  40 mg Oral Q1200  . potassium chloride  10 mEq Intravenous Q1 Hr x 3  . potassium chloride  10 mEq Intravenous Q1 Hr x 3  . potassium chloride  40 mEq Oral BID  . sodium chloride  10-40 mL Intracatheter Q12H  . sodium chloride  3 mL Intravenous Q12H  . warfarin  2.5 mg Oral q1800  . warfarin   Does not apply Once  . Warfarin - Physician Dosing  Inpatient   Does not apply q1800  . DISCONTD: potassium chloride  20 mEq Oral BID     Radiology/Studies:  Dg Chest 2 View  04/11/2012  *RADIOLOGY REPORT*  Clinical Data: Evaluate CHF, shortness of breath  CHEST - 2 VIEW  Comparison: 04/10/2012; 04/09/2012; 04/08/2012  Findings: Unchanged borderline enlarged cardiac silhouette and mediastinal contours and median sternotomy, CABG and valve replacement/repair.  Stable position of support apparatus.  Grossly unchanged small bilateral effusions and bibasilar opacities. Suspect hyperinflated lungs with persistent upper lung and peripheral predominant heterogeneous opacities.  Cephalization of flow.  Vascular calcifications.  Grossly unchanged bones including accentuated thoracic kyphosis.  Post cholecystectomy.  IMPRESSION: 1.  Grossly unchanged findings of small bilateral effusions and bibasilar opacities favored to represent  atelectasis. 2.  Suspect hyperinflated lungs with superimposed pulmonary edema, though note, atypical infection may have a similar appearance.  Original Report Authenticated By: [name], M.D.   Dg Abd 1 View  04/11/2012  *RADIOLOGY REPORT*  Clinical Data: Diarrhea.  Abdominal distention.  Diabetes.  ABDOMEN - 1 VIEW  Comparison: 04/08/2012  Findings: Epicardial pacer leads noted.  Atherosclerotic calcification of the abdominal aorta is present.  Lumbar spondylosis noted.  Mild degenerative spurring of the hips is present.  The bowel gas pattern appears unremarkable.  IMPRESSION:  1.  Unremarkable bowel gas pattern.  Original Report Authenticated By: Dellia Cloud, M.D.    INR:1.90 Will add last result for INR, ABG once components are confirmed Will add last 4 CBG results once components are confirmed  Assessment/Plan: S/P Procedure(s) (LRB): CORONARY ARTERY BYPASS GRAFTING (CABG) (N/A) MITRAL VALVE REPAIR (MVR) (N/A)  LOS: 17 days   1. Making steady progress 2. Continue diuresis and replace K+ , getting IV lasix this am 3. Rhythm management - on coreg and cardizem , may have to consider change from cardizem with ventricular ectopy- cardiology following. On coumadin AC RX 4. WBC slightly up- follow. H/H stable . Renal fxn stable 5. D/c colace for now with loose stools. On flagyl, cont(?)- cdiff negative GOLD,WAYNE E 4/17/20138:53 AM    I have seen and examined the patient and agree with the assessment and plan as outlined.  D/C flagyl  Asencion Loveday H 04/12/2012 9:28 AM

## 2012-04-12 NOTE — Progress Notes (Addendum)
Nutrition Follow-up  Pt states her appetite is doing OK. PO intake variable at 10-50% per flowsheet records. Drinking Glucerna Shake supplement daily. Feeling overall better; some loose stools this am per chart review.  Diet Order:  Carbohydrate Modified Medium Calorie  Meds: Scheduled Meds:   . aspirin EC  81 mg Oral Daily  . atorvastatin  40 mg Oral q1800  . carvedilol  6.25 mg Oral BID WC  . diltiazem  120 mg Oral Q12H  . feeding supplement  237 mL Oral Daily  . FLUoxetine  20 mg Oral Daily  . furosemide  40 mg Intravenous Once  . furosemide  40 mg Oral BID  . insulin aspart  0-24 Units Subcutaneous Q4H  . pantoprazole  40 mg Oral Q1200  . potassium chloride  40 mEq Oral BID  . sodium chloride  10-40 mL Intracatheter Q12H  . sodium chloride  3 mL Intravenous Q12H  . warfarin  2.5 mg Oral q1800  . Warfarin - Physician Dosing Inpatient   Does not apply q1800  . DISCONTD: docusate sodium  200 mg Oral Daily  . DISCONTD: metroNIDAZOLE  250 mg Oral Q8H  . DISCONTD: potassium chloride  20 mEq Oral BID   Continuous Infusions:  PRN Meds:.sodium chloride, acetaminophen, albuterol, diphenoxylate-atropine, metoprolol, ondansetron (ZOFRAN) IV, sodium chloride, sodium chloride, traMADol  Labs:  CMP     Component Value Date/Time   NA 134* 04/12/2012 0509   K 3.5 04/12/2012 0509   CL 96 04/12/2012 0509   CO2 27 04/12/2012 0509   GLUCOSE 129* 04/12/2012 0509   BUN 21 04/12/2012 0509   CREATININE 0.71 04/12/2012 0509   CALCIUM 8.6 04/12/2012 0509   PROT 6.1 04/09/2012 0515   ALBUMIN 3.1* 04/09/2012 0515   AST 30 04/09/2012 0515   ALT 28 04/09/2012 0515   ALKPHOS 78 04/09/2012 0515   BILITOT 0.7 04/09/2012 0515   GFRNONAA 80* 04/12/2012 0509   GFRAA >90 04/12/2012 0509     Intake/Output Summary (Last 24 hours) at 04/12/12 1538 Last data filed at 04/12/12 1230  Gross per 24 hour  Intake    480 ml  Output   1002 ml  Net   -522 ml    CBG (last 3)   Basename 04/12/12 1129 04/12/12 0435  04/12/12 0025  GLUCAP 203* 139* 77    Weight Status:  79 kg (4/17) -- weight up some  Re-estimated needs:  1700-1900 kcals, 80-90 gm protein  Nutrition Dx:  Inadequate Oral Intake, ongoing  Goal:  Oral intake to meet >90% of estimated nutrition needs, progressing Monitor: PO intake, labs, weight, I/O's  Intervention:    Continue Glucerna Shake PO daily   RD to follow for nutrition care plan  Alger Memos Pager #:  [phone]

## 2012-04-12 NOTE — Progress Notes (Signed)
CARDIAC REHAB PHASE I   PRE:  Rate/Rhythm: 71-75 afib  BP:  Supine:   Sitting: 103/51  Standing:    SaO2: 96% 2L  MODE:  Ambulation: 350 ft   POST:  Rate/Rhythem: 96  BP:  Supine:   Sitting: 119/49  Standing:    SaO2: 93%2L 1407-1430 Pt walked 350 ft on 2L with rolling walker and asst x 2. Gait fairly steady. Put pt shoes on prior to walk and pt said this helped with her steadiness. To recliner after walk. Can be asst x 1 with walker. Call bell in reach.  Duanne Limerick

## 2012-04-12 NOTE — Progress Notes (Addendum)
Patient wishes to be placed @ [facility] @ discharge-husband undergoing radiation for Ca  Patient notes hx of irritable bowel - not on medication @ home  If  Blood pressure increases she will need ace-I for preop MI

## 2012-04-12 NOTE — Progress Notes (Signed)
Physical Therapy Treatment Patient Details Name: [name] MRN: [id number] DOB: [birth year] Today's Date: 04/12/2012  PT Assessment/Plan  PT - Assessment/Plan Comments on Treatment Session: 78 y.o. female s/p CABG.  Set up schedule with cardiac rehab today.  The patient has been having runs of v-tach this morning, so did not push gait as far as usual.  Patient also reporting upper back and right sided chest pain with mobility.   PT Plan: Discharge plan remains appropriate;Frequency remains appropriate PT Frequency: Min 3X/week Follow Up Recommendations: Skilled nursing facility (patient would prefer [facility] in [place]) Equipment Recommended: Defer to next venue PT Goals  Acute Rehab PT Goals PT Goal: Rolling Supine to Left Side - Progress: Met PT Goal: Supine/Side to Sit - Progress: Progressing toward goal PT Goal: Sit to Stand - Progress: Progressing toward goal PT Goal: Stand to Sit - Progress: Progressing toward goal PT Goal: Ambulate - Progress: Progressing toward goal Additional Goals PT Goal: Additional Goal #1 - Progress: Progressing toward goal  PT Treatment Precautions/Restrictions  Precautions Precautions: Sternal Precaution Comments: verbally reviewed sternal precautions with patient.   Restrictions Weight Bearing Restrictions: No Mobility (including Balance) Bed Mobility Rolling Left: 6: Modified independent (Device/Increase time) Left Sidelying to Sit: 4: Min assist Left Sidelying to Sit Details (indicate cue type and reason): min assist to support trunk due to patient not using arms.   Sitting - Scoot to Edge of Bed: 7: Independent Transfers Sit to Stand: 4: Min assist;From bed;From elevated surface;From toilet;3: Mod assist Sit to Stand Details (indicate cue type and reason): min assist from elevated bed to support trunk.  Patient with hands on knees, mod assist from low toliet with hands on knees to stand.   Stand to Sit: 4: Min assist;To  chair/3-in-1;To toilet Stand to Sit Details: min assist to help  patient lower to toliet and to chair with hands on knees, uncontrolled "plop" descent to sit.   Ambulation/Gait Ambulation/Gait Assistance: 4: Min assist Ambulation/Gait Assistance Details (indicate cue type and reason): min guard assist to steady patient for balance and help steer RW at times.  2L O2 [place] used during gait.  No signs iof increased DOE, occational 2-3 beat run of v-tach, but nothing symptomatic or sustained.   Ambulation Distance (Feet): 150 Feet Assistive device: Rolling walker Gait Pattern: Step-through pattern;Shuffle;Trunk flexed    End of Session PT - End of Session Equipment Utilized During Treatment:  (2 L O2 [place]) Activity Tolerance: Patient limited by pain;Patient limited by fatigue Patient left: in chair;with call bell in reach General Behavior During Session: [facility] for tasks performed Cognition: [facility] for tasks performed  Tanda Morrissey B. Rowyn Mustapha, PT, DPT [phone] 04/12/2012, 3:49 PM

## 2012-04-12 NOTE — Progress Notes (Signed)
Clinical Social Work Department CLINICAL SOCIAL WORK PLACEMENT NOTE 04/12/2012  Patient:  [name], [name]  Account Number:  [id number] Admit date:  03/26/2012  Clinical Social Worker:  Baxter Flattery, LCSWA  Date/time:  04/12/2012 04:00 PM  Clinical Social Work is seeking post-discharge placement for this patient at the following level of care:   SKILLED NURSING   (*CSW will update this form in Epic as items are completed)   04/11/2012  Patient/family provided with [facility] Department of Clinical Social Work's list of facilities offering this level of care within the geographic area requested by the patient (or if unable, by the patient's family).  04/11/2012  Patient/family informed of their freedom to choose among providers that offer the needed level of care, that participate in Medicare, Medicaid or managed care program needed by the patient, have an available bed and are willing to accept the patient.  04/11/2012  Patient/family informed of [facility]' ownership interest in [facility], as well as of the fact that they are under no obligation to receive care at this facility.  PASARR submitted to EDS on 04/11/2012 PASARR number received from EDS on 04/12/2012  FL2 transmitted to all facilities in geographic area requested by pt/family on  04/11/2012 FL2 transmitted to all facilities within larger geographic area on   Patient informed that his/her managed care company has contracts with or will negotiate with  certain facilities, including the following:     Patient/family informed of bed offers received:   Patient chooses bed at  Physician recommends and patient chooses bed at    Patient to be transferred to  on   Patient to be transferred to facility by   The following physician request were entered in Epic:   Additional Comments: Family only interested in [facility]

## 2012-04-13 DIAGNOSIS — Z951 Presence of aortocoronary bypass graft: Secondary | ICD-10-CM

## 2012-04-13 DIAGNOSIS — R9430 Abnormal result of cardiovascular function study, unspecified: Secondary | ICD-10-CM

## 2012-04-13 DIAGNOSIS — Z789 Other specified health status: Secondary | ICD-10-CM

## 2012-04-13 DIAGNOSIS — Z7901 Long term (current) use of anticoagulants: Secondary | ICD-10-CM

## 2012-04-13 DIAGNOSIS — Z9889 Other specified postprocedural states: Secondary | ICD-10-CM

## 2012-04-13 LAB — PROTIME-INR: INR: 2.14 — ABNORMAL HIGH (ref 0.00–1.49)

## 2012-04-13 LAB — GLUCOSE, CAPILLARY
Glucose-Capillary: 101 mg/dL — ABNORMAL HIGH (ref 70–99)
Glucose-Capillary: 109 mg/dL — ABNORMAL HIGH (ref 70–99)
Glucose-Capillary: 140 mg/dL — ABNORMAL HIGH (ref 70–99)
Glucose-Capillary: 207 mg/dL — ABNORMAL HIGH (ref 70–99)
Glucose-Capillary: 209 mg/dL — ABNORMAL HIGH (ref 70–99)

## 2012-04-13 MED ORDER — WARFARIN SODIUM 1 MG PO TABS
1.0000 mg | ORAL_TABLET | Freq: Once | ORAL | Status: AC
  Administered 2012-04-13: 1 mg via ORAL
  Filled 2012-04-13: qty 1

## 2012-04-13 MED ORDER — WARFARIN SODIUM 2.5 MG PO TABS
2.5000 mg | ORAL_TABLET | Freq: Every day | ORAL | Status: DC
  Filled 2012-04-13: qty 1

## 2012-04-13 MED ORDER — METFORMIN HCL 500 MG PO TABS
500.0000 mg | ORAL_TABLET | Freq: Every day | ORAL | Status: DC
  Administered 2012-04-13 – 2012-04-17 (×5): 500 mg via ORAL
  Filled 2012-04-13 (×7): qty 1

## 2012-04-13 MED ORDER — LINAGLIPTIN 5 MG PO TABS
5.0000 mg | ORAL_TABLET | Freq: Every day | ORAL | Status: DC
  Administered 2012-04-13 – 2012-04-17 (×5): 5 mg via ORAL
  Filled 2012-04-13 (×6): qty 1

## 2012-04-13 MED ORDER — INSULIN ASPART 100 UNIT/ML ~~LOC~~ SOLN
0.0000 [IU] | SUBCUTANEOUS | Status: DC
  Administered 2012-04-13 (×2): 8 [IU] via SUBCUTANEOUS
  Administered 2012-04-14: 2 [IU] via SUBCUTANEOUS
  Administered 2012-04-14: 4 [IU] via SUBCUTANEOUS
  Administered 2012-04-14: 2 [IU] via SUBCUTANEOUS
  Administered 2012-04-15 (×3): 4 [IU] via SUBCUTANEOUS
  Administered 2012-04-16 (×2): 2 [IU] via SUBCUTANEOUS

## 2012-04-13 NOTE — Progress Notes (Signed)
Pt had 2.2 sec pause.  Other VS stable at the time.  Pt was asymptomatic.  Informed R. Barrett PAC of this information.  Will continue to monitor.

## 2012-04-13 NOTE — Progress Notes (Addendum)
10 Days Post-Op Procedure(s) (LRB): CORONARY ARTERY BYPASS GRAFTING (CABG) (N/A) MITRAL VALVE REPAIR (MVR) (N/A)  Subjective: Patient feeling better this am. Denies nausea, emesis, or abdominal pain. Did have some loose stools yesterday.  Objective: Vital signs in last 24 hours: Patient Vitals for the past 24 hrs:  BP Temp Temp src Pulse Resp SpO2 Weight  04/13/12 0354 113/61 mmHg 98 F (36.7 C) Oral 61  16  96 % 166 lb 14.2 oz (75.7 kg)  04/12/12 2007 111/58 mmHg 97.5 F (36.4 C) Oral 69  18  97 % -  04/12/12 1351 100/62 mmHg 97.6 F (36.4 C) Oral 73  18  96 % -  04/12/12 0812 140/61 mmHg - - 85  - - -   Pre op weight  72 kg Current Weight  04/13/12 166 lb 14.2 oz (75.7 kg)      Intake/Output from previous day: 04/17 0701 - 04/18 0700 In: 720 [P.O.:720] Out: 1702 [Urine:1700; Stool:2]   Physical Exam:  Cardiovascular: IRRR, IRRR; no murmurs. Pulmonary: Diminished at bases; no rales, wheezes, or rhonchi. Abdomen: Soft, non tender, bowel sounds present. Extremities: Mild bilateral lower extremity edema. Wounds: Clean and dry.  No erythema or signs of infection.  Lab Results: CBC: Basename 04/12/12 0509 04/11/12 0430  WBC 12.2* 10.9*  HGB 9.1* 8.8*  HCT 28.4* 27.2*  PLT 413* 371   BMET:  Basename 04/12/12 0509 04/11/12 0430  NA 134* 134*  K 3.5 3.3*  CL 96 96  CO2 27 28  GLUCOSE 129* 112*  BUN 21 24*  CREATININE 0.71 0.70  CALCIUM 8.6 8.3*    PT/INR:  Basename 04/13/12 0500  LABPROT 24.3*  INR 2.14*   ABG:  INR: Will add last result for INR, ABG once components are confirmed Will add last 4 CBG results once components are confirmed  Assessment/Plan:  1. CV - Post Op afib with CVR.On Coreg 6.25 bid, Cardizem 120 bid, and Coumadin.Will give 1 mg of Coumadin tonight as INR increased. 2.  Pulmonary - Encourage incentive spirometer. 3. Volume Overload - Continue with diuresis. 4.  Acute blood loss anemia - Last H/H 9.1/28.4. 5.DM-CBGs 156/140/109.Pre  op HGA1C 6.9 pre op. Will restart Janumet at a reduced dose.   ZIMMERMAN,DONIELLE MPA-C 04/13/2012   patient examined and medical record reviewed,agree with above note.  SNF at D/C patient requests [facility] hopefully Mon INR goal 2.0-2.5 VAN TRIGT III,Savannah Erbe 04/13/2012

## 2012-04-13 NOTE — Progress Notes (Signed)
CARDIAC REHAB PHASE I   PRE:  Rate/Rhythm: 79AFIB  BP:  Supine:   Sitting: 111/57  Standing:    SaO2: 9853L  MODE:  Ambulation: 550 ft   POST:  Rate/Rhythem: 102 afib  LOTS OF WIDE BEATS,? ABBERACY  BP:  Supine:   Sitting: 119/53  Standing:    SaO2: 94%2L hall,93%2L room 0905-0930 Pt walked 550 ft on oxygen on 2L with rolling walker and asst x 1. Put pt's shoes on for walk. Checked sats in hall at 94% 2L. Stopped several times to rest during walk. To recliner after walk. Pt having lots of wide beats along with afib. Pt' RN aware. Left on 2L oxygen. Call bell in reach.  Duanne Limerick

## 2012-04-13 NOTE — Progress Notes (Signed)
Patient ID: [name], female   DOB: [birth year], 78 y.o.   MRN: [id number]   SUBJECTIVE:  Patient continues to progress after her cardiac surgery.   Filed Vitals:   04/12/12 0812 04/12/12 1351 04/12/12 2007 04/13/12 0354  BP: 140/61 100/62 111/58 113/61  Pulse: 85 73 69 61  Temp:  97.6 F (36.4 C) 97.5 F (36.4 C) 98 F (36.7 C)  TempSrc:  Oral Oral Oral  Resp:  18 18 16   Height:      Weight:    166 lb 14.2 oz (75.7 kg)  SpO2:  96% 97% 96%    Intake/Output Summary (Last 24 hours) at 04/13/12 1033 Last data filed at 04/13/12 0818  Gross per 24 hour  Intake    720 ml  Output   1702 ml  Net   -982 ml    LABS: Basic Metabolic Panel:  Basename 04/12/12 0509 04/11/12 0430  NA 134* 134*  K 3.5 3.3*  CL 96 96  CO2 27 28  GLUCOSE 129* 112*  BUN 21 24*  CREATININE 0.71 0.70  CALCIUM 8.6 8.3*  MG -- --  PHOS -- --   Liver Function Tests: No results found for this basename: AST:2,ALT:2,ALKPHOS:2,BILITOT:2,PROT:2,ALBUMIN:2 in the last 72 hours No results found for this basename: LIPASE:2,AMYLASE:2 in the last 72 hours CBC:  Basename 04/12/12 0509 04/11/12 0430  WBC 12.2* 10.9*  NEUTROABS -- --  HGB 9.1* 8.8*  HCT 28.4* 27.2*  MCV 82.6 84.0  PLT 413* 371   Cardiac Enzymes: No results found for this basename: CKTOTAL:3,CKMB:3,CKMBINDEX:3,TROPONINI:3 in the last 72 hours BNP: No components found with this basename: POCBNP:3 D-Dimer: No results found for this basename: DDIMER:2 in the last 72 hours Hemoglobin A1C: No results found for this basename: HGBA1C in the last 72 hours Fasting Lipid Panel: No results found for this basename: CHOL,HDL,LDLCALC,TRIG,CHOLHDL,LDLDIRECT in the last 72 hours Thyroid Function Tests: No results found for this basename: TSH,T4TOTAL,FREET3,T3FREE,THYROIDAB in the last 72 hours  RADIOLOGY: Dg Chest 2 View  04/11/2012  *RADIOLOGY REPORT*  Clinical Data: Evaluate CHF, shortness of breath  CHEST - 2 VIEW  Comparison: 04/10/2012;  04/09/2012; 04/08/2012  Findings: Unchanged borderline enlarged cardiac silhouette and mediastinal contours and median sternotomy, CABG and valve replacement/repair.  Stable position of support apparatus.  Grossly unchanged small bilateral effusions and bibasilar opacities. Suspect hyperinflated lungs with persistent upper lung and peripheral predominant heterogeneous opacities.  Cephalization of flow.  Vascular calcifications.  Grossly unchanged bones including accentuated thoracic kyphosis.  Post cholecystectomy.  IMPRESSION: 1.  Grossly unchanged findings of small bilateral effusions and bibasilar opacities favored to represent atelectasis. 2.  Suspect hyperinflated lungs with superimposed pulmonary edema, though note, atypical infection may have a similar appearance.  Original Report Authenticated By: [name], M.D.   Dg Chest 2 View  04/08/2012  *RADIOLOGY REPORT*  Clinical Data: Postop CABG and mitral valve repair.  CHEST - 2 VIEW  Comparison: 04/07/2012 and 04/06/2012.  Findings: 0522 hours.  Right arm PICC appears unchanged.  Heart size and mediastinal contours are stable status post CABG and mitral valvuloplasty.  Epicardial pacing leads are noted.  There are small bilateral pleural effusions with scattered atelectasis and possible mild edema.  No pneumothorax is evident.  IMPRESSION: Slight overall improvement in pulmonary aeration with persistent small pleural effusions and scattered atelectasis.  No pneumothorax.  Original Report Authenticated By: Gerrianne Scale, M.D.   Dg Chest 2 View  04/01/2012  *RADIOLOGY REPORT*  Clinical Data: Preop open  heart surgery  CHEST - 2 VIEW  Comparison: 03/28/2012  Findings: Cardiomegaly with suspected mild interstitial edema and small bilateral pleural effusions.  No pneumothorax.  Degenerative changes of the visualized thoracolumbar spine.  Left lateral rib deformities.  Cholecystectomy clips.  IMPRESSION: Cardiomegaly with suspected mild interstitial  edema and small bilateral pleural effusions.  Original Report Authenticated By: Charline Bills, M.D.   Dg Chest 2 View  03/28/2012  *RADIOLOGY REPORT*  Clinical Data: Preop CABG.  CHEST - 2 VIEW  Comparison: 03/26/2012  Findings: Mild cardiomegaly.  Small bilateral pleural effusions. Diffuse interstitial prominence slightly improved, likely mild residual interstitial edema versus chronic interstitial lung disease.  Chronic post-traumatic or postoperative deformity of the left hemithorax.  IMPRESSION: Improving but persistent interstitial prominence, likely interstitial edema.  This could also reflect underlying chronic interstitial lung disease.  Small effusions.  Original Report Authenticated By: Cyndie Chime, M.D.   Dg Abd 1 View  04/11/2012  *RADIOLOGY REPORT*  Clinical Data: Diarrhea.  Abdominal distention.  Diabetes.  ABDOMEN - 1 VIEW  Comparison: 04/08/2012  Findings: Epicardial pacer leads noted.  Atherosclerotic calcification of the abdominal aorta is present.  Lumbar spondylosis noted.  Mild degenerative spurring of the hips is present.  The bowel gas pattern appears unremarkable.  IMPRESSION:  1.  Unremarkable bowel gas pattern.  Original Report Authenticated By: Dellia Cloud, M.D.   Dg Chest Port 1 View  04/10/2012  *RADIOLOGY REPORT*  Clinical Data: Post heart surgery  PORTABLE CHEST - 1 VIEW  Comparison: Portable exam 0643 hours compared to 04/09/2012  Findings: Right arm PICC line, tip projecting over SVC near cavoatrial junction. Enlargement of cardiac silhouette post CABG and MVR. Pulmonary vascular congestion. Slightly improved pulmonary edema, most persistent in the left upper lobe. Persistent left basilar atelectasis and small effusion. No pneumothorax. Bones diffusely demineralized.  IMPRESSION: Slightly improved pulmonary edema.  Original Report Authenticated By: Lollie Marrow, M.D.   Dg Chest Port 1 View  04/09/2012  *RADIOLOGY REPORT*  Clinical Data: CABG  PORTABLE CHEST  - 1 VIEW  Comparison: Chest radiograph 04/08/2012  Findings: Sternal wires overlie stable enlarged heart silhouette. There is decreased lung volumes compared to prior.  There is perihilar air space disease which is not improved.  Bilateral pleural effusions. No pneumothorax.  IMPRESSION:  1.  Decrease in lung volumes appear to prior. 2.  Bilateral pleural effusion may be slightly increased. 3.  No improvement in perihilar air space disease.  Findings suggest congestive heart failure.  Original Report Authenticated By: Genevive Bi, M.D.   Dg Chest Port 1 View  04/07/2012  *RADIOLOGY REPORT*  Clinical Data: Status post CABG.  PORTABLE CHEST - 1 VIEW  Comparison: Chest x-ray 04/06/2012.  Findings: There is a right upper extremity PICC with tip terminating in the distal superior vena cava. The patient is status post median sternotomyfor CABG and mitral annuloplasty. Previously noted right internal jugular cordis has been removed.  Lung volumes remain low.  There continues to be cephalization of the pulmonary vasculature with indistinct interstitial markings with patchy airspace opacities throughout the lungs bilaterally, slightly improved compared to yesterday's examination, likely represent improving moderate pulmonary edema.  More focal opacity in the left lower lobe is likely represents a resolving area of postoperative subsegmental atelectasis and overlying small left-sided pleural effusion.  Previously noted left-sided chest tube has been removed. Heart size remains mildly enlarged.  Mediastinal contours are unchanged compared to recent priors.  IMPRESSION: 1.  Support apparatus, as above. 2.  Improving pulmonary edema, decreasing left basilar subsegmental atelectasis and decreasing small left-sided pleural effusion.  Original Report Authenticated By: Florencia Reasons, M.D.   Dg Chest Port 1 View  04/06/2012  *RADIOLOGY REPORT*  Clinical Data: PICC line placement.  PORTABLE CHEST - 1 VIEW  Comparison:  04/06/2012  Findings: Right PICC line is in place with the tip at the cavoatrial junction.  Left chest tube in stable position.  Patchy bilateral airspace opacities and small left effusion, unchanged. No pneumothorax.  Cardiomegaly.  Prior CABG and valve replacement.  IMPRESSION: Right PICC line tip at the cavoatrial junction.  Otherwise no change.  Original Report Authenticated By: Cyndie Chime, M.D.   Dg Chest Port 1 View  04/06/2012  *RADIOLOGY REPORT*  Clinical Data: Postop (CABG)  PORTABLE CHEST - 1 VIEW  Comparison: 04/05/2012; 04/04/2012; 04/03/2012  Findings:  Grossly unchanged enlarged cardiac silhouette and mediastinal contours post median sternotomy, CABG and valve replacement. Interval removal of the right-sided chest tube.  Otherwise, stable positioning of remaining support apparatus.  No pneumothorax. Grossly unchanged small bilateral pleural effusions, left greater than right.  No change to minimal increase in bilateral mid and upper lung heterogeneous opacities.  Grossly unchanged bones.  IMPRESSION: 1.  Interval removal of the right-sided chest tube.  Otherwise, stable positioning of support apparatus.  No pneumothorax. 2.  Grossly unchanged small bilateral effusions, left greater than right. 3.  No change to minimal increase in bilateral mid and upper lung heterogeneous opacities, possibly atelectasis.  Original Report Authenticated By: [name], M.D.   Dg Chest Portable 1 View In Am  04/05/2012  *RADIOLOGY REPORT*  Clinical Data: Bypass surgery.  PORTABLE CHEST - 1 VIEW  Comparison: 04/04/2012.  Findings: The Swan-Ganz catheter has been removed.  The IJ Cordis is still in place.  There are bilateral chest tubes which are stable.  No definite pneumothorax.  Persistent low lung volumes with vascular crowding and atelectasis.  Persistent vascular congestion and small effusions.  IMPRESSION:  1.  Removal of Swan-Ganz catheter and mediastinal drain tubes. 2.  Stable bilateral chest  tubes.  No pneumothorax. 3.  Persistent vascular congestion, areas of atelectasis and small effusions.  Original Report Authenticated By: P. Loralie Champagne, M.D.   Dg Chest Portable 1 View In Am  04/04/2012  *RADIOLOGY REPORT*  Clinical Data: Postop open heart surgery  PORTABLE CHEST - 1 VIEW  Comparison: April 03, 2012  Findings: The ET tube and nasogastric tube have been removed.  The right IJ Swan-Ganz catheter tip remains in good position. Bilateral chest tubes and mediastinal drain are stable in position. No pneumothorax.  The pulmonary vasculature is within normal limits.  There is bibasilar atelectasis and a left pleural effusion.  The effusion appears slightly larger now.  IMPRESSION: Slight interval increase in size of left pleural effusion.  Original Report Authenticated By: Brandon Melnick, M.D.   Dg Chest Portable 1 View  04/03/2012  *RADIOLOGY REPORT*  Clinical Data: Coronary bypass, postoperative exam  PORTABLE CHEST - 1 VIEW  Comparison: 04/03/2012  Findings: Coronary bypass changes and mitral valve replacement noted.  Endotracheal tube 8 cm above the carina.  Swan-Ganz catheter in the proximal pulmonary outflow tract directed to the right.  NG tube tip not visualize but the tube enters the stomach. Chest tubes present bilaterally.  Heart is enlarged with vascular congestion and basilar atelectasis.  Small effusions evident.  No significant pneumothorax.  Slight improvement in lung volumes since the prior study.  IMPRESSION:  Support apparatus in good position.  Cardiomegaly with vascular congestion and basilar atelectasis.  Original Report Authenticated By: Judie Petit. Ruel Favors, M.D.   Dg Chest Portable 1 View  04/03/2012  *RADIOLOGY REPORT*  Clinical Data: Loss of hemoclip cartilage.  PORTABLE CHEST - 1 VIEW  Comparison: None.  Findings: Intraoperative view of the chest to include the upper abdomen demonstrates mediastinal tubes, Swan Ganz catheter, endotracheal tube, a transesophageal echo probe, and  chest tubes. There are no hemoclip cartilage clip dispensers observed.  IMPRESSION: Negative  for lost instrument/clips.  I called this report directly to the operating room while the patient was still on the table.  Original Report Authenticated By: Elsie Stain, M.D.   Dg Chest Portable 1 View  03/26/2012  *RADIOLOGY REPORT*  Clinical Data: Pressure, shortness of breath.  PORTABLE CHEST - 1 VIEW  Comparison: No priors.  Findings: Lung volumes are low.  There is cephalization of the pulmonary vasculature and indistinctness of the interstitial markings with multiple Kerley B lines throughout the periphery of the lungs bilaterally, compatible with mild interstitial pulmonary edema.  No definite consolidative airspace disease.  There is blunting of the left costophrenic sulcus which could suggest presence of left-sided pleural effusion, however, there are overlying old left-sided rib fractures laterally, suggesting underlying pleural scarring which is likely chronic.  Heart size is mildly enlarged. The patient is rotated to the left on today's exam, resulting in distortion of the mediastinal contours and reduced diagnostic sensitivity and specificity for mediastinal pathology.  Atherosclerotic calcifications in the arch of the aorta.  IMPRESSION: 1.  Findings compatible with mild congestive heart failure, as above. 2.  Atherosclerosis. 3.  Multiple old healed left-sided rib fractures with probable chronic scarring of the pleura at the left base.  A small left- sided pleural effusion is difficult to exclude.  Original Report Authenticated By: Florencia Reasons, M.D.   Dg Abd Portable 1v  04/08/2012  *RADIOLOGY REPORT*  Clinical Data: 78 year old female with abdominal pain.  PORTABLE ABDOMEN - 1 VIEW  Comparison: 03/24/2007  Findings: The bowel gas pattern is unremarkable. No dilated bowel loops are identified. No suspicious calcifications are noted. No acute bony abnormalities are identified. Degenerative  changes in the lumbar spine are again noted.  IMPRESSION: No evidence of acute or significant abdominal abnormality.  Original Report Authenticated By: Rosendo Gros, M.D.    PHYSICAL EXAM  Patient is oriented to person time and place. Affect is normal. She's comfortable sitting in the chair. Lungs reveal scattered rhonchi. Cardiac exam reveals S1 and S2. The rhythm is irregularly irregular.   TELEMETRY: Have personally reviewed telemetry. His atrial fib with controlled rate.   ASSESSMENT AND PLAN:    Atrial fibrillation   Atrial fib rate is controlled. The patient did not tolerate amiodarone. Patient is on Coumadin will have rate control.   Systolic CHF, acute on chronic   Patient is being diuresed   Hx of CABG  S/P MVR (mitral valve repair)    The patient is recovering post CABG and mitral valve repair.   Warfarin anticoagulation    Patient is being coumadinized.   Ejection fraction < 50%   Medication intolerance   Patient is intolerant to amiodarone.   Willa Rough 04/13/2012 10:33 AM

## 2012-04-14 ENCOUNTER — Encounter (HOSPITAL_COMMUNITY): Payer: Self-pay | Admitting: Physician Assistant

## 2012-04-14 DIAGNOSIS — Z951 Presence of aortocoronary bypass graft: Secondary | ICD-10-CM

## 2012-04-14 LAB — GLUCOSE, CAPILLARY

## 2012-04-14 LAB — PROTIME-INR: INR: 2.32 — ABNORMAL HIGH (ref 0.00–1.49)

## 2012-04-14 MED ORDER — POTASSIUM CHLORIDE CRYS ER 20 MEQ PO TBCR: 40.0000 meq | EXTENDED_RELEASE_TABLET | Freq: Two times a day (BID) | ORAL | Status: DC

## 2012-04-14 MED ORDER — CARVEDILOL 6.25 MG PO TABS: 6.2500 mg | ORAL_TABLET | Freq: Two times a day (BID) | ORAL | Status: DC

## 2012-04-14 MED ORDER — WARFARIN SODIUM 1 MG PO TABS
1.0000 mg | ORAL_TABLET | Freq: Every day | ORAL | Status: DC
  Administered 2012-04-14 – 2012-04-15 (×2): 1 mg via ORAL
  Filled 2012-04-14 (×3): qty 1

## 2012-04-14 MED ORDER — TRAMADOL HCL 50 MG PO TABS: 50.0000 mg | ORAL_TABLET | Freq: Four times a day (QID) | ORAL | Status: AC | PRN

## 2012-04-14 MED ORDER — DILTIAZEM HCL ER 120 MG PO CP12: 120.0000 mg | ORAL_CAPSULE | Freq: Two times a day (BID) | ORAL | Status: DC

## 2012-04-14 MED ORDER — WARFARIN SODIUM 1 MG PO TABS: 1.0000 mg | ORAL_TABLET | Freq: Every day | ORAL | Status: DC

## 2012-04-14 MED ORDER — FUROSEMIDE 40 MG PO TABS: 40.0000 mg | ORAL_TABLET | Freq: Two times a day (BID) | ORAL | Status: DC

## 2012-04-14 MED ORDER — ATORVASTATIN CALCIUM 40 MG PO TABS: 40.0000 mg | ORAL_TABLET | Freq: Every day | ORAL | Status: DC

## 2012-04-14 MED ORDER — ASPIRIN 81 MG PO TBEC: 81.0000 mg | DELAYED_RELEASE_TABLET | Freq: Every day | ORAL | Status: DC

## 2012-04-14 NOTE — Progress Notes (Signed)
Physical Therapy Treatment Patient Details Name: [name] MRN: [id number] DOB: [birth year] Today's Date: 04/14/2012 Time: 9147-8295 PT Time Calculation (min): 21 min  PT Assessment / Plan / Recommendation Clinical Impression  Pt s/p CABG continues to progress well but needs continued reinforcement of precautions with transfers and breathing technique with mobility. Pt sats 87-90 on RA with ambulation and returned to 2L with sats 95 end of session. Pt encouraged to continue ambulation and HEP     Follow Up Recommendations       Equipment Recommendations       Frequency      Precautions / Restrictions Precautions Precautions: Sternal Precaution Comments: reviewed sternal precautions with pt she was able to recall no pushing, pulling Restrictions Weight Bearing Restrictions: No   Pertinent Vitals/Pain No pain    Mobility  Transfers Transfers: Sit to Stand;Stand to Sit Sit to Stand: 4: Min assist;From chair/3-in-1;From toilet Stand to Sit: 4: Min guard;To toilet;To chair/3-in-1 Details for Transfer Assistance: assist for anterior translation during standing and cueing for hand placement and safety with all transfers Ambulation/Gait Ambulation/Gait Assistance: 5: Supervision Ambulation Distance (Feet): 280 Feet Assistive device: Rolling walker Ambulation/Gait Assistance Details: cueing to step into RW and for breathing technique. Attempted ambulation on RA with drop to 87% during ambulation and varied as high as 90 on RA with activity and back to 95 at rest on 2L Gait Pattern: Step-through pattern;Decreased stride length;Trunk flexed Gait velocity: decreased, not formally assessed Stairs: No    Exercises General Exercises - Lower Extremity Long Arc Quad: AROM;Both;20 reps;Seated Hip ABduction/ADduction: AROM;Both;20 reps;Seated Hip Flexion/Marching: AROM;Both;Other reps (comment);Seated (15reps)   PT Goals Acute Rehab PT Goals PT Goal: Sit to Stand - Progress:  Progressing toward goal PT Goal: Stand to Sit - Progress: Progressing toward goal PT Goal: Ambulate - Progress: Progressing toward goal PT Goal: Up/Down Stairs - Progress: Progressing toward goal PT Goal: Perform Home Exercise Program - Progress: Progressing toward goal Additional Goals Additional Goal #1: Pt will independently state and demonstrate adherence to sternal precautions PT Goal: Additional Goal #1 - Progress: Progressing toward goal  Visit Information  Last PT Received On: 04/14/12 Assistance Needed: +1    Subjective Data  Subjective: I'm feeling better   Cognition  Overall Cognitive Status: Appears within functional limits for tasks assessed/performed Arousal/Alertness: Awake/alert Orientation Level: Appears intact for tasks assessed Behavior During Session: [facility] for tasks performed    Balance     End of Session @[id number] ([id number])@    Toney Sang Beth 04/14/2012, 11:27 AM Delaney Meigs, PT [phone]

## 2012-04-14 NOTE — Progress Notes (Signed)
Patient seen and examined and history reviewed. Agree with above findings and plan. Patient is still volume overloaded but is diuresing. Afib rate control is excellent.  Thedora Hinders 04/14/2012 6:36 PM

## 2012-04-14 NOTE — Progress Notes (Signed)
Patient Name: [name] Date of Encounter: 04/14/2012     Active Problems:  DM  HYPERTENSION  Coronary artery disease  Mitral regurgitation  Atrial fibrillation  Systolic CHF, acute on chronic  Hx of CABG  S/P MVR (mitral valve repair)  Warfarin anticoagulation  Ejection fraction < 50%  Medication intolerance    SUBJECTIVE: Ambulating well. Some shortness of breath on ambulation. Mild R-sided chest soreness, which has improved. No diaphoresis, palpitations.    OBJECTIVE  Filed Vitals:   04/13/12 1400 04/13/12 2046 04/14/12 0209 04/14/12 0420  BP: 102/57 102/52  126/82  Pulse: 72 66  74  Temp: 97.6 F (36.4 C) 97.4 F (36.3 C)  98.4 F (36.9 C)  TempSrc: Oral Oral  Oral  Resp: 18 18  18   Height:      Weight:   75.751 kg (167 lb)   SpO2: 97% 95%  95%    Intake/Output Summary (Last 24 hours) at 04/14/12 1610 Last data filed at 04/14/12 0542  Gross per 24 hour  Intake    480 ml  Output    800 ml  Net   -320 ml   Weight change: 0.051 kg (1.8 oz)  PHYSICAL EXAM  General: Elderly, frail, in no acute distress. Head: Normocephalic, atraumatic, sclera non-icteric, no xanthomas Neck: Supple without bruits or JVD. Lungs:  Resp regular and unlabored, bibasilar rales R>L noted; no wheezes or rhonchi Heart: Irregularly irregular, no s3, s4, or murmurs. Abdomen:  Soft, non-tender, non-distended, BS + x 4.  Msk: Tenderness to palpation of R-sided chest. Mild incisional site tenderness, no erythema or discharge. Strength and tone appears normal for age. Extremities: 1+ pitting, pretibial edema. No clubbing or cyanosis. DP/PT/Radials 2+ and equal bilaterally. Neuro:  Alert and oriented X 3. Moves all extremities spontaneously. Psych:  Normal affect.  LABS:  Recent Labs  [facility] 04/12/12 0509   WBC 12.2*   HGB 9.1*   HCT 28.4*   MCV 82.6   PLT 413*    Lab 04/12/12 0509 04/11/12 0430 04/10/12 0530 04/09/12 0515  NA 134* 134* 130* --  K 3.5 3.3* 3.0* --    CL 96 96 92* --  CO2 27 28 28  --  BUN 21 24* 26* --  CREATININE 0.71 0.70 0.79 --  CALCIUM 8.6 8.3* 8.5 --  PROT -- -- -- 6.1  BILITOT -- -- -- 0.7  ALKPHOS -- -- -- 78  ALT -- -- -- 28  AST -- -- -- 30  AMYLASE -- -- -- --  LIPASE -- -- -- --  GLUCOSE 129* 112* 136* --   TELE: atrial fibrillation, rate 60-90 bpm, trace PVCs   Radiology/Studies:  Dg Chest 2 View  04/11/2012  *RADIOLOGY REPORT*  Clinical Data: Evaluate CHF, shortness of breath  CHEST - 2 VIEW  Comparison: 04/10/2012; 04/09/2012; 04/08/2012  Findings: Unchanged borderline enlarged cardiac silhouette and mediastinal contours and median sternotomy, CABG and valve replacement/repair.  Stable position of support apparatus.  Grossly unchanged small bilateral effusions and bibasilar opacities. Suspect hyperinflated lungs with persistent upper lung and peripheral predominant heterogeneous opacities.  Cephalization of flow.  Vascular calcifications.  Grossly unchanged bones including accentuated thoracic kyphosis.  Post cholecystectomy.  IMPRESSION: 1.  Grossly unchanged findings of small bilateral effusions and bibasilar opacities favored to represent atelectasis. 2.  Suspect hyperinflated lungs with superimposed pulmonary edema, though note, atypical infection may have a similar appearance.  Original Report Authenticated By: [name], M.D.   Dg Chest 2  View  04/08/2012  *RADIOLOGY REPORT*  Clinical Data: Postop CABG and mitral valve repair.  CHEST - 2 VIEW  Comparison: 04/07/2012 and 04/06/2012.  Findings: 0522 hours.  Right arm PICC appears unchanged.  Heart size and mediastinal contours are stable status post CABG and mitral valvuloplasty.  Epicardial pacing leads are noted.  There are small bilateral pleural effusions with scattered atelectasis and possible mild edema.  No pneumothorax is evident.  IMPRESSION: Slight overall improvement in pulmonary aeration with persistent small pleural effusions and scattered atelectasis.   No pneumothorax.  Original Report Authenticated By: Gerrianne Scale, M.D.   Dg Chest 2 View  04/01/2012  *RADIOLOGY REPORT*  Clinical Data: Preop open heart surgery  CHEST - 2 VIEW  Comparison: 03/28/2012  Findings: Cardiomegaly with suspected mild interstitial edema and small bilateral pleural effusions.  No pneumothorax.  Degenerative changes of the visualized thoracolumbar spine.  Left lateral rib deformities.  Cholecystectomy clips.  IMPRESSION: Cardiomegaly with suspected mild interstitial edema and small bilateral pleural effusions.  Original Report Authenticated By: Charline Bills, M.D.   Dg Chest 2 View  03/28/2012  *RADIOLOGY REPORT*  Clinical Data: Preop CABG.  CHEST - 2 VIEW  Comparison: 03/26/2012  Findings: Mild cardiomegaly.  Small bilateral pleural effusions. Diffuse interstitial prominence slightly improved, likely mild residual interstitial edema versus chronic interstitial lung disease.  Chronic post-traumatic or postoperative deformity of the left hemithorax.  IMPRESSION: Improving but persistent interstitial prominence, likely interstitial edema.  This could also reflect underlying chronic interstitial lung disease.  Small effusions.  Original Report Authenticated By: Cyndie Chime, M.D.   Dg Abd 1 View  04/11/2012  *RADIOLOGY REPORT*  Clinical Data: Diarrhea.  Abdominal distention.  Diabetes.  ABDOMEN - 1 VIEW  Comparison: 04/08/2012  Findings: Epicardial pacer leads noted.  Atherosclerotic calcification of the abdominal aorta is present.  Lumbar spondylosis noted.  Mild degenerative spurring of the hips is present.  The bowel gas pattern appears unremarkable.  IMPRESSION:  1.  Unremarkable bowel gas pattern.  Original Report Authenticated By: Dellia Cloud, M.D.   Dg Chest Port 1 View  04/10/2012  *RADIOLOGY REPORT*  Clinical Data: Post heart surgery  PORTABLE CHEST - 1 VIEW  Comparison: Portable exam 0643 hours compared to 04/09/2012  Findings: Right arm PICC line, tip  projecting over SVC near cavoatrial junction. Enlargement of cardiac silhouette post CABG and MVR. Pulmonary vascular congestion. Slightly improved pulmonary edema, most persistent in the left upper lobe. Persistent left basilar atelectasis and small effusion. No pneumothorax. Bones diffusely demineralized.  IMPRESSION: Slightly improved pulmonary edema.  Original Report Authenticated By: Lollie Marrow, M.D.   Dg Chest Port 1 View  04/09/2012  *RADIOLOGY REPORT*  Clinical Data: CABG  PORTABLE CHEST - 1 VIEW  Comparison: Chest radiograph 04/08/2012  Findings: Sternal wires overlie stable enlarged heart silhouette. There is decreased lung volumes compared to prior.  There is perihilar air space disease which is not improved.  Bilateral pleural effusions. No pneumothorax.  IMPRESSION:  1.  Decrease in lung volumes appear to prior. 2.  Bilateral pleural effusion may be slightly increased. 3.  No improvement in perihilar air space disease.  Findings suggest congestive heart failure.  Original Report Authenticated By: Genevive Bi, M.D.   Dg Chest Port 1 View  04/07/2012  *RADIOLOGY REPORT*  Clinical Data: Status post CABG.  PORTABLE CHEST - 1 VIEW  Comparison: Chest x-ray 04/06/2012.  Findings: There is a right upper extremity PICC with tip terminating in the distal superior vena  cava. The patient is status post median sternotomyfor CABG and mitral annuloplasty. Previously noted right internal jugular cordis has been removed.  Lung volumes remain low.  There continues to be cephalization of the pulmonary vasculature with indistinct interstitial markings with patchy airspace opacities throughout the lungs bilaterally, slightly improved compared to yesterday's examination, likely represent improving moderate pulmonary edema.  More focal opacity in the left lower lobe is likely represents a resolving area of postoperative subsegmental atelectasis and overlying small left-sided pleural effusion.  Previously noted  left-sided chest tube has been removed. Heart size remains mildly enlarged.  Mediastinal contours are unchanged compared to recent priors.  IMPRESSION: 1.  Support apparatus, as above. 2.  Improving pulmonary edema, decreasing left basilar subsegmental atelectasis and decreasing small left-sided pleural effusion.  Original Report Authenticated By: Florencia Reasons, M.D.   Dg Chest Port 1 View  04/06/2012  *RADIOLOGY REPORT*  Clinical Data: PICC line placement.  PORTABLE CHEST - 1 VIEW  Comparison: 04/06/2012  Findings: Right PICC line is in place with the tip at the cavoatrial junction.  Left chest tube in stable position.  Patchy bilateral airspace opacities and small left effusion, unchanged. No pneumothorax.  Cardiomegaly.  Prior CABG and valve replacement.  IMPRESSION: Right PICC line tip at the cavoatrial junction.  Otherwise no change.  Original Report Authenticated By: Cyndie Chime, M.D.   Dg Chest Port 1 View  04/06/2012  *RADIOLOGY REPORT*  Clinical Data: Postop (CABG)  PORTABLE CHEST - 1 VIEW  Comparison: 04/05/2012; 04/04/2012; 04/03/2012  Findings:  Grossly unchanged enlarged cardiac silhouette and mediastinal contours post median sternotomy, CABG and valve replacement. Interval removal of the right-sided chest tube.  Otherwise, stable positioning of remaining support apparatus.  No pneumothorax. Grossly unchanged small bilateral pleural effusions, left greater than right.  No change to minimal increase in bilateral mid and upper lung heterogeneous opacities.  Grossly unchanged bones.  IMPRESSION: 1.  Interval removal of the right-sided chest tube.  Otherwise, stable positioning of support apparatus.  No pneumothorax. 2.  Grossly unchanged small bilateral effusions, left greater than right. 3.  No change to minimal increase in bilateral mid and upper lung heterogeneous opacities, possibly atelectasis.  Original Report Authenticated By: [name], M.D.   Dg Chest Portable 1 View In  Am  04/05/2012  *RADIOLOGY REPORT*  Clinical Data: Bypass surgery.  PORTABLE CHEST - 1 VIEW  Comparison: 04/04/2012.  Findings: The Swan-Ganz catheter has been removed.  The IJ Cordis is still in place.  There are bilateral chest tubes which are stable.  No definite pneumothorax.  Persistent low lung volumes with vascular crowding and atelectasis.  Persistent vascular congestion and small effusions.  IMPRESSION:  1.  Removal of Swan-Ganz catheter and mediastinal drain tubes. 2.  Stable bilateral chest tubes.  No pneumothorax. 3.  Persistent vascular congestion, areas of atelectasis and small effusions.  Original Report Authenticated By: P. Loralie Champagne, M.D.   Dg Chest Portable 1 View In Am  04/04/2012  *RADIOLOGY REPORT*  Clinical Data: Postop open heart surgery  PORTABLE CHEST - 1 VIEW  Comparison: April 03, 2012  Findings: The ET tube and nasogastric tube have been removed.  The right IJ Swan-Ganz catheter tip remains in good position. Bilateral chest tubes and mediastinal drain are stable in position. No pneumothorax.  The pulmonary vasculature is within normal limits.  There is bibasilar atelectasis and a left pleural effusion.  The effusion appears slightly larger now.  IMPRESSION: Slight interval increase in size  of left pleural effusion.  Original Report Authenticated By: Brandon Melnick, M.D.   Dg Chest Portable 1 View  04/03/2012  *RADIOLOGY REPORT*  Clinical Data: Coronary bypass, postoperative exam  PORTABLE CHEST - 1 VIEW  Comparison: 04/03/2012  Findings: Coronary bypass changes and mitral valve replacement noted.  Endotracheal tube 8 cm above the carina.  Swan-Ganz catheter in the proximal pulmonary outflow tract directed to the right.  NG tube tip not visualize but the tube enters the stomach. Chest tubes present bilaterally.  Heart is enlarged with vascular congestion and basilar atelectasis.  Small effusions evident.  No significant pneumothorax.  Slight improvement in lung volumes since the  prior study.  IMPRESSION: Support apparatus in good position.  Cardiomegaly with vascular congestion and basilar atelectasis.  Original Report Authenticated By: Judie Petit. Ruel Favors, M.D.   Dg Chest Portable 1 View  04/03/2012  *RADIOLOGY REPORT*  Clinical Data: Loss of hemoclip cartilage.  PORTABLE CHEST - 1 VIEW  Comparison: None.  Findings: Intraoperative view of the chest to include the upper abdomen demonstrates mediastinal tubes, Swan Ganz catheter, endotracheal tube, a transesophageal echo probe, and chest tubes. There are no hemoclip cartilage clip dispensers observed.  IMPRESSION: Negative  for lost instrument/clips.  I called this report directly to the operating room while the patient was still on the table.  Original Report Authenticated By: Elsie Stain, M.D.   Dg Chest Portable 1 View  03/26/2012  *RADIOLOGY REPORT*  Clinical Data: Pressure, shortness of breath.  PORTABLE CHEST - 1 VIEW  Comparison: No priors.  Findings: Lung volumes are low.  There is cephalization of the pulmonary vasculature and indistinctness of the interstitial markings with multiple Kerley B lines throughout the periphery of the lungs bilaterally, compatible with mild interstitial pulmonary edema.  No definite consolidative airspace disease.  There is blunting of the left costophrenic sulcus which could suggest presence of left-sided pleural effusion, however, there are overlying old left-sided rib fractures laterally, suggesting underlying pleural scarring which is likely chronic.  Heart size is mildly enlarged. The patient is rotated to the left on today's exam, resulting in distortion of the mediastinal contours and reduced diagnostic sensitivity and specificity for mediastinal pathology.  Atherosclerotic calcifications in the arch of the aorta.  IMPRESSION: 1.  Findings compatible with mild congestive heart failure, as above. 2.  Atherosclerosis. 3.  Multiple old healed left-sided rib fractures with probable chronic  scarring of the pleura at the left base.  A small left- sided pleural effusion is difficult to exclude.  Original Report Authenticated By: Florencia Reasons, M.D.   Dg Abd Portable 1v  04/08/2012  *RADIOLOGY REPORT*  Clinical Data: 78 year old female with abdominal pain.  PORTABLE ABDOMEN - 1 VIEW  Comparison: 03/24/2007  Findings: The bowel gas pattern is unremarkable. No dilated bowel loops are identified. No suspicious calcifications are noted. No acute bony abnormalities are identified. Degenerative changes in the lumbar spine are again noted.  IMPRESSION: No evidence of acute or significant abdominal abnormality.  Original Report Authenticated By: Rosendo Gros, M.D.    Current Medications:     . aspirin EC  81 mg Oral Daily  . atorvastatin  40 mg Oral q1800  . carvedilol  6.25 mg Oral BID WC  . diltiazem  120 mg Oral Q12H  . feeding supplement  237 mL Oral Daily  . FLUoxetine  20 mg Oral Daily  . furosemide  40 mg Oral BID  . insulin aspart  0-24 Units Subcutaneous Q4H  .  linagliptin  5 mg Oral Daily  . metFORMIN  500 mg Oral Q breakfast  . pantoprazole  40 mg Oral Q1200  . potassium chloride  40 mEq Oral BID  . sodium chloride  10-40 mL Intracatheter Q12H  . sodium chloride  3 mL Intravenous Q12H  . warfarin  1 mg Oral ONCE-1800  . warfarin  1 mg Oral q1800  . Warfarin - Physician Dosing Inpatient   Does not apply q1800  . DISCONTD: warfarin  2.5 mg Oral q1800    ASSESSMENT AND PLAN:  1. Atrial fibrillation- post-MVR/CABG; persistent. Rate-controlled, HR 60-70s. Intolerant to [employer].   - Continue Cardizem  - Continue Coumadin  2. CAD/NSTEMI-  s/p CABG/MVR on 04/03/12; stable, mild chest soreness, improving daily. Incision sites healing well.  - Continue ASA/BB/statin  3. Hypervolemia- in the post-op setting, but also LVEF 35% on echo 04/06/12. Slow diuresis, still ~ 5 kg above admission weight. Peripheral edema noted on exam. Bibasilar rales persist. No shortness of breath  at rest, but does endorse on minimal exertion. No BMET/CBC today to assess renal function, K+.   - Consider Lasix IV today   4. MR- s/p CABG/MVR; stable; no significant stenosis or regurgitation on echo 04/06/12. No murmur on exam.   5. HTN- well-controlled  - Continue antihypertensives  6. Type 2 DM- CBGs better-controlled  - Continue insulin  - Continue oral hypoglycemics     Signed, R. Hurman Horn, PA-C 04/14/2012, 9:07 AM

## 2012-04-14 NOTE — Progress Notes (Signed)
CSW reviewed chart and staffed pt with RNCM. Awaiting response from [facility] regarding bed availability for this pt. Left message with Tammy in admissions. CSW will continue to follow.  Baxter Flattery, MSW [phone]

## 2012-04-14 NOTE — Discharge Summary (Signed)
Physician Discharge Summary  Patient ID: [name] MRN: [id number] DOB/AGE: 02/23/1932 78 y.o.  Admit date: 03/26/2012 Discharge date: 04/17/2012  Admission Diagnoses:  Patient Active Problem List  Diagnoses  . DM  . GLAUCOMA  . HYPERTENSION  . HEMORRHOIDS  . DEGENERATIVE JOINT DISEASE  . DIVERTICULITIS, HX OF  . Coronary artery disease  . Mitral regurgitation  . Atrial fibrillation  . Systolic CHF, acute on chronic  . Hx of CABG  . S/P MVR (mitral valve repair)  . Warfarin anticoagulation  . Ejection fraction < 50%  . Medication intolerance   Discharge Diagnoses:  Patient Active Problem List  Diagnoses  . DM  . GLAUCOMA  . HYPERTENSION  . HEMORRHOIDS  . DEGENERATIVE JOINT DISEASE  . DIVERTICULITIS, HX OF  . Coronary artery disease  . Mitral regurgitation  . Atrial fibrillation  . Systolic CHF, acute on chronic  . Hx of CABG  . S/P MVR (mitral valve repair)  . Warfarin anticoagulation  . Ejection fraction < 50%  . Medication intolerance  . S/P CABG x 4   Discharged Condition: good  History of Present Illness:   [name] is a 78 yo white female with known history of CAD S/P  who presented to the [facility] Emergency Department of 03/26/2012 with a complaint of chest pain.  The patient stated she was awoken from sleep around 3 AM which a severe pressure feeling in her chest that radiated in to her back. The patient also suffered from associated nausea and dyspnea, but did not have vomiting or diaphoresis.  The patient rated the pain 8/10 and states it was worsened with lying flat. The patient stated she had a similar episode the night prior while she was cleaning for which she took Advil and TUMS with no relief.  The patient was treated with NTG which did partially relieve her chest discomfort.  She was given a second NTG with complete resolution of her pain.  EKG obtained was concerning for some new ST depression and T wave inversions, which were changed from  prior EKG in 1999.  Cardiac enzymes obtained were mildly elevated in the CK-MB.  Cardiology was consulted and felt the patient would benefit from admission and further observation.  Therefore the patient was admitted and placed on a Heparin drip.    Hospital Course:   HD #0 The patient was found to be suffering from Acute Coronary syndrome and ruled in for MI.  She was started on NTG paste, aspirin, and low dose beta blocker.  The patient was made NPO for tentative catheterization in the morning.  HD #1 the patient was taken for cardiac catheterization which revealed a mildly reduced EF of 50%, moderate mitral regurgitation and severe three vessel CAD. TTE was also performed which confirmed an EF of 35% and mild mitral valve regurgitation. Therefore, cardiothoracic surgery was consulted.  HD #2 Dr. Donata Clay evaluated the patient and felt she would benefit from revascularization.  However, she would have an increased risk.  The risks and benefits were explained to the patient and her family and she wished to proceed.  HD #5 Dr. Donata Clay again spoke with the patient regarding surgery. It was unclear based on TTE and catheterization whether patient would require intervention for her mitral valve.  She would undergo TEE evaluation in the OR. The risks and benefits were again explained to the patient and she wished to proceed.  HD #8 the patient was taken to the OR and  underwent Mitral Valve Annuloplasty utilizing a 26mm Physio II Ring, CABG x4 utilizing LIMA to LAD, SVG to Circumflex, SVG to Ramus Intermediate, and SVG to PDA, and bilateral endoscopic saphenous vein harvest.  The patient tolerated the procedure well and was taken to the ICU in stable condition.  POD #1 the patient was extubated.  She remained atrially paced.  The patient's mediastinal chest drains were removed.  The patients cardiac drips were weaned off as tolerated. The patient developed Atrial Fibrillation with RVR.  She was treated with IV  Metoprolol and Amiodarone. POD #2 the patient remained on Dopamine.  The patient was transfused PRBCs for perioperative anemia.  The patient remained on IV Amiodarone for A. Fib.  POD #3 the patient continued to require Dopamine.  The patient remained in Atrial Fibrillation. The patient was started on coumadin.  POD #4 the patient continues to suffer from Atrial Fibrillation.  POD #5 the patient's dopamine was weaned off overnight.  She developed diarrhea and C. Diff toxin was obtained and negative.  POD #6 the patient continues to suffer from Atrial Fibrillation despite IV Amiodarone.  She was placed on digoxin for refractory A. Fib.  The patient developed severe nausea and further diarrhea.  She was taken off Amiodarone and started on IV Cardizem.  POD #7 the patient remains in A. Fib, however is rate controlled.  The patient was placed on Coreg and will wean off IV Cardizem and start oral regimen as tolerated.  POD #8 the patient remains in rate controlled A. Fib.  She was doing better and was transferred to the step down unit in stable condition.  POD #10 the patient continues to be in rate controlled A. Fib on Cardizem, Coreg, and Coumadin. She is doing better and is currently medically stable.  She has been weaned to 2 L of oxygen via nasal cannula and will most likely require O2 at the time of discharge. Her INR today was slightly decreased at 2.09. As a result her Coumadin will be increased to 2 mg by mouth every evening or as directed.Provided she remains afebrile, hemodynamic a stable, and pending morning round evaluation, she'll be surgically stable for discharge to an SNF on 04/17/2012.  Disposition: Stable  Medication List  As of 04/16/2012 11:11 AM   STOP taking these medications         olmesartan 40 MG tablet         TAKE these medications         aspirin 81 MG EC tablet   Take 1 tablet (81 mg total) by mouth daily.      atorvastatin 40 MG tablet   Commonly known as: LIPITOR   Take 1  tablet (40 mg total) by mouth daily at 6 PM.      carvedilol 6.25 MG tablet   Commonly known as: COREG   Take 1 tablet (6.25 mg total) by mouth 2 (two) times daily with a meal.      diltiazem 120 MG 12 hr capsule   Commonly known as: CARDIZEM SR   Take 1 capsule (120 mg total) by mouth every 12 (twelve) hours.      FLUoxetine 20 MG tablet   Commonly known as: PROZAC   Take 20 mg by mouth daily.      furosemide 40 MG tablet   Commonly known as: LASIX   Take 1 tablet (40 mg total) by mouth daily. For 5 days then stop.      iron polysaccharides 150  MG capsule   Commonly known as: NIFEREX   Take 1 capsule (150 mg total) by mouth daily. For one month then stop.      omeprazole 20 MG capsule   Commonly known as: PRILOSEC   Take 20 mg by mouth daily.      potassium chloride SA 20 MEQ tablet   Commonly known as: K-DUR,KLOR-CON   Take 2 tablets (40 mEq total) by mouth daily. For 5 days then stop.      sitaGLIPtan-metformin 50-500 MG per tablet   Commonly known as: JANUMET   Take 1 tablet by mouth 2 (two) times daily with a meal.      traMADol 50 MG tablet   Commonly known as: ULTRAM   Take 1 tablet (50 mg total) by mouth every 6 (six) hours as needed.      warfarin 2 MG tablet   Commonly known as: COUMADIN   Take 1 tablet (2 mg total) by mouth daily at 6 PM. Or as directed by Dr. Vern Claude office.          Please note that she is not on an ACE as her blood pressure would not tolerate it at this time. Will start As an outpatient.   Discharge Orders    Future Appointments: Provider: Department: Dept Phone: Center:   05/10/2012 3:00 PM Kerin Perna, MD [facility] [phone] [facility]      Follow-up Information    Follow up with VAN Dinah Beers, MD on 05/10/2012. (Appointment is at 3:00pm)    Contact information:   [address] [place] [address] [phone]       Follow up with [facility]. (Please get chest xray performed at  2:00pm)    Contact information:   [address], [address]      Follow up with Isabella Stalling, MD. Schedule an appointment as soon as possible for a visit in 2 weeks.   Contact information:   [address] [place] [address] [phone]       Follow up with Valera Castle MD. Schedule an appointment as soon as possible for a visit in 2 weeks.   Contact information:   [address] [place], [place] [address] [phone]         Signed: Lowella Dandy 04/14/2012, 1:41 PM

## 2012-04-14 NOTE — Progress Notes (Signed)
CARDIAC REHAB PHASE I   PRE:  Rate/Rhythm: 93AFIB  BP:  Supine:   Sitting: 109/59  Standing:    SaO2: 92-92% 1.5L  MODE:  Ambulation: 550 ft   POST:  Rate/Rhythem: 112 AFIB some wide beats with activity  BP:  Supine:   Sitting: 127/64  Standing:    SaO2: 95%2L hall and after walk 1340-1415 Pt walked 550 ft on 2L with rolling walker and asst x 1. Stopped three times to rest. Sats good on 2L oxygen. To recliner after walk. Put shoes on before walk. Pt's feet swollen which she said makes it harder to walk.  Duanne Limerick

## 2012-04-14 NOTE — Progress Notes (Signed)
Inpatient Diabetes Program Recommendations  AACE/ADA: New Consensus Statement on Inpatient Glycemic Control (2009)  Target Ranges:  Prepandial:   less than 140 mg/dL      Peak postprandial:   less than 180 mg/dL (1-2 hours)      Critically ill patients:  140 - 180 mg/dL   Reason for Visit: Results for [name], [name] (MRN [id number]) as of 04/14/2012 11:31  Ref. Range 04/13/2012 20:44 04/13/2012 23:55 04/14/2012 04:21 04/14/2012 06:05 04/14/2012 08:28  Glucose-Capillary Latest Range: 70-99 mg/dL 782 (H) 956 (H) 84  213 (H)   Please change Novolog correction schedule to tid ac and HS (use bedtime scale).

## 2012-04-14 NOTE — Discharge Instructions (Signed)
Coronary Artery Bypass Grafting Care After Refer to this sheet in the next few weeks. These instructions provide you with information on caring for yourself after your procedure. Your caregiver may also give you more specific instructions. Your treatment has been planned according to current medical practices, but problems sometimes occur. Call your caregiver if you have any problems or questions after your procedure.  Recovery from open heart surgery will be different for everyone. Some people feel well after 3 or 4 weeks, while for others it takes longer. After heart surgery, it may be normal to:  Not have an appetite, feel nauseated by the smell of food, or only want to eat a small amount.   Be constipated because of changes in your diet, activity, and medicines. Eat foods high in fiber. Add fresh fruits and vegetables to your diet. Stool softeners may be helpful.   Feel sad or unhappy. You may be frustrated or cranky. You may have good days and bad days. Do not give up. Talk to your caregiver if you do not feel better.   Feel weakness and fatigue. You many need physical therapy or cardiac rehabilitation to get your strength back.   Develop an irregular heartbeat called atrial fibrillation. Symptoms of atrial fibrillation are a fast, irregular heartbeat or feelings of fluttery heartbeats, shortness of breath, low blood pressure, and dizziness. If these symptoms develop, see your caregiver right away.  MEDICATION  Have a list of all the medicines you will be taking when you leave the hospital. For every medicine, know the following:   Name.   Exact dose.   Time of day to be taken.   How often it should be taken.   Why you are taking it.   Ask which medicines should or should not be taken together. If you take more than one heart medicine, ask if it is okay to take them together. Some heart medicines should not be taken at the same time because they may lower your blood pressure too  much.   Narcotic pain medicine can cause constipation. Eat fresh fruits and vegetables. Add fiber to your diet. Stool softener medicine may help relieve constipation.   Keep a copy of your medicines with you at all times.   Do not add or stop taking any medicine until you check with your caregiver.   Medicines can have side effects. Call your caregiver who prescribed the medicine if you:   Start throwing up, have diarrhea, or have stomach pain.   Feel dizzy or lightheaded when you stand up.   Feel your heart is skipping beats or is beating too fast or too slow.   Develop a rash.   Notice unusual bruising or bleeding.  HOME CARE INSTRUCTIONS  After heart surgery, it is important to learn how to take your pulse. Have your caregiver show you how to take your pulse.   Use your incentive spirometer. Ask your caregiver how long after surgery you need to use it.  Care of your chest incision  Tell your caregiver right away if you notice clicking in your chest (sternum).   Support your chest with a pillow or your arms when you take deep breaths and cough.   Follow your caregiver's instructions about when you can bathe or swim.   Protect your incision from sunlight during the first year to keep the scar from getting dark.   Tell your caregiver if you notice:   Increased tenderness of your incision.   Increased redness   or swelling around your incision.   Drainage or pus from your incision.  Care of your leg incision(s)  Avoid crossing your legs.   Avoid sitting for long periods of time. Change positions every half hour.   Elevate your leg(s) when you are sitting.   Check your leg(s) daily for swelling. Check the incisions for redness or drainage.   Wear your elastic stockings as told by your caregiver. Take them off at bedtime.  Diet  Diet is very important to heart health.   Eat plenty of fresh fruits and vegetables. Meats should be lean cut. Avoid canned, processed, and  fried foods.   Talk to a dietician. They can teach you how to make healthy food and drink choices.  Weight  Weigh yourself every day. This is important because it helps to know if you are retaining fluid that may make your heart and lungs work harder.   Use the same scale each time.   Weigh yourself every morning at the same time. You should do this after you go to the bathroom, but before you eat breakfast.   Your weight will be more accurate if you do not wear any clothes.   Record your weight.   Tell your caregiver if you have gained 2 pounds or more overnight.  Activity Stop any activity at once if you have chest pain, shortness of breath, irregular heartbeats, or dizziness. Get help right away if you have any of these symptoms.  Bathing.  Avoid soaking in a bath or hot tub until your incisions are healed.   Rest. You need a balance of rest and activity.   Exercise. Exercise per your caregiver's advice. You may need physical therapy or cardiac rehabilitation to help strengthen your muscles and build your endurance.   Climbing stairs. Unless your caregiver tells you not to climb stairs, go up stairs slowly and rest if you tire. Do not pull yourself up by the handrail.   Driving a car. Follow your caregiver's advice on when you may drive. You may ride as a passenger at any time. When traveling for long periods of time in a car, get out of the car and walk around for a few minutes every 2 hours.   Lifting. Avoid lifting, pushing, or pulling anything heavier than 10 pounds for 6 weeks after surgery or as told by your caregiver.   Returning to work. Check with your caregiver. People heal at different rates. Most people will be able to go back to work 6 to 12 weeks after surgery.   Sexual activity. You may resume sexual relations as told by your caregiver.  SEEK MEDICAL CARE IF:  Any of your incisions are red, painful, or have any type of drainage coming from them.   You have an  oral temperature above 102 F (38.9 C).   You have ankle or leg swelling.   You have pain in your legs.   You have weight gain of 2 or more pounds a day.   You feel dizzy or lightheaded when you stand up.  SEEK IMMEDIATE MEDICAL CARE IF:  You have angina or chest pain that goes to your jaw or arms. Call your local emergency services right away.   You have shortness of breath at rest or with activity.   You have a fast or irregular heartbeat (arrhythmia).   There is a "clicking" in your sternum when you move.   You have numbness or weakness in your arms or legs.    MAKE SURE YOU:  Understand these instructions.   Will watch your condition.   Will get help right away if you are not doing well or get worse.  Document Released: 07/02/2005 Document Revised: 12/02/2011 Document Reviewed: 02/17/2011 Exit[employer] Patient Information 2012 [employer].  Endoscopic Saphenous Vein Harvesting Care After Refer to this sheet in the next few weeks. These instructions provide you with information on caring for yourself after your procedure. Your caregiver may also give you more specific instructions. Your treatment has been planned according to current medical practices, but problems sometimes occur. Call your caregiver if you have any problems or questions after your procedure. HOME CARE INSTRUCTIONS Medicine  Take whatever pain medicine your surgeon prescribes. Follow the directions carefully. Do not take over-the-counter pain medicine unless your surgeon says it is okay. Some pain medicine can cause bleeding problems for several weeks after surgery.   Follow your surgeon's instructions about driving. You will probably not be permitted to drive after heart surgery.   Take any medicines your surgeon prescribes. Any medicines you took before your heart surgery should be checked with your caregiver before you start taking them again.  Wound care  Ask your surgeon how long you should keep  wearing your elastic bandage or stocking.   Check the area around your surgical cuts (incisions) whenever your bandages (dressings) are changed. Look for any redness or swelling.   You will need to return to have the stitches (sutures) or staples taken out. Ask your surgeon when to do that.   Ask your surgeon when you can shower or bathe.  Activity  Try to keep your legs raised when you are sitting.   Do any exercises your caregivers have given you. These may include deep breathing exercises, coughing, walking, or other exercises.  SEEK MEDICAL CARE IF:  You have any questions about your medicines.   You have more leg pain, especially if your pain medicine stops working.   New or growing bruises develop on your leg.   Your leg swells, feels tight, or becomes red.   You have numbness in your leg.  SEEK IMMEDIATE MEDICAL CARE IF:  Your pain gets much worse.   Blood or fluid leaks from any of the incisions.   Your incisions become warm, swollen, or red.   You have chest pain.   You have trouble breathing.   You have a fever.   You have more pain near your leg incision.  MAKE SURE YOU:  Understand these instructions.   Will watch your condition.   Will get help right away if you are not doing well or get worse.  Document Released: 08/25/2011 Document Revised: 12/02/2011 Document Reviewed: 08/25/2011 ExitCare Patient Information 2012 [employer]. 

## 2012-04-14 NOTE — Progress Notes (Addendum)
11 Days Post-Op Procedure(s) (LRB): CORONARY ARTERY BYPASS GRAFTING (CABG) (N/A) MITRAL VALVE REPAIR (MVR) (N/A)  Subjective: Patient feeling well.She is eating breakfast this am. Loose stools are decreasing.  Objective: Vital signs in last 24 hours: Patient Vitals for the past 24 hrs:  BP Temp Temp src Pulse Resp SpO2 Weight  04/14/12 0420 126/82 mmHg 98.4 F (36.9 C) Oral 74  18  95 % -  04/14/12 0209 - - - - - - 167 lb (75.751 kg)  04/13/12 2046 102/52 mmHg 97.4 F (36.3 C) Oral 66  18  95 % -  04/13/12 1400 102/57 mmHg 97.6 F (36.4 C) Oral 72  18  97 % -   Pre op weight  72 kg Current Weight  04/14/12 167 lb (75.751 kg)      Intake/Output from previous day: 04/18 0701 - 04/19 0700 In: 720 [P.O.:720] Out: 800 [Urine:800]   Physical Exam:  Cardiovascular: IRRR, IRRR; no murmurs. Pulmonary: Diminished at bases; no rales, wheezes, or rhonchi. Abdomen: Soft, non tender, bowel sounds present. Extremities: Mild bilateral lower extremity edema. Wounds: Clean and dry.  No erythema or signs of infection.  Lab Results: CBC:  Basename 04/12/12 0509  WBC 12.2*  HGB 9.1*  HCT 28.4*  PLT 413*   BMET:   Basename 04/12/12 0509  NA 134*  K 3.5  CL 96  CO2 27  GLUCOSE 129*  BUN 21  CREATININE 0.71  CALCIUM 8.6    PT/INR:   Basename 04/14/12 0605  LABPROT 25.9*  INR 2.32*   ABG:  INR: Will add last result for INR, ABG once components are confirmed Will add last 4 CBG results once components are confirmed  Assessment/Plan:  1. CV - Post Op afib with CVR.Had a 2.2 second pause yesterday afternoon.On Coreg 6.25 bid, Cardizem 120 bid, and Coumadin.Will give 1 mg of Coumadin tonight as INR again increased. 2.  Pulmonary - Encourage incentive spirometer. 3. Volume Overload - Continue with diuresis. 4.  Acute blood loss anemia - Last H/H 9.1/28.4. 5.DM-CBGs 207/140/84.Pre op HGA1C 6.9 pre op. Continue Janumet at a reduced dose. 6.Remove EPW  am.    ZIMMERMAN,DONIELLE MPA-C 04/14/2012    I have seen and examined the patient and agree with the assessment and plan as outlined.  [name] H 04/14/2012 2:30 PM

## 2012-04-15 LAB — GLUCOSE, CAPILLARY
Glucose-Capillary: 109 mg/dL — ABNORMAL HIGH (ref 70–99)
Glucose-Capillary: 170 mg/dL — ABNORMAL HIGH (ref 70–99)
Glucose-Capillary: 199 mg/dL — ABNORMAL HIGH (ref 70–99)
Glucose-Capillary: 97 mg/dL (ref 70–99)

## 2012-04-15 LAB — PROTIME-INR: INR: 2.45 — ABNORMAL HIGH (ref 0.00–1.49)

## 2012-04-15 NOTE — Progress Notes (Addendum)
CARDIAC REHAB PHASE I   PRE:  Rate/Rhythm: 72 AFIB with PVC  BP:  Supine:   Sitting: 102/54  Standing:    SaO2:93 2L  MODE:  Ambulation: 275 ft   POST:  Rate/Rhythem: 71 AFIB with PVC  BP:  Supine:   Sitting: 120/55  Standing:    SaO2: 93 2L 1335 - 1440  Pt ambulated with RW and 2 on 2L 02. Tolerated well. Back to chair with feet up and call bell in reach. Enc use of FV and IS.  Rosalie Doctor

## 2012-04-15 NOTE — Progress Notes (Addendum)
12 Days Post-Op Procedure(s) (LRB): CORONARY ARTERY BYPASS GRAFTING (CABG) (N/A) MITRAL VALVE REPAIR (MVR) (N/A)  Subjective: Patient about to go for a walk. Getting a little stronger everyday.  Objective: Vital signs in last 24 hours: Patient Vitals for the past 24 hrs:  BP Temp Temp src Pulse Resp SpO2 Weight  04/15/12 0500 136/55 mmHg 97.5 F (36.4 C) Oral 84  20  97 % 165 lb 2 oz (74.9 kg)  04/14/12 2100 128/76 mmHg 97.9 F (36.6 C) Oral 72  20  96 % -  04/14/12 1356 111/70 mmHg 98.2 F (36.8 C) Oral 62  18  96 % -  04/14/12 1003 - - - 106  - 88 % -  04/14/12 0955 - - - 82  - 95 % -   Pre op weight  72 kg Current Weight  04/15/12 165 lb 2 oz (74.9 kg)      Intake/Output from previous day: 04/19 0701 - 04/20 0700 In: -  Out: 900 [Urine:900]   Physical Exam:  Cardiovascular: IRRR, IRRR; no murmurs. Pulmonary: Diminished at bases; no rales, wheezes, or rhonchi. Abdomen: Soft, non tender, bowel sounds present. Extremities: Mild bilateral lower extremity edema. Wounds: Clean and dry.  No erythema or signs of infection.  Lab Results: CBC: No results found for this basename: WBC:2,HGB:2,HCT:2,PLT:2 in the last 72 hours BMET:  No results found for this basename: NA:2,K:2,CL:2,CO2:2,GLUCOSE:2,BUN:2,CREATININE:2,CALCIUM:2 in the last 72 hours  PT/INR:   Basename 04/15/12 0538  LABPROT 27.0*  INR 2.45*   ABG:  INR: Will add last result for INR, ABG once components are confirmed Will add last 4 CBG results once components are confirmed  Assessment/Plan:  1. CV - Post Op afib with CVR.Had a 2.2 second pause 4/18.Has also had several beats of NSVT. SR and PVCs this amOn Coreg 6.25 bid, Cardizem 120 bid, and Coumadin.Continue  1 mg of Coumadin tonight as INR again increased. 2.  Pulmonary - Encourage incentive spirometer. 3. Volume Overload - Continue with diuresis. 4.  Acute blood loss anemia - Last H/H 9.1/28.4. 5.DM-CBGs 132/170/109 .Pre op HGA1C 6.9 pre  op. Continue Janumet at a reduced dose. 6.Remove EPW . 7.Loose stools-Lomotil PRN. If continues, will check C Dif (not on abx).     Ardelle Balls PA-C 04/15/2012 8:57 AM   I have seen and examined the patient and agree with the assessment and plan as outlined.  Tentatively for d/c Monday.  Kareena Arrambide H 04/15/2012 12:48 PM

## 2012-04-15 NOTE — Progress Notes (Signed)
I spoke with Randall Hiss, PA about removing EPW's due to frequent PVC's and Donnielle advised to take wires out in the am. Stormy Card

## 2012-04-15 NOTE — Progress Notes (Signed)
Patient ambulated 300 ft front rolling walker and using 2L O2.  Patient tolerated well and sats 97 percent on return. RN will continue to monitor patient. Lodema [facility]

## 2012-04-15 NOTE — Progress Notes (Signed)
Patient ambulated with rolling walker on 2L of O2.. Pt tolerated well. Back to bed with call bel in reach.  Lodema [facility]

## 2012-04-16 LAB — COMPREHENSIVE METABOLIC PANEL
Albumin: 2.7 g/dL — ABNORMAL LOW (ref 3.5–5.2)
BUN: 16 mg/dL (ref 6–23)
Calcium: 8.9 mg/dL (ref 8.4–10.5)
Creatinine, Ser: 0.7 mg/dL (ref 0.50–1.10)
GFR calc Af Amer: >90 mL/min (ref >90)
Glucose, Bld: 136 mg/dL — ABNORMAL HIGH (ref 70–99)
Potassium: 4.3 mEq/L (ref 3.5–5.1)
Total Protein: 5.7 g/dL — ABNORMAL LOW (ref 6.0–8.3)

## 2012-04-16 LAB — CBC
HCT: 28.1 % — ABNORMAL LOW (ref 36.0–46.0)
MCH: 26.9 pg (ref 26.0–34.0)
MCHC: 31.7 g/dL (ref 30.0–36.0)
RDW: 18.6 % — ABNORMAL HIGH (ref 11.5–15.5)

## 2012-04-16 LAB — GLUCOSE, CAPILLARY
Glucose-Capillary: 135 mg/dL — ABNORMAL HIGH (ref 70–99)
Glucose-Capillary: 236 mg/dL — ABNORMAL HIGH (ref 70–99)
Glucose-Capillary: 142 mg/dL — ABNORMAL HIGH (ref 70–99)

## 2012-04-16 LAB — PROTIME-INR: INR: 2.09 — ABNORMAL HIGH (ref 0.00–1.49)

## 2012-04-16 MED ORDER — ASPIRIN 81 MG PO TBEC: 81.0000 mg | DELAYED_RELEASE_TABLET | Freq: Every day | ORAL | Status: DC

## 2012-04-16 MED ORDER — FUROSEMIDE 40 MG PO TABS: 40.0000 mg | ORAL_TABLET | Freq: Every day | ORAL | Status: DC

## 2012-04-16 MED ORDER — WARFARIN SODIUM 2 MG PO TABS: 2.0000 mg | ORAL_TABLET | Freq: Every day | ORAL | Status: DC

## 2012-04-16 MED ORDER — POTASSIUM CHLORIDE CRYS ER 20 MEQ PO TBCR: 40.0000 meq | EXTENDED_RELEASE_TABLET | Freq: Every day | ORAL | Status: DC

## 2012-04-16 MED ORDER — INSULIN ASPART 100 UNIT/ML ~~LOC~~ SOLN
0.0000 [IU] | Freq: Three times a day (TID) | SUBCUTANEOUS | Status: DC
  Administered 2012-04-16 (×2): 2 [IU] via SUBCUTANEOUS
  Administered 2012-04-16: 8 [IU] via SUBCUTANEOUS
  Administered 2012-04-17 (×2): 2 [IU] via SUBCUTANEOUS

## 2012-04-16 MED ORDER — POLYSACCHARIDE IRON COMPLEX 150 MG PO CAPS: 150.0000 mg | ORAL_CAPSULE | Freq: Every day | ORAL | Status: DC

## 2012-04-16 MED ORDER — INSULIN ASPART 100 UNIT/ML ~~LOC~~ SOLN: 0.0000 [IU] | SUBCUTANEOUS | Status: DC

## 2012-04-16 MED ORDER — POTASSIUM CHLORIDE CRYS ER 20 MEQ PO TBCR: 20.0000 meq | EXTENDED_RELEASE_TABLET | Freq: Every day | ORAL | Status: DC

## 2012-04-16 MED ORDER — POLYSACCHARIDE IRON COMPLEX 150 MG PO CAPS
150.0000 mg | ORAL_CAPSULE | Freq: Every day | ORAL | Status: DC
  Administered 2012-04-16 – 2012-04-17 (×2): 150 mg via ORAL
  Filled 2012-04-16 (×2): qty 1

## 2012-04-16 MED ORDER — WARFARIN SODIUM 1 MG PO TABS: 1.0000 mg | ORAL_TABLET | Freq: Every day | ORAL | Status: DC

## 2012-04-16 MED ORDER — WARFARIN SODIUM 2 MG PO TABS
2.0000 mg | ORAL_TABLET | Freq: Every day | ORAL | Status: DC
  Administered 2012-04-16: 2 mg via ORAL
  Filled 2012-04-16 (×2): qty 1

## 2012-04-16 NOTE — Progress Notes (Signed)
Removed epicardial wires. Three intact.  Pt tolerated procedure well.  Bedrest x one hour. No s/sx bleeding or ecoptic beats.  Will continue to monitor.  Call bell within reach. [name]

## 2012-04-16 NOTE — Progress Notes (Signed)
Had patient sit and dangle, then stand at bedside, but did not ambulate this shift because the patient was unsteady, and reported weakness and feeling "like her foot was balled up." Repositioned patient in bed. Will continue to monitor.

## 2012-04-16 NOTE — Progress Notes (Addendum)
13 Days Post-Op Procedure(s) (LRB): CORONARY ARTERY BYPASS GRAFTING (CABG) (N/A) MITRAL VALVE REPAIR (MVR) (N/A)  Subjective: Patient with loose stools, but slightly less than previously C Dif negatvie on 4/13).  Objective: Vital signs in last 24 hours: Patient Vitals for the past 24 hrs:  BP Temp Temp src Pulse Resp SpO2 Weight  04/16/12 0527 123/60 mmHg 97 F (36.1 C) Oral 79  18  95 % -  04/16/12 0443 - - - - - - 162 lb 4.8 oz (73.619 kg)  04/15/12 1958 126/56 mmHg 98.2 F (36.8 C) Oral 82  19  96 % -  04/15/12 1421 102/53 mmHg 98 F (36.7 C) Oral 70  18  95 % -   Pre op weight  72 kg Current Weight  04/16/12 162 lb 4.8 oz (73.619 kg)      Intake/Output from previous day: 04/20 0701 - 04/21 0700 In: 480 [P.O.:480] Out: 1450 [Urine:1450]   Physical Exam:  Cardiovascular: IRRR, IRRR; no murmurs. Pulmonary: Diminished at bases; no rales, wheezes, or rhonchi. Abdomen: Soft, non tender, bowel sounds present. Extremities: Mild bilateral lower extremity edema. Wounds: Clean and dry.  No erythema or signs of infection.  Lab Results: CBC:  Basename 04/16/12 0500  WBC 9.9  HGB 8.9*  HCT 28.1*  PLT 464*   BMET:   Basename 04/16/12 0500  NA 137  K 4.3  CL 97  CO2 32  GLUCOSE 136*  BUN 16  CREATININE 0.70  CALCIUM 8.9    PT/INR:   Basename 04/16/12 0500  LABPROT 23.8*  INR 2.09*   ABG:  INR: Will add last result for INR, ABG once components are confirmed Will add last 4 CBG results once components are confirmed  Assessment/Plan:  1. CV - Post Op afib with CVR.Had a 2.2 second pause 4/18.Has also had several beats of afib with PVCs (likely not NSVT).  On Coreg 6.25 bid, Cardizem 120 bid, and Coumadin.Continue  2 mg of Coumadin. 2.  Pulmonary - Encourage incentive spirometer.Wean O2 as tolerates. 3. Volume Overload - Continue with diuresis. 4.  Acute blood loss anemia - Last H/H 8.9/28.1.Start Nu Iron. 5.DM-CBGs 97/145/135 .Pre op HGA1C 6.9 pre  op. Continue Janumet at a reduced dose. 6.Remove EPW. 7.Loose stools-Lomotil PRN. 8.Possible to SNF in am.May need O2.     Ardelle Balls PA-C 04/16/2012 8:23 AM   I have seen and examined the patient and agree with the assessment and plan as outlined.  Tentatively for d/c Monday.  Rhythm stable and therapeutic on coumadin.  Increase coumadin to 2 mg daily.    Akosua Constantine H 04/16/2012 11:38 AM

## 2012-04-16 NOTE — Progress Notes (Signed)
Pt ambulated with rolling walker on RA  300 ft and tolerated activity well.  O2 Sats see doc flowsheet. Will continue to monitor pt.

## 2012-04-17 ENCOUNTER — Encounter: Payer: Self-pay | Admitting: Cardiothoracic Surgery

## 2012-04-17 ENCOUNTER — Inpatient Hospital Stay
Admission: RE | Admit: 2012-04-17 | Discharge: 2012-05-11 | Disposition: A | Discharge status: Home / Self Care | Payer: Private Health Insurance - Indemnity | Source: Ambulatory Visit | Attending: Internal Medicine | Admitting: Internal Medicine

## 2012-04-17 DIAGNOSIS — I509 Heart failure, unspecified: Secondary | ICD-10-CM

## 2012-04-17 DIAGNOSIS — K222 Esophageal obstruction: Secondary | ICD-10-CM

## 2012-04-17 DIAGNOSIS — T148XXA Other injury of unspecified body region, initial encounter: Principal | ICD-10-CM

## 2012-04-17 LAB — GLUCOSE, CAPILLARY
Glucose-Capillary: 128 mg/dL — ABNORMAL HIGH (ref 70–99)
Glucose-Capillary: 155 mg/dL — ABNORMAL HIGH (ref 70–99)

## 2012-04-17 NOTE — Progress Notes (Signed)
Clinical Social Work Department CLINICAL SOCIAL WORK PLACEMENT NOTE 04/17/2012  Patient:  [name], [name]  Account Number:  [id number] Admit date:  03/26/2012  Clinical Social Worker:  Baxter Flattery, LCSWA  Date/time:  04/12/2012 04:00 PM  Clinical Social Work is seeking post-discharge placement for this patient at the following level of care:   SKILLED NURSING   (*CSW will update this form in Epic as items are completed)   04/11/2012  Patient/family provided with [facility] Department of Clinical Social Work's list of facilities offering this level of care within the geographic area requested by the patient (or if unable, by the patient's family).  04/11/2012  Patient/family informed of their freedom to choose among providers that offer the needed level of care, that participate in Medicare, Medicaid or managed care program needed by the patient, have an available bed and are willing to accept the patient.  04/11/2012  Patient/family informed of [facility]' ownership interest in [facility], as well as of the fact that they are under no obligation to receive care at this facility.  PASARR submitted to EDS on 04/11/2012 PASARR number received from EDS on 04/12/2012  FL2 transmitted to all facilities in geographic area requested by pt/family on  04/11/2012 FL2 transmitted to all facilities within larger geographic area on   Patient informed that his/her managed care company has contracts with or will negotiate with  certain facilities, including the following:   [facility]     Patient/family informed of bed offers received:  04/17/2012 Patient chooses bed at [facility] Physician recommends and patient chooses bed at    Patient to be transferred to [facility] on  04/17/2012 Patient to be transferred to facility by ptar  The following physician request were entered in Epic:   Additional Comments: Family only interested in [facility]

## 2012-04-17 NOTE — Progress Notes (Signed)
Pt discharge instructions and patient education complete. PICC line d/c. No further questions. No s/s of distress. D/C via ambulance to rehab. Dion Saucier

## 2012-04-17 NOTE — Progress Notes (Signed)
CARDIAC REHAB PHASE I   PRE:  Rate/Rhythm: 90 Afib    BP: sitting 100/60    SaO2: 95 RA  MODE:  Ambulation: 550 ft   POST:  Rate/Rhythm: 112    BP: sitting 120/60     SaO2: 93 RA  Pt tolerated well with RW and assist x1, with several rest stops due to SOB. Had been walking shorter distances over weekend. Encouraged to keep increasing distance at [facility].  [id number]  Harriet Masson CES, ACSM

## 2012-04-17 NOTE — Discharge Summary (Signed)
patient examined and medical record reviewed,agree with above note. [name],[name] 04/17/2012    

## 2012-04-17 NOTE — Progress Notes (Signed)
Pt with bed available at [facility] today. CSW will prepare packet and confirm plan with pt and pt family. Plan for d/c after lunch. Will transport by [facility].   Baxter Flattery, MSW [phone]

## 2012-04-17 NOTE — Progress Notes (Signed)
   CARE MANAGEMENT NOTE 04/17/2012  Patient:  [name], [name]   Account Number:  [id number]  Date Initiated:  04/05/2012  Documentation initiated by:  Ronny Flurry  Subjective/Objective Assessment:   S/p CABG on 04-03-12     Action/Plan:   PTA, PT INDEPENDENT, LIVES WITH SPOUSE.  SOMEWHAT SLOW TO PROGRESS; MAY NEED SNF AT DISCHARGE FOR REHAB.  WILL HAVE CSW FOLLOW UP WITH PT.   Anticipated DC Date:  04/10/2012   Anticipated DC Plan:  SKILLED NURSING FACILITY  In-house referral  Clinical Social Worker         Choice offered to / List presented to:             Status of service:  Completed, signed off Medicare Important Message given?   (If response is "NO", the following Medicare IM given date fields will be blank) Date Medicare IM given:   Date Additional Medicare IM given:    Discharge Disposition:  SKILLED NURSING FACILITY  Per UR Regulation:  Reviewed for med. necessity/level of care/duration of stay  If discussed at Long Length of Stay Meetings, dates discussed:    Comments:  04/17/12 Chosen Garron,RN,BSN [phone] PT DISCHARGED TO SNF TODAY, PER CSW ARRANGEMENTS.

## 2012-04-17 NOTE — Progress Notes (Addendum)
 14 Days Post-Op Procedure(s) (LRB): CORONARY ARTERY BYPASS GRAFTING (CABG) (N/A) MITRAL VALVE REPAIR (MVR) (N/A) Subjective:  [name] complains of some back discomfort this morning.  Otherwise she states she is doing well.  She is no longer on oxygen and is tolerating well.   Objective: Vital signs in last 24 hours: Temp:  [97.5 F (36.4 C)-98.1 F (36.7 C)] 97.5 F (36.4 C) (04/22 0459) Pulse Rate:  [76-93] 76  (04/22 0459) Cardiac Rhythm:  [-] Atrial fibrillation (04/21 1046) Resp:  [18] 18  (04/22 0459) BP: (102-129)/(55-75) 117/66 mmHg (04/22 0459) SpO2:  [92 %-98 %] 95 % (04/22 0459) Weight:  [162 lb 11.2 oz (73.8 kg)] 162 lb 11.2 oz (73.8 kg) (04/22 0400)   Intake/Output from previous day: 04/21 0701 - 04/22 0700 In: 840 [P.O.:840] Out: 800 [Urine:800]  General appearance: alert, cooperative and no distress Neurologic: intact Heart:irregularly irregular Lungs: clear to auscultation bilaterally Abdomen: soft, non-tender; bowel sounds normal; no masses,  no organomegaly Extremities: edema 2+ Wound: clean and dry  Lab Results:  Basename 04/16/12 0500  WBC 9.9  HGB 8.9*  HCT 28.1*  PLT 464*   BMET:  Basename 04/16/12 0500  NA 137  K 4.3  CL 97  CO2 32  GLUCOSE 136*  BUN 16  CREATININE 0.70  CALCIUM 8.9    PT/INR:  Basename 04/17/12 0400  LABPROT 22.4*  INR 1.92*   ABG    Component Value Date/Time   PHART 7.311* 04/04/2012 0439   HCO3 21.5 04/04/2012 0439   TCO2 22 04/04/2012 1717   ACIDBASEDEF 5.0* 04/04/2012 0439   O2SAT 96.0 04/04/2012 0439   CBG (last 3)   Basename 04/17/12 0603 04/16/12 2047 04/16/12 1611  GLUCAP 128* 131* 142*    Assessment/Plan: S/P Procedure(s) (LRB): CORONARY ARTERY BYPASS GRAFTING (CABG) (N/A) MITRAL VALVE REPAIR (MVR) (N/A)  2. Post op A. Fib with CVR- will continue Coumadin, Coreg, and cardizem 3. INR 1.92, coumadin 2mg  daily 4. DM- CBGs controlled 5. Diarrhea- improving, C. Diff negative, will continue Lomtil  prn 6. Fluid Balance-  Patients weight up 3 kg- will continue Lasix 7. Deconditioning- will need SNF 8. Dispo- patient is stable for d/c, awaiting approval for SNF bed     LOS: 22 days    BARRETT, ERIN 04/17/2012  I have seen and examined the patient and agree with the assessment and plan as outlined.  , H 04/17/2012 1:28 PM

## 2012-04-19 ENCOUNTER — Ambulatory Visit (HOSPITAL_COMMUNITY)
Admit: 2012-04-19 | Discharge: 2012-04-19 | Disposition: A | Discharge status: Home / Self Care | Payer: Medicare Other | Attending: Internal Medicine | Admitting: Internal Medicine

## 2012-04-19 DIAGNOSIS — J90 Pleural effusion, not elsewhere classified: Secondary | ICD-10-CM | POA: Insufficient documentation

## 2012-04-19 DIAGNOSIS — S2249XA Multiple fractures of ribs, unspecified side, initial encounter for closed fracture: Secondary | ICD-10-CM | POA: Insufficient documentation

## 2012-04-19 DIAGNOSIS — I517 Cardiomegaly: Secondary | ICD-10-CM | POA: Insufficient documentation

## 2012-04-19 DIAGNOSIS — Y838 Other surgical procedures as the cause of abnormal reaction of the patient, or of later complication, without mention of misadventure at the time of the procedure: Secondary | ICD-10-CM | POA: Insufficient documentation

## 2012-04-19 DIAGNOSIS — Z9089 Acquired absence of other organs: Secondary | ICD-10-CM | POA: Insufficient documentation

## 2012-04-19 DIAGNOSIS — R079 Chest pain, unspecified: Secondary | ICD-10-CM | POA: Insufficient documentation

## 2012-04-19 LAB — GLUCOSE, CAPILLARY: Glucose-Capillary: 117 mg/dL — ABNORMAL HIGH (ref 70–99)

## 2012-04-20 LAB — GLUCOSE, CAPILLARY
Glucose-Capillary: 135 mg/dL — ABNORMAL HIGH (ref 70–99)
Glucose-Capillary: 121 mg/dL — ABNORMAL HIGH (ref 70–99)

## 2012-04-21 LAB — GLUCOSE, CAPILLARY

## 2012-04-22 LAB — GLUCOSE, CAPILLARY

## 2012-04-23 LAB — GLUCOSE, CAPILLARY: Glucose-Capillary: 122 mg/dL — ABNORMAL HIGH (ref 70–99)

## 2012-04-24 LAB — GLUCOSE, CAPILLARY: Glucose-Capillary: 119 mg/dL — ABNORMAL HIGH (ref 70–99)

## 2012-04-25 LAB — GLUCOSE, CAPILLARY: Glucose-Capillary: 154 mg/dL — ABNORMAL HIGH (ref 70–99)

## 2012-04-26 LAB — GLUCOSE, CAPILLARY
Glucose-Capillary: 124 mg/dL — ABNORMAL HIGH (ref 70–99)
Glucose-Capillary: 123 mg/dL — ABNORMAL HIGH (ref 70–99)

## 2012-04-27 ENCOUNTER — Ambulatory Visit: Payer: Medicare Other | Admitting: Urgent Care

## 2012-04-27 ENCOUNTER — Ambulatory Visit (HOSPITAL_COMMUNITY)
Admit: 2012-04-27 | Discharge: 2012-04-27 | Disposition: A | Discharge status: Home / Self Care | Payer: Medicare Other | Attending: Internal Medicine | Admitting: Internal Medicine

## 2012-04-27 DIAGNOSIS — K222 Esophageal obstruction: Secondary | ICD-10-CM | POA: Insufficient documentation

## 2012-04-28 ENCOUNTER — Encounter: Payer: Self-pay | Admitting: *Deleted

## 2012-04-28 LAB — GLUCOSE, CAPILLARY: Glucose-Capillary: 166 mg/dL — ABNORMAL HIGH (ref 70–99)

## 2012-04-29 ENCOUNTER — Inpatient Hospital Stay (HOSPITAL_COMMUNITY): Payer: Medicare Other | Attending: Internal Medicine

## 2012-04-29 LAB — GLUCOSE, CAPILLARY: Glucose-Capillary: 138 mg/dL — ABNORMAL HIGH (ref 70–99)

## 2012-04-30 LAB — GLUCOSE, CAPILLARY
Glucose-Capillary: 133 mg/dL — ABNORMAL HIGH (ref 70–99)
Glucose-Capillary: 149 mg/dL — ABNORMAL HIGH (ref 70–99)

## 2012-05-02 ENCOUNTER — Ambulatory Visit (INDEPENDENT_AMBULATORY_CARE_PROVIDER_SITE_OTHER): Payer: Medicare Other | Admitting: Adult Health

## 2012-05-02 ENCOUNTER — Encounter: Payer: Self-pay | Admitting: Adult Health

## 2012-05-02 ENCOUNTER — Ambulatory Visit (HOSPITAL_COMMUNITY): Payer: Medicare Other | Attending: Adult Health

## 2012-05-02 VITALS — BP 93/58 | HR 74 | Resp 16 | Ht 68.0 in | Wt 154.0 lb

## 2012-05-02 DIAGNOSIS — Z9889 Other specified postprocedural states: Secondary | ICD-10-CM

## 2012-05-02 DIAGNOSIS — I4891 Unspecified atrial fibrillation: Secondary | ICD-10-CM

## 2012-05-02 DIAGNOSIS — I959 Hypotension, unspecified: Secondary | ICD-10-CM

## 2012-05-02 DIAGNOSIS — I5023 Acute on chronic systolic (congestive) heart failure: Secondary | ICD-10-CM

## 2012-05-02 DIAGNOSIS — R0600 Dyspnea, unspecified: Secondary | ICD-10-CM

## 2012-05-02 DIAGNOSIS — J90 Pleural effusion, not elsewhere classified: Secondary | ICD-10-CM | POA: Insufficient documentation

## 2012-05-02 DIAGNOSIS — I2510 Atherosclerotic heart disease of native coronary artery without angina pectoris: Secondary | ICD-10-CM

## 2012-05-02 DIAGNOSIS — R0609 Other forms of dyspnea: Secondary | ICD-10-CM

## 2012-05-02 DIAGNOSIS — Z951 Presence of aortocoronary bypass graft: Secondary | ICD-10-CM

## 2012-05-02 DIAGNOSIS — R0602 Shortness of breath: Secondary | ICD-10-CM | POA: Insufficient documentation

## 2012-05-02 LAB — GLUCOSE, CAPILLARY: Glucose-Capillary: 127 mg/dL — ABNORMAL HIGH (ref 70–99)

## 2012-05-02 MED ORDER — FUROSEMIDE 40 MG PO TABS: 60.0000 mg | ORAL_TABLET | Freq: Every day | ORAL | Status: DC

## 2012-05-02 NOTE — Assessment & Plan Note (Signed)
Heart rate is currently well controlled. She is on both digoxin, coreg and diltiazem. She has no complaints of racing heart rate.  She is mildly hypotensive. I have decreased lasix to 60 mg daily. I will check a CBC to evaluate for anemia with history of blood loss anemia during post-operative period.  She is also on coumadin and is being monitiored by [facility] for INR level and dosing.

## 2012-05-02 NOTE — Progress Notes (Signed)
HPI: [name] is a 78 y/o patient of we are seeing post hospital with history of CAD, s/p CABG after admission for ACS, severe 3 vessel disease, MVR. She had CABG on 04/04/2011 LIMA to LAD, SVG to Ramus, SVG to OM, SVG to PDA. She also had mitral valve annuloplasty repair and anterior leaflet plasty for ischemic mitral regurgitation. She had post operative anemia requiring blood transfusion, atrial fib, intolerant to amiodarone, and therefore placed on digoxin and diltiazem. She is on coumadin.  After discharge to SNF for PT, she became edematous and was found to be in CHF. She was treated with IV lasix initially and then placed on po lasix by Dr. Leanord Hawking at the [facility]. Labs were drawn revealing TSH elevation and mild anemia. She is here for continued assessment.  She feels generally weak, but less short of breath with the addition of lasix. She is working with PT, but is not up walking very much. She denies palpitations or further edema. She continues to have a mild, nonproductive cough. She is hypotensive today.  Allergies  Allergen Reactions  . Lisinopril Anaphylaxis  . Quinine Other (See Comments)    Pt states heart attack-like symptoms  . Sulfonamide Derivatives Other (See Comments)    unknown    Current Outpatient Prescriptions  Medication Sig Dispense Refill  . furosemide (LASIX) 40 MG tablet Take 1.5 tablets (60 mg total) by mouth daily.  45 tablet  3    Past Medical History  Diagnosis Date  . Type II or unspecified type diabetes mellitus without mention of complication, not stated as uncontrolled   . Unspecified hemorrhoids without mention of complication   . Depression   . CAD (coronary artery disease)     s/p PCI to LAD '08, Severe 3V CAD by cath 03/27/12 --> CABG  . Mitral regurgitation     moderate by cath, mild by echo 03/2012  . Persistent atrial fibrillation   . Osteoarthrosis, unspecified whether generalized or localized, unspecified site   . Personal history of other  diseases of digestive system   . Unspecified glaucoma   . Unspecified essential hypertension     Past Surgical History  Procedure Date  . Coronary artery bypass graft 04/03/2012    Procedure: CORONARY ARTERY BYPASS GRAFTING (CABG);  Surgeon: Kerin Perna, MD;  Location: [facility] OR;  Service: Open Heart Surgery;  Laterality: N/A;  CABG x four;  using left internal mammary artery and bilateral greater saphenous veins  . Mitral valve repair 04/03/2012    Procedure: MITRAL VALVE REPAIR (MVR);  Surgeon: Kerin Perna, MD;  Location: [facility] OR;  Service: Open Heart Surgery;  Laterality: N/A;  . Ercp 2008  . Cholecystectomy 12/2006    ZOX:WRUEAV of systems complete and found to be negative unless listed above   PHYSICAL EXAM BP 93/58  Pulse 74  Resp 16  Ht 5\' 8"  (1.727 m)  Wt 154 lb (69.854 kg)  BMI 23.42 kg/m2 General: Well developed, well nourished, in no acute distress sitting in a wheel chair. Pale Head: Eyes PERRLA, No xanthomas.   Normal cephalic and atramatic  Lungs: Bilateral rales without wheezes. Occasional coughing. Heart: HRIR without MRG.  Pulses are 2+ & equal.            No carotid bruit. No JVD.  No abdominal bruits. No femoral bruits. Abdomen: Bowel sounds are positive, abdomen soft and non-tender without masses or  Hernia's noted. Msk:  Back normal, normal gait. Normal strength and tone for age.Well healing sternotomy scar. No bleeding. Extremities: No clubbing, cyanosis,  1+ edema.  DP +1 Neuro: Alert and oriented X 3. Psych:  Good affect, responds appropriately     ASSESSMENT AND PLAN

## 2012-05-02 NOTE — Assessment & Plan Note (Signed)
She is doing well from this standpoint. Will continue to follow.

## 2012-05-02 NOTE — Assessment & Plan Note (Signed)
She continues to have some coughing and minimal LEE. I will recheck CXR.

## 2012-05-02 NOTE — Assessment & Plan Note (Signed)
She is stable concerning her recovery from surgery. She continues frail  but is actively participating in PT. She is due to see Dr. Morton Peters next week on follow-up. She has some post-operative CHF in the setting of atrial fib.  Review of labs show mild hyponatremia. Will decrease lasix to 60 mg daily with plans to decrease further. She has normal EF of 55%.

## 2012-05-02 NOTE — Patient Instructions (Addendum)
**Note De-Identified [name] Obfuscation** A chest x-ray takes a picture of the organs and structures inside the chest, including the heart, lungs, and blood vessels. This test can show several things, including, whether the heart is enlarges; whether fluid is building up in the lungs; and whether pacemaker / defibrillator leads are still in place.  Your physician has recommended you make the following change in your medication: decrease Lasix to 60 mg daily   Your physician recommends that you return for lab work in: TODAY  Your physician recommends that you schedule a follow-up appointment in: AS PREVIOUSLY SCHEDULED

## 2012-05-03 ENCOUNTER — Ambulatory Visit (HOSPITAL_COMMUNITY): Payer: Medicare Other | Attending: Internal Medicine

## 2012-05-03 DIAGNOSIS — J90 Pleural effusion, not elsewhere classified: Secondary | ICD-10-CM | POA: Insufficient documentation

## 2012-05-03 DIAGNOSIS — I509 Heart failure, unspecified: Secondary | ICD-10-CM | POA: Insufficient documentation

## 2012-05-03 DIAGNOSIS — I517 Cardiomegaly: Secondary | ICD-10-CM | POA: Insufficient documentation

## 2012-05-04 LAB — GLUCOSE, CAPILLARY: Glucose-Capillary: 80 mg/dL (ref 70–99)

## 2012-05-06 LAB — GLUCOSE, CAPILLARY: Glucose-Capillary: 131 mg/dL — ABNORMAL HIGH (ref 70–99)

## 2012-05-07 LAB — GLUCOSE, CAPILLARY: Glucose-Capillary: 119 mg/dL — ABNORMAL HIGH (ref 70–99)

## 2012-05-08 ENCOUNTER — Other Ambulatory Visit: Payer: Self-pay | Admitting: Cardiothoracic Surgery

## 2012-05-08 DIAGNOSIS — I059 Rheumatic mitral valve disease, unspecified: Secondary | ICD-10-CM

## 2012-05-08 LAB — GLUCOSE, CAPILLARY: Glucose-Capillary: 105 mg/dL — ABNORMAL HIGH (ref 70–99)

## 2012-05-09 LAB — GLUCOSE, CAPILLARY: Glucose-Capillary: 116 mg/dL — ABNORMAL HIGH (ref 70–99)

## 2012-05-10 ENCOUNTER — Ambulatory Visit (INDEPENDENT_AMBULATORY_CARE_PROVIDER_SITE_OTHER): Payer: Medicare Other | Admitting: Cardiology

## 2012-05-10 ENCOUNTER — Encounter: Payer: Self-pay | Admitting: Cardiothoracic Surgery

## 2012-05-10 ENCOUNTER — Ambulatory Visit: Payer: Medicare Other | Admitting: Cardiology

## 2012-05-10 ENCOUNTER — Ambulatory Visit (INDEPENDENT_AMBULATORY_CARE_PROVIDER_SITE_OTHER): Payer: Self-pay | Admitting: Cardiothoracic Surgery

## 2012-05-10 ENCOUNTER — Encounter: Payer: Self-pay | Admitting: Cardiology

## 2012-05-10 ENCOUNTER — Inpatient Hospital Stay
Admit: 2012-05-10 | Discharge: 2012-05-10 | Disposition: A | Discharge status: Home / Self Care | Payer: PRIVATE HEALTH INSURANCE | Attending: Cardiothoracic Surgery | Admitting: Cardiothoracic Surgery

## 2012-05-10 VITALS — BP 106/68 | HR 66 | Resp 20 | Ht 68.0 in | Wt 147.0 lb

## 2012-05-10 VITALS — BP 98/58 | HR 76 | Ht 68.0 in | Wt 147.0 lb

## 2012-05-10 DIAGNOSIS — I059 Rheumatic mitral valve disease, unspecified: Secondary | ICD-10-CM

## 2012-05-10 DIAGNOSIS — Z9889 Other specified postprocedural states: Secondary | ICD-10-CM

## 2012-05-10 DIAGNOSIS — I4891 Unspecified atrial fibrillation: Secondary | ICD-10-CM

## 2012-05-10 DIAGNOSIS — Z951 Presence of aortocoronary bypass graft: Secondary | ICD-10-CM

## 2012-05-10 DIAGNOSIS — I2510 Atherosclerotic heart disease of native coronary artery without angina pectoris: Secondary | ICD-10-CM

## 2012-05-10 DIAGNOSIS — I5022 Chronic systolic (congestive) heart failure: Secondary | ICD-10-CM

## 2012-05-10 DIAGNOSIS — I10 Essential (primary) hypertension: Secondary | ICD-10-CM

## 2012-05-10 LAB — BASIC METABOLIC PANEL
CO2: 27 mEq/L (ref 19–32)
Calcium: 9.2 mg/dL (ref 8.4–10.5)
Chloride: 98 mEq/L (ref 96–112)
Creatinine, Ser: 0.9 mg/dL (ref 0.4–1.2)
Glucose, Bld: 189 mg/dL — ABNORMAL HIGH (ref 70–99)
Sodium: 134 mEq/L — ABNORMAL LOW (ref 135–145)

## 2012-05-10 LAB — GLUCOSE, CAPILLARY

## 2012-05-10 NOTE — Progress Notes (Signed)
HPI [name] comes in today with her family for close followup or her chronic systolic heart failure, recent bypass surgery with mitral valve failure, A. fib on anticoagulation and rate control.  Recent records reviewed from visit in [place]. Digoxin level was 1.99 the dose was reduced. Her weight is coming down additional 7 pounds since last week. She denies orthopnea or PND or edema. Her edema has resolved.  She denies any angina or ischemic symptoms. She is anxious to go home from rehabilitation.  She is seeing Dr. Donata Clay this afternoon.  Past Medical History  Diagnosis Date  . Type II or unspecified type diabetes mellitus without mention of complication, not stated as uncontrolled   . Unspecified hemorrhoids without mention of complication   . Depression   . CAD (coronary artery disease)     s/p PCI to LAD '08, Severe 3V CAD by cath 03/27/12 --> CABG  . Mitral regurgitation     moderate by cath, mild by echo 03/2012  . Persistent atrial fibrillation   . Osteoarthrosis, unspecified whether generalized or localized, unspecified site   . Personal history of other diseases of digestive system   . Unspecified glaucoma   . Unspecified essential hypertension   . Thyroid disease     hypothyroidism  . CHF (congestive heart failure)     Current Outpatient Prescriptions  Medication Sig Dispense Refill  . furosemide (LASIX) 40 MG tablet Take 1.5 tablets (60 mg total) by mouth daily.  45 tablet  3    Allergies  Allergen Reactions  . Lisinopril Anaphylaxis  . Quinine Other (See Comments)    Pt states heart attack-like symptoms  . Sulfonamide Derivatives Other (See Comments)    unknown    No family history on file.  History   Social History  . Marital Status: Married    Spouse Name: N/A    Number of Children: N/A  . Years of Education: N/A   Occupational History  . Not on file.   Social History Main Topics  . Smoking status: Never Smoker   . Smokeless tobacco:  Never Used  . Alcohol Use: No  . Drug Use: No  . Sexually Active: Not on file   Other Topics Concern  . Not on file   Social History Narrative  . No narrative on file    ROS ALL NEGATIVE EXCEPT THOSE NOTED IN HPI  PE  General Appearance: well developed, well nourished in no acute distress, chronically ill appearing HEENT: symmetrical face, PERRLA  Neck: no JVD, thyromegaly, or adenopathy, trachea midline Chest: symmetric without deformity Cardiac: PMI non-displaced, irregular rate and rhythm, normal S1, S2, no gallop or murmur Lung: Few dry crackles Vascular: all pulses full without bruits  Abdominal: nondistended, nontender, good bowel sounds, no HSM, no bruits Extremities: no cyanosis, clubbing or edema, no sign of DVT, no varicosities  Skin: normal color, no rashes Neuro: alert and oriented x 3, non-focal Pysch: normal affect  EKG  BMET    Component Value Date/Time   NA 137 04/16/2012 0500   K 4.3 04/16/2012 0500   CL 97 04/16/2012 0500   CO2 32 04/16/2012 0500   GLUCOSE 136* 04/16/2012 0500   BUN 16 04/16/2012 0500   CREATININE 0.70 04/16/2012 0500   CALCIUM 8.9 04/16/2012 0500   GFRNONAA 81* 04/16/2012 0500   GFRAA >90 04/16/2012 0500    Lipid Panel     Component Value Date/Time   CHOL 149 03/29/2012 0515   TRIG 121 03/29/2012 0515  HDL 36* 03/29/2012 0515   CHOLHDL 4.1 03/29/2012 0515   VLDL 24 03/29/2012 0515   LDLCALC 89 03/29/2012 0515    CBC    Component Value Date/Time   WBC 9.9 04/16/2012 0500   RBC 3.31* 04/16/2012 0500   HGB 8.9* 04/16/2012 0500   HCT 28.1* 04/16/2012 0500   PLT 464* 04/16/2012 0500   MCV 84.9 04/16/2012 0500   MCH 26.9 04/16/2012 0500   MCHC 31.7 04/16/2012 0500   RDW 18.6* 04/16/2012 0500   LYMPHSABS 1.6 03/26/2012 0632   MONOABS 0.6 03/26/2012 0632   EOSABS 0.1 03/26/2012 0632   BASOSABS 0.1 03/26/2012 1610

## 2012-05-10 NOTE — Progress Notes (Signed)
PCP is Valera Castle, MD, MD Referring Provider is Herby Abraham, MD  Chief Complaint  Patient presents with  . Routine Post Op    4 week f/u from surgery with CXR, S/P  CABG x4 and Mital valve repair on 04/03/12    HPI: 78 year old female 1 month postop CABG x4 with mitral valve annuloplasty repair. Postoperative course complicated by MI induced CHF, atrial fibrillation, fluid retention, nausea and deconditioning. She was discharged to skilled nursing facility at the [facility] center and is now home. She is on Coumadin for rate controlled atrial fibrillation. She is lost all of her fluid weight. She is and Lipitor he with a walker. The incisions are healed.    Past Medical History  Diagnosis Date  . Type II or unspecified type diabetes mellitus without mention of complication, not stated as uncontrolled   . Unspecified hemorrhoids without mention of complication   . Depression   . CAD (coronary artery disease)     s/p PCI to LAD '08, Severe 3V CAD by cath 03/27/12 --> CABG  . Mitral regurgitation     moderate by cath, mild by echo 03/2012  . Persistent atrial fibrillation   . Osteoarthrosis, unspecified whether generalized or localized, unspecified site   . Personal history of other diseases of digestive system   . Unspecified glaucoma   . Unspecified essential hypertension   . Thyroid disease     hypothyroidism  . CHF (congestive heart failure)     Past Surgical History  Procedure Date  . Coronary artery bypass graft 04/03/2012    Procedure: CORONARY ARTERY BYPASS GRAFTING (CABG);  Surgeon: Kerin Perna, MD;  Location: [facility] OR;  Service: Open Heart Surgery;  Laterality: N/A;  CABG x four;  using left internal mammary artery and bilateral greater saphenous veins  . Mitral valve repair 04/03/2012    Procedure: MITRAL VALVE REPAIR (MVR);  Surgeon: Kerin Perna, MD;  Location: [facility] OR;  Service: Open Heart Surgery;  Laterality: N/A;  . Ercp 2008  . Cholecystectomy 12/2006    No family  history on file.  Social History History  Substance Use Topics  . Smoking status: Never Smoker   . Smokeless tobacco: Never Used  . Alcohol Use: No    Current Outpatient Prescriptions  Medication Sig Dispense Refill  . furosemide (LASIX) 40 MG tablet Take 1.5 tablets (60 mg total) by mouth daily.  45 tablet  3    Allergies  Allergen Reactions  . Lisinopril Anaphylaxis  . Quinine Other (See Comments)    Pt states heart attack-like symptoms  . Sulfonamide Derivatives Other (See Comments)    unknown    Review of Systems appetite and strength returning. No fever. Weight loss is reversing. No symptoms of CHF   BP 106/68  Pulse 66  Resp 20  Ht 5\' 8"  (1.727 m)  Wt 147 lb (66.679 kg)  BMI 22.35 kg/m2  SpO2 95% Physical Exam  general alert and appropriate  Neck without JVD or tenderness Breath sounds with scattered rales on the right Sternal incision well-healed Cardiac rhythm irregular consistent with atrial fibrillation, no murmur of MR Legs without edema vein harvest site healed Neuro intact but weak  Diagnostic Tests:  chest x-ray with postoperative changes no significant pleural effusion sternal wires intact   Impression and plan Progressive but slow improvement after urgent CABG x4 and mitral valve repair. Atrial fibrillation controlled with digoxin, Cardizem, Corag. She is intolerant of amiodarone in the hospital. She has an appointment  to see her cardiologist Dr. wall at the [facility] cardiology office in 3 weeks. Return to see Dr. wall is scheduled in 3 weeks. Return here as needed.

## 2012-05-10 NOTE — Assessment & Plan Note (Signed)
Currently euvolemic or a little dry. We'll decrease her Lasix to 40 mg a day and check metabolic profile. Also check dig level with lower dose. I have cleared her to go home for the [facility]. Discussed with family continue to monitor weight daily. Congestive heart failure protocol reviewed.

## 2012-05-10 NOTE — Assessment & Plan Note (Signed)
Rate controlled and on anticoagulation. No change in treatment. 

## 2012-05-10 NOTE — Patient Instructions (Addendum)
Your physician recommends that you have lab work drawn today: bmp, digoxin level  Your physician has recommended you make the following change in your medication:  Decrease Lasix (furosemide) to 40mg  1 tablet daily.  Your physician recommends that you weigh, daily, at the same time every day, and in the same amount of clothing. Please record your daily weights on the handout provided and bring it to your next appointment.  Your physician has cleared you to return home from the [facility] in [place].  Continue to reduce the salt in your diet.  Keep your follow-up appointment for June

## 2012-05-11 ENCOUNTER — Telehealth: Payer: Self-pay | Admitting: *Deleted

## 2012-05-11 DIAGNOSIS — I4891 Unspecified atrial fibrillation: Secondary | ICD-10-CM

## 2012-05-11 NOTE — Telephone Encounter (Signed)
Phoned pt and spoke to daughter--please stop dixoxin --level continues to be elevated--daughter states she will d/c--per dr Graciela Husbands (DOD) have pt stop digoxin and notify dr wall that we have stopped, so he may follow up--pt[facility] sent her home and told her to not to be active, but daughter states dr wall told them she needed to walk--advised to follow dr Vern Claude orders--daughter agrees--nt

## 2012-05-12 MED ORDER — DIGOXIN 0.0625 MG HALF TABLET: 0.0625 ug | ORAL_TABLET | Freq: Every day | ORAL | Status: DC

## 2012-05-12 NOTE — Telephone Encounter (Signed)
I want her to start next Tuesday on 0.0625mg  with digoxin level in 10 days. I have sent this to [facility] in [place] as well.

## 2012-05-12 NOTE — Telephone Encounter (Signed)
I spoke with pt about restarting her Digoxin at 1/2 the dose of Tuesday of next week. She will go to the [facility] lab across from [place] on Thursday May 30th for repeat Digoxin level. Pt understands. I will mail her reminder to have lab work drawn.  I do not have a number to call her daughter and let her know. Mylo Red RN

## 2012-05-15 ENCOUNTER — Ambulatory Visit (INDEPENDENT_AMBULATORY_CARE_PROVIDER_SITE_OTHER): Payer: Medicare Other | Admitting: *Deleted

## 2012-05-15 DIAGNOSIS — Z9889 Other specified postprocedural states: Secondary | ICD-10-CM

## 2012-05-15 DIAGNOSIS — Z7901 Long term (current) use of anticoagulants: Secondary | ICD-10-CM

## 2012-05-15 DIAGNOSIS — I4891 Unspecified atrial fibrillation: Secondary | ICD-10-CM

## 2012-05-15 LAB — POCT INR: INR: 2.1

## 2012-05-18 ENCOUNTER — Encounter: Payer: Self-pay | Admitting: Cardiology

## 2012-05-24 ENCOUNTER — Ambulatory Visit (INDEPENDENT_AMBULATORY_CARE_PROVIDER_SITE_OTHER): Payer: Medicare Other | Admitting: *Deleted

## 2012-05-24 DIAGNOSIS — I4891 Unspecified atrial fibrillation: Secondary | ICD-10-CM

## 2012-05-24 DIAGNOSIS — Z9889 Other specified postprocedural states: Secondary | ICD-10-CM

## 2012-05-24 DIAGNOSIS — Z7901 Long term (current) use of anticoagulants: Secondary | ICD-10-CM

## 2012-05-24 LAB — POCT INR: INR: 1.6

## 2012-05-25 ENCOUNTER — Other Ambulatory Visit: Payer: Self-pay | Admitting: Adult Health

## 2012-05-26 LAB — CBC WITH DIFFERENTIAL/PLATELET
Basophils Absolute: 0 10*3/uL (ref 0.0–0.1)
Basophils Relative: 1 % (ref 0–1)
HCT: 32.6 % — ABNORMAL LOW (ref 36.0–46.0)
Lymphocytes Relative: 27 % (ref 12–46)
MCHC: 31.9 g/dL (ref 30.0–36.0)
Monocytes Absolute: 0.8 10*3/uL (ref 0.1–1.0)
Neutro Abs: 4.9 10*3/uL (ref 1.7–7.7)
Neutrophils Relative %: 61 % (ref 43–77)
RDW: 17.6 % — ABNORMAL HIGH (ref 11.5–15.5)
WBC: 8 10*3/uL (ref 4.0–10.5)

## 2012-05-26 LAB — BRAIN NATRIURETIC PEPTIDE: Brain Natriuretic Peptide: 191.7 pg/mL — ABNORMAL HIGH (ref 0.0–100.0)

## 2012-05-29 ENCOUNTER — Telehealth: Payer: Self-pay

## 2012-05-29 DIAGNOSIS — Z79899 Other long term (current) drug therapy: Secondary | ICD-10-CM

## 2012-05-29 NOTE — Telephone Encounter (Signed)
**Note De-Identified [name] Obfuscation** Pt. Is to f/u with Dr. Daleen Squibb on 6-5. Upon review of pt's chart, She was advised by a nurse in [place] office to have Dig level drawn on 5-30 but the labs that were drawn included CBC and BNP, no dig level. Pt. States that she will try to have drawn today or tomorrow so we will have results at her OV on Wednesday. Lab order faxed to [facility] lab at [phone]./LV

## 2012-05-31 ENCOUNTER — Ambulatory Visit (INDEPENDENT_AMBULATORY_CARE_PROVIDER_SITE_OTHER): Payer: Medicare Other | Admitting: *Deleted

## 2012-05-31 ENCOUNTER — Encounter: Payer: Self-pay | Admitting: Cardiology

## 2012-05-31 ENCOUNTER — Ambulatory Visit (INDEPENDENT_AMBULATORY_CARE_PROVIDER_SITE_OTHER): Payer: Medicare Other | Admitting: Cardiology

## 2012-05-31 VITALS — BP 102/67 | HR 83 | Resp 16 | Ht 68.0 in | Wt 143.0 lb

## 2012-05-31 DIAGNOSIS — Z9889 Other specified postprocedural states: Secondary | ICD-10-CM

## 2012-05-31 DIAGNOSIS — I2510 Atherosclerotic heart disease of native coronary artery without angina pectoris: Secondary | ICD-10-CM

## 2012-05-31 DIAGNOSIS — I509 Heart failure, unspecified: Secondary | ICD-10-CM

## 2012-05-31 DIAGNOSIS — Z951 Presence of aortocoronary bypass graft: Secondary | ICD-10-CM

## 2012-05-31 DIAGNOSIS — I4891 Unspecified atrial fibrillation: Secondary | ICD-10-CM

## 2012-05-31 DIAGNOSIS — I959 Hypotension, unspecified: Secondary | ICD-10-CM

## 2012-05-31 DIAGNOSIS — Z7901 Long term (current) use of anticoagulants: Secondary | ICD-10-CM

## 2012-05-31 DIAGNOSIS — I5042 Chronic combined systolic (congestive) and diastolic (congestive) heart failure: Secondary | ICD-10-CM

## 2012-05-31 DIAGNOSIS — I10 Essential (primary) hypertension: Secondary | ICD-10-CM

## 2012-05-31 LAB — DIGOXIN LEVEL: Digoxin Level: 0.6 ng/mL — ABNORMAL LOW (ref 0.8–2.0)

## 2012-05-31 LAB — POCT INR: INR: 1.7

## 2012-05-31 NOTE — Assessment & Plan Note (Signed)
Well-controlled. No change in medications. 

## 2012-05-31 NOTE — Patient Instructions (Signed)
**Note De-Identified [name] Obfuscation** Your physician has recommended you make the following change in your medication: stop taking Diltiazem  Your physician recommends that you schedule a follow-up appointment in: 2 weeks

## 2012-05-31 NOTE — Progress Notes (Signed)
HPI [name] comes in today for close followup of her recent coronary artery bypass grafting and mitral valve repair. This was complicated by postoperative atrial fib congestive heart failure. The congestive heart failure has resolved and she is now time living with her husband. Her daughter is with her today and is very attentive. The atrial fib persist. She is being treated with rate control and anticoagulation.  She's been having a number of dizzy spells particularly when she stands up. She also has intermittent nausea and vomits once. She is not nauseous on a regular basis. We checked a digoxin level and it was 0.6.  Looking at her Journal her blood pressures have been low running in the mid-90s systolic. Heart rates and under good control in the 60s to 70 range. Blood sugars have also been good and not too low.  She is on diltiazem, carvedilol, and digoxin. She denies any orthopnea, PND or edema.  Her appetite is good. She denies any dysphagia. She denies any change in bowel habits. She denies any melena. She's had no fever chills. She says her gallbladder has been taken out the past.  Past Medical History  Diagnosis Date  . Type II or unspecified type diabetes mellitus without mention of complication, not stated as uncontrolled   . Unspecified hemorrhoids without mention of complication   . Depression   . CAD (coronary artery disease)     s/p PCI to LAD '08, Severe 3V CAD by cath 03/27/12 --> CABG  . Mitral regurgitation     moderate by cath, mild by echo 03/2012  . Persistent atrial fibrillation   . Osteoarthrosis, unspecified whether generalized or localized, unspecified site   . Personal history of other diseases of digestive system   . Unspecified glaucoma   . Unspecified essential hypertension   . Thyroid disease     hypothyroidism  . CHF (congestive heart failure)     Current Outpatient Prescriptions  Medication Sig Dispense Refill  . aspirin 81 MG EC tablet Take 1 tablet  (81 mg total) by mouth daily.  30 tablet    . atorvastatin (LIPITOR) 40 MG tablet Take 1 tablet (40 mg total) by mouth daily at 6 PM.  30 tablet  1  . carvedilol (COREG) 25 MG tablet Take 25 mg by mouth 2 (two) times daily with a meal.      . digoxin (LANOXIN) 0.0625 mg TABS Take 0.5 tablets (62.5 mcg total) by mouth daily.      [name] FLUoxetine (PROZAC) 20 MG tablet Take 20 mg by mouth daily.      . furosemide (LASIX) 40 MG tablet Take 1.5 tablets (60 mg total) by mouth daily.  45 tablet  3  . omeprazole (PRILOSEC) 20 MG capsule Take 20 mg by mouth daily.      . potassium chloride SA (K-DUR,KLOR-CON) 20 MEQ tablet Take 20 mEq by mouth daily.       . sitaGLIPtan-metformin (JANUMET) 50-500 MG per tablet Take 1 tablet by mouth 2 (two) times daily with a meal.      . warfarin (COUMADIN) 2 MG tablet Take 1 tablet (2 mg total) by mouth daily at 6 PM. Or as directed by Dr. Vern Claude office.  30 tablet  1    Allergies  Allergen Reactions  . Lisinopril Anaphylaxis  . Quinine Other (See Comments)    Pt states heart attack-like symptoms  . Sulfonamide Derivatives Other (See Comments)    unknown    No family history on file.  History   Social History  . Marital Status: Married    Spouse Name: N/A    Number of Children: N/A  . Years of Education: N/A   Occupational History  . Not on file.   Social History Main Topics  . Smoking status: Never Smoker   . Smokeless tobacco: Never Used  . Alcohol Use: No  . Drug Use: No  . Sexually Active: Not on file   Other Topics Concern  . Not on file   Social History Narrative  . No narrative on file    ROS ALL NEGATIVE EXCEPT THOSE NOTED IN HPI  PE  General Appearance: well developed, well nourished in no acute distress, frail HEENT: symmetrical face, PERRLA, good dentition  Neck: no JVD, thyromegaly, or adenopathy, trachea midline Chest: symmetric without deformity, sternal incision intact Cardiac: PMI non-displaced, irregular rate and rhythm,  normal S1, S2, no gallop or murmur Lung: clear to ausculation and percussion Vascular: Diminished but palpable in the lower extremities. Abdominal: nondistended, nontender, good bowel sounds,  Extremities: no cyanosis, clubbing or edema, no sign of DVT, no varicosities  Skin: normal color, no rashes Neuro: alert and oriented x 3, non-focal Pysch: normal affect  EKG  BMET    Component Value Date/Time   NA 134* 05/10/2012 1007   K 4.6 05/10/2012 1007   CL 98 05/10/2012 1007   CO2 27 05/10/2012 1007   GLUCOSE 189* 05/10/2012 1007   BUN 17 05/10/2012 1007   CREATININE 0.9 05/10/2012 1007   CALCIUM 9.2 05/10/2012 1007   GFRNONAA 81* 04/16/2012 0500   GFRAA >90 04/16/2012 0500    Lipid Panel     Component Value Date/Time   CHOL 149 03/29/2012 0515   TRIG 121 03/29/2012 0515   HDL 36* 03/29/2012 0515   CHOLHDL 4.1 03/29/2012 0515   VLDL 24 03/29/2012 0515   LDLCALC 89 03/29/2012 0515    CBC    Component Value Date/Time   WBC 8.0 05/25/2012 1255   RBC 3.94 05/25/2012 1255   HGB 10.4* 05/25/2012 1255   HCT 32.6* 05/25/2012 1255   PLT 362 05/25/2012 1255   MCV 82.7 05/25/2012 1255   MCH 26.4 05/25/2012 1255   MCHC 31.9 05/25/2012 1255   RDW 17.6* 05/25/2012 1255   LYMPHSABS 2.2 05/25/2012 1255   MONOABS 0.8 05/25/2012 1255   EOSABS 0.1 05/25/2012 1255   BASOSABS 0.0 05/25/2012 1255

## 2012-05-31 NOTE — Assessment & Plan Note (Signed)
Discontinue diltiazem. Close followup with me for symptomatic improvable couple weeks.

## 2012-05-31 NOTE — Assessment & Plan Note (Signed)
Rate is well-controlled and she is anticoagulated. We'll discontinue her diltiazem because of low blood pressure and symptoms of dizziness and nausea.

## 2012-06-07 ENCOUNTER — Ambulatory Visit (INDEPENDENT_AMBULATORY_CARE_PROVIDER_SITE_OTHER): Payer: Medicare Other | Admitting: *Deleted

## 2012-06-07 DIAGNOSIS — I482 Chronic atrial fibrillation, unspecified: Secondary | ICD-10-CM

## 2012-06-07 DIAGNOSIS — Z9889 Other specified postprocedural states: Secondary | ICD-10-CM

## 2012-06-07 DIAGNOSIS — Z7901 Long term (current) use of anticoagulants: Secondary | ICD-10-CM

## 2012-06-07 DIAGNOSIS — I4891 Unspecified atrial fibrillation: Secondary | ICD-10-CM

## 2012-06-10 ENCOUNTER — Other Ambulatory Visit (HOSPITAL_COMMUNITY): Payer: Self-pay | Admitting: Physician Assistant

## 2012-06-13 ENCOUNTER — Ambulatory Visit (INDEPENDENT_AMBULATORY_CARE_PROVIDER_SITE_OTHER): Payer: Medicare Other | Admitting: *Deleted

## 2012-06-13 ENCOUNTER — Encounter: Payer: Self-pay | Admitting: Cardiology

## 2012-06-13 ENCOUNTER — Ambulatory Visit (INDEPENDENT_AMBULATORY_CARE_PROVIDER_SITE_OTHER): Payer: Medicare Other | Admitting: Cardiology

## 2012-06-13 VITALS — BP 122/73 | HR 87 | Resp 16 | Ht 68.0 in | Wt 147.0 lb

## 2012-06-13 DIAGNOSIS — Z7901 Long term (current) use of anticoagulants: Secondary | ICD-10-CM

## 2012-06-13 DIAGNOSIS — Z9889 Other specified postprocedural states: Secondary | ICD-10-CM

## 2012-06-13 DIAGNOSIS — E119 Type 2 diabetes mellitus without complications: Secondary | ICD-10-CM

## 2012-06-13 DIAGNOSIS — I482 Chronic atrial fibrillation, unspecified: Secondary | ICD-10-CM

## 2012-06-13 DIAGNOSIS — I4891 Unspecified atrial fibrillation: Secondary | ICD-10-CM

## 2012-06-13 DIAGNOSIS — Z951 Presence of aortocoronary bypass graft: Secondary | ICD-10-CM

## 2012-06-13 DIAGNOSIS — I2510 Atherosclerotic heart disease of native coronary artery without angina pectoris: Secondary | ICD-10-CM

## 2012-06-13 DIAGNOSIS — I959 Hypotension, unspecified: Secondary | ICD-10-CM

## 2012-06-13 LAB — POCT INR: INR: 1.7

## 2012-06-13 NOTE — Assessment & Plan Note (Signed)
Stable. Rate control and anticoagulation. We'll check her anticoagulation level today.

## 2012-06-13 NOTE — Progress Notes (Signed)
HPI [name] returns today for close followup of her complex postoperative history. Please refer to my previous note for details.  On her last visit, she had been hypotensive at home. She was having dizziness and nausea. I discontinued her diltiazem which is helped significantly. She still has low-grade nausea but her appetite is good. She's not had any further dizziness. Blood pressure is stable today. She denies any rapid heart rate or palpitations. Her weight is stable by her record. Heart rate is running in the 80s per her record.  She's very compliant. Digoxin level was normal in the past. She does have some left ventricular systolic dysfunction so I have kept her on low-dose dig.  The only new medication is Lipitor.  Past Medical History  Diagnosis Date  . Type II or unspecified type diabetes mellitus without mention of complication, not stated as uncontrolled   . Unspecified hemorrhoids without mention of complication   . Depression   . CAD (coronary artery disease)     s/p PCI to LAD '08, Severe 3V CAD by cath 03/27/12 --> CABG  . Mitral regurgitation     moderate by cath, mild by echo 03/2012  . Persistent atrial fibrillation   . Osteoarthrosis, unspecified whether generalized or localized, unspecified site   . Personal history of other diseases of digestive system   . Unspecified glaucoma   . Unspecified essential hypertension   . Thyroid disease     hypothyroidism  . CHF (congestive heart failure)     Current Outpatient Prescriptions  Medication Sig Dispense Refill  . aspirin 81 MG EC tablet Take 1 tablet (81 mg total) by mouth daily.  30 tablet    . carvedilol (COREG) 25 MG tablet Take 25 mg by mouth 2 (two) times daily with a meal.      . digoxin (LANOXIN) 0.0625 mg TABS Take 0.5 tablets (62.5 mcg total) by mouth daily.      [name] FLUoxetine (PROZAC) 20 MG tablet Take 20 mg by mouth daily.      . furosemide (LASIX) 40 MG tablet Take 40 mg by mouth daily.      [name] omeprazole  (PRILOSEC) 20 MG capsule Take 20 mg by mouth daily.      . potassium chloride SA (K-DUR,KLOR-CON) 20 MEQ tablet Take 20 mEq by mouth daily.       . sitaGLIPtan-metformin (JANUMET) 50-500 MG per tablet Take 1 tablet by mouth 2 (two) times daily with a meal.      . warfarin (COUMADIN) 2 MG tablet Take 1 tablet (2 mg total) by mouth daily at 6 PM. Or as directed by Dr. Vern Claude office.  30 tablet  1  . DISCONTD: furosemide (LASIX) 40 MG tablet Take 1.5 tablets (60 mg total) by mouth daily.  45 tablet  3    Allergies  Allergen Reactions  . Lisinopril Anaphylaxis  . Quinine Other (See Comments)    Pt states heart attack-like symptoms  . Sulfonamide Derivatives Other (See Comments)    unknown    No family history on file.  History   Social History  . Marital Status: Married    Spouse Name: N/A    Number of Children: N/A  . Years of Education: N/A   Occupational History  . Not on file.   Social History Main Topics  . Smoking status: Former Games developer  . Smokeless tobacco: Never Used  . Alcohol Use: No  . Drug Use: No  . Sexually Active: Not on file  Other Topics Concern  . Not on file   Social History Narrative  . No narrative on file    ROS ALL NEGATIVE EXCEPT THOSE NOTED IN HPI  PE  General Appearance: well developed, well nourished in no acute distress HEENT: symmetrical face, PERRLA, good dentition  Neck: no JVD, thyromegaly, or adenopathy, trachea midline Chest: symmetric without deformity Cardiac: PMI non-displaced, RRR, normal S1, S2, no gallop or murmur Lung: clear to ausculation and percussion Vascular: all pulses full without bruits  Abdominal: nondistended, nontender, good bowel sounds, no HSM, no bruits Extremities: no cyanosis, clubbing or edema, no sign of DVT, no varicosities  Skin: normal color, no rashes Neuro: alert and oriented x 3, non-focal Pysch: normal affect  EKG  BMET    Component Value Date/Time   NA 134* 05/10/2012 1007   K 4.6 05/10/2012  1007   CL 98 05/10/2012 1007   CO2 27 05/10/2012 1007   GLUCOSE 189* 05/10/2012 1007   BUN 17 05/10/2012 1007   CREATININE 0.9 05/10/2012 1007   CALCIUM 9.2 05/10/2012 1007   GFRNONAA 81* 04/16/2012 0500   GFRAA >90 04/16/2012 0500    Lipid Panel     Component Value Date/Time   CHOL 149 03/29/2012 0515   TRIG 121 03/29/2012 0515   HDL 36* 03/29/2012 0515   CHOLHDL 4.1 03/29/2012 0515   VLDL 24 03/29/2012 0515   LDLCALC 89 03/29/2012 0515    CBC    Component Value Date/Time   WBC 8.0 05/25/2012 1255   RBC 3.94 05/25/2012 1255   HGB 10.4* 05/25/2012 1255   HCT 32.6* 05/25/2012 1255   PLT 362 05/25/2012 1255   MCV 82.7 05/25/2012 1255   MCH 26.4 05/25/2012 1255   MCHC 31.9 05/25/2012 1255   RDW 17.6* 05/25/2012 1255   LYMPHSABS 2.2 05/25/2012 1255   MONOABS 0.8 05/25/2012 1255   EOSABS 0.1 05/25/2012 1255   BASOSABS 0.0 05/25/2012 1255

## 2012-06-13 NOTE — Assessment & Plan Note (Signed)
Stable. Because of persistent nausea and Lipitor being a new drug, we'll discontinue to see if this improves. He will call us know. If it does improve we'll try Pravachol 40 mg each bedtime. If it is not improved, we'll put her back on Lipitor. I'll see her back for close followup in 6 weeks

## 2012-06-13 NOTE — Patient Instructions (Addendum)
**Note De-Identified [name] Obfuscation** Your physician has recommended you make the following change in your medication: stop taking Lipitor and call office at [phone] in 1 to 2 weeks to let us know if symptoms have resolved.  Your physician recommends that you schedule a follow-up appointment in: 4 to 6 weeks

## 2012-06-13 NOTE — Assessment & Plan Note (Signed)
Improved and resolved. No further Cardizem. Continue carvedilol and the digoxin  for rate control and LV systolic dysfunction.

## 2012-06-22 ENCOUNTER — Ambulatory Visit (INDEPENDENT_AMBULATORY_CARE_PROVIDER_SITE_OTHER): Payer: Medicare Other | Admitting: *Deleted

## 2012-06-22 DIAGNOSIS — Z7901 Long term (current) use of anticoagulants: Secondary | ICD-10-CM

## 2012-06-22 DIAGNOSIS — Z9889 Other specified postprocedural states: Secondary | ICD-10-CM

## 2012-06-22 DIAGNOSIS — I482 Chronic atrial fibrillation, unspecified: Secondary | ICD-10-CM

## 2012-06-22 DIAGNOSIS — I4891 Unspecified atrial fibrillation: Secondary | ICD-10-CM

## 2012-06-26 ENCOUNTER — Ambulatory Visit (INDEPENDENT_AMBULATORY_CARE_PROVIDER_SITE_OTHER): Payer: Medicare Other | Admitting: *Deleted

## 2012-06-26 DIAGNOSIS — I4891 Unspecified atrial fibrillation: Secondary | ICD-10-CM

## 2012-06-26 DIAGNOSIS — Z9889 Other specified postprocedural states: Secondary | ICD-10-CM

## 2012-06-26 DIAGNOSIS — I482 Chronic atrial fibrillation, unspecified: Secondary | ICD-10-CM

## 2012-06-26 DIAGNOSIS — Z7901 Long term (current) use of anticoagulants: Secondary | ICD-10-CM

## 2012-06-26 MED ORDER — WARFARIN SODIUM 5 MG PO TABS: 5.0000 mg | ORAL_TABLET | Freq: Every day | ORAL | Status: DC

## 2012-07-03 ENCOUNTER — Ambulatory Visit (INDEPENDENT_AMBULATORY_CARE_PROVIDER_SITE_OTHER): Payer: Medicare Other | Admitting: *Deleted

## 2012-07-03 DIAGNOSIS — Z9889 Other specified postprocedural states: Secondary | ICD-10-CM

## 2012-07-03 DIAGNOSIS — Z7901 Long term (current) use of anticoagulants: Secondary | ICD-10-CM

## 2012-07-03 DIAGNOSIS — I4891 Unspecified atrial fibrillation: Secondary | ICD-10-CM

## 2012-07-03 DIAGNOSIS — I482 Chronic atrial fibrillation, unspecified: Secondary | ICD-10-CM

## 2012-07-17 ENCOUNTER — Encounter: Payer: Self-pay | Admitting: Cardiology

## 2012-07-17 ENCOUNTER — Ambulatory Visit (INDEPENDENT_AMBULATORY_CARE_PROVIDER_SITE_OTHER): Payer: Medicare Other | Admitting: *Deleted

## 2012-07-17 ENCOUNTER — Ambulatory Visit (INDEPENDENT_AMBULATORY_CARE_PROVIDER_SITE_OTHER): Payer: Medicare Other | Admitting: Cardiology

## 2012-07-17 VITALS — BP 110/60 | HR 72 | Ht 68.0 in | Wt 148.8 lb

## 2012-07-17 DIAGNOSIS — I4891 Unspecified atrial fibrillation: Secondary | ICD-10-CM

## 2012-07-17 DIAGNOSIS — I10 Essential (primary) hypertension: Secondary | ICD-10-CM

## 2012-07-17 DIAGNOSIS — Z9889 Other specified postprocedural states: Secondary | ICD-10-CM

## 2012-07-17 DIAGNOSIS — I482 Chronic atrial fibrillation, unspecified: Secondary | ICD-10-CM

## 2012-07-17 DIAGNOSIS — Z7901 Long term (current) use of anticoagulants: Secondary | ICD-10-CM

## 2012-07-17 DIAGNOSIS — I5022 Chronic systolic (congestive) heart failure: Secondary | ICD-10-CM

## 2012-07-17 DIAGNOSIS — E785 Hyperlipidemia, unspecified: Secondary | ICD-10-CM

## 2012-07-17 DIAGNOSIS — I2510 Atherosclerotic heart disease of native coronary artery without angina pectoris: Secondary | ICD-10-CM

## 2012-07-17 DIAGNOSIS — Z951 Presence of aortocoronary bypass graft: Secondary | ICD-10-CM

## 2012-07-17 DIAGNOSIS — I252 Old myocardial infarction: Secondary | ICD-10-CM | POA: Insufficient documentation

## 2012-07-17 LAB — POCT INR: INR: 3.2

## 2012-07-17 MED ORDER — PRAVASTATIN SODIUM 40 MG PO TABS: 40.0000 mg | ORAL_TABLET | Freq: Every evening | ORAL | Status: DC

## 2012-07-17 NOTE — Patient Instructions (Addendum)
Your physician has recommended you make the following change in your medication: Start Pravastatin 40 mg every evening at bedtime  Your physician recommends that you return for lab work in: 6 weeks for cmp and liver.   Your physician recommends that you schedule a follow-up appointment in: 6 weeks with Dr. Daleen Squibb after lab work completed.

## 2012-07-17 NOTE — Assessment & Plan Note (Signed)
She clearly got better off Lipitor in terms of her nausea. We'll begin 40 mg of pravastatin each bedtime and hope she will tolerate it. We'll check lipids and a comprehensive metabolic profile in 6 weeks. Goal LDL less than 70.

## 2012-07-17 NOTE — Assessment & Plan Note (Signed)
Other than some minimal pedal edema in the day she is doing well. No change in medications.

## 2012-07-17 NOTE — Progress Notes (Signed)
HPI [name] comes in for close followup today for her multiple cardiac issues. Please refer to my previous notes.  She has no further nausea off Lipitor. Her weight is increasing as is her appetite. She is stronger. She is now R. dizzy with standing.  She denies orthopnea, PND or significant edema.  She's very compliant with her medications.  Past Medical History  Diagnosis Date  . Type II or unspecified type diabetes mellitus without mention of complication, not stated as uncontrolled   . Unspecified hemorrhoids without mention of complication   . Depression   . CAD (coronary artery disease)     s/p PCI to LAD '08, Severe 3V CAD by cath 03/27/12 --> CABG  . Mitral regurgitation     moderate by cath, mild by echo 03/2012  . Persistent atrial fibrillation   . Osteoarthrosis, unspecified whether generalized or localized, unspecified site   . Personal history of other diseases of digestive system   . Unspecified glaucoma   . Unspecified essential hypertension   . Thyroid disease     hypothyroidism  . CHF (congestive heart failure)     Current Outpatient Prescriptions  Medication Sig Dispense Refill  . aspirin 81 MG EC tablet Take 1 tablet (81 mg total) by mouth daily.  30 tablet    . carvedilol (COREG) 25 MG tablet Take 25 mg by mouth 2 (two) times daily with a meal.      . digoxin (LANOXIN) 0.0625 mg TABS Take 0.5 tablets (62.5 mcg total) by mouth daily.      [name] FLUoxetine (PROZAC) 20 MG tablet Take 20 mg by mouth daily.      . furosemide (LASIX) 40 MG tablet Take 40 mg by mouth daily.      [name] omeprazole (PRILOSEC) 20 MG capsule Take 20 mg by mouth daily.      . potassium chloride SA (K-DUR,KLOR-CON) 20 MEQ tablet Take 20 mEq by mouth daily.       . sitaGLIPtan-metformin (JANUMET) 50-500 MG per tablet Take 1 tablet by mouth 2 (two) times daily with a meal.      . warfarin (COUMADIN) 5 MG tablet Take 1 tablet (5 mg total) by mouth daily.  30 tablet  3    Allergies  Allergen  Reactions  . Lisinopril Anaphylaxis  . Quinine Other (See Comments)    Pt states heart attack-like symptoms  . Sulfonamide Derivatives Other (See Comments)    unknown    No family history on file.  History   Social History  . Marital Status: Married    Spouse Name: N/A    Number of Children: N/A  . Years of Education: N/A   Occupational History  . Not on file.   Social History Main Topics  . Smoking status: Former Games developer  . Smokeless tobacco: Never Used  . Alcohol Use: No  . Drug Use: No  . Sexually Active: Not on file   Other Topics Concern  . Not on file   Social History Narrative  . No narrative on file    ROS ALL NEGATIVE EXCEPT THOSE NOTED IN HPI  PE  General Appearance: well developed, well nourished in no acute distress, looks stronger HEENT: symmetrical face, PERRLA, good dentition  Neck: no JVD, thyromegaly, or adenopathy, trachea midline Chest: symmetric without deformity Cardiac: PMI non-displaced, RRR, normal S1, S2, no gallop, soft systolic murmur at the apex Lung: clear to ausculation and percussion Vascular: all pulses full without bruits  Abdominal: nondistended, nontender, good  bowel sounds, no HSM, no bruits Extremities: no cyanosis, clubbing or edema, no sign of DVT, no varicosities  Skin: normal color, no rashes Neuro: alert and oriented x 3, non-focal Pysch: normal affect  EKG  BMET    Component Value Date/Time   NA 134* 05/10/2012 1007   K 4.6 05/10/2012 1007   CL 98 05/10/2012 1007   CO2 27 05/10/2012 1007   GLUCOSE 189* 05/10/2012 1007   BUN 17 05/10/2012 1007   CREATININE 0.9 05/10/2012 1007   CALCIUM 9.2 05/10/2012 1007   GFRNONAA 81* 04/16/2012 0500   GFRAA >90 04/16/2012 0500    Lipid Panel     Component Value Date/Time   CHOL 149 03/29/2012 0515   TRIG 121 03/29/2012 0515   HDL 36* 03/29/2012 0515   CHOLHDL 4.1 03/29/2012 0515   VLDL 24 03/29/2012 0515   LDLCALC 89 03/29/2012 0515    CBC    Component Value Date/Time   WBC 8.0  05/25/2012 1255   RBC 3.94 05/25/2012 1255   HGB 10.4* 05/25/2012 1255   HCT 32.6* 05/25/2012 1255   PLT 362 05/25/2012 1255   MCV 82.7 05/25/2012 1255   MCH 26.4 05/25/2012 1255   MCHC 31.9 05/25/2012 1255   RDW 17.6* 05/25/2012 1255   LYMPHSABS 2.2 05/25/2012 1255   MONOABS 0.8 05/25/2012 1255   EOSABS 0.1 05/25/2012 1255   BASOSABS 0.0 05/25/2012 1255

## 2012-07-18 ENCOUNTER — Encounter: Payer: Self-pay | Admitting: *Deleted

## 2012-07-31 ENCOUNTER — Ambulatory Visit (INDEPENDENT_AMBULATORY_CARE_PROVIDER_SITE_OTHER): Payer: Medicare Other | Admitting: *Deleted

## 2012-07-31 ENCOUNTER — Other Ambulatory Visit: Payer: Self-pay | Admitting: *Deleted

## 2012-07-31 DIAGNOSIS — I4891 Unspecified atrial fibrillation: Secondary | ICD-10-CM

## 2012-07-31 DIAGNOSIS — Z9889 Other specified postprocedural states: Secondary | ICD-10-CM

## 2012-07-31 DIAGNOSIS — E785 Hyperlipidemia, unspecified: Secondary | ICD-10-CM

## 2012-07-31 DIAGNOSIS — Z7901 Long term (current) use of anticoagulants: Secondary | ICD-10-CM

## 2012-07-31 DIAGNOSIS — Z951 Presence of aortocoronary bypass graft: Secondary | ICD-10-CM

## 2012-07-31 DIAGNOSIS — I2510 Atherosclerotic heart disease of native coronary artery without angina pectoris: Secondary | ICD-10-CM

## 2012-07-31 DIAGNOSIS — I482 Chronic atrial fibrillation, unspecified: Secondary | ICD-10-CM

## 2012-07-31 LAB — POCT INR: INR: 1.3

## 2012-08-14 ENCOUNTER — Ambulatory Visit (INDEPENDENT_AMBULATORY_CARE_PROVIDER_SITE_OTHER): Payer: Medicare Other | Admitting: *Deleted

## 2012-08-14 DIAGNOSIS — I482 Chronic atrial fibrillation, unspecified: Secondary | ICD-10-CM

## 2012-08-14 DIAGNOSIS — Z9889 Other specified postprocedural states: Secondary | ICD-10-CM

## 2012-08-14 DIAGNOSIS — Z7901 Long term (current) use of anticoagulants: Secondary | ICD-10-CM

## 2012-08-14 DIAGNOSIS — I4891 Unspecified atrial fibrillation: Secondary | ICD-10-CM

## 2012-08-25 ENCOUNTER — Other Ambulatory Visit: Payer: Self-pay | Admitting: *Deleted

## 2012-08-25 DIAGNOSIS — E782 Mixed hyperlipidemia: Secondary | ICD-10-CM

## 2012-08-30 LAB — DIGOXIN LEVEL: Digoxin Level: 0.6 ng/mL — ABNORMAL LOW (ref 0.8–2.0)

## 2012-08-31 ENCOUNTER — Ambulatory Visit (INDEPENDENT_AMBULATORY_CARE_PROVIDER_SITE_OTHER): Payer: Medicare Other | Admitting: *Deleted

## 2012-08-31 DIAGNOSIS — Z9889 Other specified postprocedural states: Secondary | ICD-10-CM

## 2012-08-31 DIAGNOSIS — I4891 Unspecified atrial fibrillation: Secondary | ICD-10-CM

## 2012-08-31 DIAGNOSIS — I482 Chronic atrial fibrillation, unspecified: Secondary | ICD-10-CM

## 2012-08-31 DIAGNOSIS — Z7901 Long term (current) use of anticoagulants: Secondary | ICD-10-CM

## 2012-09-05 ENCOUNTER — Encounter: Payer: Self-pay | Admitting: Adult Health

## 2012-09-05 ENCOUNTER — Ambulatory Visit (INDEPENDENT_AMBULATORY_CARE_PROVIDER_SITE_OTHER): Payer: Medicare Other | Admitting: Adult Health

## 2012-09-05 ENCOUNTER — Ambulatory Visit: Payer: PRIVATE HEALTH INSURANCE | Admitting: Adult Health

## 2012-09-05 VITALS — BP 110/70 | HR 76 | Ht 68.0 in | Wt 146.5 lb

## 2012-09-05 DIAGNOSIS — I5022 Chronic systolic (congestive) heart failure: Secondary | ICD-10-CM

## 2012-09-05 DIAGNOSIS — I482 Chronic atrial fibrillation, unspecified: Secondary | ICD-10-CM

## 2012-09-05 DIAGNOSIS — I4891 Unspecified atrial fibrillation: Secondary | ICD-10-CM

## 2012-09-05 DIAGNOSIS — E785 Hyperlipidemia, unspecified: Secondary | ICD-10-CM

## 2012-09-05 NOTE — Patient Instructions (Signed)
Your physician wants you to follow-up in: 3 months with Dr. Daleen Squibb.  You will receive a reminder letter in the mail two months in advance. If you don't receive a letter, please call our office to schedule the follow-up appointment.  Hold Pravastatin for 2 weeks and report back on symptoms.

## 2012-09-05 NOTE — Assessment & Plan Note (Signed)
Review of most recent echocardiogram in April 2013 reveals an EF of 45%. CT is on Lasix 40 mg daily carvedilol 25 mg twice a day. She is not on hydralazine or in door at this time. She complains of dizziness and lightheadedness when standing up. Orthostatic blood pressures were completed here in the office with a lying blood pressure 121/68 with a heart rate of 77, sitting 110/66 with a heart rate is 78, standing 110/63 with a heart rate of 79. I have advised her that she can decrease her Lasix to 20 mg daily, but continue to weigh daily. If she gains 2-3 pounds in 1 to 2 days or 5 pounds in a week, she will need to go back up on her Lasix at 40 mg daily. I trust that she will be very meticulous about this as she has been submitted documentation of weight, blood sugars, and blood pressures. She will see Dr. wall in 3 months, at which time he may decide to repeat her echocardiogram for evaluation of LV function improvement.

## 2012-09-05 NOTE — Assessment & Plan Note (Signed)
Heart rate is well-controlled on current medication regimen. Digoxin level was ordered and was found to be 0.06. She will continue her digoxin as directed. She will continue Coumadin as directed taking 5 mg daily with dosing adjusted per INR. He is to report any bleeding symptoms.

## 2012-09-05 NOTE — Assessment & Plan Note (Signed)
Review of her labs that were drawn on 09/02/2012 was a total cholesterol of 179, HDL of 29, LDL of 101, her triglycerides were elevated. She stated she is having some aches and pains in her shoulders and arms which had started with the use of the Pravachol she is taking. I asked her stop taking the Pravachol for 2 weeks to see if symptoms subside. If they do she is to call his so we can provide an alternative statin. She will need this protection in the setting of CAD.

## 2012-09-05 NOTE — Progress Notes (Signed)
HPI [name] is a 78 y/o patient of we are seeing post hospital with history of CAD, s/p CABG after admission for ACS, severe 3 vessel disease, MVR. She had CABG on 04/04/2011 LIMA to LAD, SVG to Ramus, SVG to OM, SVG to PDA. She also had mitral valve annuloplasty repair and anterior leaflet plasty for ischemic mitral regurgitation. She had post operative anemia requiring blood transfusion, atrial fib, intolerant to amiodarone, and therefore placed on digoxin and diltiazem. She is on coumadin.     She states she is feeling much  better every day after being seen last by Dr.Wall in July of 2013. She continues to regain her strength slowly but it is not as active as she likes to be. She has periods where she is just completely worn out for the day and has to rest. It happens every now and then. She is very compliant with her medications who weighs herself daily and records her blood pressure. She rates with her her recordings with her blood pressures in the high 80s and low 90s systolically. She complains of dizziness with position change.   Allergies  Allergen Reactions  . Lisinopril Anaphylaxis  . Quinine Other (See Comments)    Pt states heart attack-like symptoms  . Sulfonamide Derivatives Other (See Comments)    unknown    Current Outpatient Prescriptions  Medication Sig Dispense Refill  . aspirin 81 MG EC tablet Take 1 tablet (81 mg total) by mouth daily.  30 tablet    . carvedilol (COREG) 25 MG tablet Take 25 mg by mouth 2 (two) times daily with a meal.      . digoxin (LANOXIN) 0.0625 mg TABS Take 0.5 tablets (62.5 mcg total) by mouth daily.      [name] FLUoxetine (PROZAC) 20 MG tablet Take 20 mg by mouth daily.      . furosemide (LASIX) 40 MG tablet Take 40 mg by mouth daily.      [name] omeprazole (PRILOSEC) 20 MG capsule Take 20 mg by mouth daily.      . potassium chloride SA (K-DUR,KLOR-CON) 20 MEQ tablet Take 20 mEq by mouth daily.       . pravastatin (PRAVACHOL) 40 MG tablet Take 1 tablet  (40 mg total) by mouth every evening.  90 tablet  3  . sitaGLIPtan-metformin (JANUMET) 50-500 MG per tablet Take 1 tablet by mouth 2 (two) times daily with a meal.      . warfarin (COUMADIN) 5 MG tablet Take 1 tablet (5 mg total) by mouth daily.  30 tablet  3    Past Medical History  Diagnosis Date  . Type II or unspecified type diabetes mellitus without mention of complication, not stated as uncontrolled   . Unspecified hemorrhoids without mention of complication   . Depression   . CAD (coronary artery disease)     s/p PCI to LAD '08, Severe 3V CAD by cath 03/27/12 --> CABG  . Mitral regurgitation     moderate by cath, mild by echo 03/2012  . Persistent atrial fibrillation   . Osteoarthrosis, unspecified whether generalized or localized, unspecified site   . Personal history of other diseases of digestive system   . Unspecified glaucoma   . Unspecified essential hypertension   . Thyroid disease     hypothyroidism  . CHF (congestive heart failure)     Past Surgical History  Procedure Date  . Coronary artery bypass graft 04/03/2012    Procedure: CORONARY ARTERY BYPASS GRAFTING (CABG);  Surgeon: Kerin Perna, MD;  Location: [facility] OR;  Service: Open Heart Surgery;  Laterality: N/A;  CABG x four;  using left internal mammary artery and bilateral greater saphenous veins  . Mitral valve repair 04/03/2012    Procedure: MITRAL VALVE REPAIR (MVR);  Surgeon: Kerin Perna, MD;  Location: [facility] OR;  Service: Open Heart Surgery;  Laterality: N/A;  . Ercp 2008  . Cholecystectomy 12/2006    RUE:AVWUJW of systems complete and found to be negative unless listed above  PHYSICAL EXAM BP 110/70  Pulse 76  Ht 5\' 8"  (1.727 m)  Wt 146 lb 8 oz (66.452 kg)  BMI 22.28 kg/m2  General: Well developed, well nourished, in no acute distress Head: Eyes PERRLA, No xanthomas.   Normal cephalic and atramatic  Lungs: Clear bilaterally to auscultation and percussion. Heart: HRRR S1 S2, without MRG. Distant heart  sounds  Pulses are 2+ & equal.            No carotid bruit. No JVD.  No abdominal bruits. No femoral bruits. Abdomen: Bowel sounds are positive, abdomen soft and non-tender without masses or                  Hernia's noted. Msk:  Back normal, normal gait. Normal strength and tone for age. Extremities: No clubbing, cyanosis or edema.  DP +1 Neuro: Alert and oriented X 3. Psych:  Good affect, responds appropriately    ASSESSMENT AND PLAN

## 2012-09-07 ENCOUNTER — Encounter: Payer: Self-pay | Admitting: Adult Health

## 2012-09-08 ENCOUNTER — Encounter: Payer: Self-pay | Admitting: Cardiology

## 2012-09-08 ENCOUNTER — Other Ambulatory Visit: Payer: Self-pay | Admitting: *Deleted

## 2012-09-08 ENCOUNTER — Encounter: Payer: Self-pay | Admitting: *Deleted

## 2012-09-08 DIAGNOSIS — E782 Mixed hyperlipidemia: Secondary | ICD-10-CM

## 2012-09-14 ENCOUNTER — Encounter: Payer: Self-pay | Admitting: Cardiology

## 2012-09-14 ENCOUNTER — Ambulatory Visit (INDEPENDENT_AMBULATORY_CARE_PROVIDER_SITE_OTHER): Payer: Medicare Other | Admitting: *Deleted

## 2012-09-14 DIAGNOSIS — Z7901 Long term (current) use of anticoagulants: Secondary | ICD-10-CM

## 2012-09-14 DIAGNOSIS — Z9889 Other specified postprocedural states: Secondary | ICD-10-CM

## 2012-09-14 DIAGNOSIS — I4891 Unspecified atrial fibrillation: Secondary | ICD-10-CM

## 2012-09-14 DIAGNOSIS — I482 Chronic atrial fibrillation, unspecified: Secondary | ICD-10-CM

## 2012-09-19 NOTE — Progress Notes (Signed)
Patient ID: [name], female   DOB: September 14, 1932, 78 y.o.   MRN: [id number]

## 2012-10-05 ENCOUNTER — Ambulatory Visit (INDEPENDENT_AMBULATORY_CARE_PROVIDER_SITE_OTHER): Payer: Medicare Other | Admitting: *Deleted

## 2012-10-05 DIAGNOSIS — I482 Chronic atrial fibrillation, unspecified: Secondary | ICD-10-CM

## 2012-10-05 DIAGNOSIS — Z7901 Long term (current) use of anticoagulants: Secondary | ICD-10-CM

## 2012-10-05 DIAGNOSIS — Z9889 Other specified postprocedural states: Secondary | ICD-10-CM

## 2012-10-05 DIAGNOSIS — I4891 Unspecified atrial fibrillation: Secondary | ICD-10-CM

## 2012-11-02 ENCOUNTER — Ambulatory Visit (INDEPENDENT_AMBULATORY_CARE_PROVIDER_SITE_OTHER): Payer: Medicare Other | Admitting: *Deleted

## 2012-11-02 DIAGNOSIS — Z9889 Other specified postprocedural states: Secondary | ICD-10-CM

## 2012-11-02 DIAGNOSIS — Z7901 Long term (current) use of anticoagulants: Secondary | ICD-10-CM

## 2012-11-02 DIAGNOSIS — I482 Chronic atrial fibrillation, unspecified: Secondary | ICD-10-CM

## 2012-11-02 DIAGNOSIS — I4891 Unspecified atrial fibrillation: Secondary | ICD-10-CM

## 2012-11-07 ENCOUNTER — Other Ambulatory Visit: Payer: Self-pay | Admitting: *Deleted

## 2012-11-07 MED ORDER — WARFARIN SODIUM 5 MG PO TABS: 5.0000 mg | ORAL_TABLET | Freq: Every day | ORAL | Status: DC

## 2012-11-30 ENCOUNTER — Ambulatory Visit (INDEPENDENT_AMBULATORY_CARE_PROVIDER_SITE_OTHER): Payer: Medicare Other | Admitting: *Deleted

## 2012-11-30 DIAGNOSIS — Z9889 Other specified postprocedural states: Secondary | ICD-10-CM

## 2012-11-30 DIAGNOSIS — Z7901 Long term (current) use of anticoagulants: Secondary | ICD-10-CM

## 2012-11-30 DIAGNOSIS — I482 Chronic atrial fibrillation, unspecified: Secondary | ICD-10-CM

## 2012-11-30 DIAGNOSIS — I4891 Unspecified atrial fibrillation: Secondary | ICD-10-CM

## 2012-12-02 IMAGING — CR DG CHEST 1V PORT
1 series · 1 of 1 positions shown · non-contrast
Comparison: Chest radiograph 04/08/2012

CLINICAL DATA: CABG

PORTABLE CHEST - 1 VIEW

[view not recorded]
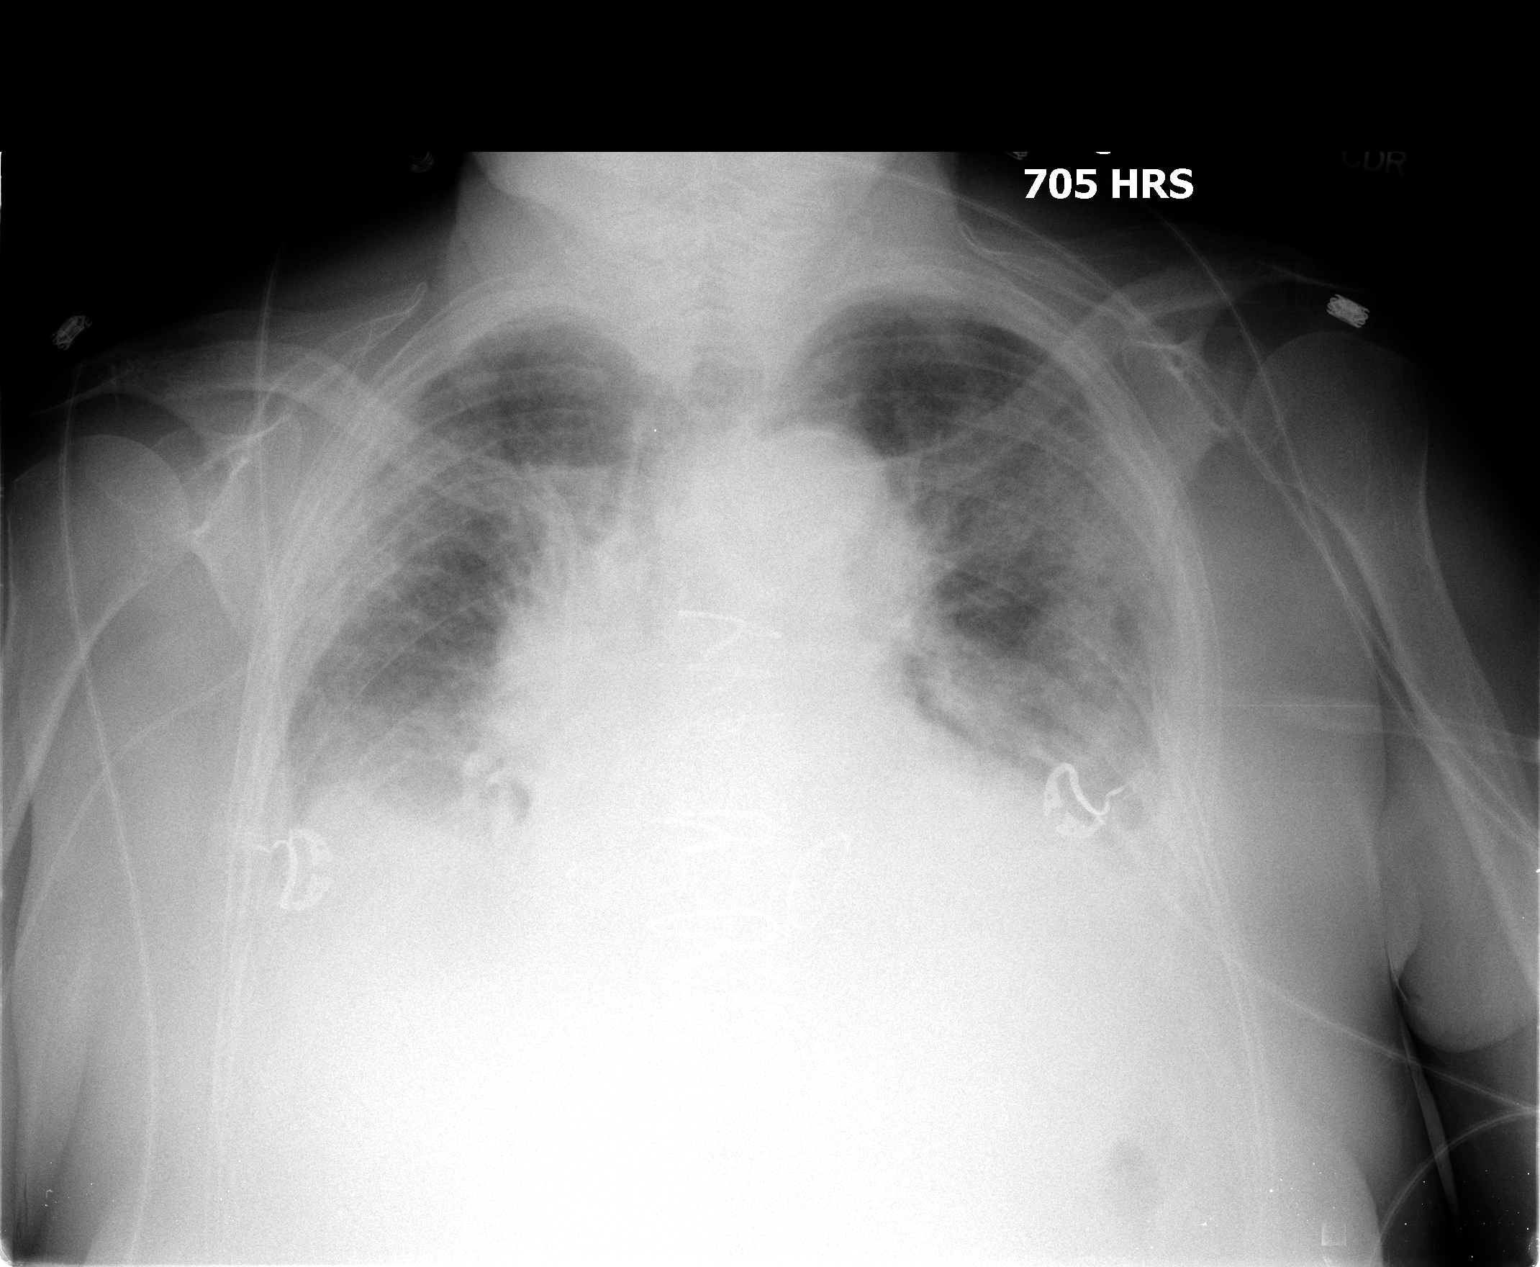

[1 of 1 positions shown; findings below may reference images not displayed]

FINDINGS: Sternal wires overlie stable enlarged heart silhouette.
There is decreased lung volumes compared to prior.  There is
perihilar air space disease which is not improved.  Bilateral
pleural effusions. No pneumothorax.
IMPRESSION: 1..  Decrease in lung volumes appear to prior.
2.  Bilateral pleural effusion may be slightly increased.
3.  No improvement in perihilar air space disease.  Findings
suggest congestive heart failure.

## 2012-12-03 IMAGING — CR DG CHEST 1V PORT
1 series · 1 of 1 positions shown · non-contrast
Comparison: Portable exam 7843 hours compared to 04/09/2012

CLINICAL DATA: Post heart surgery

PORTABLE CHEST - 1 VIEW

[AP]
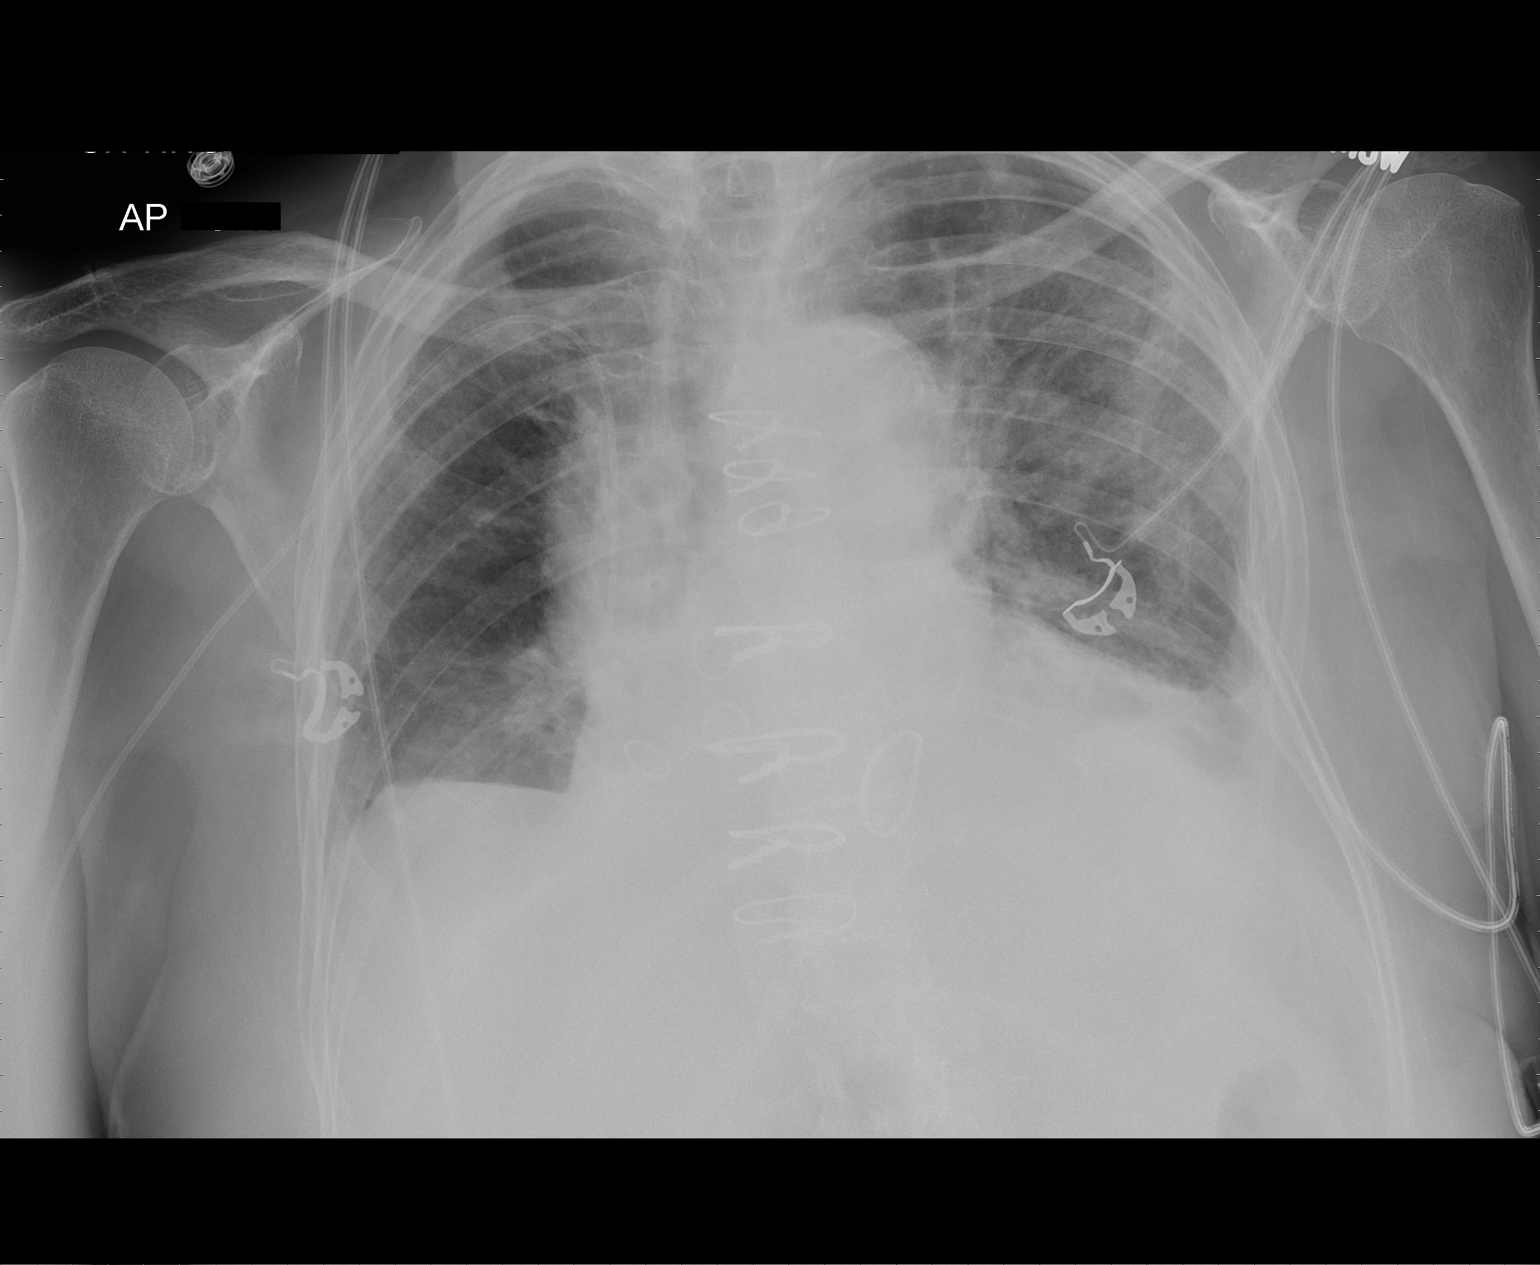

[1 of 1 positions shown; findings below may reference images not displayed]

FINDINGS: Right arm PICC line, tip projecting over SVC near cavoatrial
junction.
Enlargement of cardiac silhouette post CABG and MVR.
Pulmonary vascular congestion.
Slightly improved pulmonary edema, most persistent in the left
upper lobe.
Persistent left basilar atelectasis and small effusion.
No pneumothorax.
Bones diffusely demineralized.
IMPRESSION: Slightly improved pulmonary edema.

## 2012-12-04 IMAGING — CR DG CHEST 2V
2 series · 2 of 2 positions shown · non-contrast
Comparison: 04/10/2012; 04/09/2012; 04/08/2012

CLINICAL DATA: Evaluate CHF, shortness of breath

CHEST - 2 VIEW

[w chest pa]
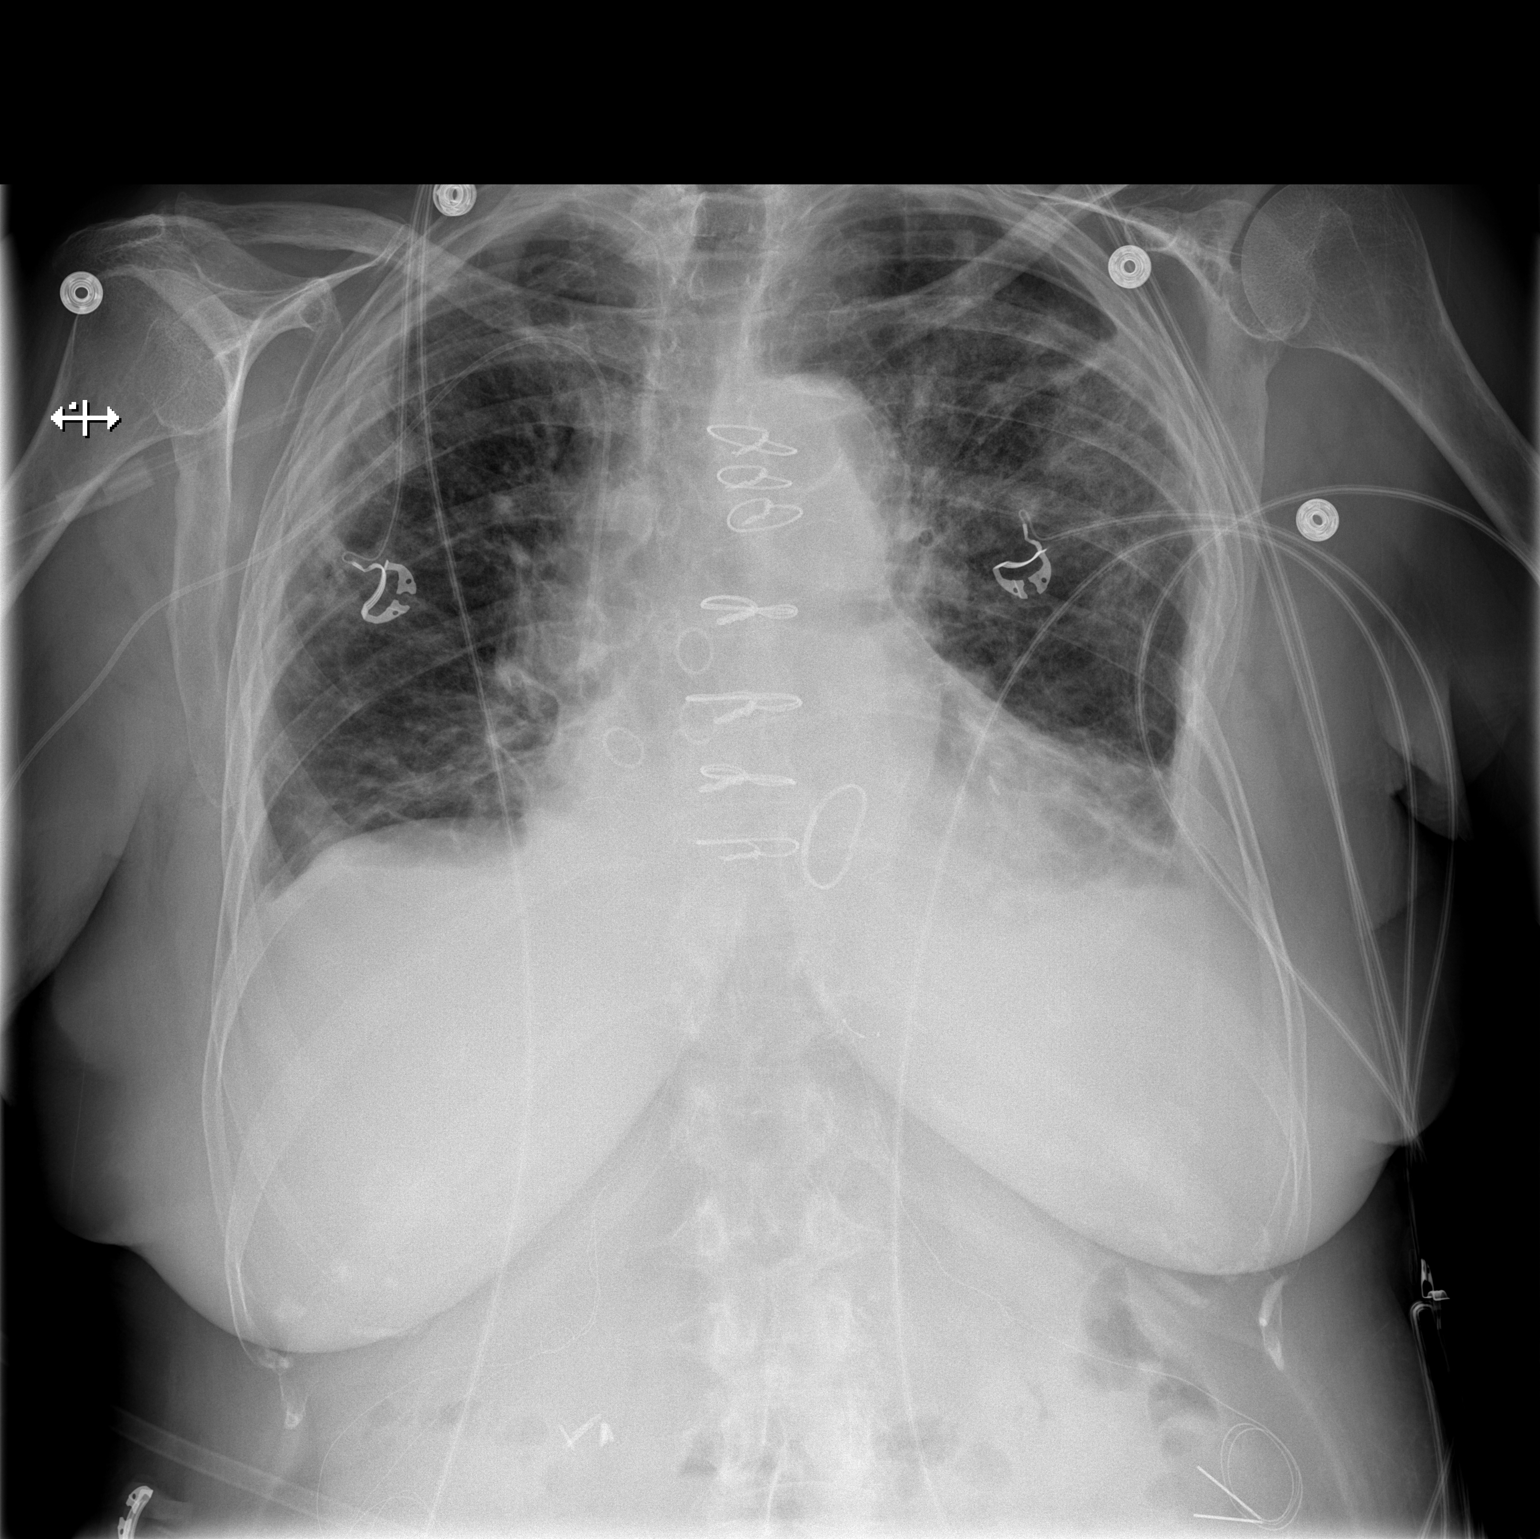

[w chest lat]
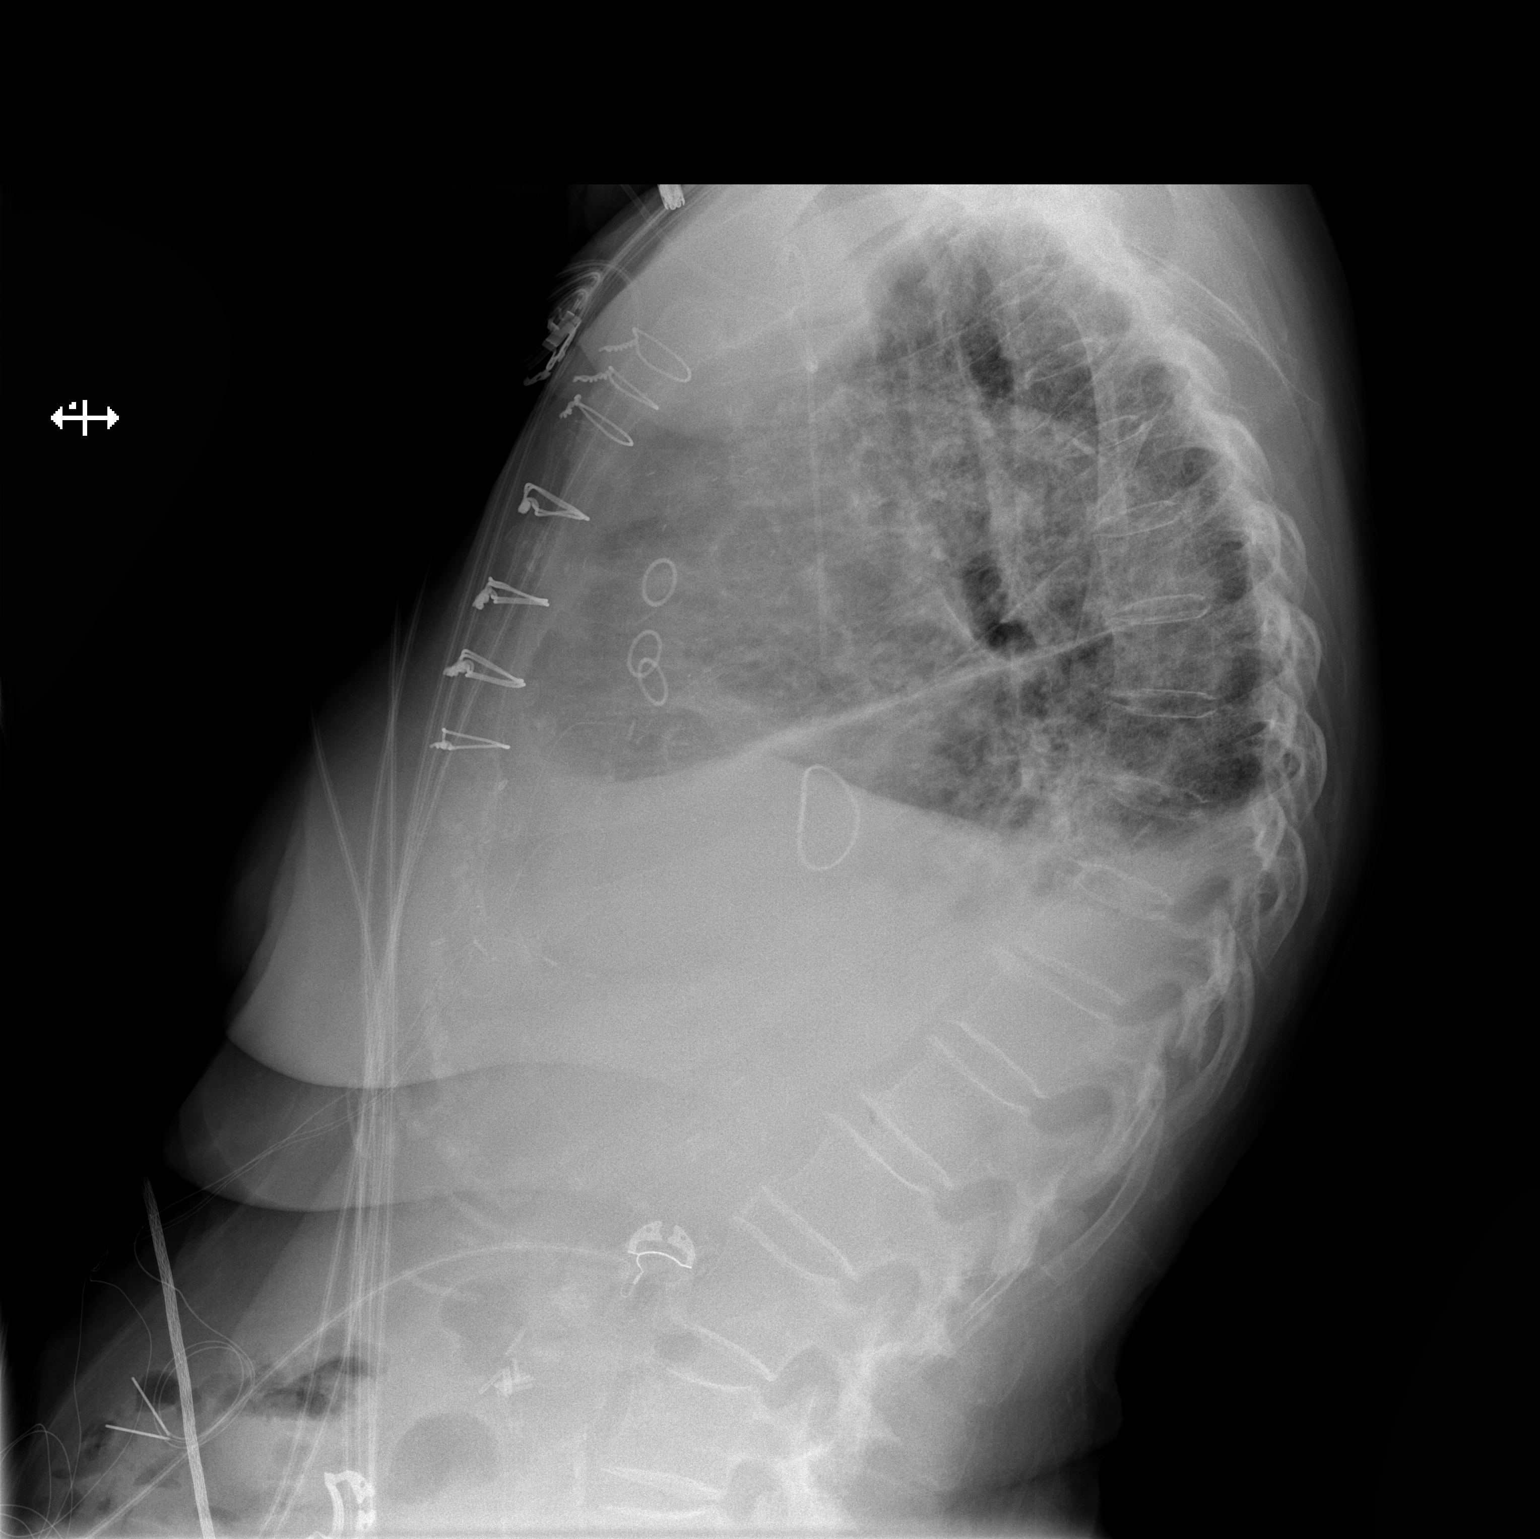

[2 of 2 positions shown; findings below may reference images not displayed]

FINDINGS: Unchanged borderline enlarged cardiac silhouette and
mediastinal contours and median sternotomy, CABG and valve
replacement/repair.  Stable position of support apparatus.  Grossly
unchanged small bilateral effusions and bibasilar opacities.
Suspect hyperinflated lungs with persistent upper lung and
peripheral predominant heterogeneous opacities.  Cephalization of
flow.  Vascular calcifications.  Grossly unchanged bones including
accentuated thoracic kyphosis.  Post cholecystectomy.
IMPRESSION: 1.  Grossly unchanged findings of small bilateral effusions and
bibasilar opacities favored to represent atelectasis.
2.  Suspect hyperinflated lungs with superimposed pulmonary edema,
though note, atypical infection may have a similar appearance.

## 2012-12-13 ENCOUNTER — Ambulatory Visit: Payer: Medicare Other | Admitting: Cardiology

## 2012-12-22 IMAGING — CR DG CHEST 2V
2 series · 2 of 2 positions shown · non-contrast
Comparison: April 19, 2012

CLINICAL DATA: Congestion, cough

CHEST - 2 VIEW

[view not recorded (1 of 2)]
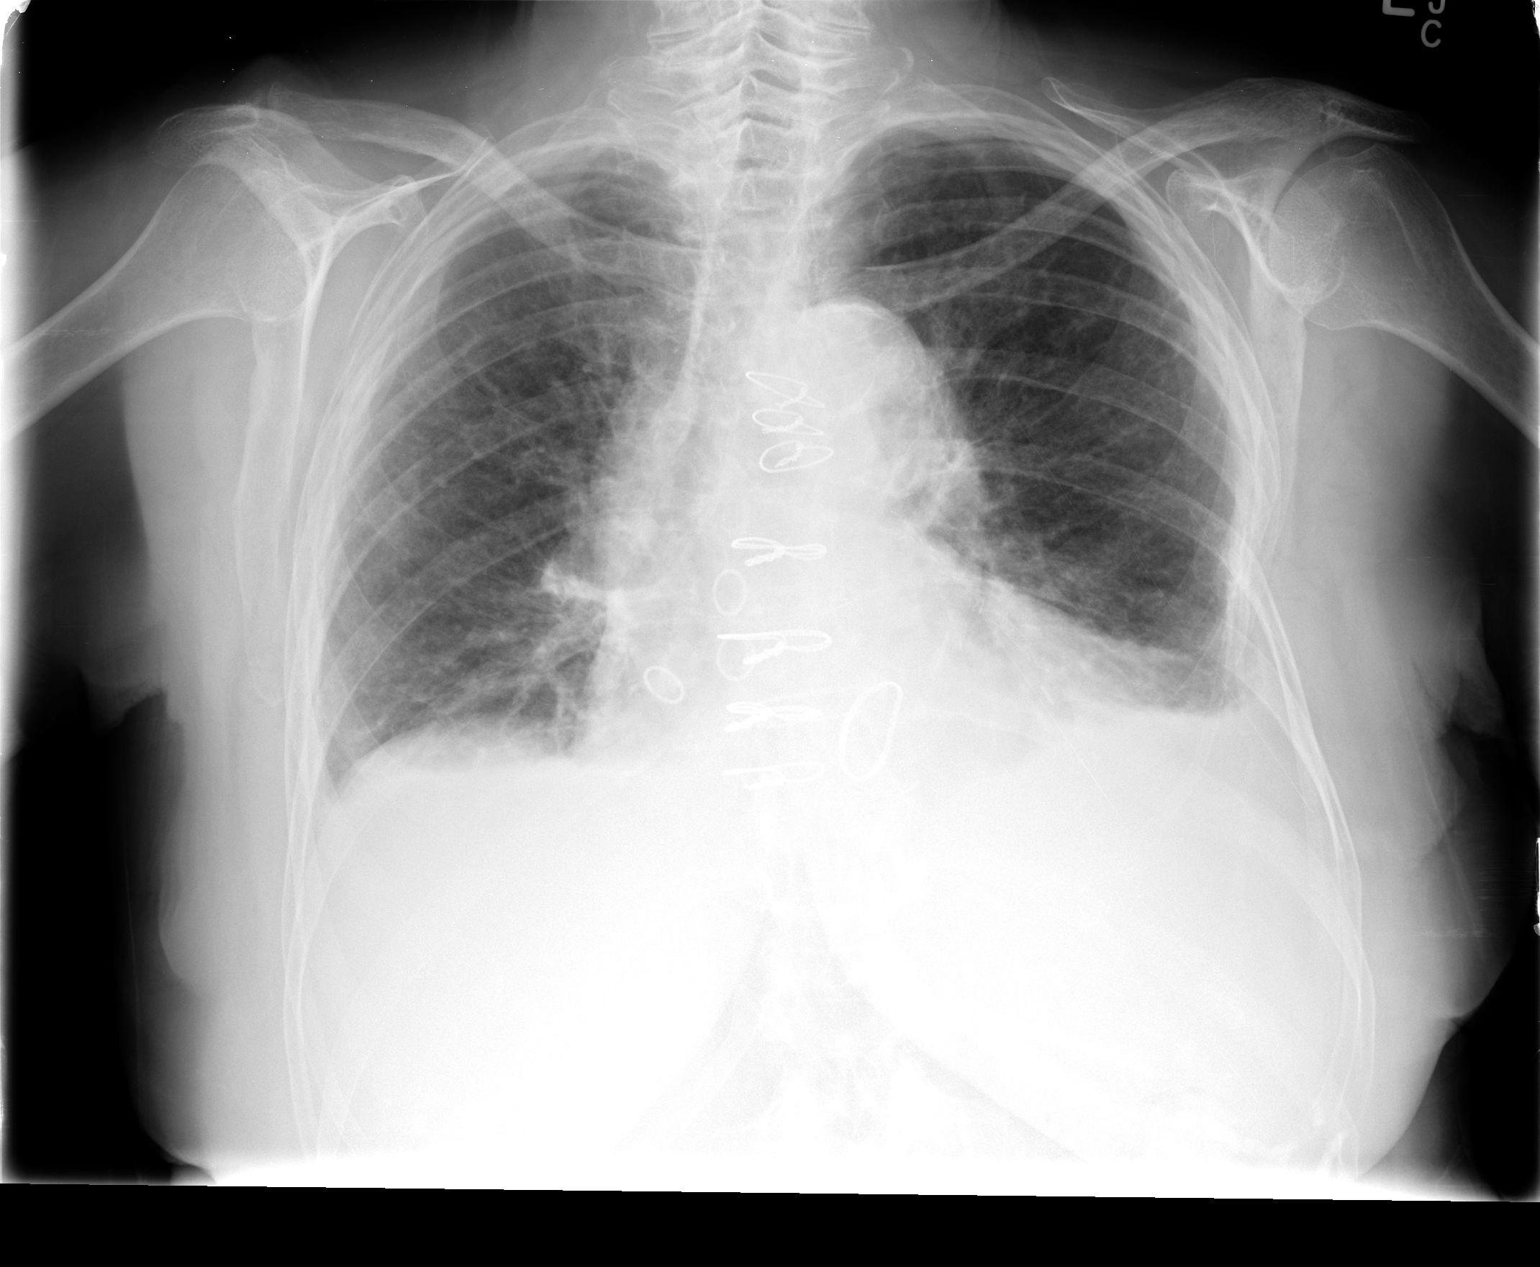

[view not recorded (2 of 2)]
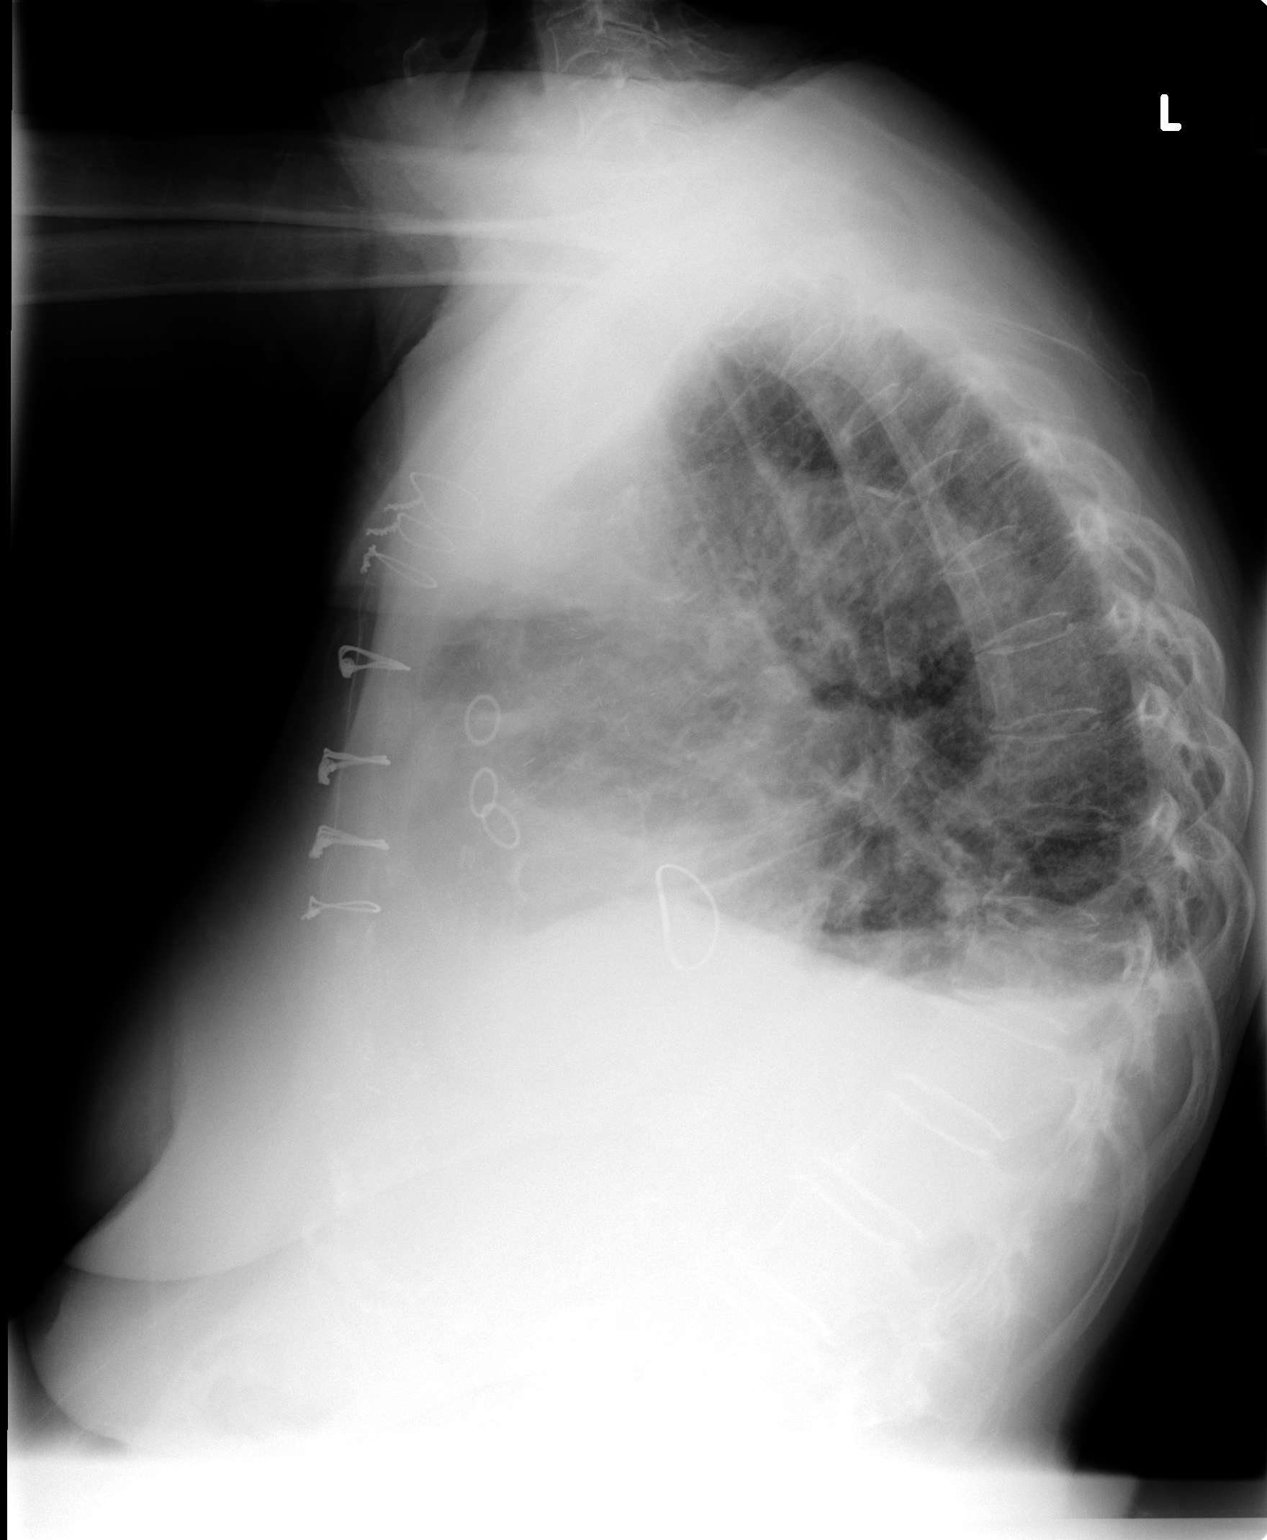

[2 of 2 positions shown; findings below may reference images not displayed]

FINDINGS: Small pleural effusions, left greater than right, persist
and do not appear changed.  Bibasilar atelectasis also remains
present.  Mild cardiomegaly is unchanged.  The mediastinum and
pulmonary vasculature are within normal limits.
IMPRESSION: No significant change in appearance of small bilateral pleural
effusions and bibasilar atelectasis.

## 2013-01-10 ENCOUNTER — Ambulatory Visit (INDEPENDENT_AMBULATORY_CARE_PROVIDER_SITE_OTHER): Payer: Medicare Other | Admitting: Cardiology

## 2013-01-10 ENCOUNTER — Ambulatory Visit (INDEPENDENT_AMBULATORY_CARE_PROVIDER_SITE_OTHER): Payer: Medicare Other | Admitting: *Deleted

## 2013-01-10 ENCOUNTER — Encounter: Payer: Self-pay | Admitting: Cardiology

## 2013-01-10 VITALS — BP 110/79 | HR 77 | Ht 68.0 in | Wt 143.0 lb

## 2013-01-10 DIAGNOSIS — Z7901 Long term (current) use of anticoagulants: Secondary | ICD-10-CM

## 2013-01-10 DIAGNOSIS — I482 Chronic atrial fibrillation, unspecified: Secondary | ICD-10-CM

## 2013-01-10 DIAGNOSIS — I2510 Atherosclerotic heart disease of native coronary artery without angina pectoris: Secondary | ICD-10-CM

## 2013-01-10 DIAGNOSIS — I252 Old myocardial infarction: Secondary | ICD-10-CM

## 2013-01-10 DIAGNOSIS — Z951 Presence of aortocoronary bypass graft: Secondary | ICD-10-CM

## 2013-01-10 DIAGNOSIS — I10 Essential (primary) hypertension: Secondary | ICD-10-CM

## 2013-01-10 DIAGNOSIS — E785 Hyperlipidemia, unspecified: Secondary | ICD-10-CM

## 2013-01-10 DIAGNOSIS — I4891 Unspecified atrial fibrillation: Secondary | ICD-10-CM

## 2013-01-10 DIAGNOSIS — I5022 Chronic systolic (congestive) heart failure: Secondary | ICD-10-CM

## 2013-01-10 DIAGNOSIS — Z9889 Other specified postprocedural states: Secondary | ICD-10-CM

## 2013-01-10 NOTE — Patient Instructions (Addendum)
Your physician recommends that you schedule a follow-up appointment in: 1 year  

## 2013-01-10 NOTE — Progress Notes (Signed)
HPI  [name] comes in today with her daughter for evaluation and management of her history of coronary artery disease, status post chronic bypass grafting last April, mild to moderate mitral regurgitation, and a history of paroxysmal atrial fibrillation.  She's doing remarkably well with no symptoms of angina, palpitations, presyncope or syncope, no falls, no orthopnea or edema.  Past Medical History  Diagnosis Date  . Type II or unspecified type diabetes mellitus without mention of complication, not stated as uncontrolled   . Unspecified hemorrhoids without mention of complication   . Depression   . CAD (coronary artery disease)     s/p PCI to LAD '08, Severe 3V CAD by cath 03/27/12 --> CABG  . Mitral regurgitation     moderate by cath, mild by echo 03/2012  . Persistent atrial fibrillation   . Osteoarthrosis, unspecified whether generalized or localized, unspecified site   . Personal history of other diseases of digestive system   . Unspecified glaucoma(365.9)   . Unspecified essential hypertension   . Thyroid disease     hypothyroidism  . CHF (congestive heart failure)     Current Outpatient Prescriptions  Medication Sig Dispense Refill  . aspirin 81 MG EC tablet Take 1 tablet (81 mg total) by mouth daily.  30 tablet    . carvedilol (COREG) 25 MG tablet Take 25 mg by mouth 2 (two) times daily with a meal.      . digoxin (LANOXIN) 0.0625 mg TABS Take 0.5 tablets (62.5 mcg total) by mouth daily.      [name] FLUoxetine (PROZAC) 20 MG tablet Take 20 mg by mouth daily.      . furosemide (LASIX) 40 MG tablet Take 20 mg by mouth daily.       [name] omeprazole (PRILOSEC) 20 MG capsule Take 20 mg by mouth daily.      . potassium chloride SA (K-DUR,KLOR-CON) 20 MEQ tablet Take 20 mEq by mouth daily.       . pravastatin (PRAVACHOL) 40 MG tablet Take 1 tablet (40 mg total) by mouth every evening.  90 tablet  3  . sitaGLIPtan-metformin (JANUMET) 50-500 MG per tablet Take 1 tablet by mouth 2 (two)  times daily with a meal.      . warfarin (COUMADIN) 5 MG tablet Take 1 tablet (5 mg total) by mouth daily.  90 tablet  0    Allergies  Allergen Reactions  . Lisinopril Anaphylaxis  . Quinine Other (See Comments)    Pt states heart attack-like symptoms  . Sulfonamide Derivatives Other (See Comments)    unknown    No family history on file.  History   Social History  . Marital Status: Married    Spouse Name: N/A    Number of Children: N/A  . Years of Education: N/A   Occupational History  . Not on file.   Social History Main Topics  . Smoking status: Former Games developer  . Smokeless tobacco: Never Used  . Alcohol Use: No  . Drug Use: No  . Sexually Active: Not on file   Other Topics Concern  . Not on file   Social History Narrative  . No narrative on file    ROS ALL NEGATIVE EXCEPT THOSE NOTED IN HPI  PE  General Appearance: well developed, well nourished in no acute distress, looks 89 and stated age HEENT: symmetrical face, PERRLA, good dentition  Neck: no JVD, thyromegaly, or adenopathy, trachea midline Chest: symmetric without deformity Cardiac: PMI non-displaced, RRR, normal S1, S2,  no gallop or murmur Lung: clear to ausculation and percussion Vascular: all pulses full without bruits  Abdominal: nondistended, nontender, good bowel sounds, no HSM, no bruits Extremities: no cyanosis, clubbing or edema, no sign of DVT, no varicosities  Skin: normal color, no rashes Neuro: alert and oriented x 3, non-focal Pysch: normal affect  EKG Normal sinus rhythm, poor R-wave progression anterior chordae him. No acute changes. BMET    Component Value Date/Time   NA 134* 05/10/2012 1007   K 4.6 05/10/2012 1007   CL 98 05/10/2012 1007   CO2 27 05/10/2012 1007   GLUCOSE 189* 05/10/2012 1007   BUN 17 05/10/2012 1007   CREATININE 0.9 05/10/2012 1007   CALCIUM 9.2 05/10/2012 1007   GFRNONAA 81* 04/16/2012 0500   GFRAA >90 04/16/2012 0500    Lipid Panel     Component Value  Date/Time   CHOL 149 03/29/2012 0515   TRIG 121 03/29/2012 0515   HDL 36* 03/29/2012 0515   CHOLHDL 4.1 03/29/2012 0515   VLDL 24 03/29/2012 0515   LDLCALC 89 03/29/2012 0515    CBC    Component Value Date/Time   WBC 8.0 05/25/2012 1255   RBC 3.94 05/25/2012 1255   HGB 10.4* 05/25/2012 1255   HCT 32.6* 05/25/2012 1255   PLT 362 05/25/2012 1255   MCV 82.7 05/25/2012 1255   MCH 26.4 05/25/2012 1255   MCHC 31.9 05/25/2012 1255   RDW 17.6* 05/25/2012 1255   LYMPHSABS 2.2 05/25/2012 1255   MONOABS 0.8 05/25/2012 1255   EOSABS 0.1 05/25/2012 1255   BASOSABS 0.0 05/25/2012 1255

## 2013-01-10 NOTE — Assessment & Plan Note (Signed)
Doing remarkably well almost one year out from bypass grafting. She has mild mitral regurgitation but no symptoms of heart failure. She is in sinus rhythm today.  Continue current medical program. [name] also continue Coumadin. I'll see her back in a year or when necessary.

## 2013-02-03 ENCOUNTER — Other Ambulatory Visit: Payer: Self-pay | Admitting: Cardiology

## 2013-02-07 ENCOUNTER — Ambulatory Visit (INDEPENDENT_AMBULATORY_CARE_PROVIDER_SITE_OTHER): Payer: Medicare Other | Admitting: *Deleted

## 2013-02-07 DIAGNOSIS — Z7901 Long term (current) use of anticoagulants: Secondary | ICD-10-CM

## 2013-02-07 DIAGNOSIS — I4891 Unspecified atrial fibrillation: Secondary | ICD-10-CM

## 2013-02-07 DIAGNOSIS — Z9889 Other specified postprocedural states: Secondary | ICD-10-CM

## 2013-02-07 DIAGNOSIS — I482 Chronic atrial fibrillation, unspecified: Secondary | ICD-10-CM

## 2013-03-07 ENCOUNTER — Ambulatory Visit (INDEPENDENT_AMBULATORY_CARE_PROVIDER_SITE_OTHER): Payer: Medicare Other | Admitting: *Deleted

## 2013-03-07 DIAGNOSIS — I4891 Unspecified atrial fibrillation: Secondary | ICD-10-CM

## 2013-03-07 DIAGNOSIS — Z7901 Long term (current) use of anticoagulants: Secondary | ICD-10-CM

## 2013-03-07 DIAGNOSIS — Z9889 Other specified postprocedural states: Secondary | ICD-10-CM

## 2013-03-07 DIAGNOSIS — I482 Chronic atrial fibrillation, unspecified: Secondary | ICD-10-CM

## 2013-03-07 LAB — POCT INR: INR: 2.2

## 2013-04-04 ENCOUNTER — Ambulatory Visit (INDEPENDENT_AMBULATORY_CARE_PROVIDER_SITE_OTHER): Payer: Medicare Other | Admitting: *Deleted

## 2013-04-04 DIAGNOSIS — I4891 Unspecified atrial fibrillation: Secondary | ICD-10-CM

## 2013-04-04 DIAGNOSIS — Z7901 Long term (current) use of anticoagulants: Secondary | ICD-10-CM

## 2013-04-04 DIAGNOSIS — Z9889 Other specified postprocedural states: Secondary | ICD-10-CM

## 2013-04-04 DIAGNOSIS — I482 Chronic atrial fibrillation, unspecified: Secondary | ICD-10-CM

## 2013-04-04 LAB — POCT INR: INR: 1.7

## 2013-04-04 MED ORDER — WARFARIN SODIUM 5 MG PO TABS: ORAL_TABLET | ORAL | Status: DC

## 2013-04-23 ENCOUNTER — Ambulatory Visit (INDEPENDENT_AMBULATORY_CARE_PROVIDER_SITE_OTHER): Payer: Medicare Other | Admitting: *Deleted

## 2013-04-23 DIAGNOSIS — I4891 Unspecified atrial fibrillation: Secondary | ICD-10-CM

## 2013-04-23 DIAGNOSIS — I482 Chronic atrial fibrillation, unspecified: Secondary | ICD-10-CM

## 2013-04-23 DIAGNOSIS — Z9889 Other specified postprocedural states: Secondary | ICD-10-CM

## 2013-04-23 DIAGNOSIS — Z7901 Long term (current) use of anticoagulants: Secondary | ICD-10-CM

## 2013-04-27 ENCOUNTER — Other Ambulatory Visit: Payer: Self-pay | Admitting: Cardiology

## 2013-05-03 ENCOUNTER — Other Ambulatory Visit: Payer: Self-pay | Admitting: Cardiology

## 2013-05-03 ENCOUNTER — Ambulatory Visit (INDEPENDENT_AMBULATORY_CARE_PROVIDER_SITE_OTHER): Payer: Medicare Other | Admitting: *Deleted

## 2013-05-03 DIAGNOSIS — I482 Chronic atrial fibrillation, unspecified: Secondary | ICD-10-CM

## 2013-05-03 DIAGNOSIS — Z7901 Long term (current) use of anticoagulants: Secondary | ICD-10-CM

## 2013-05-03 DIAGNOSIS — Z9889 Other specified postprocedural states: Secondary | ICD-10-CM

## 2013-05-03 DIAGNOSIS — I4891 Unspecified atrial fibrillation: Secondary | ICD-10-CM

## 2013-05-24 ENCOUNTER — Ambulatory Visit (INDEPENDENT_AMBULATORY_CARE_PROVIDER_SITE_OTHER): Payer: Medicare Other | Admitting: *Deleted

## 2013-05-24 DIAGNOSIS — I4891 Unspecified atrial fibrillation: Secondary | ICD-10-CM

## 2013-05-24 DIAGNOSIS — Z9889 Other specified postprocedural states: Secondary | ICD-10-CM

## 2013-05-24 DIAGNOSIS — Z7901 Long term (current) use of anticoagulants: Secondary | ICD-10-CM

## 2013-05-24 DIAGNOSIS — I482 Chronic atrial fibrillation, unspecified: Secondary | ICD-10-CM

## 2013-05-24 LAB — POCT INR: INR: 2.9

## 2013-06-21 ENCOUNTER — Ambulatory Visit (INDEPENDENT_AMBULATORY_CARE_PROVIDER_SITE_OTHER): Payer: Medicare Other | Admitting: *Deleted

## 2013-06-21 DIAGNOSIS — Z9889 Other specified postprocedural states: Secondary | ICD-10-CM

## 2013-06-21 DIAGNOSIS — Z7901 Long term (current) use of anticoagulants: Secondary | ICD-10-CM

## 2013-06-21 DIAGNOSIS — I482 Chronic atrial fibrillation, unspecified: Secondary | ICD-10-CM

## 2013-06-21 DIAGNOSIS — I4891 Unspecified atrial fibrillation: Secondary | ICD-10-CM

## 2013-06-21 LAB — POCT INR: INR: 2.6

## 2013-08-02 ENCOUNTER — Ambulatory Visit (INDEPENDENT_AMBULATORY_CARE_PROVIDER_SITE_OTHER): Payer: Medicare Other | Admitting: *Deleted

## 2013-08-02 DIAGNOSIS — Z7901 Long term (current) use of anticoagulants: Secondary | ICD-10-CM

## 2013-08-02 DIAGNOSIS — Z9889 Other specified postprocedural states: Secondary | ICD-10-CM

## 2013-08-02 DIAGNOSIS — I4891 Unspecified atrial fibrillation: Secondary | ICD-10-CM

## 2013-08-02 DIAGNOSIS — I482 Chronic atrial fibrillation, unspecified: Secondary | ICD-10-CM

## 2013-08-02 LAB — POCT INR: INR: 2.7

## 2013-08-29 ENCOUNTER — Other Ambulatory Visit: Payer: Self-pay | Admitting: Cardiology

## 2013-09-13 ENCOUNTER — Ambulatory Visit (INDEPENDENT_AMBULATORY_CARE_PROVIDER_SITE_OTHER): Payer: Medicare Other | Admitting: *Deleted

## 2013-09-13 DIAGNOSIS — Z9889 Other specified postprocedural states: Secondary | ICD-10-CM

## 2013-09-13 DIAGNOSIS — I4891 Unspecified atrial fibrillation: Secondary | ICD-10-CM

## 2013-09-13 DIAGNOSIS — Z7901 Long term (current) use of anticoagulants: Secondary | ICD-10-CM

## 2013-09-13 DIAGNOSIS — I482 Chronic atrial fibrillation, unspecified: Secondary | ICD-10-CM

## 2013-10-25 ENCOUNTER — Ambulatory Visit (INDEPENDENT_AMBULATORY_CARE_PROVIDER_SITE_OTHER): Payer: Medicare Other | Admitting: *Deleted

## 2013-10-25 DIAGNOSIS — Z7901 Long term (current) use of anticoagulants: Secondary | ICD-10-CM

## 2013-10-25 DIAGNOSIS — I4891 Unspecified atrial fibrillation: Secondary | ICD-10-CM

## 2013-10-25 DIAGNOSIS — Z9889 Other specified postprocedural states: Secondary | ICD-10-CM

## 2013-10-25 DIAGNOSIS — I482 Chronic atrial fibrillation, unspecified: Secondary | ICD-10-CM

## 2013-10-25 LAB — POCT INR: INR: 3.1

## 2013-12-06 ENCOUNTER — Ambulatory Visit (INDEPENDENT_AMBULATORY_CARE_PROVIDER_SITE_OTHER): Payer: Medicare Other | Admitting: *Deleted

## 2013-12-06 DIAGNOSIS — Z9889 Other specified postprocedural states: Secondary | ICD-10-CM

## 2013-12-06 DIAGNOSIS — Z7901 Long term (current) use of anticoagulants: Secondary | ICD-10-CM

## 2013-12-06 DIAGNOSIS — I482 Chronic atrial fibrillation, unspecified: Secondary | ICD-10-CM

## 2013-12-06 DIAGNOSIS — I4891 Unspecified atrial fibrillation: Secondary | ICD-10-CM

## 2013-12-06 LAB — POCT INR: INR: 3.2

## 2014-01-14 ENCOUNTER — Ambulatory Visit (INDEPENDENT_AMBULATORY_CARE_PROVIDER_SITE_OTHER): Payer: Medicare Other | Admitting: *Deleted

## 2014-01-14 ENCOUNTER — Ambulatory Visit (INDEPENDENT_AMBULATORY_CARE_PROVIDER_SITE_OTHER): Payer: Medicare Other | Admitting: Cardiology

## 2014-01-14 VITALS — BP 124/54 | HR 78 | Ht 64.0 in | Wt 141.0 lb

## 2014-01-14 DIAGNOSIS — Z9889 Other specified postprocedural states: Secondary | ICD-10-CM

## 2014-01-14 DIAGNOSIS — Z7901 Long term (current) use of anticoagulants: Secondary | ICD-10-CM

## 2014-01-14 DIAGNOSIS — I4891 Unspecified atrial fibrillation: Secondary | ICD-10-CM

## 2014-01-14 DIAGNOSIS — I2510 Atherosclerotic heart disease of native coronary artery without angina pectoris: Secondary | ICD-10-CM

## 2014-01-14 DIAGNOSIS — E785 Hyperlipidemia, unspecified: Secondary | ICD-10-CM

## 2014-01-14 DIAGNOSIS — I5022 Chronic systolic (congestive) heart failure: Secondary | ICD-10-CM

## 2014-01-14 DIAGNOSIS — I482 Chronic atrial fibrillation, unspecified: Secondary | ICD-10-CM

## 2014-01-14 LAB — POCT INR: INR: 1.6

## 2014-01-14 MED ORDER — CARVEDILOL 6.25 MG PO TABS: 6.2500 mg | ORAL_TABLET | Freq: Two times a day (BID) | ORAL | Status: DC

## 2014-01-14 NOTE — Patient Instructions (Addendum)
Your physician recommends that you schedule a follow-up appointment in: 6 WEEKS  Your physician has recommended you make the following change in your medication:   1) STOP TAKING PRAVASTATIN 2) DECREASE COREG TO 6.25MG  TWICE DAILY

## 2014-01-14 NOTE — Progress Notes (Signed)
Clinical Summary [name] is a 80 y.o.female last seen by Dr [name], this is our first visit together. She is seen for the following medical problems.   1. CAD/ICM - prior PCI to LAD in 2008, CABG in 2013 - LVEF 35% by echo 03/2012, does not appear she has had repeat echo since CABG in our system, she reports a fairly recent study by her PCP Dr [name] - no chest pain. Notes some DOE, typically with doing house activities.  - decreased coreg to 12.5 mg once at night on her own due to symptoms of dizziness and fatigue, reports symptoms much improved since making change  - not on lisinopril because of history of angioedema   2. Mild Mitral regurgitation - s/p mitral valve repair - stable DOE, no LE edema.   3. A.fib - no palpitations - compliant with meds including coumadin, denies any bleeding problems.  - she reports only history afib postop perio  4. Hyperlipidemia - reports she stopped pravastatin because of muscle aches, reports she stopped approx 1 year ago - reports muscle aches on lipitor previously as well  Past Medical History  Diagnosis Date  . Type II or unspecified type diabetes mellitus without mention of complication, not stated as uncontrolled   . Unspecified hemorrhoids without mention of complication   . Depression   . CAD (coronary artery disease)     s/p PCI to LAD '08, Severe 3V CAD by cath 03/27/12 --> CABG  . Mitral regurgitation     moderate by cath, mild by echo 03/2012  . Persistent atrial fibrillation   . Osteoarthrosis, unspecified whether generalized or localized, unspecified site   . Personal history of other diseases of digestive system   . Unspecified glaucoma(365.9)   . Unspecified essential hypertension   . Thyroid disease     hypothyroidism  . CHF (congestive heart failure)      Allergies  Allergen Reactions  . Lisinopril Anaphylaxis  . Quinine Other (See Comments)    Pt states heart attack-like symptoms  . Sulfonamide Derivatives  Other (See Comments)    unknown     Current Outpatient Prescriptions  Medication Sig Dispense Refill  . aspirin 81 MG EC tablet Take 1 tablet (81 mg total) by mouth daily.  30 tablet    . carvedilol (COREG) 25 MG tablet Take 12.5 mg by mouth 2 (two) times daily with a meal.       . digoxin (LANOXIN) 0.0625 mg TABS Take 0.5 tablets (62.5 mcg total) by mouth daily.      [name]. FLUoxetine (PROZAC) 20 MG tablet Take 20 mg by mouth daily.      . furosemide (LASIX) 40 MG tablet Take 20 mg by mouth daily.       [name]. omeprazole (PRILOSEC) 20 MG capsule Take 20 mg by mouth daily.      . potassium chloride SA (K-DUR,KLOR-CON) 20 MEQ tablet Take 20 mEq by mouth daily.       . pravastatin (PRAVACHOL) 40 MG tablet Take 1 tablet (40 mg total) by mouth every evening.  90 tablet  3  . sitaGLIPtan-metformin (JANUMET) 50-500 MG per tablet Take 1 tablet by mouth 2 (two) times daily with a meal.      . warfarin (COUMADIN) 5 MG tablet TAKE 1 TABLET DAILY EXCEPT TAKE 1 & 1/2 TABLETS ON MON,WED,FRI  45 tablet  3   No current facility-administered medications for this visit.     Past Surgical History  Procedure  Laterality Date  . Coronary artery bypass graft  04/03/2012    Procedure: CORONARY ARTERY BYPASS GRAFTING (CABG);  Surgeon: Alison Perna, MD;  Location: [facility] OR;  Service: Open Heart Surgery;  Laterality: N/A;  CABG x four;  using left internal mammary artery and bilateral greater saphenous veins  . Mitral valve repair  04/03/2012    Procedure: MITRAL VALVE REPAIR (MVR);  Surgeon: Alison Perna, MD;  Location: [facility] OR;  Service: Open Heart Surgery;  Laterality: N/A;  . Ercp  2008  . Cholecystectomy  12/2006     Allergies  Allergen Reactions  . Lisinopril Anaphylaxis  . Quinine Other (See Comments)    Pt states heart attack-like symptoms  . Sulfonamide Derivatives Other (See Comments)    unknown      No family history on file.   Social History [name] reports that she has quit smoking. She has  never used smokeless tobacco. [name] reports that she does not drink alcohol.   Review of Systems CONSTITUTIONAL: No weight loss, fever, chills, weakness or fatigue.  HEENT: Eyes: No visual loss, blurred vision, double vision or yellow sclerae.No hearing loss, sneezing, congestion, runny nose or sore throat.  SKIN: No rash or itching.  CARDIOVASCULAR: per HPI RESPIRATORY: No  cough or sputum.  GASTROINTESTINAL: No anorexia, nausea, vomiting or diarrhea. No abdominal pain or blood.  GENITOURINARY: No burning on urination, no polyuria NEUROLOGICAL: No headache, dizziness, syncope, paralysis, ataxia, numbness or tingling in the extremities. No change in bowel or bladder control.  MUSCULOSKELETAL: No muscle, back pain, joint pain or stiffness.  LYMPHATICS: No enlarged nodes. No history of splenectomy.  PSYCHIATRIC: No history of depression or anxiety.  ENDOCRINOLOGIC: No reports of sweating, cold or heat intolerance. No polyuria or polydipsia.  [name]   Physical Examination p 78 bp 124/54 Wt 141 lbs BMI 24 Gen: resting comfortably, no acute distress HEENT: no scleral icterus, pupils equal round and reactive, no palptable cervical adenopathy,  CV: RRR, no m/r/g, no JVD, Resp: Clear to auscultation bilaterally GI: abdomen is soft, non-tender, non-distended, normal bowel sounds, no hepatosplenomegaly MSK: extremities are warm, no edema.  Skin: warm, no rash Neuro:  no focal deficits Psych: appropriate affect   Diagnostic Studies 03/2012 Echo LVEF 35%, mild LVH, akinesis inferior wall and apical septum. Mitral valve repair with no regurgitation, PASP 33  01/14/14 Clinic EKG Sinus rhythm, rate 85, normal axis, possible old anteroseptal infarct.   Assessment and Plan  1. CAD/ICM - NYHA I-II, last LVEF in our system 30-35% around the time of her CABG. She has never had an ICD placed by her prevoius cardiologists. Reports PCP has done an echo within the last year - request recent echo  results - change coreg to 6.25mg  bid as she was having symptoms on 12.5mg  bid, she is not on ACE-I because of angioedema, avoiding ARBs because of reported cross reactivity - pending recent echo results may consider spironolactone, potentially hydral/nitrates  2. Mitral regurgitation - prior repair in 2013, denies any new symptoms - continue to follow clinically  3. Afib - she reports only a history of postop CABG afib. - will review records to determine the duration of her afib, pending documentation can consider potentially coming off coumadin. Likely obtain holter before doing so, will review records.  4. Hyperlipidemia - intolerant to statins, has some labs coming up with her PCP in the next few weeks. F/u lipid panel.    Follow up 6 weeks Antoine Poche, M.D.,  F.A.C.C.

## 2014-01-16 ENCOUNTER — Encounter: Payer: Self-pay | Admitting: Cardiology

## 2014-01-18 ENCOUNTER — Encounter: Payer: Self-pay | Admitting: Cardiology

## 2014-02-04 ENCOUNTER — Ambulatory Visit (INDEPENDENT_AMBULATORY_CARE_PROVIDER_SITE_OTHER): Payer: Medicare Other | Admitting: *Deleted

## 2014-02-04 DIAGNOSIS — Z9889 Other specified postprocedural states: Secondary | ICD-10-CM

## 2014-02-04 DIAGNOSIS — Z5181 Encounter for therapeutic drug level monitoring: Secondary | ICD-10-CM

## 2014-02-04 DIAGNOSIS — Z7901 Long term (current) use of anticoagulants: Secondary | ICD-10-CM

## 2014-02-04 DIAGNOSIS — I4891 Unspecified atrial fibrillation: Secondary | ICD-10-CM

## 2014-02-04 DIAGNOSIS — I482 Chronic atrial fibrillation, unspecified: Secondary | ICD-10-CM

## 2014-02-04 LAB — POCT INR: INR: 3.1

## 2014-02-26 ENCOUNTER — Ambulatory Visit (INDEPENDENT_AMBULATORY_CARE_PROVIDER_SITE_OTHER): Payer: Medicare Other | Admitting: Cardiology

## 2014-02-26 VITALS — BP 134/70 | HR 80 | Ht 68.0 in | Wt 145.0 lb

## 2014-02-26 DIAGNOSIS — I4891 Unspecified atrial fibrillation: Secondary | ICD-10-CM

## 2014-02-26 DIAGNOSIS — I2510 Atherosclerotic heart disease of native coronary artery without angina pectoris: Secondary | ICD-10-CM

## 2014-02-26 DIAGNOSIS — I5022 Chronic systolic (congestive) heart failure: Secondary | ICD-10-CM

## 2014-02-26 DIAGNOSIS — Z9889 Other specified postprocedural states: Secondary | ICD-10-CM

## 2014-02-26 NOTE — Patient Instructions (Addendum)
Your physician wants you to follow-up in:  6 months You will receive a reminder letter in the mail two months in advance. If you don't receive a letter, please call our office to schedule the follow-up appointment.    Your physician recommends that you continue on your current medications as directed. Please refer to the Current Medication list given to you today.     Your physician has recommended that you wear a holter monitor. Holter monitors are medical devices that record the heart's electrical activity. Doctors most often use these monitors to diagnose arrhythmias. Arrhythmias are problems with the speed or rhythm of the heartbeat. The monitor is a small, portable device. You can wear one while you do your normal daily activities. This is usually used to diagnose what is causing palpitations/syncope (passing out).

## 2014-02-26 NOTE — Progress Notes (Signed)
Clinical Summary [name] is a 80 y.o.female seen today for follow up of the following medical problems.   1. CAD/ICM  - prior PCI to LAD in 2008, CABG in 2013  - LVEF 35% by echo 03/2012, does not appear she has had repeat echo since CABG in our system, she reports a fairly recent study by her PCP Dr Janna Archondiego which the obtained reports states normal LV function but does not describe the LVEF - no chest pain. Denies any DOE - decreased coreg to 12.5 mg once at night on her own due to symptoms of dizziness and fatigue, reports symptoms much improved since making change  - not on lisinopril because of history of angioedema   2. Mild Mitral regurgitation  - s/p mitral valve  With 26 mm physio II mitral ring - stable DOE, no LE edema.  - denies any LE edema, no orthopnea.   3. A.fib  - no palpitations  - compliant with meds including coumadin, denies any bleeding problems.  - she reports only history afib postop. From chart review this appears to be true. She has been on coumadin since that time  4. Hyperlipidemia  - reports she stopped pravastatin because of muscle aches, reports she stopped approx 1 year ago  - reports muscle aches on lipitor previously as well  09/2014: TC 163 HDL 29 LDL 96 TG 191  Past Medical History  Diagnosis Date  . Type II or unspecified type diabetes mellitus without mention of complication, not stated as uncontrolled   . Unspecified hemorrhoids without mention of complication   . Depression   . CAD (coronary artery disease)     s/p PCI to LAD '08, Severe 3V CAD by cath 03/27/12 --> CABG  . Mitral regurgitation     moderate by cath, mild by echo 03/2012  . Persistent atrial fibrillation   . Osteoarthrosis, unspecified whether generalized or localized, unspecified site   . Personal history of other diseases of digestive system   . Unspecified glaucoma(365.9)   . Unspecified essential hypertension   . Thyroid disease     hypothyroidism  . CHF  (congestive heart failure)      Allergies  Allergen Reactions  . Lisinopril Anaphylaxis  . Quinine Other (See Comments)    Pt states heart attack-like symptoms  . Sulfonamide Derivatives Other (See Comments)    unknown     Current Outpatient Prescriptions  Medication Sig Dispense Refill  . aspirin 81 MG EC tablet Take 1 tablet (81 mg total) by mouth daily.  30 tablet    . carvedilol (COREG) 6.25 MG tablet Take 1 tablet (6.25 mg total) by mouth 2 (two) times daily.  180 tablet  3  . digoxin (LANOXIN) 0.0625 mg TABS Take 0.5 tablets (62.5 mcg total) by mouth daily.      [name]. FLUoxetine (PROZAC) 20 MG tablet Take 20 mg by mouth daily.      . furosemide (LASIX) 40 MG tablet Take 20 mg by mouth daily.       [name]. omeprazole (PRILOSEC) 20 MG capsule Take 20 mg by mouth daily.      . sitaGLIPtan-metformin (JANUMET) 50-500 MG per tablet Take 1 tablet by mouth 2 (two) times daily with a meal.      . warfarin (COUMADIN) 5 MG tablet TAKE 1 TABLET DAILY EXCEPT TAKE 1 & 1/2 TABLETS ON MON,WED,FRI  45 tablet  3   No current facility-administered medications for this visit.     Past  Surgical History  Procedure Laterality Date  . Coronary artery bypass graft  04/03/2012    Procedure: CORONARY ARTERY BYPASS GRAFTING (CABG);  Surgeon: Kerin Perna, MD;  Location: [facility] OR;  Service: Open Heart Surgery;  Laterality: N/A;  CABG x four;  using left internal mammary artery and bilateral greater saphenous veins  . Mitral valve repair  04/03/2012    Procedure: MITRAL VALVE REPAIR (MVR);  Surgeon: Kerin Perna, MD;  Location: [facility] OR;  Service: Open Heart Surgery;  Laterality: N/A;  . Ercp  2008  . Cholecystectomy  12/2006     Allergies  Allergen Reactions  . Lisinopril Anaphylaxis  . Quinine Other (See Comments)    Pt states heart attack-like symptoms  . Sulfonamide Derivatives Other (See Comments)    unknown      No family history on file.   Social History Ms. Gebel reports that she has quit  smoking. She has never used smokeless tobacco. Ms. Stenberg reports that she does not drink alcohol.   Review of Systems CONSTITUTIONAL: No weight loss, fever, chills, weakness or fatigue.  HEENT: Eyes: No visual loss, blurred vision, double vision or yellow sclerae.No hearing loss, sneezing, congestion, runny nose or sore throat.  SKIN: No rash or itching.  CARDIOVASCULAR: per HPI RESPIRATORY: No shortness of breath, cough or sputum.  GASTROINTESTINAL: No anorexia, nausea, vomiting or diarrhea. No abdominal pain or blood.  GENITOURINARY: No burning on urination, no polyuria NEUROLOGICAL: No headache, dizziness, syncope, paralysis, ataxia, numbness or tingling in the extremities. No change in bowel or bladder control.  MUSCULOSKELETAL: No muscle, back pain, joint pain or stiffness.  LYMPHATICS: No enlarged nodes. No history of splenectomy.  PSYCHIATRIC: No history of depression or anxiety.  ENDOCRINOLOGIC: No reports of sweating, cold or heat intolerance. No polyuria or polydipsia.  [name]   Physical Examination p 80 bp 134/70 Wt 145 lbs BMI 22 Gen: resting comfortably, no acute distress HEENT: no scleral icterus, pupils equal round and reactive, no palptable cervical adenopathy,  CV: RRR, no m/r/g, no JVD, no carotid bruits Resp: Clear to auscultation bilaterally GI: abdomen is soft, non-tender, non-distended, normal bowel sounds, no hepatosplenomegaly MSK: extremities are warm, no edema.  Skin: warm, no rash Neuro:  no focal deficits Psych: appropriate affect    Assessment and Plan  1.CAD/ICM - last LVEF 35% 03/2012 in our system, a more recent echo report from her PCP indicated normal LV systolic function however LVEF is not reported. We contacted PCP to obtain disc however images are not available - pt is NYHA II, she is euvolemic today in clinic, she does not have an ICD as she was recently revascularized and had not had a repeat echo as well as considerations for her age - will  repeat echo to evaluate LV function, if persistently decreased she will need additional therapy with sprironolactone and potentially hydral/nitrates as she had angioedema on ACE-I  2. Afib - from notes this occurred only in the postop period, no prior history. She had been maintained on coumain since that time - will obtain 48 hr holter monitor, if no afib stop coumadin as isolated postop afib is not indication for long term anticoagulation - if normal LVEF and no afib likely stop digoxin as well  3. Mitral regurgitation - with prior repair, no current symptoms - continue to follow clinically  4. Hyperlipidemia - not able to tolerate statins      Antoine Poche, M.D., F.A.C.C.

## 2014-02-27 ENCOUNTER — Telehealth: Payer: Self-pay | Admitting: *Deleted

## 2014-02-27 DIAGNOSIS — I509 Heart failure, unspecified: Secondary | ICD-10-CM

## 2014-02-27 NOTE — Telephone Encounter (Signed)
Pt made aware of echo on Wed. March, 11th at 10:15 am.

## 2014-02-27 NOTE — Telephone Encounter (Signed)
Message copied by Thompson GrayerURHAM, Duel Conrad C on Wed Feb 27, 2014  3:59 PM ------      Message from: Antoine PocheBRANCH, JONATHAN F      Created: Tue Feb 26, 2014  7:42 PM       Please let patient know that Dr Carmina Millerondiegos office did not have the echo images available for me to review. I am hesitant to make significant medication changes without confirming her heart function has improved. Please order an echo without contrast for CHF for her.            Dina RichJonathan Branch MD ------

## 2014-03-04 ENCOUNTER — Ambulatory Visit (INDEPENDENT_AMBULATORY_CARE_PROVIDER_SITE_OTHER): Payer: Medicare Other | Admitting: *Deleted

## 2014-03-04 ENCOUNTER — Ambulatory Visit (HOSPITAL_COMMUNITY)
Admission: RE | Admit: 2014-03-04 | Discharge: 2014-03-04 | Disposition: A | Discharge status: Home / Self Care | Payer: Medicare Other | Source: Ambulatory Visit | Attending: Cardiology | Admitting: Cardiology

## 2014-03-04 DIAGNOSIS — I4891 Unspecified atrial fibrillation: Secondary | ICD-10-CM

## 2014-03-04 DIAGNOSIS — I482 Chronic atrial fibrillation, unspecified: Secondary | ICD-10-CM

## 2014-03-04 DIAGNOSIS — Z7901 Long term (current) use of anticoagulants: Secondary | ICD-10-CM

## 2014-03-04 DIAGNOSIS — Z5181 Encounter for therapeutic drug level monitoring: Secondary | ICD-10-CM

## 2014-03-04 DIAGNOSIS — Z9889 Other specified postprocedural states: Secondary | ICD-10-CM

## 2014-03-04 LAB — POCT INR: INR: 2.3

## 2014-03-04 NOTE — Progress Notes (Signed)
48 hour Holter Monitor in progress. 

## 2014-03-06 ENCOUNTER — Other Ambulatory Visit (HOSPITAL_COMMUNITY): Payer: Medicare Other

## 2014-03-13 ENCOUNTER — Ambulatory Visit (HOSPITAL_COMMUNITY)
Admission: RE | Admit: 2014-03-13 | Discharge: 2014-03-13 | Disposition: A | Discharge status: Home / Self Care | Payer: Medicare Other | Source: Ambulatory Visit | Attending: Cardiology | Admitting: Cardiology

## 2014-03-13 ENCOUNTER — Other Ambulatory Visit: Payer: Self-pay | Admitting: Cardiology

## 2014-03-13 DIAGNOSIS — E785 Hyperlipidemia, unspecified: Secondary | ICD-10-CM | POA: Insufficient documentation

## 2014-03-13 DIAGNOSIS — I2589 Other forms of chronic ischemic heart disease: Secondary | ICD-10-CM | POA: Insufficient documentation

## 2014-03-13 DIAGNOSIS — I4891 Unspecified atrial fibrillation: Secondary | ICD-10-CM | POA: Insufficient documentation

## 2014-03-13 DIAGNOSIS — E119 Type 2 diabetes mellitus without complications: Secondary | ICD-10-CM | POA: Insufficient documentation

## 2014-03-13 DIAGNOSIS — Z951 Presence of aortocoronary bypass graft: Secondary | ICD-10-CM | POA: Insufficient documentation

## 2014-03-13 DIAGNOSIS — I10 Essential (primary) hypertension: Secondary | ICD-10-CM | POA: Insufficient documentation

## 2014-03-13 DIAGNOSIS — I059 Rheumatic mitral valve disease, unspecified: Secondary | ICD-10-CM

## 2014-03-13 DIAGNOSIS — I2510 Atherosclerotic heart disease of native coronary artery without angina pectoris: Secondary | ICD-10-CM | POA: Insufficient documentation

## 2014-03-13 DIAGNOSIS — I509 Heart failure, unspecified: Secondary | ICD-10-CM | POA: Insufficient documentation

## 2014-03-13 DIAGNOSIS — Z87891 Personal history of nicotine dependence: Secondary | ICD-10-CM | POA: Insufficient documentation

## 2014-03-13 NOTE — Progress Notes (Signed)
*  PRELIMINARY RESULTS* Echocardiogram 2D Echocardiogram has been performed.  [name] 03/13/2014, 2:37 PM

## 2014-04-01 ENCOUNTER — Ambulatory Visit (INDEPENDENT_AMBULATORY_CARE_PROVIDER_SITE_OTHER): Payer: Medicare Other | Admitting: *Deleted

## 2014-04-01 DIAGNOSIS — I4891 Unspecified atrial fibrillation: Secondary | ICD-10-CM

## 2014-04-01 DIAGNOSIS — I482 Chronic atrial fibrillation, unspecified: Secondary | ICD-10-CM

## 2014-04-01 DIAGNOSIS — Z9889 Other specified postprocedural states: Secondary | ICD-10-CM

## 2014-04-01 DIAGNOSIS — Z5181 Encounter for therapeutic drug level monitoring: Secondary | ICD-10-CM

## 2014-04-01 DIAGNOSIS — Z7901 Long term (current) use of anticoagulants: Secondary | ICD-10-CM

## 2014-04-01 LAB — POCT INR: INR: 2

## 2014-04-19 ENCOUNTER — Encounter (HOSPITAL_COMMUNITY)
Admission: RE | Admit: 2014-04-19 | Discharge: 2014-04-19 | Disposition: A | Discharge status: Home / Self Care | Payer: Medicare Other | Source: Ambulatory Visit | Attending: Family Medicine | Admitting: Family Medicine

## 2014-04-19 DIAGNOSIS — K922 Gastrointestinal hemorrhage, unspecified: Secondary | ICD-10-CM | POA: Diagnosis present

## 2014-04-19 LAB — PREPARE RBC (CROSSMATCH)

## 2014-04-19 LAB — HEMOGLOBIN AND HEMATOCRIT, BLOOD
HCT: 21.2 % — ABNORMAL LOW (ref 36.0–46.0)
Hemoglobin: 5.3 g/dL — CL (ref 12.0–15.0)

## 2014-04-19 NOTE — Progress Notes (Signed)
Blood drawn for type cross, type screen, hh. Due to antibodies in blood, pt told to go home and return to dept 300 tomorrow for transfusions per Dr Janna ArchonDiego.

## 2014-04-20 ENCOUNTER — Ambulatory Visit (HOSPITAL_COMMUNITY)
Admission: EM | Admit: 2014-04-20 | Discharge: 2014-04-20 | Disposition: A | Discharge status: Home / Self Care | Payer: Medicare Other | Attending: Family Medicine | Admitting: Family Medicine

## 2014-04-20 ENCOUNTER — Ambulatory Visit (HOSPITAL_COMMUNITY)
Admission: RE | Admit: 2014-04-20 | Discharge: 2014-04-20 | Disposition: A | Discharge status: Home / Self Care | Payer: Medicare Other | Source: Ambulatory Visit

## 2014-04-20 ENCOUNTER — Ambulatory Visit (HOSPITAL_COMMUNITY): Admission: EM | Admit: 2014-04-20 | Payer: Medicare Other | Source: Ambulatory Visit | Admitting: Internal Medicine

## 2014-04-20 DIAGNOSIS — K922 Gastrointestinal hemorrhage, unspecified: Secondary | ICD-10-CM | POA: Insufficient documentation

## 2014-04-20 MED ORDER — SODIUM CHLORIDE 0.9 % IV SOLN: INTRAVENOUS | Status: DC

## 2014-04-20 MED ORDER — SODIUM CHLORIDE 0.9 % IV SOLN
INTRAVENOUS | Status: DC
  Administered 2014-04-20: 09:00:00 via INTRAVENOUS

## 2014-04-21 LAB — TYPE AND SCREEN
ABO/RH(D): O NEG
ANTIBODY SCREEN: POSITIVE
Donor AG Type: NEGATIVE
Donor AG Type: NEGATIVE
UNIT DIVISION: 0
UNIT DIVISION: 0

## 2014-04-30 ENCOUNTER — Ambulatory Visit (INDEPENDENT_AMBULATORY_CARE_PROVIDER_SITE_OTHER): Payer: Medicare Other | Admitting: *Deleted

## 2014-04-30 DIAGNOSIS — Z5181 Encounter for therapeutic drug level monitoring: Secondary | ICD-10-CM

## 2014-04-30 DIAGNOSIS — I4891 Unspecified atrial fibrillation: Secondary | ICD-10-CM

## 2014-04-30 DIAGNOSIS — I482 Chronic atrial fibrillation, unspecified: Secondary | ICD-10-CM

## 2014-04-30 DIAGNOSIS — Z9889 Other specified postprocedural states: Secondary | ICD-10-CM

## 2014-09-03 ENCOUNTER — Ambulatory Visit (INDEPENDENT_AMBULATORY_CARE_PROVIDER_SITE_OTHER): Payer: Medicare Other | Admitting: Cardiology

## 2014-09-03 ENCOUNTER — Encounter: Payer: Self-pay | Admitting: Cardiology

## 2014-09-03 VITALS — BP 138/82 | HR 76 | Ht 68.0 in | Wt 136.0 lb

## 2014-09-03 DIAGNOSIS — I2510 Atherosclerotic heart disease of native coronary artery without angina pectoris: Secondary | ICD-10-CM

## 2014-09-03 DIAGNOSIS — Z9889 Other specified postprocedural states: Secondary | ICD-10-CM

## 2014-09-03 DIAGNOSIS — I4891 Unspecified atrial fibrillation: Secondary | ICD-10-CM

## 2014-09-03 DIAGNOSIS — I5022 Chronic systolic (congestive) heart failure: Secondary | ICD-10-CM

## 2014-09-03 DIAGNOSIS — E785 Hyperlipidemia, unspecified: Secondary | ICD-10-CM

## 2014-09-03 NOTE — Progress Notes (Signed)
Clinical Summary Ms. Digangi is a 80 y.o.female seen today for follow up of the following medical problems.   1. CAD/ICM/Chronic combined systolic and diastolic heart failure - prior PCI to LAD in 2008, CABG in 2013  - LVEF 35% by echo 03/2012, repeat echo 02/2014 LVEF 45-50% with grade II diastolic dysfunction - no chest pain. Denies any DOE  - not on lisinopril because of history of angioedema  - significant dizziness and fatigue on high coreg doses, tolerating 6.25mg  bid well  2. Mild Mitral regurgitation  - s/p mitral valve With 26 mm physio II mitral ring. No significant regurg on most recent echo - stable DOE, no LE edema.  - denies any LE edema, no orthopnea.    3. Post operative A.fib  - from chart review her afib appears to have been postcardiac surgery - monitor 02/2014 without any episodes - had been on coumadin, recently noted to have severe anemia with Hgb around 5, coumadin was stopped and started on ASA  4. Hyperlipidemia  - reports she stopped pravastatin because of muscle aches, reports she stopped approx 1 year ago  - reports muscle aches on lipitor previously as well  09/2014: TC 163 HDL 29 LDL 96 TG 191  5. Anemia - pcp noted Hgb at 5.3 in 03/2014, presented for transfuion. She denies any known bleeding.  - she is off coumadin, now only ASA.    Past Medical History  Diagnosis Date  . Type II or unspecified type diabetes mellitus without mention of complication, not stated as uncontrolled   . Unspecified hemorrhoids without mention of complication   . Depression   . CAD (coronary artery disease)     s/p PCI to LAD '08, Severe 3V CAD by cath 03/27/12 --> CABG  . Mitral regurgitation     moderate by cath, mild by echo 03/2012  . Persistent atrial fibrillation   . Osteoarthrosis, unspecified whether generalized or localized, unspecified site   . Personal history of other diseases of digestive system   . Unspecified glaucoma(365.9)   . Unspecified essential  hypertension   . Thyroid disease     hypothyroidism  . CHF (congestive heart failure)      Allergies  Allergen Reactions  . Lisinopril Anaphylaxis  . Quinine Other (See Comments)    Pt states heart attack-like symptoms  . Sulfonamide Derivatives Other (See Comments)    unknown     Current Outpatient Prescriptions  Medication Sig Dispense Refill  . aspirin 81 MG EC tablet Take 1 tablet (81 mg total) by mouth daily.  30 tablet    . carvedilol (COREG) 6.25 MG tablet Take 1 tablet (6.25 mg total) by mouth 2 (two) times daily.  180 tablet  3  . digoxin (LANOXIN) 0.0625 mg TABS Take 0.5 tablets (62.5 mcg total) by mouth daily.      [name] FLUoxetine (PROZAC) 20 MG tablet Take 20 mg by mouth daily.      . furosemide (LASIX) 20 MG tablet Take 20 mg by mouth as needed.      [name] omeprazole (PRILOSEC) 20 MG capsule Take 20 mg by mouth daily.      . potassium chloride SA (K-DUR,KLOR-CON) 20 MEQ tablet Take 20 mEq by mouth 2 (two) times daily.      . sitaGLIPtan-metformin (JANUMET) 50-500 MG per tablet Take 1 tablet by mouth 2 (two) times daily with a meal.      . warfarin (COUMADIN) 5 MG tablet Take 1 tablet daily  except 1 1/2 tablets on Fridays  45 tablet  3   No current facility-administered medications for this visit.   Facility-Administered Medications Ordered in Other Visits  Medication Dose Route Frequency Provider Last Rate Last Dose  . 0.9 %  sodium chloride infusion   Intravenous Continuous Beryle Beams, MD         Past Surgical History  Procedure Laterality Date  . Coronary artery bypass graft  04/03/2012    Procedure: CORONARY ARTERY BYPASS GRAFTING (CABG);  Surgeon: Kerin Perna, MD;  Location: [facility] OR;  Service: Open Heart Surgery;  Laterality: N/A;  CABG x four;  using left internal mammary artery and bilateral greater saphenous veins  . Mitral valve repair  04/03/2012    Procedure: MITRAL VALVE REPAIR (MVR);  Surgeon: Kerin Perna, MD;  Location: [facility] OR;  Service: Open Heart  Surgery;  Laterality: N/A;  . Ercp  2008  . Cholecystectomy  12/2006     Allergies  Allergen Reactions  . Lisinopril Anaphylaxis  . Quinine Other (See Comments)    Pt states heart attack-like symptoms  . Sulfonamide Derivatives Other (See Comments)    unknown      No family history on file.   Social History Ms. Hieronymus reports that she has quit smoking. She has never used smokeless tobacco. Ms. Tatro reports that she does not drink alcohol.   Review of Systems CONSTITUTIONAL: No weight loss, fever, chills, weakness or fatigue.  HEENT: Eyes: No visual loss, blurred vision, double vision or yellow sclerae.No hearing loss, sneezing, congestion, runny nose or sore throat.  SKIN: No rash or itching.  CARDIOVASCULAR: per HPI RESPIRATORY: No shortness of breath, cough or sputum.  GASTROINTESTINAL: No anorexia, nausea, vomiting or diarrhea. No abdominal pain or blood.  GENITOURINARY: No burning on urination, no polyuria NEUROLOGICAL: No headache, dizziness, syncope, paralysis, ataxia, numbness or tingling in the extremities. No change in bowel or bladder control.  MUSCULOSKELETAL: No muscle, back pain, joint pain or stiffness.  LYMPHATICS: No enlarged nodes. No history of splenectomy.  PSYCHIATRIC: No history of depression or anxiety.  ENDOCRINOLOGIC: No reports of sweating, cold or heat intolerance. No polyuria or polydipsia.  [name]   Physical Examination p 76 bp 138/82 Wt 136 lbs BMI 21 Gen: resting comfortably, no acute distress HEENT: no scleral icterus, pupils equal round and reactive, no palptable cervical adenopathy,  CV: RRR, no m/r/g, no JVD, no carotid bruits Resp: Clear to auscultation bilaterally GI: abdomen is soft, non-tender, non-distended, normal bowel sounds, no hepatosplenomegaly MSK: extremities are warm, no edema.  Skin: warm, no rash Neuro:  no focal deficits Psych: appropriate affect   Diagnostic Studies  02/2014 Echo Study Conclusions  - Left  ventricle: The cavity size was normal. Wall thickness was increased in a pattern of mild LVH. Systolic function was mildly reduced. The estimated ejection fraction was in the range of 45% to 50%. There is hypokinesis of the distalanteroseptal myocardium. There is akinesis of the basalinferior myocardium. Features are consistent with a pseudonormal left ventricular filling pattern, with concomitant abnormal relaxation and increased filling pressure (grade 2 diastolic dysfunction). - Aortic valve: Mildly calcified annulus. Trileaflet. Trivial regurgitation. - Mitral valve: Mildly thickened leaflets . A ring prosthesis was present. No significant regurgitation. Mean gradient: 11mm Hg (D). Valve area by pressure half-time: 4.68cm^2. Valve area by continuity equation (using LVOT flow): 1.31cm^2. - Left atrium: The atrium was mildly dilated. - Right ventricle: The cavity size was mildly dilated. - Right atrium: The atrium  was mildly dilated. Central venous pressure: 3mm Hg (est). - Tricuspid valve: Mild regurgitation. - Pulmonary arteries: PA peak pressure: 42mm Hg (S). - Pericardium, extracardiac: There was no pericardial effusion. Impressions:  - Mild LVH with LVEF 45-50%, wall motion abnormalities as noted above, grade 2 diastolic dysfunction. Compared to study 2013, there has been improvement in LVEF and rhythm has gone from atrial fibrillation to sinus. Mild biatrial enlargement. Mitral annular ring with no significant mtiral regurgitation. Somewhat discordant information regarding possible stenotic flow as noted above. Mild RV enlargement with mild tricuspid regurgitation and PASP 42 mmHg.     Assessment and Plan   1.CAD/ICM/Chronic systolic heart failure - LVEF 78% 03/2012, repeat echo 02/2014 improved to 45-50% - she appears euvolemic today, no significant symptoms - not on ACE due to angioedema, no ARB due to reported crossreactivity with angioedema. Higher  doses of coreg caused side effects - continue current meds  2. Postoperative Afib  - from notes this occurred only in the postop period, no prior history. She had been maintained on coumain since that time  - 48 hr holter without evidence of afib - now off coumadin due to severe anemia, from afib standpoint no indication to consider restarting - stop digoxin, no indication with near normal LVEF and no afib  3. Mitral regurgitation  - with prior repair, no current symptoms  - continue to follow clinically   4. Hyperlipidemia  - not able to tolerate statins   F/u 6 months    Antoine Poche, M.D., F.A.C.C.

## 2014-09-03 NOTE — Patient Instructions (Signed)
Your physician wants you to follow-up in: 6 months You will receive a reminder letter in the mail two months in advance. If you don't receive a letter, please call our office to schedule the follow-up appointment.      Your physician has recommended you make the following change in your medication:      STOP Digoxin       Thank you for choosing [facility] !

## 2014-12-05 ENCOUNTER — Encounter (HOSPITAL_COMMUNITY): Payer: Self-pay | Admitting: Cardiology

## 2015-01-15 ENCOUNTER — Other Ambulatory Visit: Payer: Self-pay | Admitting: Cardiology

## 2015-01-15 MED ORDER — CARVEDILOL 6.25 MG PO TABS: 6.2500 mg | ORAL_TABLET | Freq: Two times a day (BID) | ORAL | Status: DC

## 2015-01-15 NOTE — Telephone Encounter (Signed)
Received fax refill request  Rx # [id number] Medication:  Carvedilol 6.25 mg tablet Qty 180 Sig:  Take one tablet by mouth two times daily Physician:  Wyline MoodBranch

## 2015-02-24 ENCOUNTER — Encounter (HOSPITAL_COMMUNITY): Payer: Self-pay | Admitting: Cardiology

## 2015-02-24 ENCOUNTER — Inpatient Hospital Stay (HOSPITAL_COMMUNITY)
Admission: EM | Admit: 2015-02-24 | Discharge: 2015-02-27 | DRG: 243 | Disposition: A | Discharge status: Home / Self Care | Payer: Medicare HMO | Attending: Internal Medicine | Admitting: Internal Medicine

## 2015-02-24 ENCOUNTER — Emergency Department (HOSPITAL_COMMUNITY): Payer: Medicare HMO

## 2015-02-24 DIAGNOSIS — I480 Paroxysmal atrial fibrillation: Secondary | ICD-10-CM

## 2015-02-24 DIAGNOSIS — I25810 Atherosclerosis of coronary artery bypass graft(s) without angina pectoris: Secondary | ICD-10-CM

## 2015-02-24 DIAGNOSIS — I10 Essential (primary) hypertension: Secondary | ICD-10-CM | POA: Diagnosis present

## 2015-02-24 DIAGNOSIS — I442 Atrioventricular block, complete: Principal | ICD-10-CM | POA: Diagnosis present

## 2015-02-24 DIAGNOSIS — I441 Atrioventricular block, second degree: Secondary | ICD-10-CM | POA: Insufficient documentation

## 2015-02-24 DIAGNOSIS — D649 Anemia, unspecified: Secondary | ICD-10-CM | POA: Diagnosis present

## 2015-02-24 DIAGNOSIS — Z951 Presence of aortocoronary bypass graft: Secondary | ICD-10-CM

## 2015-02-24 DIAGNOSIS — H409 Unspecified glaucoma: Secondary | ICD-10-CM | POA: Diagnosis present

## 2015-02-24 DIAGNOSIS — R001 Bradycardia, unspecified: Secondary | ICD-10-CM | POA: Diagnosis present

## 2015-02-24 DIAGNOSIS — R0602 Shortness of breath: Secondary | ICD-10-CM

## 2015-02-24 DIAGNOSIS — E119 Type 2 diabetes mellitus without complications: Secondary | ICD-10-CM | POA: Diagnosis present

## 2015-02-24 DIAGNOSIS — M1990 Unspecified osteoarthritis, unspecified site: Secondary | ICD-10-CM | POA: Diagnosis present

## 2015-02-24 DIAGNOSIS — Z87891 Personal history of nicotine dependence: Secondary | ICD-10-CM | POA: Diagnosis not present

## 2015-02-24 DIAGNOSIS — Z7982 Long term (current) use of aspirin: Secondary | ICD-10-CM | POA: Diagnosis not present

## 2015-02-24 DIAGNOSIS — I5022 Chronic systolic (congestive) heart failure: Secondary | ICD-10-CM | POA: Diagnosis present

## 2015-02-24 DIAGNOSIS — I2510 Atherosclerotic heart disease of native coronary artery without angina pectoris: Secondary | ICD-10-CM | POA: Diagnosis present

## 2015-02-24 DIAGNOSIS — Z959 Presence of cardiac and vascular implant and graft, unspecified: Secondary | ICD-10-CM

## 2015-02-24 DIAGNOSIS — Z9889 Other specified postprocedural states: Secondary | ICD-10-CM

## 2015-02-24 DIAGNOSIS — I481 Persistent atrial fibrillation: Secondary | ICD-10-CM | POA: Diagnosis present

## 2015-02-24 DIAGNOSIS — E039 Hypothyroidism, unspecified: Secondary | ICD-10-CM | POA: Diagnosis present

## 2015-02-24 DIAGNOSIS — R112 Nausea with vomiting, unspecified: Secondary | ICD-10-CM

## 2015-02-24 DIAGNOSIS — I4891 Unspecified atrial fibrillation: Secondary | ICD-10-CM

## 2015-02-24 DIAGNOSIS — Z955 Presence of coronary angioplasty implant and graft: Secondary | ICD-10-CM | POA: Diagnosis not present

## 2015-02-24 DIAGNOSIS — I482 Chronic atrial fibrillation: Secondary | ICD-10-CM | POA: Diagnosis present

## 2015-02-24 DIAGNOSIS — E118 Type 2 diabetes mellitus with unspecified complications: Secondary | ICD-10-CM

## 2015-02-24 LAB — COMPREHENSIVE METABOLIC PANEL
ALT: 19 U/L (ref 0–35)
ANION GAP: 3 — AB (ref 5–15)
AST: 22 U/L (ref 0–37)
Albumin: 3.7 g/dL (ref 3.5–5.2)
Alkaline Phosphatase: 78 U/L (ref 39–117)
BUN: 20 mg/dL (ref 6–23)
CALCIUM: 9 mg/dL (ref 8.4–10.5)
CO2: 26 mmol/L (ref 19–32)
CREATININE: 0.98 mg/dL (ref 0.50–1.10)
Chloride: 106 mmol/L (ref 96–112)
GFR calc Af Amer: 61 mL/min — ABNORMAL LOW (ref >90)
GFR, EST NON AFRICAN AMERICAN: 53 mL/min — AB (ref >90)
Glucose, Bld: 247 mg/dL — ABNORMAL HIGH (ref 70–99)
Potassium: 4.1 mmol/L (ref 3.5–5.1)
Sodium: 135 mmol/L (ref 135–145)
Total Bilirubin: 0.3 mg/dL (ref 0.3–1.2)
Total Protein: 7 g/dL (ref 6.0–8.3)

## 2015-02-24 LAB — PROTIME-INR
INR: 0.98 (ref 0.00–1.49)
Prothrombin Time: 13.1 seconds (ref 11.6–15.2)

## 2015-02-24 LAB — CBC WITH DIFFERENTIAL/PLATELET
BASOS ABS: 0 10*3/uL (ref 0.0–0.1)
BASOS PCT: 0 % (ref 0–1)
EOS ABS: 0.1 10*3/uL (ref 0.0–0.7)
EOS PCT: 1 % (ref 0–5)
HCT: 40.4 % (ref 36.0–46.0)
Hemoglobin: 12.8 g/dL (ref 12.0–15.0)
Lymphocytes Relative: 33 % (ref 12–46)
Lymphs Abs: 2.3 10*3/uL (ref 0.7–4.0)
MCH: 27.9 pg (ref 26.0–34.0)
MCHC: 31.7 g/dL (ref 30.0–36.0)
MCV: 88.2 fL (ref 78.0–100.0)
MONO ABS: 0.6 10*3/uL (ref 0.1–1.0)
Monocytes Relative: 9 % (ref 3–12)
Neutro Abs: 3.9 10*3/uL (ref 1.7–7.7)
Neutrophils Relative %: 57 % (ref 43–77)
Platelets: 198 10*3/uL (ref 150–400)
RBC: 4.58 MIL/uL (ref 3.87–5.11)
RDW: 14.3 % (ref 11.5–15.5)
WBC: 6.9 10*3/uL (ref 4.0–10.5)

## 2015-02-24 LAB — TSH: TSH: 4.55 u[IU]/mL — ABNORMAL HIGH (ref 0.350–4.500)

## 2015-02-24 LAB — TROPONIN I: Troponin I: <0.03 ng/mL (ref <0.031)

## 2015-02-24 LAB — DIGOXIN LEVEL: Digoxin Level: <0.2 ng/mL — ABNORMAL LOW (ref 0.8–2.0)

## 2015-02-24 LAB — BRAIN NATRIURETIC PEPTIDE: B Natriuretic Peptide: 423 pg/mL — ABNORMAL HIGH (ref 0.0–100.0)

## 2015-02-24 LAB — MRSA PCR SCREENING: MRSA BY PCR: NEGATIVE

## 2015-02-24 LAB — GLUCOSE, CAPILLARY: GLUCOSE-CAPILLARY: 101 mg/dL — AB (ref 70–99)

## 2015-02-24 MED ORDER — SODIUM CHLORIDE 0.9 % IJ SOLN: 3.0000 mL | INTRAMUSCULAR | Status: DC | PRN

## 2015-02-24 MED ORDER — ATROPINE SULFATE 0.1 MG/ML IJ SOLN
0.5000 mg | Freq: Once | INTRAMUSCULAR | Status: AC
  Administered 2015-02-24: 0.5 mg via INTRAVENOUS
  Filled 2015-02-24: qty 10

## 2015-02-24 MED ORDER — INSULIN ASPART 100 UNIT/ML ~~LOC~~ SOLN
0.0000 [IU] | Freq: Three times a day (TID) | SUBCUTANEOUS | Status: DC
  Administered 2015-02-25: 2 [IU] via SUBCUTANEOUS
  Administered 2015-02-25: 3 [IU] via SUBCUTANEOUS
  Administered 2015-02-26: 2 [IU] via SUBCUTANEOUS
  Administered 2015-02-27 (×3): 3 [IU] via SUBCUTANEOUS

## 2015-02-24 MED ORDER — ASPIRIN 325 MG PO TABS
325.0000 mg | ORAL_TABLET | Freq: Every day | ORAL | Status: DC
  Administered 2015-02-25 – 2015-02-26 (×2): 325 mg via ORAL
  Filled 2015-02-24 (×2): qty 1

## 2015-02-24 MED ORDER — ACETAMINOPHEN 325 MG PO TABS: 650.0000 mg | ORAL_TABLET | Freq: Four times a day (QID) | ORAL | Status: DC | PRN

## 2015-02-24 MED ORDER — SODIUM CHLORIDE 0.9 % IV SOLN
INTRAVENOUS | Status: DC
  Administered 2015-02-24 – 2015-02-26 (×3): via INTRAVENOUS

## 2015-02-24 MED ORDER — SODIUM CHLORIDE 0.9 % IJ SOLN
3.0000 mL | Freq: Two times a day (BID) | INTRAMUSCULAR | Status: DC
  Administered 2015-02-24 – 2015-02-26 (×3): 3 mL via INTRAVENOUS

## 2015-02-24 MED ORDER — INSULIN ASPART 100 UNIT/ML ~~LOC~~ SOLN: 0.0000 [IU] | Freq: Every day | SUBCUTANEOUS | Status: DC

## 2015-02-24 MED ORDER — FUROSEMIDE 10 MG/ML IJ SOLN: 20.0000 mg | Freq: Once | INTRAMUSCULAR | Status: DC

## 2015-02-24 MED ORDER — DOPAMINE-DEXTROSE 3.2-5 MG/ML-% IV SOLN
2.5000 ug/kg/min | INTRAVENOUS | Status: DC
  Administered 2015-02-24: 2.5 ug/kg/min via INTRAVENOUS
  Filled 2015-02-24: qty 250

## 2015-02-24 MED ORDER — ONDANSETRON HCL 4 MG PO TABS: 4.0000 mg | ORAL_TABLET | Freq: Four times a day (QID) | ORAL | Status: DC | PRN

## 2015-02-24 MED ORDER — FERROUS SULFATE 325 (65 FE) MG PO TABS
325.0000 mg | ORAL_TABLET | Freq: Every day | ORAL | Status: DC
  Administered 2015-02-25 – 2015-02-27 (×3): 325 mg via ORAL
  Filled 2015-02-24 (×4): qty 1

## 2015-02-24 MED ORDER — TRAZODONE 25 MG HALF TABLET
25.0000 mg | ORAL_TABLET | Freq: Every evening | ORAL | Status: DC | PRN
  Filled 2015-02-24: qty 1

## 2015-02-24 MED ORDER — ATROPINE SULFATE 0.1 MG/ML IJ SOLN
0.5000 mg | Freq: Once | INTRAMUSCULAR | Status: AC
  Administered 2015-02-24: 0.5 mg via INTRAVENOUS

## 2015-02-24 MED ORDER — SENNOSIDES-DOCUSATE SODIUM 8.6-50 MG PO TABS
1.0000 | ORAL_TABLET | Freq: Every evening | ORAL | Status: DC | PRN
  Filled 2015-02-24: qty 1

## 2015-02-24 MED ORDER — SODIUM CHLORIDE 0.9 % IV SOLN: 250.0000 mL | INTRAVENOUS | Status: DC | PRN

## 2015-02-24 MED ORDER — ONDANSETRON HCL 4 MG/2ML IJ SOLN: 4.0000 mg | Freq: Four times a day (QID) | INTRAMUSCULAR | Status: DC | PRN

## 2015-02-24 MED ORDER — FUROSEMIDE 10 MG/ML IJ SOLN
20.0000 mg | Freq: Once | INTRAMUSCULAR | Status: AC
  Administered 2015-02-24: 20 mg via INTRAVENOUS
  Filled 2015-02-24: qty 2

## 2015-02-24 MED ORDER — ASPIRIN 325 MG PO TABS: 325.0000 mg | ORAL_TABLET | Freq: Every day | ORAL | Status: DC

## 2015-02-24 MED ORDER — ALUM & MAG HYDROXIDE-SIMETH 200-200-20 MG/5ML PO SUSP: 30.0000 mL | Freq: Four times a day (QID) | ORAL | Status: DC | PRN

## 2015-02-24 MED ORDER — ENOXAPARIN SODIUM 40 MG/0.4ML ~~LOC~~ SOLN
40.0000 mg | SUBCUTANEOUS | Status: DC
  Administered 2015-02-24 – 2015-02-25 (×2): 40 mg via SUBCUTANEOUS
  Filled 2015-02-24 (×3): qty 0.4

## 2015-02-24 MED ORDER — ACETAMINOPHEN 650 MG RE SUPP: 650.0000 mg | Freq: Four times a day (QID) | RECTAL | Status: DC | PRN

## 2015-02-24 MED ORDER — HYDROCODONE-ACETAMINOPHEN 5-325 MG PO TABS: 1.0000 | ORAL_TABLET | ORAL | Status: DC | PRN

## 2015-02-24 MED ORDER — PANTOPRAZOLE SODIUM 40 MG PO TBEC
40.0000 mg | DELAYED_RELEASE_TABLET | Freq: Every day | ORAL | Status: DC
  Administered 2015-02-25 – 2015-02-27 (×3): 40 mg via ORAL
  Filled 2015-02-24 (×3): qty 1

## 2015-02-24 NOTE — ED Notes (Signed)
Sob since 3am.  Went to family doctor and had ekg.  Heart rate in the 30's.  Sent here to the ER.

## 2015-02-24 NOTE — ED Provider Notes (Signed)
CSN: [id number]     Arrival date & time 02/24/15  1147 History  This chart was scribed for Alison LennertJoseph L Smokey Melott, MD by Ronney LionSuzanne Le, ED Scribe. This patient was seen in room APA04/APA04 and the patient's care was started at 12:07 PM.      Chief Complaint  Patient presents with  . Shortness of Breath   Patient is a 81 y.o. female presenting with shortness of breath. The history is provided by the patient. No language interpreter was used.  Shortness of Breath Duration:  9 hours Associated symptoms: no abdominal pain, no chest pain, no cough, no headaches and no rash      HPI Comments: [name] is a 81 y.o. female with a history of 5 CABG's who presents to the Emergency Department complaining of constant SOB that began about 9 hours ago when she was lying that caused her to wake up from sleep. She has never had SOB like this before.Patient went to see her PCP Dr. Mordecai Rasmussenondigeo, who sent her here because she was bradycardic in the 30s. Patient stopped taking Digoxin about 1 year ago. Patient is taking aspirin, Coreg, iron supplement, Prilosec, and Janumet; no other medication. She denies any pain.  Patient's cardiologist is Dr. Wyline MoodBranch. She used to see Dr. Modesto CharonWong until he stopped working.   Past Medical History  Diagnosis Date  . Type II or unspecified type diabetes mellitus without mention of complication, not stated as uncontrolled   . Unspecified hemorrhoids without mention of complication   . Depression   . CAD (coronary artery disease)     s/p PCI to LAD '08, Severe 3V CAD by cath 03/27/12 --> CABG  . Mitral regurgitation     moderate by cath, mild by echo 03/2012  . Persistent atrial fibrillation   . Osteoarthrosis, unspecified whether generalized or localized, unspecified site   . Personal history of other diseases of digestive system   . Unspecified glaucoma   . Unspecified essential hypertension   . Thyroid disease     hypothyroidism  . CHF (congestive heart failure)    Past Surgical  History  Procedure Laterality Date  . Coronary artery bypass graft  04/03/2012    Procedure: CORONARY ARTERY BYPASS GRAFTING (CABG);  Surgeon: Kerin PernaPeter Van Trigt, MD;  [facility];  Service: Open Heart Surgery;  Laterality: N/A;  CABG x four;  using left internal mammary artery and bilateral greater saphenous veins  . Mitral valve repair  04/03/2012    Procedure: MITRAL VALVE REPAIR (MVR);  Surgeon: Kerin PernaPeter Van Trigt, MD;  [facility];  Service: Open Heart Surgery;  Laterality: N/A;  . Ercp  2008  . Cholecystectomy  12/2006  . Left heart catheterization with coronary angiogram N/A 03/27/2012    Procedure: LEFT HEART CATHETERIZATION WITH CORONARY ANGIOGRAM;  Surgeon: Herby Abrahamhomas D Stuckey, MD;  [facility];  Service: Cardiovascular;  Laterality: N/A;   History reviewed. No pertinent family history. History  Substance Use Topics  . Smoking status: Former Smoker    Types: Cigarettes    Quit date: 09/03/2004  . Smokeless tobacco: Never Used  . Alcohol Use: No   OB History    No data available     Review of Systems  Constitutional: Negative for appetite change and fatigue.  HENT: Negative for congestion, ear discharge and sinus pressure.   Eyes: Negative for discharge.  Respiratory: Positive for shortness of breath. Negative for cough.   Cardiovascular: Negative for chest pain.  Gastrointestinal: Negative for  abdominal pain and diarrhea.  Genitourinary: Negative for frequency and hematuria.  Musculoskeletal: Negative for back pain.  Skin: Negative for rash.  Neurological: Negative for seizures and headaches.  Psychiatric/Behavioral: Negative for hallucinations.      Allergies  Lisinopril; Quinine; and Sulfonamide derivatives  Home Medications   Prior to Admission medications   Medication Sig Start Date End Date Taking? Authorizing Provider  aspirin 325 MG tablet Take 325 mg by mouth daily.   Yes Historical Provider, MD  carvedilol (COREG) 6.25 MG tablet Take 1 tablet (6.25  mg total) by mouth 2 (two) times daily. 01/15/15  Yes Antoine Poche, MD  ferrous sulfate 325 (65 FE) MG tablet Take 325 mg by mouth daily with breakfast.   Yes Historical Provider, MD  omeprazole (PRILOSEC) 20 MG capsule Take 20 mg by mouth daily.   Yes Historical Provider, MD  sitaGLIPtan-metformin (JANUMET) 50-500 MG per tablet Take 1 tablet by mouth 2 (two) times daily with a meal.   Yes Historical Provider, MD   BP 132/48 mmHg  Pulse 34  Temp(Src) 97.7 F (36.5 C)  Resp 18  Ht  (1.727 m)  Wt 140 lb (63.504 kg)  BMI 21.29 kg/m2  SpO2 95% Physical Exam  Constitutional: She is oriented to person, place, and time. She appears well-developed.  HENT:  Head: Normocephalic.  Eyes: Conjunctivae and EOM are normal. No scleral icterus.  Neck: Neck supple. No thyromegaly present.  Cardiovascular: Normal rate and regular rhythm.  Exam reveals no gallop and no friction rub.   No murmur heard. Pulmonary/Chest: No stridor. She has no wheezes. She has no rales. She exhibits no tenderness.  Abdominal: She exhibits no distension. There is no tenderness. There is no rebound.  Musculoskeletal: Normal range of motion. She exhibits no edema.  Lymphadenopathy:    She has no cervical adenopathy.  Neurological: She is oriented to person, place, and time. She exhibits normal muscle tone. Coordination normal.  Skin: No rash noted. No erythema.  Psychiatric: She has a normal mood and affect. Her behavior is normal.  Nursing note and vitals reviewed.   ED Course  Procedures (including critical care time)  DIAGNOSTIC STUDIES: Oxygen Saturation is 95% on room air, normal by my interpretation.    COORDINATION OF CARE: 12:11 PM - Discussed treatment plan with pt at bedside and pt agreed to plan.   Labs Review Labs Reviewed  TROPONIN I  COMPREHENSIVE METABOLIC PANEL  CBC WITH DIFFERENTIAL/PLATELET  DIGOXIN LEVEL  PROTIME-INR    Imaging Review No results found.   EKG  Interpretation   Date/Time:  Monday February 24 2015 11:58:49 EST Ventricular Rate:  34 PR Interval:  223 QRS Duration: 127 QT Interval:  605 QTC Calculation: 455 R Axis:   117 Text Interpretation:  Possible atrial arrhythmia Nonspecific  intraventricular conduction delay Anterior infarct, old Confirmed by  Jerri Hargadon  MD, Jenette Rayson [id number]) on 02/24/2015 12:18:45 PM     CRITICAL CARE Performed by: Johnanthony Wilden L Total critical care time: 35 Critical care time was exclusive of separately billable procedures and treating other patients. Critical care was necessary to treat or prevent imminent or life-threatening deterioration. Critical care was time spent personally by me on the following activities: development of treatment plan with patient and/or surrogate as well as nursing, discussions with consultants, evaluation of patient's response to treatment, examination of patient, obtaining history from patient or surrogate, ordering and performing treatments and interventions, ordering and review of laboratory studies, ordering and review of radiographic studies, pulse  oximetry and re-evaluation of patient's condition.  MDM   Final diagnoses:  None    2nd degree heart block,  Pt seen by cardiology, will admit [facility] The chart was scribed for me under my direct supervision.  I personally performed the history, physical, and medical decision making and all procedures in the evaluation of this patient.Alison Lennert, MD 02/24/15 1434

## 2015-02-24 NOTE — Consult Note (Addendum)
CARDIOLOGY CONSULT NOTE   Patient ID: [name] MRN: [id number] DOB/AGE: [birth year] 79 y.o.  Admit Date: 02/24/2015 Referring Physician: ER-MD Primary Physician: Isabella Stalling, MD Consulting Cardiologist: Prentice Docker MD Primary Cardiologist Dina Rich MD Reason for Consultation: Bradycardia   Clinical Summary [name] is a 81 y.o.female with known history of CAD-PCI to LAD, CABG X 4 (LIMA to LAD, SVG to Cx, SVG to Ramus, SVG to PDA, 03/2012)  , ICM, Systolic CM EF of 45%-50%, (no ACE due to angioedema), who presented to ER after waking up around 3 am short of breath. She states that last evening after church, she and her family went to [facility] and she ate a salad. After eating it, she felt some chest pressure and had a lot of belching. She took a PPI. This am she threw up the salad. She took her medications this am which included coreg, 6.25 mg BID, metformin, iron and ASA around 10 am. She denies chest pain or near syncope. She has some lightheadedness. States her breathing is much better since coming to ER.   In ER she was given 0.5 mg IV of atropine with no increase in HR.  BP 132/48. No evidence of anemia, awaiting chemistries. She states the breathing and the belching were not at all like the feeling she had when she had prior MI. She felt significant pain at that time.  She is comfortable now.   Allergies  Allergen Reactions  . Lisinopril Anaphylaxis  . Quinine Other (See Comments)    Pt states heart attack-like symptoms  . Sulfonamide Derivatives Other (See Comments)    unknown    Medications Scheduled Medications:    Infusions:    PRN Medications:     Past Medical History  Diagnosis Date  . Type II or unspecified type diabetes mellitus without mention of complication, not stated as uncontrolled   . Unspecified hemorrhoids without mention of complication   . Depression   . CAD (coronary artery disease)     s/p PCI to LAD '08, Severe  3V CAD by cath 03/27/12 --> CABG  . Mitral regurgitation     moderate by cath, mild by echo 03/2012  . Persistent atrial fibrillation   . Osteoarthrosis, unspecified whether generalized or localized, unspecified site   . Personal history of other diseases of digestive system   . Unspecified glaucoma   . Unspecified essential hypertension   . Thyroid disease     hypothyroidism  . CHF (congestive heart failure)     Past Surgical History  Procedure Laterality Date  . Coronary artery bypass graft  04/03/2012    Procedure: CORONARY ARTERY BYPASS GRAFTING (CABG);  Surgeon: Kerin Perna, MD;  [facility];  Service: Open Heart Surgery;  Laterality: N/A;  CABG x four;  using left internal mammary artery and bilateral greater saphenous veins  . Mitral valve repair  04/03/2012    Procedure: MITRAL VALVE REPAIR (MVR);  Surgeon: Kerin Perna, MD;  [facility];  Service: Open Heart Surgery;  Laterality: N/A;  . Ercp  2008  . Cholecystectomy  12/2006  . Left heart catheterization with coronary angiogram N/A 03/27/2012    Procedure: LEFT HEART CATHETERIZATION WITH CORONARY ANGIOGRAM;  Surgeon: Herby Abraham, MD;  Location: [facility] CATH LAB;  Service: Cardiovascular;  Laterality: N/A;    History reviewed. No pertinent family history.  Social History [name] reports that she quit smoking about 10 years ago. Her smoking use included Cigarettes.  She has never used smokeless tobacco. [name] reports that she does not drink alcohol.  Review of Systems Complete review of systems are found to be negative unless outlined in H&P above.  Physical Examination Blood pressure 132/48, pulse 34, temperature 97.7 F (36.5 C), resp. rate 18, height  (1.727 m), weight 63.504 kg (140 lb), SpO2 95 %. No intake or output data in the 24 hours ending 02/24/15 1228  Telemetry: Complete HB rate of 30-32 bpm.  ZOX:[id number], no chest pain or dyspnea.  HEENT: Conjunctiva and lids normal, oropharynx  clear with moist mucosa. Neck: Supple, no elevated JVP or carotid bruits, no thyromegaly. Lungs:Some crackles in the right bases, no systolic murmur, no pericardial rub. Abdomen: Soft, nontender, no hepatomegaly, bowel sounds present, no guarding or rebound. Extremities: No pitting edema, distal pulses 2+. Skin: Warm and dry. Musculoskeletal: No kyphosis. Neuropsychiatric: Alert and oriented x3, affect grossly appropriate.  Prior Cardiac Testing/Procedures 1.  Cardiac Cath 03/2012 Coronary angiography: Coronary dominance: right  Left mainstem: Long and tapers distally with about 20% narrowing  Left anterior descending (LAD): Totally occluded proximal to stent site and just beyond diagonal. The diagonal has an 80% occlusion. The distal LAD is collateralized both from the RCA and with bridging collaterals. The diagonal is large and is suitable for a grafting procedure.   Left circumflex (LCx): The AV circumflex comes off after a long left main, and with a steep bend. The vessel has 95% narrowing and is hazy, likely the culprit. Distally there are tandem lesions of 80% leading to a large marginal. The first OM has modest irregularity with probably 50% segmental plaque proximally, but not critical.   Right coronary artery (RCA): Heavily calcified. There is an 75% proximal lesion, 80% prox mid lesion, and diffuse disease in the distal portion of the mid vessel. After the acute marginal, there is subtotal RCA prior to the PDA which has collateral flow. The PDA and PLA are both large. The LAD fills by collaterals.   Left ventriculography: Left ventricular systolic function is low normal, LVEF is estimated at 50%. There is at least moderate MR seen in both the LAO and RAO projections. There is modest hypokinesis at the inferior base.   Final Conclusions:  1. Severe three vessel CAD 2. Mild reduced LV function with moderate MR  03/2012 CABG PROCEDURE: Procedure(s) (LRB):Left  femoral a line; CORONARY ARTERY BYPASS GRAFTING (CABG)x4 (LIMA to LAD,SVG to Circumflex,SVG to Ramus Intermediate, and SVG to PDA) with EVH from the fight thigh and calf and left thigh;MITRAL VALVE REPAIR (MVR) using a Physio II Mitral ring Annuloplasty (size 26 mm)  Lab Results  Basic Metabolic Panel: No results for input(s): NA, K, CL, CO2, GLUCOSE, BUN, CREATININE, CALCIUM, MG, PHOS in the last 168 hours.  Liver Function Tests: No results for input(s): AST, ALT, ALKPHOS, BILITOT, PROT, ALBUMIN in the last 168 hours.  CBC:  Recent Labs Lab 02/24/15 1200  WBC 6.9  NEUTROABS 3.9  HGB 12.8  HCT 40.4  MCV 88.2  PLT 198    Cardiac Enzymes: Pending BNP: Pending  Radiology: Pending   ECG: Compete heart block, rate of 32 bpm   Impression and Recommendations  1.Second Degree Heat Block, Mobitz II:  Patient did not respond to 0.5 mg of Atropine. Will hold coreg as discussed with ER MD by Dr. Purvis Sheffield, Check TSH, begin low dose dopamine 2.5 mg to increase HR until BB wash out. She is stable and is without symptoms at this time. She  does not need transfer to [facility] at this time. If HR worsens may need temporary pacemaker. On Zole now. Awaiting chemistries. Troponin pending.   2. CAD: History of 4 vessel CABG in 2013 due to severe 3 vessel disease. She has been medically compliant on BB, ASA, do not see where she is on a statin. No allergies to this. Will check fasting lipids and LFT's in am. No BB for now in the setting of CHB.   3. Post Op atrial fib: No longer on coumadin.   4. Diabetes  5. Anemia: No evidence per labs drawn in ER.   6. Nausea and Vomiting: May be related to the bradycardia, but had some belching and chest pressure after eating a salad last evening, with vomiting this am. Troponin's will be cycled.   Signed: Bettey MareKathryn M. Keylen Uzelac NP AACC  02/24/2015, 12:28 PM   The patient was seen and examined, and I agree with the assessment and plan as documented above,  with modifications as noted below. 81 yr old woman with aforementioned history with complaints of shortness of breath at PCP's office and sent to ED, where she was found to be bradycardic. She is normally very active and was painting rooms inside the house last week. Denies chest pain, dizziness, and syncope.  Examination of the ECG demonstrates what appears to be second degree heart block (Mobitz II) with 3:1 conduction with ventricular escape rhythm in the 30 bpm range, with subtle P waves buried in the T waves most prominently seen in lead V5. Prior ECG on 01/14/14 demonstrated sinus rhythm with normal QRS duration. She is on Coreg 6.25 mg bid for history of CAD with ischemic cardiomyopathy, 45-50% on 03/13/14. CBC is normal and BMET, troponin, and TSH are pending.  At present, BP is stable and she is actually feeling somewhat better and certainly not in any distress. My current plan is to allow the Coreg to wash out of her system to see if AV conduction improves over the next few days, albeit it may not. Will hold off on the addition of dopamine/dobutamine for now. Atropine will have no effect as the block is below the AV node. If she were to decompensate, I [facility] either a temporary pacing wire vs permanent pacemaker placement. I have discussed my plan with my electrophysiology colleagues as well as Dr. Kerry HoughMemon, and both are in agreement with this plan.  Prentice DockerSuresh Koneswaran, MD, [facility] London[facility]ACC  Co-Sign MD

## 2015-02-24 NOTE — H&P (Signed)
[facility] History and Physical  Alison Bakesancy P Esterly WJX:[id number] DOB: 1932/03/17 DOA: 02/24/2015  Referring physician:  PCP: Isabella StallingNDIEGO,RICHARD M, MD   Chief Complaint: sob  HPI: [name] is a 81 y.o. female with past medical history that includes CAD status post 5 CABG, diabetes, heart failure, hypertension, thyroid disease presents to the emergency department with chief complaint of shortness of breath. Initial evaluation reveals bradycardia as a result of second-degree heart block.  Patient reports sudden onset of shortness of breath around 3 AM. Yesterday was a normal day she denies any recent illness sick contacts chest pain shortness of breath cough fever chills. She went to church she went out to eat after that. She does report after eating experiencing some chest pressure so she took her PPI. Wearing after being awakened with shortness of breath she did have emesis that consisted of undigested food. She denies coffee ground emesis. She denies palpitations, headache visual disturbances syncope or near-syncope. She denies abdominal pain constipation melena. She does report 3 somewhat loose stools this morning.  Work up in the emergency department includes complete metabolic panel significant for serum glucose of 247 otherwise unremarkable, complete blood count is unremarkable, BNP of 423, initial troponin is negative, TSH is 4.55. Chest x-ray enlargement of cardiac silhouette with pulmonary vascular congestion. EKG possible atrial arrhythmia Nonspecific intraventricular conduction delay anterior infarct, old. She is afebrile with a stable blood pressure she is not hypoxic. In the emergency department she is given Lasix intravenously and atropine as well. She is evaluated by cardiology who opine second-degree heart block with 3-1 conduction with ventricular escape rhythm in the 30 bpm range. Recommend admission with observation. Cardiology note indicates that given she is asymptomatic and  currently stable will keep patient here at any [facility] and monitor while Coreg clears her system. If there is no improvement within a reasonable time frame consider [facility] either temporary pacing wire or permanent pacemaker.    Review of Systems:  10 point review of systems completed and all systems are negative except as indicated in the history of present illness.   Past Medical History  Diagnosis Date  . Type II or unspecified type diabetes mellitus without mention of complication, not stated as uncontrolled   . Unspecified hemorrhoids without mention of complication   . Depression   . CAD (coronary artery disease)     s/p PCI to LAD '08, Severe 3V CAD by cath 03/27/12 --> CABG  . Mitral regurgitation     moderate by cath, mild by echo 03/2012  . Persistent atrial fibrillation   . Osteoarthrosis, unspecified whether generalized or localized, unspecified site   . Personal history of other diseases of digestive system   . Unspecified glaucoma   . Unspecified essential hypertension   . Thyroid disease     hypothyroidism  . CHF (congestive heart failure)    Past Surgical History  Procedure Laterality Date  . Coronary artery bypass graft  04/03/2012    Procedure: CORONARY ARTERY BYPASS GRAFTING (CABG);  Surgeon: Kerin PernaPeter Van Trigt, MD;  [facility];  Service: Open Heart Surgery;  Laterality: N/A;  CABG x four;  using left internal mammary artery and bilateral greater saphenous veins  . Mitral valve repair  04/03/2012    Procedure: MITRAL VALVE REPAIR (MVR);  Surgeon: Kerin PernaPeter Van Trigt, MD;  [facility];  Service: Open Heart Surgery;  Laterality: N/A;  . Ercp  2008  . Cholecystectomy  12/2006  . Left heart catheterization  with coronary angiogram N/A 03/27/2012    Procedure: LEFT HEART CATHETERIZATION WITH CORONARY ANGIOGRAM;  Surgeon: Herby Abraham, MD;  Location: [facility] CATH LAB;  Service: Cardiovascular;  Laterality: N/A;   Social History:  reports that she quit smoking  about 10 years ago. Her smoking use included Cigarettes. She has never used smokeless tobacco. She reports that she does not drink alcohol or use illicit drugs.  Allergies  Allergen Reactions  . Lisinopril Anaphylaxis  . Quinine Other (See Comments)    Pt states heart attack-like symptoms  . Sulfonamide Derivatives Other (See Comments)    unknown    History reviewed. No pertinent family history. family medical history reviewed and is noncontributory to the admission of this elderly lady  Prior to Admission medications   Medication Sig Start Date End Date Taking? Authorizing Provider  aspirin 325 MG tablet Take 325 mg by mouth daily.   Yes Historical Provider, MD  carvedilol (COREG) 6.25 MG tablet Take 1 tablet (6.25 mg total) by mouth 2 (two) times daily. 01/15/15  Yes Antoine Poche, MD  ferrous sulfate 325 (65 FE) MG tablet Take 325 mg by mouth daily with breakfast.   Yes Historical Provider, MD  omeprazole (PRILOSEC) 20 MG capsule Take 20 mg by mouth daily.   Yes Historical Provider, MD  sitaGLIPtan-metformin (JANUMET) 50-500 MG per tablet Take 1 tablet by mouth 2 (two) times daily with a meal.   Yes Historical Provider, MD   Physical Exam: Filed Vitals:   02/24/15 1300 02/24/15 1330 02/24/15 1400 02/24/15 1430  BP: 114/44 117/44 109/42 103/69  Pulse: 33 35 32 32  Temp:      Resp: Height:      Weight:      SpO2: 95% 90% 95% 97%    Wt Readings from Last 3 Encounters:  02/24/15 63.504 kg (140 lb)  09/03/14 61.689 kg (136 lb)  04/20/14 63.504 kg (140 lb)    General:  Appears calm and comfortable Eyes: PERRL, normal lids, irises & conjunctiva ENT: grossly normal hearing, lips & tongue Neck: no LAD, masses or thyromegaly Cardiovascular: The cardiac but mostly regular I hear no no m/r/g. Trace LE edema.  Respiratory:Normal respiratory effort. Sounds distant hear no wheeze no rhonchi Abdomen: soft, ntnd positive bowel sounds Skin: no rash or induration seen  on limited exam Musculoskeletal: grossly normal tone BUE/BLE Psychiatric: grossly normal mood and affect, speech fluent and appropriate Neurologic: grossly non-focal. each clear facial symmetry           Labs on Admission:  Basic Metabolic Panel:  Recent Labs Lab 02/24/15 1200  NA 135  K 4.1  CL 106  CO2 26  GLUCOSE 247*  BUN 20  CREATININE 0.98  CALCIUM 9.0   Liver Function Tests:  Recent Labs Lab 02/24/15 1200  AST 22  ALT 19  ALKPHOS 78  BILITOT 0.3  PROT 7.0  ALBUMIN 3.7   No results for input(s): LIPASE, AMYLASE in the last 168 hours. No results for input(s): AMMONIA in the last 168 hours. CBC:  Recent Labs Lab 02/24/15 1200  WBC 6.9  NEUTROABS 3.9  HGB 12.8  HCT 40.4  MCV 88.2  PLT 198   Cardiac Enzymes:  Recent Labs Lab 02/24/15 1200  TROPONINI <0.03    BNP (last 3 results)  Recent Labs  02/24/15 1200  BNP 423.0*    ProBNP (last 3 results) No results for input(s): PROBNP in the last 8760 hours.  CBG: No  results for input(s): GLUCAP in the last 168 hours.  Radiological Exams on Admission: Dg Chest Portable 1 View  02/24/2015   CLINICAL DATA:  Shortness of breath, bradycardia, history type 2 diabetes, coronary artery disease post CABG, essential hypertension, CHF, atrial fibrillation, mitral regurgitation  EXAM: PORTABLE CHEST - 1 VIEW  COMPARISON:  Portable exam 1218 hours compared to 05/10/2012  FINDINGS: External pacing leads in EKG leads.  Enlargement of cardiac silhouette post CABG.  Pulmonary vascular congestion.  Lungs grossly clear.  No pleural effusion or pneumothorax.  Bones demineralized.  IMPRESSION: Enlargement of cardiac silhouette with pulmonary vascular congestion.  No acute infiltrate.   Electronically Signed   By: Ulyses Southward M.D.   On: 02/24/2015 13:08    EKG: Independently reviewed. As noted above  Assessment/Plan Principal Problem:   Bradycardia/ second-degree heart block. Followed by Dr. Wyline Mood. Evaluated in the  emergency department by Dr.koneswaren. Admit for observation will hold Coreg. According to notes cardiology does not recommend dopamine at this time. We'll cycle troponins check a TSH. Hopefully once Coreg has cleared her system heart rate will improve but if not will consider [facility]. Of note, cardiology note indicates block located below AV node therefore atropine will not be effective. Active Problems:   Diabetes: On oral agents. Will obtain a hemoglobin A1c. Car modified diet. Sliding scale insulin for optimal control    Coronary artery disease; status post 5 CABG. Home meds include aspirin Coreg. Will continue the aspirin and hold the Coreg as noted above.    Chronic atrial fibrillation: Status post surgery. No longer on anticoagulation.    Chronic systolic heart failure: Beta blocker as noted above. Echo 1 year ago with mild LVH EF of 55% and grade 2 diastolic dysfunction. Will monitor daily weights and measure intake and output. Of note she was given 20 mg of Lasix IV in the emergency department as her chest x-ray showed some vascular congestion. ProBNP 423.      Code Status: full DVT Prophylaxis: Family Communication: husband and daughter at bedside Disposition Plan: home when ready  Time spent: 95 minutes  [facility] Pager [phone]

## 2015-02-24 NOTE — ED Notes (Signed)
Pt denies any symptoms other than feeling tired at this time.

## 2015-02-24 NOTE — Progress Notes (Signed)
Patient has voided 4-5 times with a total of , says that she just cannot get it all out, bladder scanner result showed 500+ remaining in the bladder. Order received to insert foley catheter.

## 2015-02-24 NOTE — ED Notes (Signed)
Cardiologist at bedside speaking with pt.

## 2015-02-25 ENCOUNTER — Other Ambulatory Visit (HOSPITAL_COMMUNITY): Payer: Self-pay

## 2015-02-25 DIAGNOSIS — J810 Acute pulmonary edema: Secondary | ICD-10-CM

## 2015-02-25 DIAGNOSIS — I5022 Chronic systolic (congestive) heart failure: Secondary | ICD-10-CM

## 2015-02-25 DIAGNOSIS — I25812 Atherosclerosis of bypass graft of coronary artery of transplanted heart without angina pectoris: Secondary | ICD-10-CM

## 2015-02-25 DIAGNOSIS — I442 Atrioventricular block, complete: Principal | ICD-10-CM

## 2015-02-25 DIAGNOSIS — R197 Diarrhea, unspecified: Secondary | ICD-10-CM

## 2015-02-25 LAB — GLUCOSE, CAPILLARY
GLUCOSE-CAPILLARY: 122 mg/dL — AB (ref 70–99)
GLUCOSE-CAPILLARY: 154 mg/dL — AB (ref 70–99)
GLUCOSE-CAPILLARY: 112 mg/dL — AB (ref 70–99)
Glucose-Capillary: 144 mg/dL — ABNORMAL HIGH (ref 70–99)
Glucose-Capillary: 119 mg/dL — ABNORMAL HIGH (ref 70–99)
Glucose-Capillary: 147 mg/dL — ABNORMAL HIGH (ref 70–99)

## 2015-02-25 LAB — HEMOGLOBIN A1C
HEMOGLOBIN A1C: 7.7 % — AB (ref 4.8–5.6)
Mean Plasma Glucose: 174 mg/dL

## 2015-02-25 LAB — T4, FREE: FREE T4: 0.82 ng/dL (ref 0.80–1.80)

## 2015-02-25 LAB — T3, FREE: T3, Free: 3 pg/mL (ref 2.0–4.4)

## 2015-02-25 LAB — CLOSTRIDIUM DIFFICILE BY PCR: Toxigenic C. Difficile by PCR: NEGATIVE

## 2015-02-25 LAB — TROPONIN I: Troponin I: <0.03 ng/mL (ref <0.031)

## 2015-02-25 MED ORDER — ZOLPIDEM TARTRATE 5 MG PO TABS: 2.5000 mg | ORAL_TABLET | Freq: Every evening | ORAL | Status: DC | PRN

## 2015-02-25 NOTE — Progress Notes (Signed)
Call[facility] report to Sorrent[facility]al, [facility].  Verbalized understanding.  [facility]. Schonewitz, Candelaria StagersLeigh Anne 02/25/2015

## 2015-02-25 NOTE — Care Management Note (Signed)
    Page 1 of 1   02/25/2015     11:34:00 AM CARE MANAGEMENT NOTE 02/25/2015  Patient:  [name],[name]   Account Number:  [id number]  Date Initiated:  02/25/2015  Documentation initiated by:  Sharrie RothmanBLACKWELL,Diasia Henken C  Subjective/Objective Assessment:   Pt admitted from home with bradycardia. Pt lives with her husband and will return home at discharge. Pt is independent with ADL's.     Action/Plan:   [facility]. CM on receiving unit to follow for discharge planning needs.   Anticipated DC Date:  02/25/2015   Anticipated DC Plan:  ACUTE TO ACUTE TRANS      DC Planning Services  CM consult      Choice offered to / List presented to:             Status of service:  Completed, signed off Medicare Important Message given?   (If response is "NO", the following Medicare IM given date fields will be blank) Date Medicare IM given:   Medicare IM given by:   Date Additional Medicare IM given:   Additional Medicare IM given by:    Discharge Disposition:  ACUTE TO ACUTE TRANS  Per UR Regulation:    If discussed at Long Length of Stay Meetings, dates discussed:    Comments:  02/25/15 1130 Arlyss Queenammy Gerard Cantara, RN BSN CM

## 2015-02-25 NOTE — Progress Notes (Signed)
[facility]  Called receiving rn to inform of transport. Schonewitz, Candelaria StagersLeigh Anne 02/25/2015

## 2015-02-25 NOTE — Progress Notes (Signed)
Patient transferred from [facility]. Patient comfortable. No complaints. Patient in Heart Block. HR in 30's. Patient asymptomatic. BP stable. Zoll pads placed on patient. MD rounded on pt. Patient to go for pacemaker tomorrow (02/26/15).   Rise PaganiniURRY, Sammie Denner R, RN

## 2015-02-25 NOTE — Progress Notes (Signed)
C-diff pcr is NEGATIVE

## 2015-02-25 NOTE — Progress Notes (Signed)
Consulting cardiologist:Lella Mullany, Darliss RidgelSuresh MD Primary Cardiologist: Dina RichBranch, Jonathan MD  Cardiology Specific Problem List: 1.Second degree heart block 2. CAD 3. ICM  Subjective:    Didn't sleep, still short of breath, frequent diarrhea overnight.  Objective:   Temp:  [97.7 F (36.5 C)-98.9 F (37.2 C)] 98.2 F (36.8 C) (03/01 0400) Pulse Rate:  [31-71] 33 (03/01 0700) Resp:  [13-28] 14 (03/01 0700) BP: (102-162)/(34-69) 111/43 mmHg (03/01 0700) SpO2:  [88 %-97 %] 89 % (03/01 0700) Weight:  [63.504 kg (140 lb)-72.3 kg (159 lb 6.3 oz)] 71.7 kg (158 lb 1.1 oz) (03/01 0500)    Filed Weights   02/24/15 1202 02/24/15 1707 02/25/15 0500  Weight: 63.504 kg (140 lb) 72.3 kg (159 lb 6.3 oz) 71.7 kg (158 lb 1.1 oz)    Intake/Output Summary (Last 24 hours) at 02/25/15 0728 Last data filed at 02/25/15 0600  Gross per 24 hour  Intake  632.5 ml  Output   1150 ml  Net -517.5 ml    Telemetry: Sinus bradycardia, Second degree block 2:1   Exam:  General: No acute distress.Frail.  HEENT: Conjunctiva and lids normal, oropharynx clear.  Lungs: Clear to auscultation, nonlabored.Short of breath with speaking.   Cardiac: Bradycardic, No elevated JVP or bruits. RRR, no gallop or rub.   Abdomen: Hyperactive  bowel sounds, nontender, nondistended.  Extremities: No pitting edema, distal pulses full.  Neuropsychiatric: Alert and oriented x3, affect appropriate.   Lab Results:  Basic Metabolic Panel:  Recent Labs Lab 02/24/15 1200  NA 135  K 4.1  CL 106  CO2 26  GLUCOSE 247*  BUN 20  CREATININE 0.98  CALCIUM 9.0    Liver Function Tests:  Recent Labs Lab 02/24/15 1200  AST 22  ALT 19  ALKPHOS 78  BILITOT 0.3  PROT 7.0  ALBUMIN 3.7    CBC:  Recent Labs Lab 02/24/15 1200  WBC 6.9  HGB 12.8  HCT 40.4  MCV 88.2  PLT 198    Cardiac Enzymes:  Recent Labs Lab 02/24/15 1200 02/24/15 1957 02/25/15 0426  TROPONINI <0.03 <0.03 <0.03    Coagulation:  Recent Labs Lab 02/24/15 1200  INR 0.98    Radiology: Dg Chest Portable 1 View  02/24/2015   CLINICAL DATA:  Shortness of breath, bradycardia, history type 2 diabetes, coronary artery disease post CABG, essential hypertension, CHF, atrial fibrillation, mitral regurgitation  EXAM: PORTABLE CHEST - 1 VIEW  COMPARISON:  Portable exam 1218 hours compared to 05/10/2012  FINDINGS: External pacing leads in EKG leads.  Enlargement of cardiac silhouette post CABG.  Pulmonary vascular congestion.  Lungs grossly clear.  No pleural effusion or pneumothorax.  Bones demineralized.  IMPRESSION: Enlargement of cardiac silhouette with pulmonary vascular congestion.  No acute infiltrate.   Electronically Signed   By: Ulyses SouthwardMark  Boles M.D.   On: 02/24/2015 13:08     ZOX:WRUEAVECG:Repeat this am.   Medications:   Scheduled Medications: . aspirin  325 mg Oral Daily  . enoxaparin (LOVENOX) injection  40 mg Subcutaneous Q24H  . ferrous sulfate  325 mg Oral Q breakfast  . insulin aspart  0-15 Units Subcutaneous TID WC  . insulin aspart  0-5 Units Subcutaneous QHS  . pantoprazole  40 mg Oral Daily  . sodium chloride  3 mL Intravenous Q12H    Infusions: . sodium chloride 50 mL/hr at 02/25/15 0600    PRN Medications: sodium chloride, acetaminophen **OR** acetaminophen, alum & mag hydroxide-simeth, HYDROcodone-acetaminophen, ondansetron **OR** ondansetron (ZOFRAN) IV, senna-docusate, sodium chloride,  traZODone   Assessment and Plan:   1. Second degree heart block: Rechecking EKG this am, looks like CHB on telemetry but will verify with full EKG. She is tired and short of breath. I will begin low dose O2, 2.liters via [place]. Waiting for BB wash out. TSH high limits of normal.  No chest pain. Continue close monitoring for now. Discussed PPM with the patient as a possibility if HR did not improve within the next 24-48 hrs. She is interested this if necessary.   2. CAD: Hx of CABG 4 vessel in 2013.:  Troponin is negative X 3. Labs are essentially unremarkable. No chest pain. Will repeat echocardiogram once HR is improved.   3. Diarrhea: Three episodes of this overnight, incontinent. Will check stool culture and for C-diff.   4. CHF: Likely related to bradycardia. Was given lasix X 1 in ER. Pulmonary vascular congestion on CXR.   Bettey Mare. Lawrence NP [facility]  02/25/2015, 7:28 AM   The patient was seen and examined, and I agree with the assessment and plan as documented above, with modifications as noted below. Patient is having intermittent 3rd degree AV block with 3:1 Mobitz II also seen. HR remains between 30-35 bpm. Last dose of Coreg was morning of 2/29. Symptomatically improved since yesterday. Even by the time beta blocker has had time to wash out of her system, I suspect she will still require permanent pacemaker implantation. I have spoken with Dr. Berton Mount (EP) regarding this, and will transfer patient to [facility] today.

## 2015-02-25 NOTE — Progress Notes (Signed)
UR chart review completed.  

## 2015-02-26 ENCOUNTER — Encounter (HOSPITAL_COMMUNITY)
Admission: EM | Disposition: A | Discharge status: Home / Self Care | Payer: Self-pay | Source: Home / Self Care | Attending: Internal Medicine

## 2015-02-26 ENCOUNTER — Encounter (HOSPITAL_COMMUNITY): Payer: Self-pay | Admitting: Cardiology

## 2015-02-26 DIAGNOSIS — I442 Atrioventricular block, complete: Secondary | ICD-10-CM

## 2015-02-26 DIAGNOSIS — I495 Sick sinus syndrome: Secondary | ICD-10-CM

## 2015-02-26 DIAGNOSIS — I480 Paroxysmal atrial fibrillation: Secondary | ICD-10-CM

## 2015-02-26 HISTORY — PX: PERMANENT PACEMAKER INSERTION: SHX5480

## 2015-02-26 LAB — CBC
HEMATOCRIT: 38.1 % (ref 36.0–46.0)
HEMOGLOBIN: 12.3 g/dL (ref 12.0–15.0)
MCH: 28.5 pg (ref 26.0–34.0)
MCHC: 32.3 g/dL (ref 30.0–36.0)
MCV: 88.4 fL (ref 78.0–100.0)
Platelets: 141 10*3/uL — ABNORMAL LOW (ref 150–400)
RBC: 4.31 MIL/uL (ref 3.87–5.11)
RDW: 14.2 % (ref 11.5–15.5)
WBC: 6.9 10*3/uL (ref 4.0–10.5)

## 2015-02-26 LAB — BASIC METABOLIC PANEL
Anion gap: 5 (ref 5–15)
BUN: 14 mg/dL (ref 6–23)
CO2: 24 mmol/L (ref 19–32)
CREATININE: 0.88 mg/dL (ref 0.50–1.10)
Calcium: 8.5 mg/dL (ref 8.4–10.5)
Chloride: 108 mmol/L (ref 96–112)
GFR calc non Af Amer: 60 mL/min — ABNORMAL LOW (ref >90)
GFR, EST AFRICAN AMERICAN: 70 mL/min — AB (ref >90)
Glucose, Bld: 235 mg/dL — ABNORMAL HIGH (ref 70–99)
Potassium: 4.2 mmol/L (ref 3.5–5.1)
Sodium: 137 mmol/L (ref 135–145)

## 2015-02-26 LAB — GLUCOSE, CAPILLARY
Glucose-Capillary: 140 mg/dL — ABNORMAL HIGH (ref 70–99)
Glucose-Capillary: 179 mg/dL — ABNORMAL HIGH (ref 70–99)
Glucose-Capillary: 114 mg/dL — ABNORMAL HIGH (ref 70–99)
Glucose-Capillary: 155 mg/dL — ABNORMAL HIGH (ref 70–99)

## 2015-02-26 SURGERY — PERMANENT PACEMAKER INSERTION
Anesthesia: LOCAL

## 2015-02-26 MED ORDER — SODIUM CHLORIDE 0.9 % IJ SOLN
3.0000 mL | Freq: Two times a day (BID) | INTRAMUSCULAR | Status: DC
  Administered 2015-02-27: 3 mL via INTRAVENOUS

## 2015-02-26 MED ORDER — LIDOCAINE HCL (PF) 1 % IJ SOLN
INTRAMUSCULAR | Status: AC
  Filled 2015-02-26: qty 60

## 2015-02-26 MED ORDER — SODIUM CHLORIDE 0.9 % IV SOLN: 250.0000 mL | INTRAVENOUS | Status: DC | PRN

## 2015-02-26 MED ORDER — ASPIRIN 81 MG PO CHEW: 81.0000 mg | CHEWABLE_TABLET | Freq: Once | ORAL | Status: DC

## 2015-02-26 MED ORDER — ACETAMINOPHEN 325 MG PO TABS: 325.0000 mg | ORAL_TABLET | ORAL | Status: DC | PRN

## 2015-02-26 MED ORDER — CEFAZOLIN SODIUM 1-5 GM-% IV SOLN
1.0000 g | Freq: Four times a day (QID) | INTRAVENOUS | Status: AC
  Administered 2015-02-26 – 2015-02-27 (×3): 1 g via INTRAVENOUS
  Filled 2015-02-26 (×3): qty 50

## 2015-02-26 MED ORDER — CEFAZOLIN SODIUM-DEXTROSE 2-3 GM-% IV SOLR
INTRAVENOUS | Status: AC
  Filled 2015-02-26: qty 100

## 2015-02-26 MED ORDER — SODIUM CHLORIDE 0.45 % IV SOLN: INTRAVENOUS | Status: DC

## 2015-02-26 MED ORDER — SODIUM CHLORIDE 0.9 % IR SOLN
80.0000 mg | Status: DC
  Filled 2015-02-26 (×2): qty 2

## 2015-02-26 MED ORDER — HYDROCODONE-ACETAMINOPHEN 5-325 MG PO TABS: 1.0000 | ORAL_TABLET | ORAL | Status: DC | PRN

## 2015-02-26 MED ORDER — SODIUM CHLORIDE 0.9 % IR SOLN: 80.0000 mg | Status: DC

## 2015-02-26 MED ORDER — VANCOMYCIN HCL IN DEXTROSE 1-5 GM/200ML-% IV SOLN: 1000.0000 mg | INTRAVENOUS | Status: DC

## 2015-02-26 MED ORDER — SODIUM CHLORIDE 0.9 % IV SOLN: INTRAVENOUS | Status: DC

## 2015-02-26 MED ORDER — ONDANSETRON HCL 4 MG/2ML IJ SOLN: 4.0000 mg | Freq: Four times a day (QID) | INTRAMUSCULAR | Status: DC | PRN

## 2015-02-26 MED ORDER — CEFAZOLIN SODIUM-DEXTROSE 2-3 GM-% IV SOLR
2.0000 g | INTRAVENOUS | Status: DC
  Filled 2015-02-26: qty 50

## 2015-02-26 MED ORDER — SODIUM CHLORIDE 0.9 % IJ SOLN: 3.0000 mL | INTRAMUSCULAR | Status: DC | PRN

## 2015-02-26 NOTE — Progress Notes (Signed)
  Echocardiogram 2D Echocardiogram has been performed.  Leta JunglingCooper, Rossy Virag M 02/26/2015, 1:59 PM

## 2015-02-26 NOTE — Op Note (Signed)
SURGEON:  Hillis RangeJames Zaid Tomes, MD     PREPROCEDURE DIAGNOSIS:  Symptomatic complete heart block    POSTPROCEDURE DIAGNOSIS:  Symptomatic complete heart block     PROCEDURES:   1.  Pacemaker implantation.     INTRODUCTION:  [name] is a 81 y.o. female with a history of complete heart block who presents today for pacemaker implantation.  The patient reports intermittent episodes of dizziness and fatigue over the past few days.  AV nodal agents (coreg) have been appropriately washed out without improvement in conduction.  No reversible causes have been identified.  The patient therefore presents today for pacemaker implantation.     DESCRIPTION OF PROCEDURE:  Informed written consent was obtained, and  the patient was brought to the electrophysiology lab in a fasting state.  The patient required no sedation for the procedure today.  The patients left chest was prepped and draped in the usual sterile fashion by the EP lab staff. The skin overlying the left deltopectoral region was infiltrated with lidocaine for local analgesia.  A 4-cm incision was made over the left deltopectoral region.  A left subcutaneous pacemaker pocket was fashioned using a combination of sharp and blunt dissection. Electrocautery was required to assure hemostasis.    RA/RV Lead Placement: The left axillary vein was therefore cannulated.  Through the left axillary vein, a [employer] model [id number] (serial number  [id number]) right atrial lead and a [facility] [employer] Medical model [id number] (serial number  [id number]) right ventricular lead were advanced with fluoroscopic visualization into the right atrial appendage and right ventricular apex positions respectively.  Initial atrial lead P- waves measured 1.9 mV with impedance of 529 ohms and a threshold of 1.9 V at 0.5 msec.  Right ventricular lead R-waves measured 6.7 mV with an impedance of 833 ohms and a threshold of 0.4 V at 0.5 msec.  Both leads were secured to the pectoralis fascia  using #2-0 silk over the suture sleeves.   Device Placement:  The leads were then connected to a [facility] Jude [employer] Assurity DR  model [id number] (serial number  [id number] ) pacemaker.  The pocket was irrigated with copious gentamicin solution.  The pacemaker was then placed into the pocket.  The pocket was then closed in 2 layers with 2.0 Vicryl suture for the subcutaneous and subcuticular layers.  Steri-  Strips and a sterile dressing were then applied. EBL<5010ml.  There were no early apparent complications.     CONCLUSIONS:   1. Successful implantation of a [employer] Jude Medical Assurity DR  dual-chamber pacemaker for symptomatic complete heart block  2. No early apparent complications.        Permanent Pacemaker Indication: Documented non-reversible symptomatic bradycardia due to second degree and/or third degree atrioventricular block.     Hillis RangeJames Rynlee Lisbon, MD 02/26/2015 4:15 PM

## 2015-02-26 NOTE — H&P (View-Only) (Signed)
Reason for Consult: COMPLETE HEART BLOCK   Referring Physician: Dr. Bronson Ing  PCP:  Alison Curet, MD  Primary Cardiologist:Dr. Loleta [name] P [name] is an 81 y.o. female.    Chief Complaint: SOB woke her from sleep  HPI:  81 y.o.female with known history of CAD-PCI to LAD, CABG X 4 (LIMA to LAD, SVG to Cx, SVG to Ramus, SVG to PDA, 0/3474) , ICM, Systolic CM EF of 25%-95%, (no ACE due to angioedema), who presented to ER after waking up around 3 am yesterday short of breath. She stated that the evening before after church, she and her family went to [name] and she ate a salad. After eating it, she felt some chest pressure and had a lot of belching. She took a PPI. This am she threw up the salad. She took her medications this am which included coreg, 6.25 mg BID, metformin, iron and ASA around 10 am. She denies chest pain or near syncope. She has some lightheadedness. States her breathing is much better since coming to ER.   She arruved in ER and was noted to have 3:1 heart block  In ER she was given 0.5 mg IV of atropine with no increase in HR. BP 132/48. No evidence of anemia, awaiting chemistries. No improvement in rhythm with withdrawal of betablockers and she is transferred for pacemaker      She states the breathing and the belching were not at all like the feeling she  had when she had prior MI. She felt significant pain at that time.  ECG now CHB   She was seen at [name] and then transferred to [name].  She remains with HR 34 -CHB.  Troponin is neg.  This AM feels better after some sleep. She did have BK this AM.  Past Medical History  Diagnosis Date  . Type II or unspecified type diabetes mellitus without mention of complication, not stated as uncontrolled   . Unspecified hemorrhoids without mention of complication   . Depression   . CAD (coronary artery disease)     s/p PCI to LAD '08, Severe 3V CAD by cath 03/27/12 --> CABG  . Mitral  regurgitation     moderate by cath, mild by echo 03/2012  . Persistent atrial fibrillation   . Osteoarthrosis, unspecified whether generalized or localized, unspecified site   . Personal history of other diseases of digestive system   . Unspecified glaucoma   . Unspecified essential hypertension   . Thyroid disease     hypothyroidism  . CHF (congestive heart failure)     Past Surgical History  Procedure Laterality Date  . Coronary artery bypass graft  04/03/2012    Procedure: CORONARY ARTERY BYPASS GRAFTING (CABG);  Surgeon: Ivin Poot, MD;  Location: [place];  Service: Open Heart Surgery;  Laterality: N/A;  CABG x four;  using left internal mammary artery and bilateral greater saphenous veins  . Mitral valve repair  04/03/2012    Procedure: MITRAL VALVE REPAIR (MVR);  Surgeon: Ivin Poot, MD;  Location: [place];  Service: Open Heart Surgery;  Laterality: N/A;  . Ercp  2008  . Cholecystectomy  12/2006  . Left heart catheterization with coronary angiogram N/A 03/27/2012    Procedure: LEFT HEART CATHETERIZATION WITH CORONARY ANGIOGRAM;  Surgeon: Hillary Bow, MD;  Location: [name] CATH LAB;  Service: Cardiovascular;  Laterality: N/A;    Family History  Problem Relation Age of Onset  .  Heart disease Mother   . Heart disease Father 69    CABG  . Arrhythmia Brother     slow HR off and on since his 37s   Social History:  reports that she quit smoking about 10 years ago. Her smoking use included Cigarettes. She has never used smokeless tobacco. She reports that she does not drink alcohol or use illicit drugs.  Allergies:  Allergies  Allergen Reactions  . Lisinopril Anaphylaxis  . Quinine Other (See Comments)    Pt states heart attack-like symptoms  . Sulfonamide Derivatives Other (See Comments)    unknown    Medications Prior to Admission  Medication Sig Dispense Refill  . aspirin 325 MG tablet Take 325 mg by mouth daily.    . carvedilol (COREG) 6.25 MG tablet Take 1 tablet  (6.25 mg total) by mouth 2 (two) times daily. 180 tablet 1  . ferrous sulfate 325 (65 FE) MG tablet Take 325 mg by mouth daily with breakfast.    . omeprazole (PRILOSEC) 20 MG capsule Take 20 mg by mouth daily.    . sitaGLIPtan-metformin (JANUMET) 50-500 MG per tablet Take 1 tablet by mouth 2 (two) times daily with a meal.      Results for orders placed or performed during the [name] encounter of 02/24/15 (from the past 48 hour(s))  Troponin I     Status: None   Collection Time: 02/24/15 12:00 PM  Result Value Ref Range   Troponin I <0.03 <0.031 ng/mL    Comment:        NO INDICATION OF MYOCARDIAL INJURY.   Comprehensive metabolic panel     Status: Abnormal   Collection Time: 02/24/15 12:00 PM  Result Value Ref Range   Sodium 135 135 - 145 mmol/L   Potassium 4.1 3.5 - 5.1 mmol/L   Chloride 106 96 - 112 mmol/L   CO2 26 19 - 32 mmol/L   Glucose, Bld 247 (H) 70 - 99 mg/dL   BUN 20 6 - 23 mg/dL   Creatinine, Ser 0.98 0.50 - 1.10 mg/dL   Calcium 9.0 8.4 - 10.5 mg/dL   Total Protein 7.0 6.0 - 8.3 g/dL   Albumin 3.7 3.5 - 5.2 g/dL   AST 22 0 - 37 U/L   ALT 19 0 - 35 U/L   Alkaline Phosphatase 78 39 - 117 U/L   Total Bilirubin 0.3 0.3 - 1.2 mg/dL   GFR calc non Af Amer 53 (L) >90 mL/min   GFR calc Af Amer 61 (L) >90 mL/min    Comment: (NOTE) The eGFR has been calculated using the CKD EPI equation. This calculation has not been validated in all clinical situations. eGFR's persistently <90 mL/min signify possible Chronic Kidney Disease.    Anion gap 3 (L) 5 - 15  CBC with Differential     Status: None   Collection Time: 02/24/15 12:00 PM  Result Value Ref Range   WBC 6.9 4.0 - 10.5 K/uL   RBC 4.58 3.87 - 5.11 MIL/uL   Hemoglobin 12.8 12.0 - 15.0 g/dL   HCT 40.4 36.0 - 46.0 %   MCV 88.2 78.0 - 100.0 fL   MCH 27.9 26.0 - 34.0 pg   MCHC 31.7 30.0 - 36.0 g/dL   RDW 14.3 11.5 - 15.5 %   Platelets 198 150 - 400 K/uL   Neutrophils Relative % 57 43 - 77 %   Neutro Abs 3.9 1.7 -  7.7 K/uL   Lymphocytes Relative 33 12 - 46 %  Lymphs Abs 2.3 0.7 - 4.0 K/uL   Monocytes Relative 9 3 - 12 %   Monocytes Absolute 0.6 0.1 - 1.0 K/uL   Eosinophils Relative 1 0 - 5 %   Eosinophils Absolute 0.1 0.0 - 0.7 K/uL   Basophils Relative 0 0 - 1 %   Basophils Absolute 0.0 0.0 - 0.1 K/uL  Digoxin level     Status: Abnormal   Collection Time: 02/24/15 12:00 PM  Result Value Ref Range   Digoxin Level <0.2 (L) 0.8 - 2.0 ng/mL  Protime-INR     Status: None   Collection Time: 02/24/15 12:00 PM  Result Value Ref Range   Prothrombin Time 13.1 11.6 - 15.2 seconds   INR 0.98 0.00 - 1.49  TSH     Status: Abnormal   Collection Time: 02/24/15 12:00 PM  Result Value Ref Range   TSH 4.550 (H) 0.350 - 4.500 uIU/mL  Brain natriuretic peptide     Status: Abnormal   Collection Time: 02/24/15 12:00 PM  Result Value Ref Range   B Natriuretic Peptide 423.0 (H) 0.0 - 100.0 pg/mL  Hemoglobin A1c     Status: Abnormal   Collection Time: 02/24/15 12:00 PM  Result Value Ref Range   Hgb A1c MFr Bld 7.7 (H) 4.8 - 5.6 %    Comment: (NOTE)         Pre-diabetes: 5.7 - 6.4         Diabetes: >6.4         Glycemic control for adults with diabetes: <7.0    Mean Plasma Glucose 174 mg/dL    Comment: (NOTE) Performed At: [facility] [address] [place], [place] [id number] Lindon Romp MD [id number]   T4, free     Status: None   Collection Time: 02/24/15 12:00 PM  Result Value Ref Range   Free T4 0.82 0.80 - 1.80 ng/dL    Comment: Performed at [facility]  T3, free     Status: None   Collection Time: 02/24/15 12:00 PM  Result Value Ref Range   T3, Free 3.0 2.0 - 4.4 pg/mL    Comment: (NOTE) Performed At: [name] [place], [place] [id number] Lindon Romp MD [id number]   MRSA PCR Screening     Status: None   Collection Time: 02/24/15  4:00 PM  Result Value Ref Range   MRSA by PCR NEGATIVE NEGATIVE    Comment:        The GeneXpert  MRSA Assay (FDA approved for NASAL specimens only), is one component of a comprehensive MRSA colonization surveillance program. It is not intended to diagnose MRSA infection nor to guide or monitor treatment for MRSA infections.   Glucose, capillary     Status: Abnormal   Collection Time: 02/24/15  4:57 PM  Result Value Ref Range   Glucose-Capillary 101 (H) 70 - 99 mg/dL   Comment 1 Notify RN   Troponin I     Status: None   Collection Time: 02/24/15  7:57 PM  Result Value Ref Range   Troponin I <0.03 <0.031 ng/mL    Comment:        NO INDICATION OF MYOCARDIAL INJURY.   Glucose, capillary     Status: Abnormal   Collection Time: 02/24/15  9:04 PM  Result Value Ref Range   Glucose-Capillary 122 (H) 70 - 99 mg/dL   Comment 1 Notify RN   Troponin I     Status: None   Collection  Time: 02/25/15  4:26 AM  Result Value Ref Range   Troponin I <0.03 <0.031 ng/mL    Comment:        NO INDICATION OF MYOCARDIAL INJURY.   Glucose, capillary     Status: Abnormal   Collection Time: 02/25/15  7:31 AM  Result Value Ref Range   Glucose-Capillary 144 (H) 70 - 99 mg/dL   Comment 1 Notify RN   Stool culture     Status: None (Preliminary result)   Collection Time: 02/25/15  9:36 AM  Result Value Ref Range   Specimen Description STOOL    Special Requests NONE    Culture PENDING    Report Status PENDING   Clostridium Difficile by PCR     Status: None   Collection Time: 02/25/15  9:36 AM  Result Value Ref Range   C difficile by pcr NEGATIVE NEGATIVE  Glucose, capillary     Status: Abnormal   Collection Time: 02/25/15 11:11 AM  Result Value Ref Range   Glucose-Capillary 154 (H) 70 - 99 mg/dL   Comment 1 Notify RN   Glucose, capillary     Status: Abnormal   Collection Time: 02/25/15  3:48 PM  Result Value Ref Range   Glucose-Capillary 112 (H) 70 - 99 mg/dL   Comment 1 Notify RN   Glucose, capillary     Status: Abnormal   Collection Time: 02/25/15  5:33 PM  Result Value Ref Range    Glucose-Capillary 119 (H) 70 - 99 mg/dL   Comment 1 Capillary Specimen   Glucose, capillary     Status: Abnormal   Collection Time: 02/25/15  9:32 PM  Result Value Ref Range   Glucose-Capillary 147 (H) 70 - 99 mg/dL  Glucose, capillary     Status: Abnormal   Collection Time: 02/26/15  8:23 AM  Result Value Ref Range   Glucose-Capillary 140 (H) 70 - 99 mg/dL   Dg Chest Portable 1 View  02/24/2015   CLINICAL DATA:  Shortness of breath, bradycardia, history type 2 diabetes, coronary artery disease post CABG, essential hypertension, CHF, atrial fibrillation, mitral regurgitation  EXAM: PORTABLE CHEST - 1 VIEW  COMPARISON:  Portable exam 1218 hours compared to 05/10/2012  FINDINGS: External pacing leads in EKG leads.  Enlargement of cardiac silhouette post CABG.  Pulmonary vascular congestion.  Lungs grossly clear.  No pleural effusion or pneumothorax.  Bones demineralized.  IMPRESSION: Enlargement of cardiac silhouette with pulmonary vascular congestion.  No acute infiltrate.   Electronically Signed   By: Lavonia Dana M.D.   On: 02/24/2015 13:08    ROS: General:no colds or fevers, + weight gain if wt today correct Skin:no rashes or ulcers HEENT:no blurred vision, no congestion CV:see HPI PUL:see HPI GI:no diarrhea constipation or melena, no indigestion GU:no hematuria, no dysuria MS:no joint pain, no claudication Neuro:no syncope, no lightheadedness- some dizziness yesterday Endo:+ diabetes, no thyroid disease   Blood pressure 147/44, pulse 34, temperature 98.9 F (37.2 C), temperature source Oral, resp. rate 12, height $RemoveBe'5\' 8"'pfTytmKra$  (1.727 m), weight 164 lb 3.2 oz (74.481 kg), SpO2 98 %.  Wt Readings from Last 3 Encounters:  02/26/15 164 lb 3.2 oz (74.481 kg)  09/03/14 136 lb (61.689 kg)  04/20/14 140 lb (63.504 kg)    Alert and oriented in no acute distress HENT- normal Eyes- EOMI, without scleral icterus Skin- warm and dry; without rashes LN-neg Neck- supple without thyromegaly, JVP8,  carotids brisk and full without bruits Back-without CVAT or kyphosis Lungs-clear to auscultation CV-Slow  Regular rate and rhythm, nl S1 and widely split S2,2/6 murmurs gallops or rubs,  t Abd-soft with active bowel sounds; no midline pulsation or hepatomegaly Pulses-intact femoral and distal MKS-without gross deformity Neuro- Ax O, CN3-12 intact, grossly normal motor and sensory function Affect engaging  ECG  Completer hart block Assessment/Plan Principal Problem:   Bradycardia- symptomatic CHB today, plan for permanent pacemaker- Dr. Caryl Comes discussed pacer with the pt.  Currently with oxygen no SOB. Will check Echo today.  Pt agreeable to proceed with cath.  Active Problems:   Diabetes- stable on SSI   Coronary artery disease, s/p CABG, no chest pain and troponins negative.   Hx of PAF was maintaining SR    Chronic systolic heart failure-euvolemic currently   Complete heart block- symptomatic with SOB    Mobitz type 2 second degree heart block- now complete  Pt with hx of MBz 2 and now complete heart block persisting in the absence of potentally contributing medications  Will proceed with pacemaker today  The benefits and risks were reviewed including but not limited to death,  perforation, infection, lead dislodgement and device malfunction.  The patient understands agrees and is willing to proceed.   Evaro Practitioner Certified [place] Pager [phone] or after 5pm or weekends call [phone] 02/26/2015, 10:49 AM

## 2015-02-26 NOTE — Interval H&P Note (Signed)
History and Physical Interval Note:  02/26/2015 2:04 PM  [name] P Knippenberg  has presented today for surgery, with the diagnosis of Complete Heart Block  The various methods of treatment have been discussed with the patient and family. After consideration of risks, benefits and other options for treatment, the patient has consented to  Procedure(s): PERMANENT PACEMAKER INSERTION (N/A) as a surgical intervention .  The patient's history has been reviewed, patient examined, no change in status, stable for surgery.  I have reviewed the patient's chart and labs.  Questions were answered to the patient's satisfaction.     Symptomatic complete heart block without reversible cause. I would therefore recommend pacemaker implantation at this time.  Risks, benefits, alternatives to pacemaker implantation were discussed in detail with the patient, her spouse, and son today. The patient understands that the risks include but are not limited to bleeding, infection, pneumothorax, perforation, tamponade, vascular damage, renal failure, MI, stroke, death,  and lead dislodgement and wishes to proceed at this time.     Hillis RangeJames Alexander Aument

## 2015-02-26 NOTE — Consult Note (Signed)
Reason for Consult: COMPLETE HEART BLOCK   Referring Physician: Dr. Bronson Ing  PCP:  Maricela Curet, MD  Primary Cardiologist:Dr. Loleta Dicker [name] is an 81 y.o. female.    Chief Complaint: SOB woke her from sleep  HPI:  81 y.o.female with known history of CAD-PCI to LAD, CABG X 4 (LIMA to LAD, SVG to Cx, SVG to Ramus, SVG to PDA, 12/6382) , ICM, Systolic CM EF of 66%-59%, (no ACE due to angioedema), who presented to ER after waking up around 3 am yesterday short of breath. She stated that the evening before after church, she and her family went to [facility] and she ate a salad. After eating it, she felt some chest pressure and had a lot of belching. She took a PPI. This am she threw up the salad. She took her medications this am which included coreg, 6.25 mg BID, metformin, iron and ASA around 10 am. She denies chest pain or near syncope. She has some lightheadedness. States her breathing is much better since coming to ER.   She arruved in ER and was noted to have 3:1 heart block  In ER she was given 0.5 mg IV of atropine with no increase in HR. BP 132/48. No evidence of anemia, awaiting chemistries. No improvement in rhythm with withdrawal of betablockers and she is transferred for pacemaker      She states the breathing and the belching were not at all like the feeling she  had when she had prior MI. She felt significant pain at that time.  ECG now CHB   She was seen at [facility] and then transferred to [facility].  She remains with HR 34 -CHB.  Troponin is neg.  This AM feels better after some sleep. She did have BK this AM.  Past Medical History  Diagnosis Date  . Type II or unspecified type diabetes mellitus without mention of complication, not stated as uncontrolled   . Unspecified hemorrhoids without mention of complication   . Depression   . CAD (coronary artery disease)     s/Alison PCI to LAD '08, Severe 3V CAD by cath 03/27/12 --> CABG  . Mitral  regurgitation     moderate by cath, mild by echo 03/2012  . Persistent atrial fibrillation   . Osteoarthrosis, unspecified whether generalized or localized, unspecified site   . Personal history of other diseases of digestive system   . Unspecified glaucoma   . Unspecified essential hypertension   . Thyroid disease     hypothyroidism  . CHF (congestive heart failure)     Past Surgical History  Procedure Laterality Date  . Coronary artery bypass graft  04/03/2012    Procedure: CORONARY ARTERY BYPASS GRAFTING (CABG);  Surgeon: Ivin Poot, MD;  Location: [place];  Service: Open Heart Surgery;  Laterality: N/A;  CABG x four;  using left internal mammary artery and bilateral greater saphenous veins  . Mitral valve repair  04/03/2012    Procedure: MITRAL VALVE REPAIR (MVR);  Surgeon: Ivin Poot, MD;  Location: [place];  Service: Open Heart Surgery;  Laterality: N/A;  . Ercp  2008  . Cholecystectomy  12/2006  . Left heart catheterization with coronary angiogram N/A 03/27/2012    Procedure: LEFT HEART CATHETERIZATION WITH CORONARY ANGIOGRAM;  Surgeon: Hillary Bow, MD;  Location: [facility] CATH LAB;  Service: Cardiovascular;  Laterality: N/A;    Family History  Problem Relation Age of Onset  .  Heart disease Mother   . Heart disease Father 66    CABG  . Arrhythmia Brother     slow HR off and on since his 31s   Social History:  reports that she quit smoking about 10 years ago. Her smoking use included Cigarettes. She has never used smokeless tobacco. She reports that she does not drink alcohol or use illicit drugs.  Allergies:  Allergies  Allergen Reactions  . Lisinopril Anaphylaxis  . Quinine Other (See Comments)    Pt states heart attack-like symptoms  . Sulfonamide Derivatives Other (See Comments)    unknown    Medications Prior to Admission  Medication Sig Dispense Refill  . aspirin 325 MG tablet Take 325 mg by mouth daily.    . carvedilol (COREG) 6.25 MG tablet Take 1 tablet  (6.25 mg total) by mouth 2 (two) times daily. 180 tablet 1  . ferrous sulfate 325 (65 FE) MG tablet Take 325 mg by mouth daily with breakfast.    . omeprazole (PRILOSEC) 20 MG capsule Take 20 mg by mouth daily.    . sitaGLIPtan-metformin (JANUMET) 50-500 MG per tablet Take 1 tablet by mouth 2 (two) times daily with a meal.      Results for orders placed or performed during the hospital encounter of 02/24/15 (from the past 48 hour(s))  Troponin I     Status: None   Collection Time: 02/24/15 12:00 PM  Result Value Ref Range   Troponin I <0.03 <0.031 ng/mL    Comment:        NO INDICATION OF MYOCARDIAL INJURY.   Comprehensive metabolic panel     Status: Abnormal   Collection Time: 02/24/15 12:00 PM  Result Value Ref Range   Sodium 135 135 - 145 mmol/L   Potassium 4.1 3.5 - 5.1 mmol/L   Chloride 106 96 - 112 mmol/L   CO2 26 19 - 32 mmol/L   Glucose, Bld 247 (H) 70 - 99 mg/dL   BUN 20 6 - 23 mg/dL   Creatinine, Ser 0.98 0.50 - 1.10 mg/dL   Calcium 9.0 8.4 - 10.5 mg/dL   Total Protein 7.0 6.0 - 8.3 g/dL   Albumin 3.7 3.5 - 5.2 g/dL   AST 22 0 - 37 U/L   ALT 19 0 - 35 U/L   Alkaline Phosphatase 78 39 - 117 U/L   Total Bilirubin 0.3 0.3 - 1.2 mg/dL   GFR calc non Af Amer 53 (L) >90 mL/min   GFR calc Af Amer 61 (L) >90 mL/min    Comment: (NOTE) The eGFR has been calculated using the CKD EPI equation. This calculation has not been validated in all clinical situations. eGFR's persistently <90 mL/min signify possible Chronic Kidney Disease.    Anion gap 3 (L) 5 - 15  CBC with Differential     Status: None   Collection Time: 02/24/15 12:00 PM  Result Value Ref Range   WBC 6.9 4.0 - 10.5 K/uL   RBC 4.58 3.87 - 5.11 MIL/uL   Hemoglobin 12.8 12.0 - 15.0 g/dL   HCT 40.4 36.0 - 46.0 %   MCV 88.2 78.0 - 100.0 fL   MCH 27.9 26.0 - 34.0 pg   MCHC 31.7 30.0 - 36.0 g/dL   RDW 14.3 11.5 - 15.5 %   Platelets 198 150 - 400 K/uL   Neutrophils Relative % 57 43 - 77 %   Neutro Abs 3.9 1.7 -  7.7 K/uL   Lymphocytes Relative 33 12 - 46 %  Lymphs Abs 2.3 0.7 - 4.0 K/uL   Monocytes Relative 9 3 - 12 %   Monocytes Absolute 0.6 0.1 - 1.0 K/uL   Eosinophils Relative 1 0 - 5 %   Eosinophils Absolute 0.1 0.0 - 0.7 K/uL   Basophils Relative 0 0 - 1 %   Basophils Absolute 0.0 0.0 - 0.1 K/uL  Digoxin level     Status: Abnormal   Collection Time: 02/24/15 12:00 PM  Result Value Ref Range   Digoxin Level <0.2 (L) 0.8 - 2.0 ng/mL  Protime-INR     Status: None   Collection Time: 02/24/15 12:00 PM  Result Value Ref Range   Prothrombin Time 13.1 11.6 - 15.2 seconds   INR 0.98 0.00 - 1.49  TSH     Status: Abnormal   Collection Time: 02/24/15 12:00 PM  Result Value Ref Range   TSH 4.550 (H) 0.350 - 4.500 uIU/mL  Brain natriuretic peptide     Status: Abnormal   Collection Time: 02/24/15 12:00 PM  Result Value Ref Range   B Natriuretic Peptide 423.0 (H) 0.0 - 100.0 pg/mL  Hemoglobin A1c     Status: Abnormal   Collection Time: 02/24/15 12:00 PM  Result Value Ref Range   Hgb A1c MFr Bld 7.7 (H) 4.8 - 5.6 %    Comment: (NOTE)         Pre-diabetes: 5.7 - 6.4         Diabetes: >6.4         Glycemic control for adults with diabetes: <7.0    Mean Plasma Glucose 174 mg/dL    Comment: (NOTE) Performed At: [facility] [address] [place], [place] [id number] Lindon Romp MD [id number]   T4, free     Status: None   Collection Time: 02/24/15 12:00 PM  Result Value Ref Range   Free T4 0.82 0.80 - 1.80 ng/dL    Comment: Performed at [facility]  T3, free     Status: None   Collection Time: 02/24/15 12:00 PM  Result Value Ref Range   T3, Free 3.0 2.0 - 4.4 pg/mL    Comment: (NOTE) Performed At: [facility] [place], [place] [id number] Lindon Romp MD [id number]   MRSA PCR Screening     Status: None   Collection Time: 02/24/15  4:00 PM  Result Value Ref Range   MRSA by PCR NEGATIVE NEGATIVE    Comment:        The GeneXpert  MRSA Assay (FDA approved for NASAL specimens only), is one component of a comprehensive MRSA colonization surveillance program. It is not intended to diagnose MRSA infection nor to guide or monitor treatment for MRSA infections.   Glucose, capillary     Status: Abnormal   Collection Time: 02/24/15  4:57 PM  Result Value Ref Range   Glucose-Capillary 101 (H) 70 - 99 mg/dL   Comment 1 Notify RN   Troponin I     Status: None   Collection Time: 02/24/15  7:57 PM  Result Value Ref Range   Troponin I <0.03 <0.031 ng/mL    Comment:        NO INDICATION OF MYOCARDIAL INJURY.   Glucose, capillary     Status: Abnormal   Collection Time: 02/24/15  9:04 PM  Result Value Ref Range   Glucose-Capillary 122 (H) 70 - 99 mg/dL   Comment 1 Notify RN   Troponin I     Status: None   Collection  Time: 02/25/15  4:26 AM  Result Value Ref Range   Troponin I <0.03 <0.031 ng/mL    Comment:        NO INDICATION OF MYOCARDIAL INJURY.   Glucose, capillary     Status: Abnormal   Collection Time: 02/25/15  7:31 AM  Result Value Ref Range   Glucose-Capillary 144 (H) 70 - 99 mg/dL   Comment 1 Notify RN   Stool culture     Status: None (Preliminary result)   Collection Time: 02/25/15  9:36 AM  Result Value Ref Range   Specimen Description STOOL    Special Requests NONE    Culture PENDING    Report Status PENDING   Clostridium Difficile by PCR     Status: None   Collection Time: 02/25/15  9:36 AM  Result Value Ref Range   C difficile by pcr NEGATIVE NEGATIVE  Glucose, capillary     Status: Abnormal   Collection Time: 02/25/15 11:11 AM  Result Value Ref Range   Glucose-Capillary 154 (H) 70 - 99 mg/dL   Comment 1 Notify RN   Glucose, capillary     Status: Abnormal   Collection Time: 02/25/15  3:48 PM  Result Value Ref Range   Glucose-Capillary 112 (H) 70 - 99 mg/dL   Comment 1 Notify RN   Glucose, capillary     Status: Abnormal   Collection Time: 02/25/15  5:33 PM  Result Value Ref Range    Glucose-Capillary 119 (H) 70 - 99 mg/dL   Comment 1 Capillary Specimen   Glucose, capillary     Status: Abnormal   Collection Time: 02/25/15  9:32 PM  Result Value Ref Range   Glucose-Capillary 147 (H) 70 - 99 mg/dL  Glucose, capillary     Status: Abnormal   Collection Time: 02/26/15  8:23 AM  Result Value Ref Range   Glucose-Capillary 140 (H) 70 - 99 mg/dL   Dg Chest Portable 1 View  02/24/2015   CLINICAL DATA:  Shortness of breath, bradycardia, history type 2 diabetes, coronary artery disease post CABG, essential hypertension, CHF, atrial fibrillation, mitral regurgitation  EXAM: PORTABLE CHEST - 1 VIEW  COMPARISON:  Portable exam 1218 hours compared to 05/10/2012  FINDINGS: External pacing leads in EKG leads.  Enlargement of cardiac silhouette post CABG.  Pulmonary vascular congestion.  Lungs grossly clear.  No pleural effusion or pneumothorax.  Bones demineralized.  IMPRESSION: Enlargement of cardiac silhouette with pulmonary vascular congestion.  No acute infiltrate.   Electronically Signed   By: Lavonia Dana M.D.   On: 02/24/2015 13:08    ROS: General:no colds or fevers, + weight gain if wt today correct Skin:no rashes or ulcers HEENT:no blurred vision, no congestion CV:see HPI PUL:see HPI GI:no diarrhea constipation or melena, no indigestion GU:no hematuria, no dysuria MS:no joint pain, no claudication Neuro:no syncope, no lightheadedness- some dizziness yesterday Endo:+ diabetes, no thyroid disease   Blood pressure 147/44, pulse 34, temperature 98.9 F (37.2 C), temperature source Oral, resp. rate 12, height $RemoveBe'5\' 8"'fgtGKLlyM$  (1.727 m), weight 164 lb 3.2 oz (74.481 kg), SpO2 98 %.  Wt Readings from Last 3 Encounters:  02/26/15 164 lb 3.2 oz (74.481 kg)  09/03/14 136 lb (61.689 kg)  04/20/14 140 lb (63.504 kg)    Alert and oriented in no acute distress HENT- normal Eyes- EOMI, without scleral icterus Skin- warm and dry; without rashes LN-neg Neck- supple without thyromegaly, JVP8,  carotids brisk and full without bruits Back-without CVAT or kyphosis Lungs-clear to auscultation CV-Slow  Regular rate and rhythm, nl S1 and widely split S2,2/6 murmurs gallops or rubs,  t Abd-soft with active bowel sounds; no midline pulsation or hepatomegaly Pulses-intact femoral and distal MKS-without gross deformity Neuro- Ax O, CN3-12 intact, grossly normal motor and sensory function Affect engaging  ECG  Completer hart block Assessment/Plan Principal Problem:   Bradycardia- symptomatic CHB today, plan for permanent pacemaker- Dr. Caryl Comes discussed pacer with the pt.  Currently with oxygen no SOB. Will check Echo today.  Pt agreeable to proceed with cath.  Active Problems:   Diabetes- stable on SSI   Coronary artery disease, s/Alison CABG, no chest pain and troponins negative.   Hx of PAF was maintaining SR    Chronic systolic heart failure-euvolemic currently   Complete heart block- symptomatic with SOB    Mobitz type 2 second degree heart block- now complete  Pt with hx of MBz 2 and now complete heart block persisting in the absence of potentally contributing medications  Will proceed with pacemaker today  The benefits and risks were reviewed including but not limited to death,  perforation, infection, lead dislodgement and device malfunction.  The patient understands agrees and is willing to proceed.   [place] Practitioner Certified [place] Pager [phone] or after 5pm or weekends call [phone] 02/26/2015, 10:49 AM

## 2015-02-26 NOTE — Progress Notes (Signed)
Pt. Arrived to unit from Cath lab for Pacer placement, I notified Charge Nurse of pt.'s increased systolic B/P in the 170's prior to transfer to the floor. Pt. Arrived to unit A&O X4 and stable, no signs or symptoms of distress. Pt. Lung sounds had slight expiratory wheezes upper and mid lobes. Pt. Accompanied by family members and belongings were brought in by family members from previous unit.

## 2015-02-27 ENCOUNTER — Inpatient Hospital Stay (HOSPITAL_COMMUNITY): Payer: Medicare HMO

## 2015-02-27 LAB — GLUCOSE, CAPILLARY
GLUCOSE-CAPILLARY: 186 mg/dL — AB (ref 70–99)
Glucose-Capillary: 163 mg/dL — ABNORMAL HIGH (ref 70–99)
Glucose-Capillary: 196 mg/dL — ABNORMAL HIGH (ref 70–99)

## 2015-02-27 NOTE — Discharge Instructions (Signed)
° ° °  Supplemental Discharge Instructions for  Pacemaker/Defibrillator Patients  Activity No heavy lifting or vigorous activity with your left/right arm for 6 to 8 weeks.  Do not raise your left/right arm above your head for one week.  Gradually raise your affected arm as drawn below.           __           03-02-15               03-03-15                       03-04-15                    03-05-15  NO DRIVING for 1 week    ; you may begin driving on   [id number]  .  WOUND CARE - Keep the wound area clean and dry.  Do not get this area wet for one week. No showers for one week; you may shower on  03-05-15   . - The tape/steri-strips on your wound will fall off; do not pull them off.  No bandage is needed on the site.  DO  NOT apply any creams, oils, or ointments to the wound area. - If you notice any drainage or discharge from the wound, any swelling or bruising at the site, or you develop a fever > 101? F after you are discharged home, call the office at once.  Special Instructions - You are still able to use cellular telephones; use the ear opposite the side where you have your pacemaker/defibrillator.  Avoid carrying your cellular phone near your device. - When traveling through airports, show security personnel your identification card to avoid being screened in the metal detectors.  Ask the security personnel to use the hand wand. - Avoid arc welding equipment, MRI testing (magnetic resonance imaging), TENS units (transcutaneous nerve stimulators).  Call the office for questions about other devices. - Avoid electrical appliances that are in poor condition or are not properly grounded. - Microwave ovens are safe to be near or to operate.

## 2015-02-27 NOTE — Progress Notes (Signed)
   SUBJECTIVE: The patient is doing well today.  At this time, she denies chest pain, shortness of breath, or any new concerns.  [name]. aspirin  81 mg Oral Once  .  ceFAZolin (ANCEF) IV  1 g Intravenous Q6H  . ferrous sulfate  325 mg Oral Q breakfast  . insulin aspart  0-15 Units Subcutaneous TID WC  . insulin aspart  0-5 Units Subcutaneous QHS  . pantoprazole  40 mg Oral Daily  . sodium chloride  3 mL Intravenous Q12H      OBJECTIVE: Physical Exam: Filed Vitals:   02/26/15 2046 02/26/15 2202 02/27/15 0024 02/27/15 0515  BP: 154/78 153/77 135/62 117/60  Pulse:   98 98  Temp:    97.2 F (36.2 C)  TempSrc:    Axillary  Resp:  20 18 18   Height:      Weight:    158 lb 4.6 oz (71.8 kg)  SpO2:  96% 98% 100%    Intake/Output Summary (Last 24 hours) at 02/27/15 0711 Last data filed at 02/27/15 0216  Gross per 24 hour  Intake    270 ml  Output   2470 ml  Net  -2200 ml    Telemetry reveals sinus rhythm with V pacing  GEN- The patient is well appearing, alert and oriented x 3 today.   Head- normocephalic, atraumatic Eyes-  Sclera clear, conjunctiva pink Ears- hearing intact Oropharynx- clear Neck- supple,  Lungs- Clear to ausculation bilaterally, normal work of breathing Heart- Regular rate and rhythm  GI- soft, NT, ND, + BS Extremities- no clubbing, cyanosis, or edema Skin- pacemaker pocket is without hematoma Psych- euthymic mood, full affect Neuro- strength and sensation are intact  LABS: Basic Metabolic Panel:  Recent Labs  40/98/1102/29/16 1200 02/26/15 1130  NA 135 137  K 4.1 4.2  CL 106 108  CO2 26 24  GLUCOSE 247* 235*  BUN 20 14  CREATININE 0.98 0.88  CALCIUM 9.0 8.5   Liver Function Tests:  Recent Labs  02/24/15 1200  AST 22  ALT 19  ALKPHOS 78  BILITOT 0.3  PROT 7.0  ALBUMIN 3.7   No results for input(s): LIPASE, AMYLASE in the last 72 hours. CBC:  Recent Labs  02/24/15 1200 02/26/15 1130  WBC 6.9 6.9  NEUTROABS 3.9  --   HGB 12.8 12.3    HCT 40.4 38.1  MCV 88.2 88.4  PLT 198 141*   Cardiac Enzymes:  Recent Labs  02/24/15 1200 02/24/15 1957 02/25/15 0426  TROPONINI <0.03 <0.03 <0.03   BNP: Invalid input(s): POCBNP D-Dimer: No results for input(s): DDIMER in the last 72 hours. Hemoglobin A1C:  Recent Labs  02/24/15 1200  HGBA1C 7.7*   Thyroid Function Tests:  Recent Labs  02/24/15 1200  TSH 4.550*  T3FREE 3.0   ASSESSMENT AND PLAN:   1. Complete heart block Device interrogation is reviewed and normal cxr is pending  2. htn Stable No change required today  3. Cad Stable No change required today  4. afib She carries a history of afib but has not recent been anticoagulated.  Previously on coumadin. We will follow for recurrent afib on ppm interrogation and then make decision about anticoagulation in the future chads2vasc score is at least 6 however she has also had difficulty with anemia   PT to assess Possibly home late this afternoon or tomorrow am   Hillis RangeJames Capria Cartaya, MD 02/27/2015 7:11 AM

## 2015-02-27 NOTE — Evaluation (Addendum)
Physical Therapy Evaluation Patient Details Name: [name] MRN: [id number] DOB: 28-Apr-1932 Today's Date: 02/27/2015   History of Present Illness  Pt adm with heart block and underwent pacer placement on 02/26/15. PMH - CABG,   Clinical Impression  Pt admitted with above diagnosis. Pt currently with functional limitations due to the deficits listed below (see PT Problem List).  Pt will benefit from skilled PT to increase their independence and safety with mobility to allow discharge to the venue listed below.  Pt deficits due to decr activity during hospitalization and pt can return home with family when medically ready. Will follow for PT acutely but no PT after dc needed.     Follow Up Recommendations No PT follow up;Supervision for mobility/OOB    Equipment Recommendations  None recommended by PT    Recommendations for Other Services       Precautions / Restrictions Precautions Precautions: None      Mobility  Bed Mobility Overal bed mobility: Needs Assistance Bed Mobility: Sidelying to Sit   Sidelying to sit: Supervision;HOB elevated       General bed mobility comments: Verbal cues for technique  Transfers Overall transfer level: Needs assistance Equipment used: None Transfers: Sit to/from Stand Sit to Stand: Supervision            Ambulation/Gait Ambulation/Gait assistance: Min guard;Supervision Ambulation Distance (Feet): 250 Feet Assistive device: Rolling walker (2 wheeled) Gait Pattern/deviations: Step-through pattern;Decreased stance time - right;Antalgic   Gait velocity interpretation: Below normal speed for age/gender General Gait Details: Initially slight limp on rt due to foot pain and slightly unsteady that improved with distance and use of walker.  Stairs            Wheelchair Mobility    Modified Rankin (Stroke Patients Only)       Balance Overall balance assessment: Needs assistance Sitting-balance support: No upper extremity  supported Sitting balance-Leahy Scale: Normal     Standing balance support: No upper extremity supported Standing balance-Leahy Scale: Fair                               Pertinent Vitals/Pain Pain Assessment: Faces Faces Pain Scale: Hurts little more Pain Location: pacer site and rt foot Pain Descriptors / Indicators: Sore;Operative site guarding Pain Intervention(s): Limited activity within patient's tolerance;Monitored during session    Home Living Family/patient expects to be discharged to:: Private residence Living Arrangements: Spouse/significant other Available Help at Discharge: Family Type of Home: House Home Access: Ramped entrance     Home Layout: One level Home Equipment: Environmental consultant - 2 wheels;Grab bars - tub/shower      Prior Function Level of Independence: Independent               Hand Dominance        Extremity/Trunk Assessment   Upper Extremity Assessment: Overall WFL for tasks assessed           Lower Extremity Assessment: Overall WFL for tasks assessed         Communication   Communication: No difficulties  Cognition Arousal/Alertness: Awake/alert Behavior During Therapy: WFL for tasks assessed/performed Overall Cognitive Status: Within Functional Limits for tasks assessed                      General Comments      Exercises        Assessment/Plan    PT Assessment Patient needs continued PT services  PT Diagnosis Difficulty walking   PT Problem List Decreased balance;Decreased mobility  PT Treatment Interventions DME instruction;Gait training;Functional mobility training;Therapeutic activities;Therapeutic exercise;Balance training;Patient/family education   PT Goals (Current goals can be found in the Care Plan section) Acute Rehab PT Goals Patient Stated Goal: return home PT Goal Formulation: With patient/family Time For Goal Achievement: 03/06/15 Potential to Achieve Goals: Good    Frequency Min  3X/week   Barriers to discharge        Co-evaluation               End of Session Equipment Utilized During Treatment: Gait belt Activity Tolerance: Patient tolerated treatment well Patient left: in chair;with call bell/phone within reach;with family/visitor present Nurse Communication: Mobility status         Time: [id number] PT Time Calculation (min) (ACUTE ONLY): 18 min   Charges:   PT Evaluation $Initial PT Evaluation Tier I: 1 Procedure     PT G Codes:        Santi Troung 02/27/2015, 4:43 PM  [facility] [phone]

## 2015-02-27 NOTE — Discharge Summary (Signed)
ELECTROPHYSIOLOGY PROCEDURE DISCHARGE SUMMARY    Patient ID: [name] Phommachanh,  MRN: [id number], DOB/AGE: 05/06/1932 81 y.o.  Admit date: 02/24/2015 Discharge date: 02/27/2015  Primary Care Physician: Isabella StallingNDIEGO,RICHARD M, MD Primary Cardiologist: Wyline MoodBranch Electrophysiologist: Ladona Ridgelaylor (to establish in [facility])  Primary Discharge Diagnosis:  Symptomatic bradycardia (complete heart block) status post pacemaker implantation this admission  Secondary Discharge Diagnosis:  1.  CAD s/[name] CABG 2.  Paroxysmal atrial fibrillation 3.  Chronic systolic heart failure 4.  Diabetes 5.  Osteoarthritis  Allergies  Allergen Reactions  . Lisinopril Anaphylaxis  . Penicillins Swelling    Patient states that she had swelling at the injection site after her last Penicillin shot but no swelling in the throat.  . Quinine Other (See Comments)    Pt states heart attack-like symptoms  . Sulfonamide Derivatives Other (See Comments)    unknown     Procedures This Admission:  1.  Echocardiogram on 02/26/15 demonstrated EF 55%, mild MR 2.  Implantation of a STJ dual chamber PPM on 02/26/15 by Dr Johney FrameAllred.  The patient received a STJ model number Assurity PPM with model number 2088 right atrial lead and 1948 right ventricular lead. There were no immediate post procedure complications. 2.  CXR on 02/27/15 demonstrated no pneumothorax status post device implantation.   Brief HPI/Hospital Course:  [name] All is a 81 y.o. female with a past medical history as outlined above. She presented to [facility] with increased shortness of breath and chest pain and was found to be in complete heart block.  [facility].  There were no reversible causes to heart block identified.  Echocardiogram demonstrated improvement in EF to 55%.  Risks, benefits, and alternatives to PPM implantation were reviewed with the patient who wished to proceed.    The patient underwent implantation of a STJ dual chamber  pacemaker with details as outlined above.  She was monitored on telemetry overnight which demonstrated sinus rhythm with ventricular pacing.  Left chest was without hematoma or ecchymosis.  The device was interrogated and found to be functioning normally.  CXR was obtained and demonstrated no pneumothorax status post device implantation.  Wound care, arm mobility, and restrictions were reviewed with the patient.  The patient was examined and considered stable for discharge to home.    Physical Exam: Filed Vitals:   02/27/15 0024 02/27/15 0515 02/27/15 1114 02/27/15 1500  BP: 135/62 117/60  98/51  Pulse: 98 98  83  Temp:  97.2 F (36.2 C)  97.3 F (36.3 C)  TempSrc:  Axillary  Oral  Resp: 18 18  18   Height:      Weight:  158 lb 4.6 oz (71.8 kg)    SpO2: 98% 100% 91% 95%    Labs:   Lab Results  Component Value Date   WBC 6.9 02/26/2015   HGB 12.3 02/26/2015   HCT 38.1 02/26/2015   MCV 88.4 02/26/2015   PLT 141* 02/26/2015     Recent Labs Lab 02/24/15 1200 02/26/15 1130  NA 135 137  K 4.1 4.2  CL 106 108  CO2 26 24  BUN 20 14  CREATININE 0.98 0.88  CALCIUM 9.0 8.5  PROT 7.0  --   BILITOT 0.3  --   ALKPHOS 78  --   ALT 19  --   AST 22  --   GLUCOSE 247* 235*    Discharge Medications:    Medication List    TAKE these  medications        aspirin 325 MG tablet  Take 325 mg by mouth daily.     ferrous sulfate 325 (65 FE) MG tablet  Take 325 mg by mouth daily with breakfast.     omeprazole 20 MG capsule  Commonly known as:  PRILOSEC  Take 20 mg by mouth daily.     sitaGLIPtin-metformin 50-500 MG per tablet  Commonly known as:  JANUMET  Take 1 tablet by mouth 2 (two) times daily with a meal.        Disposition:   Follow-up Information    Follow up with Marily Lente, NP On 03/10/2015.   Specialty:  Nurse Practitioner   Why:  at 11:30AM with device clinic   Contact information:   [address] [place] [address] [phone]       Follow up  with Lewayne Bunting, MD On 05/30/2015.   Specialty:  Cardiology   Why:  at 8:30AM   Contact information:   [address] [place] [address] [phone]       Duration of Discharge Encounter: Greater than 30 minutes including physician time.  Signed, Gypsy Balsam, NP 02/27/2015 7:56 PM   Hillis Range MD

## 2015-03-01 LAB — STOOL CULTURE

## 2015-03-09 ENCOUNTER — Encounter: Payer: Self-pay | Admitting: Nurse Practitioner

## 2015-03-09 NOTE — Progress Notes (Signed)
Electrophysiology Office Note Date: 03/10/2015  ID:  [name], DOB 08-Aug-1932, MRN [id number]  PCP: Isabella StallingNDIEGO,RICHARD M, MD Primary Cardiologist: Branch Electrophysiologist: Ladona Ridgelaylor (to establish in RDS)  CC: Pacemaker wound check  [name] is a 81 y.o. female is seen today for Dr Johney FrameAllred.  She presents today for post hospital electrophysiology followup.  Since discharge, the patient reports doing very well.  She denies chest pain, palpitations, dyspnea, PND, orthopnea, nausea, vomiting, dizziness, or syncope.  Device History: STJ dual chamber PPM implanted 02/26/15 by Dr Johney FrameAllred for complete heart block   Past Medical History  Diagnosis Date  . Type II or unspecified type diabetes mellitus without mention of complication, not stated as uncontrolled   . Unspecified hemorrhoids without mention of complication   . Depression   . CAD (coronary artery disease)     a. s/p PCI to LAD '08, Severe 3V CAD by cath 03/27/12 --> CABG  . Mitral regurgitation     moderate by cath, mild by echo 03/2012  . Paroxysmal atrial fibrillation     a. identified s/p CABG b. OAC discontinued 02/2014 after no recurrence  . Osteoarthrosis, unspecified whether generalized or localized, unspecified site   . Personal history of other diseases of digestive system   . Unspecified glaucoma   . Hypertension   . Hypothyroidism   . CHF (congestive heart failure)   . Complete heart block     a. s/p STJ dual chamber pacemaker   Past Surgical History  Procedure Laterality Date  . Coronary artery bypass graft  04/03/2012    Procedure: CORONARY ARTERY BYPASS GRAFTING (CABG);  Surgeon: Kerin PernaPeter Van Trigt, MD;  [facility];  Service: Open Heart Surgery;  Laterality: N/A;  CABG x four;  using left internal mammary artery and bilateral greater saphenous veins  . Mitral valve repair  04/03/2012    Procedure: MITRAL VALVE REPAIR (MVR);  Surgeon: Kerin PernaPeter Van Trigt, MD;  [facility];  Service: Open Heart Surgery;   Laterality: N/A;  . Ercp  2008  . Cholecystectomy  12/2006  . Left heart catheterization with coronary angiogram N/A 03/27/2012    Procedure: LEFT HEART CATHETERIZATION WITH CORONARY ANGIOGRAM;  Surgeon: Herby Abrahamhomas D Stuckey, MD;  [facility];  Service: Cardiovascular;  Laterality: N/A;  . Permanent pacemaker insertion N/A 02/26/2015    STJ Assurity dual chamber pacemaker implanted by Dr Johney FrameAllred for symptomatic complete heart block    Allergies:   Lisinopril; Quinine; Penicillins; and Sulfonamide derivatives   Social History: History   Social History  . Marital Status: Married    Spouse Name: N/A  . Number of Children: N/A  . Years of Education: N/A   Occupational History  . Not on file.   Social History Main Topics  . Smoking status: Former Smoker    Types: Cigarettes    Quit date: 09/03/2004  . Smokeless tobacco: Never Used  . Alcohol Use: No  . Drug Use: No  . Sexual Activity: Not on file   Other Topics Concern  . Not on file   Social History Narrative    Family History: Family History  Problem Relation Age of Onset  . Heart disease Mother   . Heart disease Father [id number]    CABG  . Arrhythmia Brother     slow HR off and on since his 1320s     Review of Systems: General: No chills, fever, night sweats or weight changes  Cardiovascular:  No chest pain, dyspnea  on exertion, edema, orthopnea, palpitations, paroxysmal nocturnal dyspnea  Dermatological: No rash, lesions or masses Respiratory: No cough, dyspnea Urologic: No hematuria, dysuria Abdominal: No nausea, vomiting, diarrhea, bright red blood per rectum, melena, or hematemesis Neurologic: No visual changes, weakness, changes in mental status All other systems reviewed and are otherwise negative except as noted above.   Physical Exam: VS:  BP 142/84 mmHg  Pulse 88  Ht  (1.727 m)  Wt 154 lb 12.8 oz (70.217 kg)  BMI 23.54 kg/m2 , BMI Body mass index is 23.54 kg/(m^2).  GEN- The patient is well  appearing, alert and oriented x 3 today.   HEENT: normocephalic, atraumatic; sclera clear, conjunctiva pink; hearing intact; oropharynx clear; neck supple, no JVP Lymph- no cervical lymphadenopathy Lungs- Clear to ausculation bilaterally, normal work of breathing.  No wheezes, rales, rhonchi Heart- Regular rate and rhythm (paced) GI- soft, non-tender, non-distended, bowel sounds present Extremities- no clubbing, cyanosis, or edema; DP/PT/radial pulses 1+ bilaterally MS- no significant deformity or atrophy Skin- warm and dry, no rash or lesion; PPM pocket well healed Psych- euthymic mood, full affect Neuro- strength and sensation are intact  PPM Interrogation- reviewed in detail today,  See PACEART report  EKG:  EKG is not ordered today.  Recent Labs: 02/24/2015: ALT 19; B Natriuretic Peptide 423.0*; TSH 4.550* 02/26/2015: BUN 14; Creatinine 0.88; Hemoglobin 12.3; Platelets 141*; Potassium 4.2; Sodium 137   Wt Readings from Last 3 Encounters:  03/10/15 154 lb 12.8 oz (70.217 kg)  02/27/15 158 lb 4.6 oz (71.8 kg)  09/03/14 136 lb (61.689 kg)     Other studies Reviewed: Additional studies/ records that were reviewed today include: hospital records   Assessment and Plan:  1.  Complete heart block Normal PPM function See Pace Art report No changes today  2.  CAD No recent ischemic symptoms Resume Coreg 6.25mg  twice daily today  Decrease ASA to  today  3.  Paroxysmal atrial fibrillation Post-op CABG AF, she wore a 48 hour holter monitor 02/2014 with Dr Wyline Mood and no AF was identified, warfarin was discontinued at that time.  No AF by device interrogation today Will monitor AF burden remotely  CHADS2VASC score is at least 6  4.  HTN BP elevated today off of Coreg Resume Coreg today as above   Current medicines are reviewed at length with the patient today.   The patient does not have concerns regarding her medicines.  The following changes were made today:  Resume Coreg  6.25mg  twice daily, decrease ASA to  daily today.   Labs/ tests ordered today include: none   Disposition:   FU with Dr Ladona Ridgel 3 months in [facility] office. Follow up with Dr Wyline Mood in 6 weeks.    Signed, Gypsy Balsam, NP 03/10/2015 12:06 PM  [facility] [address] [place] [address] [phone] (office) [phone] (fax

## 2015-03-10 ENCOUNTER — Ambulatory Visit: Payer: Medicare HMO

## 2015-03-10 ENCOUNTER — Encounter: Payer: Self-pay | Admitting: Nurse Practitioner

## 2015-03-10 ENCOUNTER — Ambulatory Visit (INDEPENDENT_AMBULATORY_CARE_PROVIDER_SITE_OTHER): Payer: Medicare HMO | Admitting: Nurse Practitioner

## 2015-03-10 ENCOUNTER — Other Ambulatory Visit: Payer: Self-pay | Admitting: Internal Medicine

## 2015-03-10 VITALS — BP 142/84 | HR 88 | Ht 68.0 in | Wt 154.8 lb

## 2015-03-10 DIAGNOSIS — I25810 Atherosclerosis of coronary artery bypass graft(s) without angina pectoris: Secondary | ICD-10-CM

## 2015-03-10 DIAGNOSIS — I442 Atrioventricular block, complete: Secondary | ICD-10-CM

## 2015-03-10 DIAGNOSIS — I480 Paroxysmal atrial fibrillation: Secondary | ICD-10-CM

## 2015-03-10 LAB — MDC_IDC_ENUM_SESS_TYPE_INCLINIC
Battery Voltage: 3.07 V
Brady Statistic RA Percent Paced: 2.4 %
Brady Statistic RV Percent Paced: 12 %
Implantable Pulse Generator Model: 2240
Implantable Pulse Generator Serial Number: 7730931
Lead Channel Impedance Value: 525 Ohm
Lead Channel Impedance Value: 700 Ohm
Lead Channel Pacing Threshold Pulse Width: 0.4 ms
Lead Channel Sensing Intrinsic Amplitude: 3.8 mV
Lead Channel Setting Pacing Amplitude: 0.875
Lead Channel Setting Pacing Amplitude: 3.5 V
Lead Channel Setting Sensing Sensitivity: 4 mV
MDC IDC MSMT BATTERY REMAINING LONGEVITY: 138 mo
MDC IDC MSMT LEADCHNL RA PACING THRESHOLD AMPLITUDE: 1.25 V
MDC IDC MSMT LEADCHNL RA PACING THRESHOLD PULSEWIDTH: 0.4 ms
MDC IDC MSMT LEADCHNL RV PACING THRESHOLD AMPLITUDE: 0.75 V
MDC IDC MSMT LEADCHNL RV SENSING INTR AMPL: 12 mV
MDC IDC SESS DTM: 20160314120752
MDC IDC SET LEADCHNL RV PACING PULSEWIDTH: 0.4 ms

## 2015-03-10 MED ORDER — ASPIRIN 81 MG PO TABS: 81.0000 mg | ORAL_TABLET | Freq: Every day | ORAL | Status: DC

## 2015-03-10 MED ORDER — CARVEDILOL 6.25 MG PO TABS: 6.2500 mg | ORAL_TABLET | Freq: Two times a day (BID) | ORAL | Status: DC

## 2015-03-10 NOTE — Patient Instructions (Signed)
Your physician has recommended you make the following change in your medication:  1) REDUCE Aspirin to 81mg  daily 2) RESUME Coreg 6.25mg  twice daily. An Rx has been sent to your pharmacy  Your physician recommends that you schedule a follow-up appointment in: 6 weeks with Dr.Branch   Follow up with Dr.Allred as planned

## 2015-03-12 ENCOUNTER — Telehealth: Payer: Self-pay | Admitting: *Deleted

## 2015-03-12 NOTE — Telephone Encounter (Signed)
Called patient to confirm that manual transmission 03/11/15 was received.

## 2015-03-14 ENCOUNTER — Ambulatory Visit: Payer: Medicare Other | Admitting: Cardiology

## 2015-04-01 ENCOUNTER — Encounter: Payer: Self-pay | Admitting: Internal Medicine

## 2015-05-05 ENCOUNTER — Ambulatory Visit (INDEPENDENT_AMBULATORY_CARE_PROVIDER_SITE_OTHER): Payer: Medicare HMO | Admitting: Cardiology

## 2015-05-05 ENCOUNTER — Encounter: Payer: Self-pay | Admitting: Cardiology

## 2015-05-05 VITALS — BP 160/68 | HR 94 | Ht 68.0 in | Wt 160.0 lb

## 2015-05-05 DIAGNOSIS — Z9889 Other specified postprocedural states: Secondary | ICD-10-CM

## 2015-05-05 DIAGNOSIS — I2510 Atherosclerotic heart disease of native coronary artery without angina pectoris: Secondary | ICD-10-CM

## 2015-05-05 DIAGNOSIS — E785 Hyperlipidemia, unspecified: Secondary | ICD-10-CM

## 2015-05-05 DIAGNOSIS — I5022 Chronic systolic (congestive) heart failure: Secondary | ICD-10-CM | POA: Diagnosis not present

## 2015-05-05 DIAGNOSIS — I442 Atrioventricular block, complete: Secondary | ICD-10-CM

## 2015-05-05 NOTE — Progress Notes (Signed)
Clinical Summary [name] is a 81 y.o.female seen today for follow up of the following medical problems.   1. CAD/ICM/Chronic combined systolic and diastolic heart failure - prior PCI to LAD in 2008, CABG in 2013  - LVEF 35% by echo 03/2012, repeat echo 02/2014 LVEF 45-50% with grade II diastolic dysfunction - not on lisinopril because of history of angioedema  - significant dizziness and fatigue on high coreg doses, tolerating 6.25mg  bid well  - denies any SOB, no DOE. No chest pain.  - compliant with meds. Limiting sodium intake  2. Mitral regurgitation  - s/p mitral valve With 26 mm physio II mitral ring. No significant regurg on most recent echo  - denies any LE edema, no orthopnea.    3. Post operative A.fib  - from chart review her afib appears to have been postcardiac surgery - monitor 02/2014 without any episodes - had been on coumadin, recently noted to have severe anemia with Hgb around 5, coumadin was stopped and started on ASA - denies any palpitations  4. Hyperlipidemia  - reports she stopped pravastatin because of muscle aches, reports she stopped approx 1 year ago  - reports muscle aches on lipitor previously as well  09/2014: TC 163 HDL 29 LDL 96 TG 191  5. Anemia - followed by pcp - she is off coumadin, now only ASA.   6. Bradycardia - admitted 02/2015 with complete heart block -St Jude dual chamber PPM placed 02/2015  - no recent symptoms  7. HTN - checks at home daily, typically <140s/70  Past Medical History  Diagnosis Date  . Type II or unspecified type diabetes mellitus without mention of complication, not stated as uncontrolled   . Unspecified hemorrhoids without mention of complication   . Depression   . CAD (coronary artery disease)     a. s/p PCI to LAD '08, Severe 3V CAD by cath 03/27/12 --> CABG  . Mitral regurgitation     moderate by cath, mild by echo 03/2012  . Paroxysmal atrial fibrillation     a. identified s/p CABG b. OAC  discontinued 02/2014 after no recurrence  . Osteoarthrosis, unspecified whether generalized or localized, unspecified site   . Personal history of other diseases of digestive system   . Unspecified glaucoma   . Hypertension   . Hypothyroidism   . CHF (congestive heart failure)   . Complete heart block     a. s/p STJ dual chamber pacemaker     Allergies  Allergen Reactions  . Lisinopril Anaphylaxis  . Quinine Other (See Comments)    Pt states heart attack-like symptoms  . Penicillins Swelling and Other (See Comments)    Alison states that she had swelling at the injection site after her last Penicillin shot but no swelling in the throat.  . Sulfonamide Derivatives Rash     Current Outpatient Prescriptions  Medication Sig Dispense Refill  . aspirin 81 MG tablet Take 1 tablet (81 mg total) by mouth daily.    . carvedilol (COREG) 6.25 MG tablet Take 1 tablet (6.25 mg total) by mouth 2 (two) times daily. 60 tablet 5  . ferrous sulfate 325 (65 FE) MG tablet Take 325 mg by mouth daily with breakfast.    . omeprazole (PRILOSEC) 20 MG capsule Take 20 mg by mouth daily.    . sitaGLIPtan-metformin (JANUMET) 50-500 MG per tablet Take 1 tablet by mouth 2 (two) times daily with a meal.     No current facility-administered  medications for this visit.     Past Surgical History  Procedure Laterality Date  . Coronary artery bypass graft  04/03/2012    Procedure: CORONARY ARTERY BYPASS GRAFTING (CABG);  Surgeon: Kerin Perna, MD;  Location: [facility] OR;  Service: Open Heart Surgery;  Laterality: N/A;  CABG x four;  using left internal mammary artery and bilateral greater saphenous veins  . Mitral valve repair  04/03/2012    Procedure: MITRAL VALVE REPAIR (MVR);  Surgeon: Kerin Perna, MD;  Location: [facility] OR;  Service: Open Heart Surgery;  Laterality: N/A;  . Ercp  2008  . Cholecystectomy  12/2006  . Left heart catheterization with coronary angiogram N/A 03/27/2012    Procedure: LEFT HEART  CATHETERIZATION WITH CORONARY ANGIOGRAM;  Surgeon: Herby Abraham, MD;  Location: [facility] CATH LAB;  Service: Cardiovascular;  Laterality: N/A;  . Permanent pacemaker insertion N/A 02/26/2015    STJ Assurity dual chamber pacemaker implanted by Dr Johney Frame for symptomatic complete heart block     Allergies  Allergen Reactions  . Lisinopril Anaphylaxis  . Quinine Other (See Comments)    Pt states heart attack-like symptoms  . Penicillins Swelling and Other (See Comments)    Alison states that she had swelling at the injection site after her last Penicillin shot but no swelling in the throat.  . Sulfonamide Derivatives Rash      Family History  Problem Relation Age of Onset  . Heart disease Mother   . Heart disease Father 34    CABG  . Arrhythmia Brother     slow HR off and on since his 21s     Social History [name] reports that she quit smoking about 10 years ago. Her smoking use included Cigarettes. She has never used smokeless tobacco. [name] reports that she does not drink alcohol.   Review of Systems CONSTITUTIONAL: No weight loss, fever, chills, weakness or fatigue.  HEENT: Eyes: No visual loss, blurred vision, double vision or yellow sclerae.No hearing loss, sneezing, congestion, runny nose or sore throat.  SKIN: No rash or itching.  CARDIOVASCULAR: per HPI RESPIRATORY: No shortness of breath, cough or sputum.  GASTROINTESTINAL: No anorexia, nausea, vomiting or diarrhea. No abdominal pain or blood.  GENITOURINARY: No burning on urination, no polyuria NEUROLOGICAL: No headache, dizziness, syncope, paralysis, ataxia, numbness or tingling in the extremities. No change in bowel or bladder control.  MUSCULOSKELETAL: No muscle, back pain, joint pain or stiffness.  LYMPHATICS: No enlarged nodes. No history of splenectomy.  PSYCHIATRIC: No history of depression or anxiety.  ENDOCRINOLOGIC: No reports of sweating, cold or heat intolerance. No polyuria or polydipsia.   [name]   Physical Examination p 94 bp 160/68 Wt 160 lbs BMI 24 Gen: resting comfortably, no acute distress HEENT: no scleral icterus, pupils equal round and reactive, no palptable cervical adenopathy,  CV: RRR, no m/r/g, no JVD, no carotid bruits Resp: Clear to auscultation bilaterally GI: abdomen is soft, non-tender, non-distended, normal bowel sounds, no hepatosplenomegaly MSK: extremities are warm, no edema.  Skin: warm, no rash Neuro:  no focal deficits Psych: appropriate affect   Diagnostic Studies  02/2014 Echo Study Conclusions  - Left ventricle: The cavity size was normal. Wall thickness was increased in a pattern of mild LVH. Systolic function was mildly reduced. The estimated ejection fraction was in the range of 45% to 50%. There is hypokinesis of the distalanteroseptal myocardium. There is akinesis of the basalinferior myocardium. Features are consistent with a pseudonormal left ventricular filling pattern,  with concomitant abnormal relaxation and increased filling pressure (grade 2 diastolic dysfunction). - Aortic valve: Mildly calcified annulus. Trileaflet. Trivial regurgitation. - Mitral valve: Mildly thickened leaflets . A 25mmannular ring prosthesis was present. No significant regurgitation. Mean gradient: 11mm Hg (D). Valve area by pressure half-time: 4.68cm^2. Valve area by continuity equation (using LVOT flow): 1.31cm^2. - Left atrium: The atrium was mildly dilated. - Right ventricle: The cavity size was mildly dilated. - Right atrium: The atrium was mildly dilated. Central venous pressure: 3mm Hg (est). - Tricuspid valve: Mild regurgitation. - Pulmonary arteries: PA peak pressure: 42mm Hg (S). - Pericardium, extracardiac: There was no pericardial effusion. Impressions:  - Mild LVH with LVEF 45-50%, wall motion abnormalities as noted above, grade 2 diastolic dysfunction. Compared to study 2013, there has been improvement in LVEF and rhythm has gone  from atrial fibrillation to sinus. Mild biatrial enlargement. Mitral annular ring with no significant mtiral regurgitation. Somewhat discordant information regarding possible stenotic flow as noted above. Mild RV enlargement with mild tricuspid regurgitation and PASP 42 mmHg.  02/2015 echo Study Conclusions  - Left ventricle: Hypokinesis of apical inferior segment and apical septal segment. The cavity size was normal. Wall thickness was increased in a pattern of mild LVH. The estimated ejection fraction was 55%. - Aortic valve: Sclerosis without stenosis. There was mild regurgitation. - Mitral valve: By history there is a mitral valve repair. The ring is seen. There is mild MR. Assessment is difficult, but I doubt any significant mitral stenosis. - Right ventricle: The cavity size was normal. Systolic function was normal. - Pulmonary arteries: PA peak pressure: 40 mm Hg (S).  Assessment and Plan   1.CAD/ICM/Chronic systolic heart failure - LVEF 16%35% 03/2012, repeat echo 02/2014 improved to 45-50% - she appears euvolemic today, no significant symptoms - not on ACE due to angioedema, no ARB due to reported crossreactivity with angioedema. Higher doses of coreg caused side effects - continue current meds  2. Postoperative Afib  - from notes this occurred only in the postop period, no prior history. She had been maintained on coumain since that time  - 48 hr holter without evidence of afib - now off coumadin due to severe anemia, from afib standpoint no indication to consider restarting   3. Mitral regurgitation  - with prior repair, no current symptoms  - continue to follow clinically   4. Hyperlipidemia  - not able to tolerate statins due to side effects - request most recent lipid panel, she may be a candidate for zetia or pcsk9 inhibitors  F/u 6 months   Antoine PocheJonathan F. Branch, M.D.

## 2015-05-05 NOTE — Patient Instructions (Signed)
Your physician wants you to follow-up in: 6 months with Dr. Branch (November 2016) You will receive a reminder letter in the mail two months in advance. If you don't receive a letter, please call our office to schedule the follow-up appointment.   Your physician recommends that you continue on your current medications as directed. Please refer to the Current Medication list given to you today.     Thank you for choosing [facility] !         

## 2015-05-30 ENCOUNTER — Ambulatory Visit (INDEPENDENT_AMBULATORY_CARE_PROVIDER_SITE_OTHER): Payer: Medicare HMO | Admitting: Internal Medicine

## 2015-05-30 ENCOUNTER — Encounter: Payer: Self-pay | Admitting: Internal Medicine

## 2015-05-30 VITALS — BP 142/70 | HR 80 | Ht 68.0 in | Wt 159.0 lb

## 2015-05-30 DIAGNOSIS — I480 Paroxysmal atrial fibrillation: Secondary | ICD-10-CM | POA: Diagnosis not present

## 2015-05-30 DIAGNOSIS — I442 Atrioventricular block, complete: Secondary | ICD-10-CM | POA: Diagnosis not present

## 2015-05-30 DIAGNOSIS — I10 Essential (primary) hypertension: Secondary | ICD-10-CM

## 2015-05-30 DIAGNOSIS — Z950 Presence of cardiac pacemaker: Secondary | ICD-10-CM | POA: Diagnosis not present

## 2015-05-30 LAB — CUP PACEART INCLINIC DEVICE CHECK
Battery Voltage: 3.01 V
Brady Statistic RV Percent Paced: 51 %
Lead Channel Pacing Threshold Amplitude: 0.875 V
Lead Channel Sensing Intrinsic Amplitude: 3.6 mV
Lead Channel Sensing Intrinsic Amplitude: 8.7 mV
Lead Channel Setting Pacing Amplitude: 1.125
Lead Channel Setting Pacing Pulse Width: 0.4 ms
MDC IDC MSMT BATTERY REMAINING LONGEVITY: 130.8 mo
MDC IDC MSMT LEADCHNL RA IMPEDANCE VALUE: 450 Ohm
MDC IDC MSMT LEADCHNL RA PACING THRESHOLD AMPLITUDE: 0.75 V
MDC IDC MSMT LEADCHNL RA PACING THRESHOLD PULSEWIDTH: 0.4 ms
MDC IDC MSMT LEADCHNL RV IMPEDANCE VALUE: 662.5 Ohm
MDC IDC MSMT LEADCHNL RV PACING THRESHOLD PULSEWIDTH: 0.4 ms
MDC IDC SESS DTM: 20160603085644
MDC IDC SET LEADCHNL RA PACING AMPLITUDE: 1.75 V
MDC IDC SET LEADCHNL RV SENSING SENSITIVITY: 4 mV
MDC IDC STAT BRADY RA PERCENT PACED: 1.4 %
Pulse Gen Serial Number: 7730931

## 2015-05-30 NOTE — Assessment & Plan Note (Signed)
Her St. Jude DDD PM is working normally. Will recheck in several months.  

## 2015-05-30 NOTE — Assessment & Plan Note (Signed)
She denies sodium indiscretion. She will continue her current meds. I considered uptitration of her beta blocker but will hold off for now.

## 2015-05-30 NOTE — Progress Notes (Signed)
HPI [name] returns today for followup. She is a pleasant 81 yo woman with a h/o sinus node dysfunction and high grade heart block, s/p PPM insertion. She also has HTN. In the interim she has done well with no chest pain or sob. No edema. She remains active cooking, cleaning and tending to her flowers. No syncope.   Allergies  Allergen Reactions  . Lisinopril Anaphylaxis  . Quinine Other (See Comments)    Pt states heart attack-like symptoms  . Penicillins Swelling and Other (See Comments)    Patient states that she had swelling at the injection site after her last Penicillin shot but no swelling in the throat.  . Sulfonamide Derivatives Rash     Current Outpatient Prescriptions  Medication Sig Dispense Refill  . aspirin 81 MG tablet Take 1 tablet (81 mg total) by mouth daily.    . carvedilol (COREG) 6.25 MG tablet Take 1 tablet (6.25 mg total) by mouth 2 (two) times daily. 60 tablet 5  . cetirizine (ZYRTEC) 10 MG tablet Take 10 mg by mouth daily.    . ferrous sulfate 325 (65 FE) MG tablet Take 325 mg by mouth daily with breakfast.    . omeprazole (PRILOSEC) 20 MG capsule Take 20 mg by mouth daily.    . sitaGLIPtan-metformin (JANUMET) 50-500 MG per tablet Take 1 tablet by mouth 2 (two) times daily with a meal.    . amLODipine (NORVASC) 5 MG tablet Take 5 mg by mouth daily.  5   No current facility-administered medications for this visit.     Past Medical History  Diagnosis Date  . Type II or unspecified type diabetes mellitus without mention of complication, not stated as uncontrolled   . Unspecified hemorrhoids without mention of complication   . Depression   . CAD (coronary artery disease)     a. s/p PCI to LAD '08, Severe 3V CAD by cath 03/27/12 --> CABG  . Mitral regurgitation     moderate by cath, mild by echo 03/2012  . Paroxysmal atrial fibrillation     a. identified s/p CABG b. OAC discontinued 02/2014 after no recurrence  . Osteoarthrosis, unspecified whether  generalized or localized, unspecified site   . Personal history of other diseases of digestive system   . Unspecified glaucoma   . Hypertension   . Hypothyroidism   . CHF (congestive heart failure)   . Complete heart block     a. s/p STJ dual chamber pacemaker    ROS:   All systems reviewed and negative except as noted in the HPI.   Past Surgical History  Procedure Laterality Date  . Coronary artery bypass graft  04/03/2012    Procedure: CORONARY ARTERY BYPASS GRAFTING (CABG);  Surgeon: Kerin Perna, MD;  Location: [facility] OR;  Service: Open Heart Surgery;  Laterality: N/A;  CABG x four;  using left internal mammary artery and bilateral greater saphenous veins  . Mitral valve repair  04/03/2012    Procedure: MITRAL VALVE REPAIR (MVR);  Surgeon: Kerin Perna, MD;  Location: [facility] OR;  Service: Open Heart Surgery;  Laterality: N/A;  . Ercp  2008  . Cholecystectomy  12/2006  . Left heart catheterization with coronary angiogram N/A 03/27/2012    Procedure: LEFT HEART CATHETERIZATION WITH CORONARY ANGIOGRAM;  Surgeon: Herby Abraham, MD;  Location: [facility] C[facility] LAB;  Service: Cardiovascular;  Laterality: N/A;  . Permanent pacemaker insertion N/A 02/26/2015    STJ Assurity dual chamber pacemaker implanted  by Dr Johney FrameAllred for symptomatic complete heart block     Family History  Problem Relation Age of Onset  . Heart disease Mother   . Heart disease Father [id number]    CABG  . Arrhythmia Brother     slow HR off and on since his 8020s     History   Social History  . Marital Status: Married    Spouse Name: N/A  . Number of Children: N/A  . Years of Education: N/A   Occupational History  . Not on file.   Social History Main Topics  . Smoking status: Former Smoker    Types: Cigarettes    Quit date: 09/03/2004  . Smokeless tobacco: Never Used  . Alcohol Use: No  . Drug Use: No  . Sexual Activity: Not on file   Other Topics Concern  . Not on file   Social History Narrative     BP 142/70  mmHg  Pulse 80  Ht 5\' 8"  (1.727 m)  Wt 159 lb (72.122 kg)  BMI 24.18 kg/m2  SpO2 97%  Physical Exam:  Well appearing 81 yo woman, NAD HEENT: Unremarkable Neck:  6 cm JVD, no thyromegally Back:  No CVA tenderness Lungs:  Clear with no wheezes. Well healed PPM incision. HEART:  Regular rate rhythm, no murmurs, no rubs, no clicks Abd:  soft, positive bowel sounds, no organomegally, no rebound, no guarding Ext:  2 plus pulses, no edema, no cyanosis, no clubbing Skin:  No rashes no nodules Neuro:  CN II through XII intact, motor grossly intact   DEVICE  Normal device function.  See PaceArt for details.   Assess/Plan:

## 2015-05-30 NOTE — Assessment & Plan Note (Signed)
She is maintaining NSR 99.99% of the time. She has a h/o bleeding on coumadin so will continue her current meds and ASA.

## 2015-05-30 NOTE — Patient Instructions (Addendum)
Your physician wants you to follow-up in: 9 months with Dr. Ladona Ridgelaylor. You will receive a reminder letter in the mail two months in advance. If you don't receive a letter, please call our office to schedule the follow-up appointment.  Remote monitoring is used to monitor your Pacemaker of ICD from home. This monitoring reduces the number of office visits required to check your device to one time per year. It allows us to keep an eye on the functioning of your device to ensure it is working properly. You are scheduled for a device check from home on 09/02/15. You may send your transmission at any time that day. If you have a wireless device, the transmission will be sent automatically. After your physician reviews your transmission, you will receive a postcard with your next transmission date.  Your physician recommends that you continue on your current medications as directed. Please refer to the Current Medication list given to you today.  Thank you for choosing [facility]!

## 2015-06-06 ENCOUNTER — Encounter: Payer: Self-pay | Admitting: Internal Medicine

## 2015-07-01 ENCOUNTER — Encounter: Payer: Self-pay | Admitting: Internal Medicine

## 2015-07-09 ENCOUNTER — Telehealth: Payer: Self-pay

## 2015-07-09 MED ORDER — CARVEDILOL 6.25 MG PO TABS: 6.2500 mg | ORAL_TABLET | Freq: Two times a day (BID) | ORAL | Status: DC

## 2015-07-09 NOTE — Telephone Encounter (Signed)
Refill complete 

## 2015-09-02 ENCOUNTER — Ambulatory Visit: Payer: Medicare HMO | Admitting: *Deleted

## 2015-09-05 NOTE — Progress Notes (Deleted)
Remote pacemaker transmission.   

## 2015-09-22 ENCOUNTER — Encounter: Payer: Self-pay | Admitting: Cardiology

## 2015-09-26 ENCOUNTER — Ambulatory Visit (INDEPENDENT_AMBULATORY_CARE_PROVIDER_SITE_OTHER): Payer: Medicare HMO | Admitting: *Deleted

## 2015-09-26 DIAGNOSIS — I442 Atrioventricular block, complete: Secondary | ICD-10-CM

## 2015-09-26 LAB — CUP PACEART REMOTE DEVICE CHECK
Battery Remaining Percentage: 95.5 %
Battery Voltage: 3.01 V
Brady Statistic AP VP Percent: 2 %
Brady Statistic AS VP Percent: 37 %
Brady Statistic AS VS Percent: 59 %
Brady Statistic RA Percent Paced: 2.3 %
Lead Channel Impedance Value: 430 Ohm
Lead Channel Impedance Value: 650 Ohm
Lead Channel Pacing Threshold Amplitude: 0.75 V
Lead Channel Pacing Threshold Pulse Width: 0.4 ms
Lead Channel Pacing Threshold Pulse Width: 0.4 ms
Lead Channel Sensing Intrinsic Amplitude: 3.3 mV
Lead Channel Sensing Intrinsic Amplitude: 5.8 mV
Lead Channel Setting Pacing Amplitude: 1 V
Lead Channel Setting Pacing Amplitude: 1.75 V
Lead Channel Setting Sensing Sensitivity: 4 mV
MDC IDC MSMT BATTERY REMAINING LONGEVITY: 127 mo
MDC IDC MSMT LEADCHNL RV PACING THRESHOLD AMPLITUDE: 0.75 V
MDC IDC PG SERIAL: 7730931
MDC IDC SESS DTM: 20160930142914
MDC IDC SET LEADCHNL RV PACING PULSEWIDTH: 0.4 ms
MDC IDC STAT BRADY AP VS PERCENT: 1 %
MDC IDC STAT BRADY RV PERCENT PACED: 39 %
Pulse Gen Model: 2240

## 2015-09-30 NOTE — Progress Notes (Signed)
Remote pacemaker transmission.   

## 2015-10-13 ENCOUNTER — Encounter: Payer: Self-pay | Admitting: *Deleted

## 2015-12-26 ENCOUNTER — Ambulatory Visit (INDEPENDENT_AMBULATORY_CARE_PROVIDER_SITE_OTHER): Payer: Medicare HMO | Admitting: *Deleted

## 2015-12-26 DIAGNOSIS — I442 Atrioventricular block, complete: Secondary | ICD-10-CM | POA: Diagnosis not present

## 2015-12-26 NOTE — Progress Notes (Signed)
Remote pacemaker transmission.   

## 2016-01-05 ENCOUNTER — Other Ambulatory Visit: Payer: Self-pay

## 2016-01-05 MED ORDER — CARVEDILOL 6.25 MG PO TABS: 6.2500 mg | ORAL_TABLET | Freq: Two times a day (BID) | ORAL | Status: DC

## 2016-01-13 LAB — CUP PACEART REMOTE DEVICE CHECK
Battery Remaining Longevity: 95 mo
Brady Statistic AP VS Percent: 2.5 %
Brady Statistic AS VP Percent: 26 %
Brady Statistic AS VS Percent: 69 %
Date Time Interrogation Session: 20161230124147
Implantable Lead Implant Date: 20160302
Implantable Lead Implant Date: 20160302
Implantable Lead Location: 753859
Implantable Lead Location: 753860
Lead Channel Pacing Threshold Amplitude: 0.875 V
Lead Channel Pacing Threshold Pulse Width: 0.4 ms
Lead Channel Sensing Intrinsic Amplitude: 12 mV
Lead Channel Setting Pacing Amplitude: 1.125
Lead Channel Setting Pacing Amplitude: 5 V
Lead Channel Setting Pacing Pulse Width: 0.4 ms
Lead Channel Setting Sensing Sensitivity: 4 mV
MDC IDC LEAD MODEL: 1948
MDC IDC MSMT BATTERY REMAINING PERCENTAGE: 95.5 %
MDC IDC MSMT BATTERY VOLTAGE: 3.01 V
MDC IDC MSMT LEADCHNL RA IMPEDANCE VALUE: 400 Ohm
MDC IDC MSMT LEADCHNL RA SENSING INTR AMPL: 2.6 mV
MDC IDC MSMT LEADCHNL RV IMPEDANCE VALUE: 690 Ohm
MDC IDC STAT BRADY AP VP PERCENT: 1.4 %
MDC IDC STAT BRADY RA PERCENT PACED: 2.7 %
MDC IDC STAT BRADY RV PERCENT PACED: 28 %
Pulse Gen Serial Number: 7730931

## 2016-01-14 ENCOUNTER — Encounter: Payer: Self-pay | Admitting: Cardiology

## 2016-03-05 ENCOUNTER — Encounter: Payer: Self-pay | Admitting: Internal Medicine

## 2016-03-05 ENCOUNTER — Ambulatory Visit (INDEPENDENT_AMBULATORY_CARE_PROVIDER_SITE_OTHER): Payer: Medicare HMO | Admitting: Internal Medicine

## 2016-03-05 VITALS — BP 112/62 | HR 84 | Ht 68.0 in | Wt 151.0 lb

## 2016-03-05 DIAGNOSIS — I495 Sick sinus syndrome: Secondary | ICD-10-CM | POA: Diagnosis not present

## 2016-03-05 NOTE — Progress Notes (Signed)
HPI [name] returns today for followup. She is a pleasant 82 yo woman with a h/o sinus node dysfunction and high grade heart block, s/p PPM insertion. She also has HTN. In the interim she has done well with no chest pain or sob. No edema. She remains active cooking, cleaning and tending to her flowers. No syncope. Her only complaint is related to her eyes and treatment of glaucoma.   Allergies  Allergen Reactions  . Lisinopril Anaphylaxis  . Quinine Other (See Comments)    Pt states heart attack-like symptoms  . Penicillins Swelling and Other (See Comments)    Patient states that she had swelling at the injection site after her last Penicillin shot but no swelling in the throat.  . Sulfonamide Derivatives Rash     Current Outpatient Prescriptions  Medication Sig Dispense Refill  . amLODipine (NORVASC) 5 MG tablet Take 5 mg by mouth daily.  5  . aspirin 81 MG tablet Take 1 tablet (81 mg total) by mouth daily.    . brimonidine (ALPHAGAN) 0.15 % ophthalmic solution USE 1 DROP INTO LEFT EYE TWICE A DAY  2  . carvedilol (COREG) 6.25 MG tablet Take 1 tablet (6.25 mg total) by mouth 2 (two) times daily. 60 tablet 5  . cetirizine (ZYRTEC) 10 MG tablet Take 10 mg by mouth daily.    . INVOKANA 300 MG TABS tablet Take 300 mg by mouth daily.  5  . latanoprost (XALATAN) 0.005 % ophthalmic solution INSTILL 1 DROP IN LEFT EYE NIGHTLY  6  . metFORMIN (GLUCOPHAGE) 500 MG tablet Take 500 mg by mouth 2 (two) times daily.  3  . omeprazole (PRILOSEC) 20 MG capsule      No current facility-administered medications for this visit.     Past Medical History  Diagnosis Date  . Type II or unspecified type diabetes mellitus without mention of complication, not stated as uncontrolled   . Unspecified hemorrhoids without mention of complication   . Depression   . CAD (coronary artery disease)     a. s/p PCI to LAD '08, Severe 3V CAD by cath 03/27/12 --> CABG  . Mitral regurgitation     moderate by  cath, mild by echo 03/2012  . Paroxysmal atrial fibrillation (HCC)     a. identified s/p CABG b. OAC discontinued 02/2014 after no recurrence  . Osteoarthrosis, unspecified whether generalized or localized, unspecified site   . Personal history of other diseases of digestive system   . Unspecified glaucoma   . Hypertension   . Hypothyroidism   . CHF (congestive heart failure) (HCC)   . Complete heart block (HCC)     a. s/p STJ dual chamber pacemaker    ROS:   All systems reviewed and negative except as noted in the HPI.   Past Surgical History  Procedure Laterality Date  . Coronary artery bypass graft  04/03/2012    Procedure: CORONARY ARTERY BYPASS GRAFTING (CABG);  Surgeon: Kerin PernaPeter Van Trigt, MD;  [facility];  Service: Open Heart Surgery;  Laterality: N/A;  CABG x four;  using left internal mammary artery and bilateral greater saphenous veins  . Mitral valve repair  04/03/2012    Procedure: MITRAL VALVE REPAIR (MVR);  Surgeon: Kerin PernaPeter Van Trigt, MD;  [facility];  Service: Open Heart Surgery;  Laterality: N/A;  . Ercp  2008  . Cholecystectomy  12/2006  . Left heart catheterization with coronary angiogram N/A 03/27/2012    Procedure: LEFT  HEART CATHETERIZATION WITH CORONARY ANGIOGRAM;  Surgeon: Herby Abraham, MD;  Location: [facility] CATH LAB;  Service: Cardiovascular;  Laterality: N/A;  . Permanent pacemaker insertion N/A 02/26/2015    STJ Assurity dual chamber pacemaker implanted by Dr Johney Frame for symptomatic complete heart block     Family History  Problem Relation Age of Onset  . Heart disease Mother   . Heart disease Father 22    CABG  . Arrhythmia Brother     slow HR off and on since his 54s     Social History   Social History  . Marital Status: Married    Spouse Name: N/A  . Number of Children: N/A  . Years of Education: N/A   Occupational History  . Not on file.   Social History Main Topics  . Smoking status: Former Smoker    Types: Cigarettes    Quit date:  09/03/2004  . Smokeless tobacco: Never Used  . Alcohol Use: No  . Drug Use: No  . Sexual Activity: Not on file   Other Topics Concern  . Not on file   Social History Narrative     BP 112/62 mmHg  Pulse 84  Ht  (1.727 m)  Wt 151 lb (68.493 kg)  BMI 22.96 kg/m2  SpO2 92%  Physical Exam:  Well appearing 82 yo woman, NAD HEENT: Unremarkable Neck:  6 cm JVD, no thyromegally Back:  No CVA tenderness Lungs:  Clear with no wheezes. Well healed PPM incision. HEART:  Regular rate rhythm, no murmurs, no rubs, no clicks Abd:  soft, positive bowel sounds, no organomegally, no rebound, no guarding Ext:  2 plus pulses, no edema, no cyanosis, no clubbing Skin:  No rashes no nodules Neuro:  CN II through XII intact, motor grossly intact   DEVICE  Normal device function.  See PaceArt for details.   Assess/Plan: 1. Symptomatic sinus node dysfunction - she is s/p PPM and asymptomatic. 2. HTN - her blood pressure is well controlled. No change in her meds. She is encouraged to maintain a low sodium diet. 3. DDD PM - Her St. Jude device is working normally. Will recheck in several months  [name].D.

## 2016-03-05 NOTE — Patient Instructions (Signed)

## 2016-03-23 LAB — CUP PACEART INCLINIC DEVICE CHECK
Date Time Interrogation Session: 20170328123428
Implantable Lead Implant Date: 20160302
Implantable Lead Location: 753859
Implantable Lead Model: 1948
MDC IDC LEAD IMPLANT DT: 20160302
MDC IDC LEAD LOCATION: 753860
Pulse Gen Serial Number: 7730931

## 2016-06-07 ENCOUNTER — Ambulatory Visit (INDEPENDENT_AMBULATORY_CARE_PROVIDER_SITE_OTHER): Payer: Medicare HMO | Admitting: *Deleted

## 2016-06-07 ENCOUNTER — Telehealth: Payer: Self-pay | Admitting: Cardiology

## 2016-06-07 DIAGNOSIS — I495 Sick sinus syndrome: Secondary | ICD-10-CM | POA: Diagnosis not present

## 2016-06-07 NOTE — Telephone Encounter (Signed)
Attempted to confirm remote transmission with pt. No answer and was unable to leave a message.   

## 2016-06-07 NOTE — Progress Notes (Signed)
Remote pacemaker transmission.   

## 2016-06-14 LAB — CUP PACEART REMOTE DEVICE CHECK
Battery Remaining Longevity: 131 mo
Battery Remaining Percentage: 95.5 %
Battery Voltage: 3.01 V
Brady Statistic AP VP Percent: 1 %
Brady Statistic AS VP Percent: 32 %
Brady Statistic RV Percent Paced: 32 %
Date Time Interrogation Session: 20170612143805
Implantable Lead Implant Date: 20160302
Implantable Lead Location: 753859
Implantable Lead Model: 1948
Lead Channel Impedance Value: 400 Ohm
Lead Channel Impedance Value: 650 Ohm
Lead Channel Sensing Intrinsic Amplitude: 12 mV
Lead Channel Setting Pacing Amplitude: 1 V
Lead Channel Setting Pacing Amplitude: 2 V
Lead Channel Setting Pacing Pulse Width: 0.4 ms
Lead Channel Setting Sensing Sensitivity: 4 mV
MDC IDC LEAD IMPLANT DT: 20160302
MDC IDC LEAD LOCATION: 753860
MDC IDC MSMT LEADCHNL RA SENSING INTR AMPL: 2 mV
MDC IDC MSMT LEADCHNL RV PACING THRESHOLD AMPLITUDE: 0.75 V
MDC IDC MSMT LEADCHNL RV PACING THRESHOLD PULSEWIDTH: 0.4 ms
MDC IDC PG SERIAL: 7730931
MDC IDC STAT BRADY AP VS PERCENT: 1 %
MDC IDC STAT BRADY AS VS PERCENT: 67 %
MDC IDC STAT BRADY RA PERCENT PACED: 1 %
Pulse Gen Model: 2240

## 2016-06-17 ENCOUNTER — Encounter: Payer: Self-pay | Admitting: Cardiology

## 2016-08-04 ENCOUNTER — Other Ambulatory Visit (HOSPITAL_COMMUNITY): Payer: Self-pay | Admitting: Family Medicine

## 2016-08-04 ENCOUNTER — Ambulatory Visit (HOSPITAL_COMMUNITY)
Admission: RE | Admit: 2016-08-04 | Discharge: 2016-08-04 | Disposition: A | Discharge status: Home / Self Care | Payer: Medicare HMO | Source: Ambulatory Visit | Attending: Family Medicine | Admitting: Family Medicine

## 2016-08-04 DIAGNOSIS — M160 Bilateral primary osteoarthritis of hip: Secondary | ICD-10-CM | POA: Insufficient documentation

## 2016-08-04 DIAGNOSIS — W19XXXA Unspecified fall, initial encounter: Secondary | ICD-10-CM

## 2016-08-04 DIAGNOSIS — M8580 Other specified disorders of bone density and structure, unspecified site: Secondary | ICD-10-CM | POA: Diagnosis not present

## 2016-08-04 DIAGNOSIS — I714 Abdominal aortic aneurysm, without rupture: Secondary | ICD-10-CM | POA: Diagnosis not present

## 2016-08-04 DIAGNOSIS — T1490 Injury, unspecified: Secondary | ICD-10-CM | POA: Diagnosis present

## 2016-08-09 ENCOUNTER — Other Ambulatory Visit: Payer: Self-pay | Admitting: Cardiology

## 2016-09-06 ENCOUNTER — Ambulatory Visit (INDEPENDENT_AMBULATORY_CARE_PROVIDER_SITE_OTHER): Payer: Medicare HMO | Admitting: *Deleted

## 2016-09-06 DIAGNOSIS — I442 Atrioventricular block, complete: Secondary | ICD-10-CM

## 2016-09-06 NOTE — Progress Notes (Signed)
Remote pacemaker transmission.   

## 2016-09-09 ENCOUNTER — Encounter: Payer: Self-pay | Admitting: Cardiology

## 2016-09-15 LAB — CUP PACEART REMOTE DEVICE CHECK
Brady Statistic AS VP Percent: 39 %
Brady Statistic RA Percent Paced: 1.8 %
Date Time Interrogation Session: 20170911063820
Implantable Lead Implant Date: 20160302
Implantable Lead Location: 753859
Implantable Lead Location: 753860
Implantable Lead Model: 1948
Lead Channel Pacing Threshold Pulse Width: 0.4 ms
Lead Channel Sensing Intrinsic Amplitude: 12 mV
Lead Channel Setting Pacing Amplitude: 1.125
Lead Channel Setting Pacing Amplitude: 2 V
MDC IDC LEAD IMPLANT DT: 20160302
MDC IDC MSMT BATTERY REMAINING LONGEVITY: 130 mo
MDC IDC MSMT BATTERY REMAINING PERCENTAGE: 95.5 %
MDC IDC MSMT BATTERY VOLTAGE: 2.99 V
MDC IDC MSMT LEADCHNL RA IMPEDANCE VALUE: 400 Ohm
MDC IDC MSMT LEADCHNL RA PACING THRESHOLD AMPLITUDE: 0.5 V
MDC IDC MSMT LEADCHNL RA SENSING INTR AMPL: 2.2 mV
MDC IDC MSMT LEADCHNL RV IMPEDANCE VALUE: 660 Ohm
MDC IDC MSMT LEADCHNL RV PACING THRESHOLD AMPLITUDE: 0.875 V
MDC IDC MSMT LEADCHNL RV PACING THRESHOLD PULSEWIDTH: 0.4 ms
MDC IDC SET LEADCHNL RV PACING PULSEWIDTH: 0.4 ms
MDC IDC SET LEADCHNL RV SENSING SENSITIVITY: 4 mV
MDC IDC STAT BRADY AP VP PERCENT: 1 %
MDC IDC STAT BRADY AP VS PERCENT: 2.5 %
MDC IDC STAT BRADY AS VS PERCENT: 57 %
MDC IDC STAT BRADY RV PERCENT PACED: 39 %
Pulse Gen Model: 2240
Pulse Gen Serial Number: 7730931

## 2016-12-06 ENCOUNTER — Telehealth: Payer: Self-pay | Admitting: Cardiology

## 2016-12-06 ENCOUNTER — Ambulatory Visit (INDEPENDENT_AMBULATORY_CARE_PROVIDER_SITE_OTHER): Payer: Medicare HMO | Admitting: *Deleted

## 2016-12-06 DIAGNOSIS — I442 Atrioventricular block, complete: Secondary | ICD-10-CM

## 2016-12-06 NOTE — Telephone Encounter (Signed)
Spoke with pt and reminded pt of remote transmission that is due today. Pt verbalized understanding.   

## 2016-12-06 NOTE — Progress Notes (Signed)
Remote pacemaker transmission.   

## 2016-12-10 ENCOUNTER — Encounter: Payer: Self-pay | Admitting: Cardiology

## 2016-12-30 LAB — CUP PACEART REMOTE DEVICE CHECK
Battery Voltage: 3.01 V
Brady Statistic AP VP Percent: 2.1 %
Brady Statistic AS VP Percent: 57 %
Brady Statistic RA Percent Paced: 2.6 %
Implantable Lead Implant Date: 20160302
Implantable Lead Location: 753859
Implantable Lead Model: 1948
Implantable Pulse Generator Implant Date: 20160302
Lead Channel Impedance Value: 690 Ohm
Lead Channel Pacing Threshold Amplitude: 0.5 V
Lead Channel Pacing Threshold Pulse Width: 0.4 ms
Lead Channel Sensing Intrinsic Amplitude: 12 mV
Lead Channel Setting Pacing Amplitude: 2 V
Lead Channel Setting Sensing Sensitivity: 4 mV
MDC IDC LEAD IMPLANT DT: 20160302
MDC IDC LEAD LOCATION: 753860
MDC IDC MSMT BATTERY REMAINING LONGEVITY: 130 mo
MDC IDC MSMT BATTERY REMAINING PERCENTAGE: 95.5 %
MDC IDC MSMT LEADCHNL RA IMPEDANCE VALUE: 400 Ohm
MDC IDC MSMT LEADCHNL RA SENSING INTR AMPL: 3.2 mV
MDC IDC MSMT LEADCHNL RV PACING THRESHOLD AMPLITUDE: 0.75 V
MDC IDC MSMT LEADCHNL RV PACING THRESHOLD PULSEWIDTH: 0.4 ms
MDC IDC SESS DTM: 20171211172742
MDC IDC SET LEADCHNL RV PACING AMPLITUDE: 1 V
MDC IDC SET LEADCHNL RV PACING PULSEWIDTH: 0.4 ms
MDC IDC STAT BRADY AP VS PERCENT: 1.7 %
MDC IDC STAT BRADY AS VS PERCENT: 39 %
MDC IDC STAT BRADY RV PERCENT PACED: 59 %
Pulse Gen Model: 2240
Pulse Gen Serial Number: 7730931

## 2017-01-25 ENCOUNTER — Encounter: Payer: Self-pay | Admitting: Internal Medicine

## 2017-01-28 ENCOUNTER — Encounter: Payer: Self-pay | Admitting: Internal Medicine

## 2017-03-10 ENCOUNTER — Ambulatory Visit (INDEPENDENT_AMBULATORY_CARE_PROVIDER_SITE_OTHER): Payer: Medicare HMO | Admitting: Internal Medicine

## 2017-03-10 ENCOUNTER — Encounter: Payer: Self-pay | Admitting: Internal Medicine

## 2017-03-10 VITALS — BP 104/64 | HR 84 | Ht 68.0 in | Wt 144.0 lb

## 2017-03-10 DIAGNOSIS — I495 Sick sinus syndrome: Secondary | ICD-10-CM

## 2017-03-10 DIAGNOSIS — I442 Atrioventricular block, complete: Secondary | ICD-10-CM

## 2017-03-10 DIAGNOSIS — Z950 Presence of cardiac pacemaker: Secondary | ICD-10-CM

## 2017-03-10 LAB — CUP PACEART INCLINIC DEVICE CHECK
Battery Voltage: 3.01 V
Brady Statistic RA Percent Paced: 2.5 %
Brady Statistic RV Percent Paced: 69 %
Implantable Lead Implant Date: 20160302
Implantable Lead Implant Date: 20160302
Implantable Lead Location: 753860
Implantable Lead Model: 1948
Lead Channel Impedance Value: 400 Ohm
Lead Channel Impedance Value: 662.5 Ohm
Lead Channel Sensing Intrinsic Amplitude: 1.9 mV
Lead Channel Setting Pacing Amplitude: 1 V
Lead Channel Setting Pacing Amplitude: 2 V
MDC IDC LEAD LOCATION: 753859
MDC IDC MSMT LEADCHNL RA PACING THRESHOLD AMPLITUDE: 0.5 V
MDC IDC MSMT LEADCHNL RA PACING THRESHOLD PULSEWIDTH: 0.4 ms
MDC IDC MSMT LEADCHNL RV PACING THRESHOLD AMPLITUDE: 0.75 V
MDC IDC MSMT LEADCHNL RV PACING THRESHOLD PULSEWIDTH: 0.4 ms
MDC IDC PG IMPLANT DT: 20160302
MDC IDC PG SERIAL: 7730931
MDC IDC SESS DTM: 20180315102003
MDC IDC SET LEADCHNL RV PACING PULSEWIDTH: 0.4 ms
MDC IDC SET LEADCHNL RV SENSING SENSITIVITY: 4 mV

## 2017-03-10 NOTE — Patient Instructions (Signed)
Your physician wants you to follow-up in: 1 Year with Dr. Rodman Keyayor. You will receive a reminder letter in the mail two months in advance. If you don't receive a letter, please call our office to schedule the follow-up appointment.  Your physician recommends that you continue on your current medications as directed. Please refer to the Current Medication list given to you today.  Remote monitoring is used to monitor your Pacemaker of ICD from home. This monitoring reduces the number of office visits required to check your device to one time per year. It allows us to keep an eye on the functioning of your device to ensure it is working properly. You are scheduled for a device check from home on 6.14.18. You may send your transmission at any time that day. If you have a wireless device, the transmission will be sent automatically. After your physician reviews your transmission, you will receive a postcard with your next transmission date.  If you need a refill on your cardiac medications before your next appointment, please call your pharmacy.  Thank you for choosing [facility]!

## 2017-03-10 NOTE — Progress Notes (Signed)
HPI [name] returns today for followup. She is a pleasant 83 yo woman with a h/o sinus node dysfunction and high grade heart block, s/p PPM insertion. She also has HTN. In the interim she has done well with no chest pain or sob. No edema. She remains active cooking, cleaning and tending to her flowers. No syncope. In the interim, she has lost her son who died of CA and husband who died of compications of heart disease.  Allergies  Allergen Reactions  . Lisinopril Anaphylaxis  . Quinine Other (See Comments)    Pt states heart attack-like symptoms  . Penicillins Swelling and Other (See Comments)    Patient states that she had swelling at the injection site after her last Penicillin shot but no swelling in the throat.  . Sulfonamide Derivatives Rash     Current Outpatient Prescriptions  Medication Sig Dispense Refill  . amLODipine (NORVASC) 5 MG tablet Take 5 mg by mouth daily.  5  . aspirin 81 MG tablet Take 1 tablet (81 mg total) by mouth daily.    . brimonidine (ALPHAGAN) 0.15 % ophthalmic solution USE 1 DROP INTO LEFT EYE TWICE A DAY  2  . carvedilol (COREG) 6.25 MG tablet TAKE ONE TABLET BY MOUTH TWICE DAILY 60 tablet 11  . cetirizine (ZYRTEC) 10 MG tablet Take 10 mg by mouth daily.    . INVOKANA 300 MG TABS tablet Take 300 mg by mouth daily.  5  . latanoprost (XALATAN) 0.005 % ophthalmic solution INSTILL 1 DROP IN LEFT EYE NIGHTLY  6  . metFORMIN (GLUCOPHAGE) 500 MG tablet Take 500 mg by mouth 2 (two) times daily.  3  . omeprazole (PRILOSEC) 20 MG capsule      No current facility-administered medications for this visit.      Past Medical History:  Diagnosis Date  . CAD (coronary artery disease)    a. s/p PCI to LAD '08, Severe 3V CAD by cath 03/27/12 --> CABG  . CHF (congestive heart failure) (HCC)   . Complete heart block (HCC)    a. s/p STJ dual chamber pacemaker  . Depression   . Hypertension   . Hypothyroidism   . Mitral regurgitation    moderate by cath,  mild by echo 03/2012  . Osteoarthrosis, unspecified whether generalized or localized, unspecified site   . Paroxysmal atrial fibrillation (HCC)    a. identified s/p CABG b. OAC discontinued 02/2014 after no recurrence  . Personal history of other diseases of digestive system   . Type II or unspecified type diabetes mellitus without mention of complication, not stated as uncontrolled   . Unspecified glaucoma(365.9)   . Unspecified hemorrhoids without mention of complication     ROS:   All systems reviewed and negative except as noted in the HPI.   Past Surgical History:  Procedure Laterality Date  . CHOLECYSTECTOMY  12/2006  . CORONARY ARTERY BYPASS GRAFT  04/03/2012   Procedure: CORONARY ARTERY BYPASS GRAFTING (CABG);  Surgeon: Kerin PernaPeter Van Trigt, MD;  [facility];  Service: Open Heart Surgery;  Laterality: N/A;  CABG x four;  using left internal mammary artery and bilateral greater saphenous veins  . ERCP  2008  . LEFT HEART CATHETERIZATION WITH CORONARY ANGIOGRAM N/A 03/27/2012   Procedure: LEFT HEART CATHETERIZATION WITH CORONARY ANGIOGRAM;  Surgeon: Herby Abrahamhomas D Stuckey, MD;  [facility];  Service: Cardiovascular;  Laterality: N/A;  . MITRAL VALVE REPAIR  04/03/2012   Procedure: MITRAL VALVE REPAIR (  MVR);  Surgeon: Kerin Perna, MD;  Location: [facility] OR;  Service: Open Heart Surgery;  Laterality: N/A;  . PERMANENT PACEMAKER INSERTION N/A 02/26/2015   STJ Assurity dual chamber pacemaker implanted by Dr Johney Frame for symptomatic complete heart block     Family History  Problem Relation Age of Onset  . Heart disease Mother   . Heart disease Father 64    CABG  . Arrhythmia Brother     slow HR off and on since his 31s     Social History   Social History  . Marital status: Married    Spouse name: N/A  . Number of children: N/A  . Years of education: N/A   Occupational History  . Not on file.   Social History Main Topics  . Smoking status: Former Smoker    Types: Cigarettes     Quit date: 09/03/2004  . Smokeless tobacco: Never Used  . Alcohol use No  . Drug use: No  . Sexual activity: Not on file   Other Topics Concern  . Not on file   Social History Narrative  . No narrative on file     BP 104/64   Pulse 84   Ht 5\' 8"  (1.727 m)   Wt 144 lb (65.3 kg)   SpO2 92%   BMI 21.90 kg/m   Physical Exam:  Well appearing 83 yo woman, NAD HEENT: Unremarkable Neck:  5 cm JVD, no thyromegally Back:  No CVA tenderness Lungs:  Clear with no wheezes. Well healed PPM incision. HEART:  Regular rate rhythm, no murmurs, no rubs, no clicks Abd:  soft, positive bowel sounds, no organomegally, no rebound, no guarding Ext:  2 plus pulses, no edema, no cyanosis, no clubbing Skin:  No rashes no nodules Neuro:  CN II through XII intact, motor grossly intact   DEVICE  Normal device function.  See PaceArt for details.   Assess/Plan: 1. Symptomatic sinus node dysfunction/AV block - she is s/p PPM and asymptomatic. 2. HTN - her blood pressure is well controlled. No change in her meds. She is encouraged to maintain a low sodium diet. 3. DDD PM - Her St. Jude device is working normally. Will recheck in several months  Leonia Reeves.D.

## 2017-06-01 ENCOUNTER — Inpatient Hospital Stay (HOSPITAL_COMMUNITY)
Admission: EM | Admit: 2017-06-01 | Discharge: 2017-06-04 | DRG: 481 | Disposition: A | Discharge status: Skilled Nursing Facility | Payer: Medicare HMO | Attending: Internal Medicine | Admitting: Internal Medicine

## 2017-06-01 ENCOUNTER — Encounter (HOSPITAL_COMMUNITY): Payer: Self-pay

## 2017-06-01 ENCOUNTER — Emergency Department (HOSPITAL_COMMUNITY): Payer: Medicare HMO

## 2017-06-01 DIAGNOSIS — D62 Acute posthemorrhagic anemia: Secondary | ICD-10-CM | POA: Diagnosis not present

## 2017-06-01 DIAGNOSIS — S7291XA Unspecified fracture of right femur, initial encounter for closed fracture: Secondary | ICD-10-CM

## 2017-06-01 DIAGNOSIS — I480 Paroxysmal atrial fibrillation: Secondary | ICD-10-CM | POA: Diagnosis present

## 2017-06-01 DIAGNOSIS — I2510 Atherosclerotic heart disease of native coronary artery without angina pectoris: Secondary | ICD-10-CM | POA: Diagnosis present

## 2017-06-01 DIAGNOSIS — Z7984 Long term (current) use of oral hypoglycemic drugs: Secondary | ICD-10-CM | POA: Diagnosis not present

## 2017-06-01 DIAGNOSIS — M25551 Pain in right hip: Secondary | ICD-10-CM | POA: Diagnosis present

## 2017-06-01 DIAGNOSIS — S72001A Fracture of unspecified part of neck of right femur, initial encounter for closed fracture: Secondary | ICD-10-CM

## 2017-06-01 DIAGNOSIS — W010XXA Fall on same level from slipping, tripping and stumbling without subsequent striking against object, initial encounter: Secondary | ICD-10-CM | POA: Diagnosis present

## 2017-06-01 DIAGNOSIS — E118 Type 2 diabetes mellitus with unspecified complications: Secondary | ICD-10-CM

## 2017-06-01 DIAGNOSIS — D509 Iron deficiency anemia, unspecified: Secondary | ICD-10-CM | POA: Diagnosis not present

## 2017-06-01 DIAGNOSIS — E119 Type 2 diabetes mellitus without complications: Secondary | ICD-10-CM | POA: Diagnosis present

## 2017-06-01 DIAGNOSIS — I5032 Chronic diastolic (congestive) heart failure: Secondary | ICD-10-CM | POA: Diagnosis present

## 2017-06-01 DIAGNOSIS — Z950 Presence of cardiac pacemaker: Secondary | ICD-10-CM

## 2017-06-01 DIAGNOSIS — Z880 Allergy status to penicillin: Secondary | ICD-10-CM | POA: Diagnosis not present

## 2017-06-01 DIAGNOSIS — Z8249 Family history of ischemic heart disease and other diseases of the circulatory system: Secondary | ICD-10-CM

## 2017-06-01 DIAGNOSIS — S72141A Displaced intertrochanteric fracture of right femur, initial encounter for closed fracture: Secondary | ICD-10-CM

## 2017-06-01 DIAGNOSIS — Z419 Encounter for procedure for purposes other than remedying health state, unspecified: Secondary | ICD-10-CM

## 2017-06-01 DIAGNOSIS — Z882 Allergy status to sulfonamides status: Secondary | ICD-10-CM | POA: Diagnosis not present

## 2017-06-01 DIAGNOSIS — Y93H2 Activity, gardening and landscaping: Secondary | ICD-10-CM | POA: Diagnosis not present

## 2017-06-01 DIAGNOSIS — Z9861 Coronary angioplasty status: Secondary | ICD-10-CM

## 2017-06-01 DIAGNOSIS — H409 Unspecified glaucoma: Secondary | ICD-10-CM | POA: Diagnosis present

## 2017-06-01 DIAGNOSIS — Z7982 Long term (current) use of aspirin: Secondary | ICD-10-CM

## 2017-06-01 DIAGNOSIS — I110 Hypertensive heart disease with heart failure: Secondary | ICD-10-CM | POA: Diagnosis present

## 2017-06-01 DIAGNOSIS — I509 Heart failure, unspecified: Secondary | ICD-10-CM | POA: Diagnosis present

## 2017-06-01 DIAGNOSIS — I10 Essential (primary) hypertension: Secondary | ICD-10-CM | POA: Diagnosis present

## 2017-06-01 DIAGNOSIS — Z87891 Personal history of nicotine dependence: Secondary | ICD-10-CM

## 2017-06-01 DIAGNOSIS — Z952 Presence of prosthetic heart valve: Secondary | ICD-10-CM | POA: Diagnosis not present

## 2017-06-01 DIAGNOSIS — Z888 Allergy status to other drugs, medicaments and biological substances status: Secondary | ICD-10-CM

## 2017-06-01 DIAGNOSIS — I442 Atrioventricular block, complete: Secondary | ICD-10-CM

## 2017-06-01 DIAGNOSIS — W19XXXA Unspecified fall, initial encounter: Secondary | ICD-10-CM

## 2017-06-01 DIAGNOSIS — Z951 Presence of aortocoronary bypass graft: Secondary | ICD-10-CM | POA: Diagnosis not present

## 2017-06-01 HISTORY — DX: Acute posthemorrhagic anemia: D62

## 2017-06-01 LAB — TYPE AND SCREEN
ABO/RH(D): O NEG
Antibody Screen: POSITIVE
DAT, IGG: NEGATIVE

## 2017-06-01 LAB — URINALYSIS, ROUTINE W REFLEX MICROSCOPIC
Bacteria, UA: NONE SEEN
Bilirubin Urine: NEGATIVE
Glucose, UA: >=500 mg/dL — AB
Hgb urine dipstick: NEGATIVE
Ketones, ur: 20 mg/dL — AB
Nitrite: NEGATIVE
PH: 5 (ref 5.0–8.0)
Protein, ur: NEGATIVE mg/dL
SPECIFIC GRAVITY, URINE: 1.022 (ref 1.005–1.030)

## 2017-06-01 LAB — CBC WITH DIFFERENTIAL/PLATELET
BASOS ABS: 0 10*3/uL (ref 0.0–0.1)
Basophils Relative: 0 %
EOS PCT: 2 %
Eosinophils Absolute: 0.1 10*3/uL (ref 0.0–0.7)
HCT: 35.8 % — ABNORMAL LOW (ref 36.0–46.0)
Hemoglobin: 11.1 g/dL — ABNORMAL LOW (ref 12.0–15.0)
Lymphocytes Relative: 13 %
Lymphs Abs: 1.1 10*3/uL (ref 0.7–4.0)
MCH: 23.6 pg — AB (ref 26.0–34.0)
MCHC: 31 g/dL (ref 30.0–36.0)
MCV: 76.2 fL — ABNORMAL LOW (ref 78.0–100.0)
MONO ABS: 0.4 10*3/uL (ref 0.1–1.0)
Monocytes Relative: 5 %
Neutro Abs: 6.6 10*3/uL (ref 1.7–7.7)
Neutrophils Relative %: 80 %
PLATELETS: 235 10*3/uL (ref 150–400)
RBC: 4.7 MIL/uL (ref 3.87–5.11)
RDW: 15.9 % — AB (ref 11.5–15.5)
WBC: 8.2 10*3/uL (ref 4.0–10.5)

## 2017-06-01 LAB — BASIC METABOLIC PANEL
Anion gap: 9 (ref 5–15)
BUN: 17 mg/dL (ref 6–20)
CO2: 26 mmol/L (ref 22–32)
CREATININE: 0.89 mg/dL (ref 0.44–1.00)
Calcium: 8.9 mg/dL (ref 8.9–10.3)
Chloride: 107 mmol/L (ref 101–111)
GFR calc Af Amer: >60 mL/min (ref >60)
GFR, EST NON AFRICAN AMERICAN: 58 mL/min — AB (ref >60)
GLUCOSE: 138 mg/dL — AB (ref 65–99)
POTASSIUM: 3.5 mmol/L (ref 3.5–5.1)
Sodium: 142 mmol/L (ref 135–145)

## 2017-06-01 LAB — PROTIME-INR
INR: 0.96
Prothrombin Time: 12.8 seconds (ref 11.4–15.2)

## 2017-06-01 LAB — GLUCOSE, CAPILLARY: Glucose-Capillary: 167 mg/dL — ABNORMAL HIGH (ref 65–99)

## 2017-06-01 MED ORDER — BISACODYL 5 MG PO TBEC
5.0000 mg | DELAYED_RELEASE_TABLET | Freq: Every day | ORAL | Status: DC | PRN
  Administered 2017-06-04: 5 mg via ORAL
  Filled 2017-06-01: qty 1

## 2017-06-01 MED ORDER — FENTANYL CITRATE (PF) 100 MCG/2ML IJ SOLN
50.0000 ug | INTRAMUSCULAR | Status: AC | PRN
  Administered 2017-06-01 (×2): 50 ug via INTRAVENOUS
  Filled 2017-06-01 (×2): qty 2

## 2017-06-01 MED ORDER — FENTANYL CITRATE (PF) 100 MCG/2ML IJ SOLN
50.0000 ug | Freq: Once | INTRAMUSCULAR | Status: AC
  Administered 2017-06-01: 50 ug via INTRAVENOUS
  Filled 2017-06-01: qty 2

## 2017-06-01 MED ORDER — SODIUM CHLORIDE 0.9 % IV SOLN
INTRAVENOUS | Status: DC
  Administered 2017-06-01: 17:00:00 via INTRAVENOUS

## 2017-06-01 MED ORDER — HYDROCODONE-ACETAMINOPHEN 5-325 MG PO TABS
1.0000 | ORAL_TABLET | Freq: Four times a day (QID) | ORAL | Status: DC | PRN
  Administered 2017-06-02 – 2017-06-04 (×5): 1 via ORAL
  Filled 2017-06-01 (×4): qty 1

## 2017-06-01 MED ORDER — MORPHINE SULFATE (PF) 4 MG/ML IV SOLN
0.5000 mg | INTRAVENOUS | Status: DC | PRN
  Administered 2017-06-02 (×3): 0.52 mg via INTRAVENOUS
  Filled 2017-06-01 (×3): qty 1

## 2017-06-01 MED ORDER — BRIMONIDINE TARTRATE 0.15 % OP SOLN
1.0000 [drp] | Freq: Two times a day (BID) | OPHTHALMIC | Status: DC
  Administered 2017-06-02 – 2017-06-04 (×5): 1 [drp] via OPHTHALMIC
  Filled 2017-06-01: qty 5

## 2017-06-01 MED ORDER — LABETALOL HCL 5 MG/ML IV SOLN
5.0000 mg | INTRAVENOUS | Status: DC | PRN
  Filled 2017-06-01: qty 4

## 2017-06-01 MED ORDER — INSULIN ASPART 100 UNIT/ML ~~LOC~~ SOLN
0.0000 [IU] | SUBCUTANEOUS | Status: DC
  Administered 2017-06-02: 2 [IU] via SUBCUTANEOUS
  Administered 2017-06-02: 3 [IU] via SUBCUTANEOUS
  Administered 2017-06-03: 8 [IU] via SUBCUTANEOUS
  Administered 2017-06-03: 5 [IU] via SUBCUTANEOUS
  Administered 2017-06-03 (×2): 2 [IU] via SUBCUTANEOUS
  Administered 2017-06-03: 3 [IU] via SUBCUTANEOUS
  Administered 2017-06-03 – 2017-06-04 (×3): 2 [IU] via SUBCUTANEOUS

## 2017-06-01 MED ORDER — AMLODIPINE BESYLATE 5 MG PO TABS
5.0000 mg | ORAL_TABLET | Freq: Every day | ORAL | Status: DC
  Administered 2017-06-04: 5 mg via ORAL
  Filled 2017-06-01 (×2): qty 1

## 2017-06-01 MED ORDER — FLUOXETINE HCL 10 MG PO CAPS
10.0000 mg | ORAL_CAPSULE | Freq: Every day | ORAL | Status: DC
  Administered 2017-06-03 – 2017-06-04 (×2): 10 mg via ORAL
  Filled 2017-06-01 (×3): qty 1

## 2017-06-01 MED ORDER — ONDANSETRON HCL 4 MG/2ML IJ SOLN
4.0000 mg | Freq: Once | INTRAMUSCULAR | Status: AC
  Administered 2017-06-01: 4 mg via INTRAVENOUS
  Filled 2017-06-01: qty 2

## 2017-06-01 MED ORDER — LATANOPROST 0.005 % OP SOLN
1.0000 [drp] | Freq: Every day | OPHTHALMIC | Status: DC
Start: 2017-06-01 — End: 2017-06-04
  Administered 2017-06-02 – 2017-06-03 (×2): 1 [drp] via OPHTHALMIC
  Filled 2017-06-01: qty 2.5

## 2017-06-01 MED ORDER — PANTOPRAZOLE SODIUM 40 MG PO TBEC
40.0000 mg | DELAYED_RELEASE_TABLET | Freq: Every day | ORAL | Status: DC
  Administered 2017-06-03 – 2017-06-04 (×2): 40 mg via ORAL
  Filled 2017-06-01 (×2): qty 1

## 2017-06-01 MED ORDER — MORPHINE SULFATE (PF) 2 MG/ML IV SOLN: 0.5000 mg | INTRAVENOUS | Status: DC | PRN

## 2017-06-01 MED ORDER — POLYETHYLENE GLYCOL 3350 17 G PO PACK: 17.0000 g | PACK | Freq: Every day | ORAL | Status: DC | PRN

## 2017-06-01 MED ORDER — CARVEDILOL 6.25 MG PO TABS
6.2500 mg | ORAL_TABLET | Freq: Two times a day (BID) | ORAL | Status: DC
  Administered 2017-06-02 – 2017-06-04 (×4): 6.25 mg via ORAL
  Filled 2017-06-01 (×4): qty 1

## 2017-06-01 NOTE — ED Notes (Signed)
[facility] contacted for transport to [place] 

## 2017-06-01 NOTE — ED Provider Notes (Signed)
[facility]-EMERGENCY DEPT Provider Note   CSN: [id number] Arrival date & time: 06/01/17  1543   [name] P Lineback is a 83 y.o. female who presents to the ED via EMS after a trip and fall in the garden with complaint of right hip pain. Patient reports she was working in her yard gardening when she tripped over a wire causing her to fall on her right hip. She was not able to get up after the fall. Her neighbor called EMS. She had 50 mcg of fentanyl prior to arrival. She reports pain to her right hip that is worse with movement. She denies anticoagulant use. She denies head injury, loss of consciousness, numbness, tingling, weakness, chest pain, shortness of breath, abdominal pain, nausea, vomiting, back pain, neck pain, or other injury.  History   Chief Complaint Chief Complaint  Patient presents with  . Fall  . Hip Pain    RIGHT    HPI [name] P Gow is a 83 y.o. female.  HPI  Past Medical History:  Diagnosis Date  . CAD (coronary artery disease)    a. s/p PCI to LAD '08, Severe 3V CAD by cath 03/27/12 --> CABG  . CHF (congestive heart failure) (HCC)   . Complete heart block (HCC)    a. s/p STJ dual chamber pacemaker  . Depression   . Hypertension   . Hypothyroidism   . Mitral regurgitation    moderate by cath, mild by echo 03/2012  . Osteoarthrosis, unspecified whether generalized or localized, unspecified site   . Paroxysmal atrial fibrillation (HCC)    a. identified s/p CABG b. OAC discontinued 02/2014 after no recurrence  . Personal history of other diseases of digestive system   . Type II or unspecified type diabetes mellitus without mention of complication, not stated as uncontrolled   . Unspecified glaucoma(365.9)   . Unspecified hemorrhoids without mention of complication     Patient Active Problem List   Diagnosis Date Noted  . Microcytic anemia 06/01/2017  . Closed right hip fracture, initial encounter (HCC) 06/01/2017  . Pacemaker 05/30/2015  . Complete heart block (HCC)  02/24/2015  . Encounter for therapeutic drug monitoring 02/04/2014  . History of MI (myocardial infarction) 07/17/2012  . Hyperlipidemia LDL goal < 70 07/17/2012  . S/P CABG x 4 04/14/2012  . S/P MVR (mitral valve repair) 04/13/2012  . Ejection fraction < 50% 04/13/2012  . Medication intolerance 04/13/2012  . Coronary artery disease 03/28/2012  . Diabetes (HCC) 04/24/2009  . GLAUCOMA 04/24/2009  . Essential hypertension 04/24/2009  . HEMORRHOIDS 04/24/2009  . DEGENERATIVE JOINT DISEASE 04/24/2009  . DIVERTICULITIS, HX OF 04/24/2009    Past Surgical History:  Procedure Laterality Date  . CHOLECYSTECTOMY  12/2006  . CORONARY ARTERY BYPASS GRAFT  04/03/2012   Procedure: CORONARY ARTERY BYPASS GRAFTING (CABG);  Surgeon: Kerin PernaPeter Van Trigt, MD;  [facility];  Service: Open Heart Surgery;  Laterality: N/A;  CABG x four;  using left internal mammary artery and bilateral greater saphenous veins  . ERCP  2008  . LEFT HEART CATHETERIZATION WITH CORONARY ANGIOGRAM N/A 03/27/2012   Procedure: LEFT HEART CATHETERIZATION WITH CORONARY ANGIOGRAM;  Surgeon: Herby Abrahamhomas D Stuckey, MD;  [facility];  Service: Cardiovascular;  Laterality: N/A;  . MITRAL VALVE REPAIR  04/03/2012   Procedure: MITRAL VALVE REPAIR (MVR);  Surgeon: Kerin PernaPeter Van Trigt, MD;  [facility];  Service: Open Heart Surgery;  Laterality: N/A;  . PERMANENT PACEMAKER INSERTION N/A 02/26/2015   STJ Assurity dual chamber  pacemaker implanted by Dr Johney Frame for symptomatic complete heart block    OB History    No data available       Home Medications    Prior to Admission medications   Medication Sig Start Date End Date Taking? Authorizing Provider  amLODipine (NORVASC) 5 MG tablet Take 5 mg by mouth daily. 05/23/15  Yes [provider]  aspirin 81 MG tablet Take 1 tablet (81 mg total) by mouth daily. 03/10/15  Yes Seiler, Amber K, NP  brimonidine (ALPHAGAN) 0.15 % ophthalmic solution USE 1 DROP INTO LEFT EYE TWICE A DAY  12/22/15  Yes [provider]  carvedilol (COREG) 6.25 MG tablet TAKE ONE TABLET BY MOUTH TWICE DAILY 08/09/16  Yes Branch, Dorothe Pea, MD  FLUoxetine (PROZAC) 10 MG tablet Take 10 mg by mouth daily.   Yes [provider]  INVOKANA 300 MG TABS tablet Take 300 mg by mouth daily. 01/28/16  Yes [provider]  latanoprost (XALATAN) 0.005 % ophthalmic solution INSTILL 1 DROP IN LEFT EYE NIGHTLY 02/13/16  Yes [provider]  metFORMIN (GLUCOPHAGE) 500 MG tablet Take 500 mg by mouth daily with breakfast.  11/28/15  Yes [provider]  omeprazole (PRILOSEC) 20 MG capsule  12/11/10  Yes [provider]    Family History Family History  Problem Relation Age of Onset  . Heart disease Mother   . Heart disease Father 16       CABG  . Arrhythmia Brother        slow HR off and on since his 16s    Social History Social History  Substance Use Topics  . Smoking status: Former Smoker    Types: Cigarettes    Quit date: 09/03/2004  . Smokeless tobacco: Never Used  . Alcohol use No     Allergies   Lisinopril; Quinine; Penicillins; and Sulfonamide derivatives   Review of Systems Review of Systems  Constitutional: Negative for chills and fever.  HENT: Negative for congestion and sore throat.   Eyes: Negative for visual disturbance.  Respiratory: Negative for cough and shortness of breath.   Cardiovascular: Negative for chest pain.  Gastrointestinal: Negative for abdominal pain, nausea and vomiting.  Genitourinary: Negative for dysuria.  Musculoskeletal: Positive for arthralgias. Negative for back pain and neck pain.  Skin: Negative for rash.  Neurological: Negative for dizziness, syncope, weakness, light-headedness, numbness and headaches.     Physical Exam Updated Vital Signs BP (!) 146/73 (BP Location: Right Arm)   Pulse 90   Temp 99.9 F (37.7 C) (Oral)   Resp 20   Ht 5\' 7"  (1.702 m)   Wt 67.1 kg (148 lb)   SpO2 93%   BMI 23.18  kg/m   Physical Exam  Constitutional: She is oriented to person, place, and time. She appears well-developed and well-nourished. No distress.  Nontoxic appearing.  HENT:  Head: Normocephalic and atraumatic.  Right Ear: External ear normal.  Left Ear: External ear normal.  Mouth/Throat: Oropharynx is clear and moist.  Eyes: Conjunctivae are normal. Pupils are equal, round, and reactive to light. Right eye exhibits no discharge. Left eye exhibits no discharge.  Neck: Normal range of motion. Neck supple.  Cardiovascular: Normal rate, regular rhythm, normal heart sounds and intact distal pulses.  Exam reveals no gallop and no friction rub.   No murmur heard. Bilateral radial, posterior tibialis and dorsalis pedis pulses are intact.    Pulmonary/Chest: Effort normal and breath sounds normal. No respiratory distress. She has no  wheezes. She has no rales. She exhibits no tenderness.  Lungs are clear to ascultation bilaterally. Symmetric chest expansion bilaterally. No increased work of breathing. No rales or rhonchi.    Abdominal: Soft. There is no tenderness.  Musculoskeletal: She exhibits tenderness. She exhibits no edema.  Pain to her right hip with manipulation of her right leg. Slight shortening and external rotation of her right leg noted. No right knee or ankle tenderness to palpation. No left hip, knee or ankle tenderness palpation. No midline neck or back tenderness. No pelvic instability noted.  Lymphadenopathy:    She has no cervical adenopathy.  Neurological: She is alert and oriented to person, place, and time. No cranial nerve deficit or sensory deficit. She exhibits normal muscle tone. Coordination normal.  Patient is alert and oriented 3. Sensation is intact or bilateral upper and lower extremities.  Skin: Skin is warm and dry. Capillary refill takes less than 2 seconds. No rash noted. She is not diaphoretic. No erythema. No pallor.  Psychiatric: She has a normal mood and affect.  Her behavior is normal.  Nursing note and vitals reviewed.    ED Treatments / Results  Labs (all labs ordered are listed, but only abnormal results are displayed) Labs Reviewed  BASIC METABOLIC PANEL - Abnormal; Notable for the following:       Result Value   Glucose, Bld 138 (*)    GFR calc non Af Amer 58 (*)    All other components within normal limits  CBC WITH DIFFERENTIAL/PLATELET - Abnormal; Notable for the following:    Hemoglobin 11.1 (*)    HCT 35.8 (*)    MCV 76.2 (*)    MCH 23.6 (*)    RDW 15.9 (*)    All other components within normal limits  URINALYSIS, ROUTINE W REFLEX MICROSCOPIC - Abnormal; Notable for the following:    Glucose, UA >=500 (*)    Ketones, ur 20 (*)    Leukocytes, UA TRACE (*)    Squamous Epithelial / LPF 0-5 (*)    All other components within normal limits  GLUCOSE, CAPILLARY - Abnormal; Notable for the following:    Glucose-Capillary 167 (*)    All other components within normal limits  SURGICAL PCR SCREEN  PROTIME-INR  HEMOGLOBIN A1C  CBC  BASIC METABOLIC PANEL  TYPE AND SCREEN  TYPE AND SCREEN    EKG  EKG Interpretation None       Radiology Dg Chest 1 View  Result Date: 06/01/2017 CLINICAL DATA:  Fall with right hip pain EXAM: CHEST 1 VIEW COMPARISON:  02/27/2015 FINDINGS: Chronic cardiomegaly and aortic tortuosity. Status post CABG and mitral valve replacement. Stable dual-chamber pacer leads from the left. There is no edema, consolidation, effusion, or pneumothorax. Chronic deformity of the lateral left chest wall. No acute fracture noted. IMPRESSION: Stable from prior.  No evidence of acute disease. Electronically Signed   By: Marnee Spring M.D.   On: 06/01/2017 17:14   Dg Hip Unilat With Pelvis 2-3 Views Right  Result Date: 06/01/2017 CLINICAL DATA:  Right hip pain after fall at home today. EXAM: DG HIP (WITH OR WITHOUT PELVIS) 2-3V RIGHT COMPARISON:  None. FINDINGS: Comminuted and displaced fracture is seen involving the  intertrochanteric region of the proximal right femur. Left hip appears normal. IMPRESSION: Comminuted and displaced intertrochanteric fracture of the proximal right femur. Electronically Signed   By: Lupita Raider, M.D.   On: 06/01/2017 17:15    Procedures Procedures (including critical care time)  Medications Ordered in ED Medications  0.9 %  sodium chloride infusion ( Intravenous New Bag/Given 06/01/17 1642)  FLUoxetine (PROZAC) capsule 10 mg (not administered)  carvedilol (COREG) tablet 6.25 mg (not administered)  brimonidine (ALPHAGAN) 0.15 % ophthalmic solution 1 drop (1 drop Left Eye Not Given 06/01/17 2200)  latanoprost (XALATAN) 0.005 % ophthalmic solution 1 drop (not administered)  pantoprazole (PROTONIX) EC tablet 40 mg (not administered)  amLODipine (NORVASC) tablet 5 mg (not administered)  HYDROcodone-acetaminophen (NORCO/VICODIN) 5-325 MG per tablet 1-2 tablet (not administered)  insulin aspart (novoLOG) injection 0-15 Units (0 Units Subcutaneous Not Given 06/01/17 2045)  polyethylene glycol (MIRALAX / GLYCOLAX) packet 17 g (not administered)  bisacodyl (DULCOLAX) EC tablet 5 mg (not administered)  labetalol (NORMODYNE,TRANDATE) injection 5-10 mg (not administered)  morphine 4 MG/ML injection 0.52 mg (0.52 mg Intravenous Given 06/02/17 0006)  ondansetron (ZOFRAN) injection 4 mg (4 mg Intravenous Given 06/01/17 1643)  fentaNYL (SUBLIMAZE) injection 50 mcg (50 mcg Intravenous Given 06/01/17 2317)  fentaNYL (SUBLIMAZE) injection 50 mcg (50 mcg Intravenous Given 06/01/17 1924)     Initial Impression / Assessment and Plan / ED Course  I have reviewed the triage vital signs and the nursing notes.  Pertinent labs & imaging results that were available during my care of the patient were reviewed by me and considered in my medical decision making (see chart for details).    This  is a 83 y.o. female who presents to the ED via EMS after a trip and fall in the garden with complaint of right hip  pain. Patient reports she was working in her yard gardening when she tripped over a wire causing her to fall on her right hip. She was not able to get up after the fall. Her neighbor called EMS. She had 50 mcg of fentanyl prior to arrival. She reports pain to her right hip that is worse with movement. She denies anticoagulant use. She denies head injury, neck pain or back pain.  On exam the patient is afebrile nontoxic appearing. Patient has pain at her right hip with manipulation of her right leg. She has slight shortening and external rotation of her right leg. Good dorsalis pedis and posterior tibialis pulses bilaterally. Good capillary refill. She is neurovascularly intact. No other injuries noted.  X-ray of her right hip with pelvis shows a comminuted and displaced intertrochanteric fracture of the proximal right femur. I consulted with orthopedic surgeon Dr. Renaye Rakers who would like the patient admitted to medicine at [facility], NPO after midnight and he will plan on surgery tomorrow.  Patient agrees with plan at reevaluation. Will provide with more pain medication. She reports fentanyl is working well for her.  I consulted with Dr. Antionette Char from [facility] who accepted the patient for admission and will admit patient to [facility] for surgery.   This patient was discussed with and evaluated by Dr. Particia Nearing who agrees with assessment and plan.   Final Clinical Impressions(s) / ED Diagnoses   Final diagnoses:  Fall, initial encounter  Closed displaced intertrochanteric fracture of right femur, initial encounter [facility])    New Prescriptions Current Discharge Medication List       Everlene Farrier, Cordelia Poche 06/02/17 0128    Jacalyn Lefevre, MD 06/06/17 1103

## 2017-06-01 NOTE — ED Triage Notes (Signed)
PT RECEIVED FROM HOME VIA EMS FOR A FALL WITH RIGHT HIP PAIN. PER EMS, THE TRIP AND FELL IN HER GARDEN, AND WAS LYING THERE FOR 45 MINUTES. PT WAS COVERED IN ANTS AND DIRT UPON ARRIVAL.

## 2017-06-01 NOTE — ED Notes (Signed)
[facility] Expected date:  Expected time:  Means of arrival:  Comments: [facility]/hip fx

## 2017-06-01 NOTE — ED Notes (Signed)
Patient transported to X-ray 

## 2017-06-01 NOTE — ED Notes (Signed)
PER BLOOD BANK, IF THE PT SHOULD NEED A TRANSFUSION, HER RESULTS INDICATE POSITIVE ANTIBODIES, THEREFORE CROSSING WILL TAKEN A WHILE.

## 2017-06-01 NOTE — ED Notes (Addendum)
EKG given to EDP,Goldston,.MD., for review. 

## 2017-06-01 NOTE — H&P (Signed)
History and Physical    [name] ZOX:[id number] DOB: 1932/07/27 DOA: 06/01/2017  PCP: [name], MD   Patient coming from: Home  Chief Complaint: Fall with right hip pain  HPI: [name] is a 83 y.o. female with medical history significant for hypertension, type 2 diabetes mellitus, and coronary artery disease status post CABG in 2013, now presenting to the emergency department with severe right hip pain after a fall at home. Patient reports that she was in her usual state of health and "having a great day," until she tripped while pulling weeds in her garden. She landed on her right side without any head strike or loss of consciousness, but noted immediate and severe pain in the right hip. She was unable to get up. EMS was called out and she was transported to the hospital. Patient denies any anginal symptoms since her CABG 5 years ago. She reports working in her garden without any dyspnea, and also notes that she can ascend a flight of stairs without difficulty.  ED Course: Upon arrival to the ED, patient is found to be afebrile, saturating 88% on room air, and with vitals otherwise stable. EKG features paced rhythm and chest x-ray is negative for acute cardiopulmonary disease. Radiographs of the hip demonstrated a comminuted and displaced intertrochanteric fracture of the right proximal femur. Chemistry panels unremarkable and CBC is notable for a crescentic anemia with hemoglobin of 11.1 and MCV of 76.2. INR is within the normal limits. Patient was given a gentle IV fluid hydration and treated with Zofran and fentanyl in the ED. Orthopedic surgery was consulted and requested a medical admission of her muscles, hospital for possible surgery tomorrow afternoon. Patient remained hemodynamically stable in the ED and has not been in any respiratory distress. She'll be admitted to the medical surgical unit at [facility] for ongoing evaluation and management of right hip  fracture.  Review of Systems:  All other systems reviewed and apart from HPI, are negative.  Past Medical History:  Diagnosis Date  . CAD (coronary artery disease)    a. s/p PCI to LAD '08, Severe 3V CAD by cath 03/27/12 --> CABG  . CHF (congestive heart failure) (HCC)   . Complete heart block (HCC)    a. s/p STJ dual chamber pacemaker  . Depression   . Hypertension   . Hypothyroidism   . Mitral regurgitation    moderate by cath, mild by echo 03/2012  . Osteoarthrosis, unspecified whether generalized or localized, unspecified site   . Paroxysmal atrial fibrillation (HCC)    a. identified s/p CABG b. OAC discontinued 02/2014 after no recurrence  . Personal history of other diseases of digestive system   . Type II or unspecified type diabetes mellitus without mention of complication, not stated as uncontrolled   . Unspecified glaucoma(365.9)   . Unspecified hemorrhoids without mention of complication     Past Surgical History:  Procedure Laterality Date  . CHOLECYSTECTOMY  12/2006  . CORONARY ARTERY BYPASS GRAFT  04/03/2012   Procedure: CORONARY ARTERY BYPASS GRAFTING (CABG);  Surgeon: Kerin Perna, MD;  Location: [facility] OR;  Service: Open Heart Surgery;  Laterality: N/A;  CABG x four;  using left internal mammary artery and bilateral greater saphenous veins  . ERCP  2008  . LEFT HEART CATHETERIZATION WITH CORONARY ANGIOGRAM N/A 03/27/2012   Procedure: LEFT HEART CATHETERIZATION WITH CORONARY ANGIOGRAM;  Surgeon: Herby Abraham, MD;  [facility];  Service: Cardiovascular;  Laterality: N/A;  .  MITRAL VALVE REPAIR  04/03/2012   Procedure: MITRAL VALVE REPAIR (MVR);  Surgeon: Kerin PernaPeter Van Trigt, MD;  [facility];  Service: Open Heart Surgery;  Laterality: N/A;  . PERMANENT PACEMAKER INSERTION N/A 02/26/2015   STJ Assurity dual chamber pacemaker implanted by Dr Johney FrameAllred for symptomatic complete heart block     reports that she quit smoking about 12 years ago. Her smoking use included  Cigarettes. She has never used smokeless tobacco. She reports that she does not drink alcohol or use drugs.  Allergies  Allergen Reactions  . Lisinopril Anaphylaxis  . Quinine Other (See Comments)    Pt states heart attack-like symptoms  . Penicillins Swelling and Other (See Comments)    Patient states that she had swelling at the injection site after her last Penicillin shot but no swelling in the throat.  . Sulfonamide Derivatives Rash    Family History  Problem Relation Age of Onset  . Heart disease Mother   . Heart disease Father [id number]       CABG  . Arrhythmia Brother        slow HR off and on since his 6820s     Prior to Admission medications   Medication Sig Start Date End Date Taking? Authorizing Provider  amLODipine (NORVASC) 5 MG tablet Take 5 mg by mouth daily. 05/23/15  Yes [provider]  aspirin 81 MG tablet Take 1 tablet (81 mg total) by mouth daily. 03/10/15  Yes Seiler, Amber K, NP  brimonidine (ALPHAGAN) 0.15 % ophthalmic solution USE 1 DROP INTO LEFT EYE TWICE A DAY 12/22/15  Yes [provider]  carvedilol (COREG) 6.25 MG tablet TAKE ONE TABLET BY MOUTH TWICE DAILY 08/09/16  Yes Branch, Dorothe PeaJonathan F, MD  FLUoxetine (PROZAC) 10 MG tablet Take 10 mg by mouth daily.   Yes [provider]  INVOKANA 300 MG TABS tablet Take 300 mg by mouth daily. 01/28/16  Yes [provider]  latanoprost (XALATAN) 0.005 % ophthalmic solution INSTILL 1 DROP IN LEFT EYE NIGHTLY 02/13/16  Yes [provider]  metFORMIN (GLUCOPHAGE) 500 MG tablet Take 500 mg by mouth daily with breakfast.  11/28/15  Yes [provider]  omeprazole (PRILOSEC) 20 MG capsule  12/11/10  Yes [provider]    Physical Exam: Vitals:   06/01/17 1800 06/01/17 1817 06/01/17 1900 06/01/17 2000  BP: (!) 148/72 (!) 148/72 (!) 150/65 (!) 146/70  Pulse: 83 82 87 81  Resp:  16    Temp:      TempSrc:  Oral    SpO2: 96% 97% 96% 97%  Weight:      Height:           Constitutional: NAD, calm, appears uncomfortable Eyes: PERTLA, lids and conjunctivae normal ENMT: Mucous membranes are dry. Posterior pharynx clear of any exudate or lesions.   Neck: normal, supple, no masses, no thyromegaly Respiratory: clear to auscultation bilaterally, no wheezing, no crackles. Normal respiratory effort.    Cardiovascular: S1 & S2 heard, regular rate and rhythm. No extremity edema. 2+ pedal pulses. No significant JVD. Abdomen: No distension, no tenderness, no masses palpated. Bowel sounds normal.  Musculoskeletal: no clubbing / cyanosis. Right hip exquisitely tender, pedal pulse appreciated and plantar-/dorsiflexion intact. Normal muscle tone.  Skin: no significant rashes, lesions, ulcers. Warm, dry, well-perfused. Poor turgor.  Neurologic: CN 2-12 grossly intact. Sensation intact, DTR normal. Strength 5/5 in all 4 limbs.  Psychiatric:  Alert and oriented x 3. Pleasant and cooperative. .[name]  Labs on Admission: I have personally reviewed following labs and imaging studies  CBC:  Recent Labs Lab 06/01/17 1631  WBC 8.2  NEUTROABS 6.6  HGB 11.1*  HCT 35.8*  MCV 76.2*  PLT 235   Basic Metabolic Panel:  Recent Labs Lab 06/01/17 1631  NA 142  K 3.5  CL 107  CO2 26  GLUCOSE 138*  BUN 17  CREATININE 0.89  CALCIUM 8.9   GFR: Estimated Creatinine Clearance: 46.6 mL/min (by C-G formula based on SCr of 0.89 mg/dL). Liver Function Tests: No results for input(s): AST, ALT, ALKPHOS, BILITOT, PROT, ALBUMIN in the last 168 hours. No results for input(s): LIPASE, AMYLASE in the last 168 hours. No results for input(s): AMMONIA in the last 168 hours. Coagulation Profile:  Recent Labs Lab 06/01/17 1631  INR 0.96   Cardiac Enzymes: No results for input(s): CKTOTAL, CKMB, CKMBINDEX, TROPONINI in the last 168 hours. BNP (last 3 results) No results for input(s): PROBNP in the last 8760 hours. HbA1C: No results for input(s): HGBA1C in the last 72  hours. CBG: No results for input(s): GLUCAP in the last 168 hours. Lipid Profile: No results for input(s): CHOL, HDL, LDLCALC, TRIG, CHOLHDL, LDLDIRECT in the last 72 hours. Thyroid Function Tests: No results for input(s): TSH, T4TOTAL, FREET4, T3FREE, THYROIDAB in the last 72 hours. Anemia Panel: No results for input(s): VITAMINB12, FOLATE, FERRITIN, TIBC, IRON, RETICCTPCT in the last 72 hours. Urine analysis:    Component Value Date/Time   COLORURINE YELLOW 03/28/2012 0056   APPEARANCEUR CLEAR 03/28/2012 0056   LABSPEC 1.020 03/28/2012 0056   PHURINE 6.5 03/28/2012 0056   GLUCOSEU NEGATIVE 03/28/2012 0056   HGBUR NEGATIVE 03/28/2012 0056   BILIRUBINUR NEGATIVE 03/28/2012 0056   KETONESUR NEGATIVE 03/28/2012 0056   PROTEINUR NEGATIVE 03/28/2012 0056   UROBILINOGEN 0.2 03/28/2012 0056   NITRITE NEGATIVE 03/28/2012 0056   LEUKOCYTESUR SMALL (A) 03/28/2012 0056   Sepsis Labs: @LABRCNTIP (procalcitonin:4,lacticidven:4) )No results found for this or any previous visit (from the past 240 hour(s)).   Radiological Exams on Admission: Dg Chest 1 View  Result Date: 06/01/2017 CLINICAL DATA:  Fall with right hip pain EXAM: CHEST 1 VIEW COMPARISON:  02/27/2015 FINDINGS: Chronic cardiomegaly and aortic tortuosity. Status post CABG and mitral valve replacement. Stable dual-chamber pacer leads from the left. There is no edema, consolidation, effusion, or pneumothorax. Chronic deformity of the lateral left chest wall. No acute fracture noted. IMPRESSION: Stable from prior.  No evidence of acute disease. Electronically Signed   By: Marnee Spring M.D.   On: 06/01/2017 17:14   Dg Hip Unilat With Pelvis 2-3 Views Right  Result Date: 06/01/2017 CLINICAL DATA:  Right hip pain after fall at home today. EXAM: DG HIP (WITH OR WITHOUT PELVIS) 2-3V RIGHT COMPARISON:  None. FINDINGS: Comminuted and displaced fracture is seen involving the intertrochanteric region of the proximal right femur. Left hip appears  normal. IMPRESSION: Comminuted and displaced intertrochanteric fracture of the proximal right femur. Electronically Signed   By: Lupita Raider, M.D.   On: 06/01/2017 17:15    EKG: Independently reviewed. Paced rhythm.  Assessment/Plan  1. Right hip fracture - Pt presents with severe right hip pain, unable to bear weight, after a mechanical fall at home  - Radiographs demonstrated right intertrochanteric femur fracture  - Orthopedic surgery is consulting and much appreciated  - Based on the available data, [name] presents an estimated 0.9% risk probability for perioperative MI or cardiac arrest per Nolon Nations al  - Plan  to keep NPO after midnight, provide gentle IVF hydration, continue pain-control  - VTE ppx per the surgical team   2. CAD - Pt is s/p CABG in 2013 with no angina since  - She is managed medically with ASA 81 and Coreg - Hold ASA preoperatively, continue Coreg as tolerated    3. Hypertension - BP at goal  - Continue Coreg, Norvasc    4. Type II DM - A1c was 7.7% in 2016  - Managed at home with Invokana and metformin  - Check CBG q4h while NPO and start a moderate-intensity SSI   5. Microcytic anemia  - Hgb is 11.1 on admission, down slightly from 12.3 in 2016  - Denies melena or hematochezia; no bleeding evident  - Type and screen will be performed   6. Complete heart block  - PPM is present and pacing     DVT prophylaxis: per surgical team  Code Status: Full  Family Communication: Daughter updated at bedside Disposition Plan: Admit to med-surg at [facility] Consults called: Orthopedic surgery Admission status: Inpatient   Briscoe Deutscher, MD [facility] Pager [phone]  If 7PM-7AM, please contact night-coverage [email or web address] Password [email or web address]  06/01/2017, 8:40 PM

## 2017-06-02 ENCOUNTER — Inpatient Hospital Stay (HOSPITAL_COMMUNITY): Payer: Medicare HMO | Admitting: Anesthesiology

## 2017-06-02 ENCOUNTER — Encounter (HOSPITAL_COMMUNITY)
Admission: EM | Disposition: A | Discharge status: Skilled Nursing Facility | Payer: Self-pay | Source: Home / Self Care | Attending: Internal Medicine

## 2017-06-02 ENCOUNTER — Inpatient Hospital Stay (HOSPITAL_COMMUNITY): Payer: Medicare HMO

## 2017-06-02 ENCOUNTER — Encounter (HOSPITAL_COMMUNITY): Payer: Self-pay | Admitting: Anesthesiology

## 2017-06-02 DIAGNOSIS — S72001A Fracture of unspecified part of neck of right femur, initial encounter for closed fracture: Secondary | ICD-10-CM

## 2017-06-02 HISTORY — PX: INTRAMEDULLARY (IM) NAIL INTERTROCHANTERIC: SHX5875

## 2017-06-02 LAB — GLUCOSE, CAPILLARY
GLUCOSE-CAPILLARY: 138 mg/dL — AB (ref 65–99)
GLUCOSE-CAPILLARY: 93 mg/dL (ref 65–99)
Glucose-Capillary: 109 mg/dL — ABNORMAL HIGH (ref 65–99)
Glucose-Capillary: 177 mg/dL — ABNORMAL HIGH (ref 65–99)

## 2017-06-02 LAB — SURGICAL PCR SCREEN
MRSA, PCR: NEGATIVE
Staphylococcus aureus: POSITIVE — AB

## 2017-06-02 SURGERY — FIXATION, FRACTURE, INTERTROCHANTERIC, WITH INTRAMEDULLARY ROD
Anesthesia: Spinal | Site: Hip | Laterality: Right

## 2017-06-02 MED ORDER — PHENOL 1.4 % MT LIQD: 1.0000 | OROMUCOSAL | Status: DC | PRN

## 2017-06-02 MED ORDER — PROMETHAZINE HCL 25 MG/ML IJ SOLN: 6.2500 mg | INTRAMUSCULAR | Status: DC | PRN

## 2017-06-02 MED ORDER — ASPIRIN EC 325 MG PO TBEC
325.0000 mg | DELAYED_RELEASE_TABLET | Freq: Every day | ORAL | Status: DC
  Administered 2017-06-03 – 2017-06-04 (×2): 325 mg via ORAL
  Filled 2017-06-02 (×2): qty 1

## 2017-06-02 MED ORDER — HYDROCODONE-ACETAMINOPHEN 5-325 MG PO TABS: 1.0000 | ORAL_TABLET | Freq: Four times a day (QID) | ORAL | 0 refills | Status: DC | PRN

## 2017-06-02 MED ORDER — POVIDONE-IODINE 10 % EX SWAB: 2.0000 "application " | Freq: Once | CUTANEOUS | Status: DC

## 2017-06-02 MED ORDER — PROPOFOL 10 MG/ML IV BOLUS
INTRAVENOUS | Status: DC | PRN
Start: 2017-06-02 — End: 2017-06-02
  Administered 2017-06-02: 30 mg via INTRAVENOUS

## 2017-06-02 MED ORDER — BUPIVACAINE HCL (PF) 0.75 % IJ SOLN
INTRAMUSCULAR | Status: DC | PRN
  Administered 2017-06-02: 12 mg via INTRATHECAL

## 2017-06-02 MED ORDER — ONDANSETRON HCL 4 MG/2ML IJ SOLN
INTRAMUSCULAR | Status: AC
  Filled 2017-06-02: qty 2

## 2017-06-02 MED ORDER — PHENYLEPHRINE HCL 10 MG/ML IJ SOLN
INTRAMUSCULAR | Status: DC | PRN
  Administered 2017-06-02: 120 ug via INTRAVENOUS
  Administered 2017-06-02 (×2): 80 ug via INTRAVENOUS

## 2017-06-02 MED ORDER — LACTATED RINGERS IV SOLN
INTRAVENOUS | Status: DC | PRN
  Administered 2017-06-02: 12:00:00 via INTRAVENOUS

## 2017-06-02 MED ORDER — HYDROMORPHONE HCL 1 MG/ML IJ SOLN
0.2500 mg | INTRAMUSCULAR | Status: DC | PRN
  Administered 2017-06-02 (×2): 0.5 mg via INTRAVENOUS

## 2017-06-02 MED ORDER — 0.9 % SODIUM CHLORIDE (POUR BTL) OPTIME
TOPICAL | Status: DC | PRN
  Administered 2017-06-02: 1000 mL

## 2017-06-02 MED ORDER — CEFAZOLIN SODIUM-DEXTROSE 2-4 GM/100ML-% IV SOLN
2.0000 g | INTRAVENOUS | Status: DC
  Filled 2017-06-02: qty 100

## 2017-06-02 MED ORDER — MAGNESIUM CITRATE PO SOLN: 1.0000 | Freq: Once | ORAL | Status: DC | PRN

## 2017-06-02 MED ORDER — DOCUSATE SODIUM 100 MG PO CAPS
100.0000 mg | ORAL_CAPSULE | Freq: Two times a day (BID) | ORAL | Status: DC
  Administered 2017-06-02 – 2017-06-04 (×4): 100 mg via ORAL
  Filled 2017-06-02 (×4): qty 1

## 2017-06-02 MED ORDER — CHLORHEXIDINE GLUCONATE 4 % EX LIQD: 60.0000 mL | Freq: Once | CUTANEOUS | Status: DC

## 2017-06-02 MED ORDER — PROPOFOL 10 MG/ML IV BOLUS
INTRAVENOUS | Status: AC
  Filled 2017-06-02: qty 20

## 2017-06-02 MED ORDER — ALBUMIN HUMAN 5 % IV SOLN
INTRAVENOUS | Status: DC | PRN
  Administered 2017-06-02: 15:00:00 via INTRAVENOUS

## 2017-06-02 MED ORDER — MENTHOL 3 MG MT LOZG: 1.0000 | LOZENGE | OROMUCOSAL | Status: DC | PRN

## 2017-06-02 MED ORDER — HYDROCODONE-ACETAMINOPHEN 5-325 MG PO TABS
ORAL_TABLET | ORAL | Status: AC
  Filled 2017-06-02: qty 1

## 2017-06-02 MED ORDER — SODIUM CHLORIDE 0.9 % IV SOLN
INTRAVENOUS | Status: DC
  Administered 2017-06-03 – 2017-06-04 (×2): via INTRAVENOUS

## 2017-06-02 MED ORDER — MORPHINE SULFATE (PF) 4 MG/ML IV SOLN: 2.0000 mg | INTRAVENOUS | Status: DC | PRN

## 2017-06-02 MED ORDER — NEOSTIGMINE METHYLSULFATE 5 MG/5ML IV SOSY
PREFILLED_SYRINGE | INTRAVENOUS | Status: AC
  Filled 2017-06-02: qty 5

## 2017-06-02 MED ORDER — FENTANYL CITRATE (PF) 250 MCG/5ML IJ SOLN
INTRAMUSCULAR | Status: AC
  Filled 2017-06-02: qty 5

## 2017-06-02 MED ORDER — CEFAZOLIN SODIUM-DEXTROSE 2-4 GM/100ML-% IV SOLN
2.0000 g | Freq: Four times a day (QID) | INTRAVENOUS | Status: AC
  Administered 2017-06-02 – 2017-06-03 (×2): 2 g via INTRAVENOUS
  Filled 2017-06-02 (×2): qty 100

## 2017-06-02 MED ORDER — PHENYLEPHRINE HCL 10 MG/ML IJ SOLN
INTRAMUSCULAR | Status: DC | PRN
  Administered 2017-06-02: 30 ug/min via INTRAVENOUS

## 2017-06-02 MED ORDER — FENTANYL CITRATE (PF) 100 MCG/2ML IJ SOLN
INTRAMUSCULAR | Status: DC | PRN
  Administered 2017-06-02: 50 ug via INTRAVENOUS

## 2017-06-02 MED ORDER — HYDROMORPHONE HCL 1 MG/ML IJ SOLN
INTRAMUSCULAR | Status: AC
  Filled 2017-06-02: qty 1

## 2017-06-02 MED ORDER — ACETAMINOPHEN 500 MG PO TABS: 1000.0000 mg | ORAL_TABLET | Freq: Once | ORAL | Status: DC

## 2017-06-02 MED ORDER — ONDANSETRON HCL 4 MG/2ML IJ SOLN
INTRAMUSCULAR | Status: DC | PRN
  Administered 2017-06-02: 4 mg via INTRAVENOUS

## 2017-06-02 MED ORDER — DOCUSATE SODIUM 100 MG PO CAPS: 100.0000 mg | ORAL_CAPSULE | Freq: Two times a day (BID) | ORAL | 0 refills | Status: DC

## 2017-06-02 MED ORDER — PROPOFOL 500 MG/50ML IV EMUL
INTRAVENOUS | Status: DC | PRN
  Administered 2017-06-02: 50 ug/kg/min via INTRAVENOUS

## 2017-06-02 MED ORDER — GLUCERNA SHAKE PO LIQD
237.0000 mL | Freq: Two times a day (BID) | ORAL | Status: DC
  Administered 2017-06-03 – 2017-06-04 (×3): 237 mL via ORAL

## 2017-06-02 MED ORDER — SENNA 8.6 MG PO TABS
1.0000 | ORAL_TABLET | Freq: Two times a day (BID) | ORAL | Status: DC
  Administered 2017-06-03 – 2017-06-04 (×3): 8.6 mg via ORAL
  Filled 2017-06-02 (×4): qty 1

## 2017-06-02 MED ORDER — ASPIRIN EC 325 MG PO TBEC: 325.0000 mg | DELAYED_RELEASE_TABLET | Freq: Every day | ORAL | 0 refills | Status: DC

## 2017-06-02 SURGICAL SUPPLY — 37 items
BIT DRILL AO GAMMA 4.2X180 (BIT) ×2 IMPLANT
BNDG COHESIVE 4X5 TAN STRL (GAUZE/BANDAGES/DRESSINGS) ×3 IMPLANT
BNDG GAUZE ELAST 4 BULKY (GAUZE/BANDAGES/DRESSINGS) ×3 IMPLANT
COVER PERINEAL POST (MISCELLANEOUS) ×3 IMPLANT
COVER SURGICAL LIGHT HANDLE (MISCELLANEOUS) ×3 IMPLANT
DRAPE STERI IOBAN 125X83 (DRAPES) ×3 IMPLANT
DRSG MEPILEX BORDER 4X4 (GAUZE/BANDAGES/DRESSINGS) ×9 IMPLANT
DURAPREP 26ML APPLICATOR (WOUND CARE) ×3 IMPLANT
ELECT REM PT RETURN 9FT ADLT (ELECTROSURGICAL) ×3
ELECTRODE REM PT RTRN 9FT ADLT (ELECTROSURGICAL) ×1 IMPLANT
GLOVE BIO SURGEON STRL SZ7.5 (GLOVE) ×6 IMPLANT
GLOVE BIOGEL PI IND STRL 8 (GLOVE) ×2 IMPLANT
GLOVE BIOGEL PI INDICATOR 8 (GLOVE) ×4
GOWN STRL REUS W/ TWL LRG LVL3 (GOWN DISPOSABLE) ×3 IMPLANT
GOWN STRL REUS W/TWL LRG LVL3 (GOWN DISPOSABLE) ×15
GUIDEROD T2 3X1000 (ROD) ×2 IMPLANT
K-WIRE  3.2X450M STR (WIRE) ×2
K-WIRE 3.2X450M STR (WIRE) ×1
KIT ROOM TURNOVER OR (KITS) ×3 IMPLANT
KWIRE 3.2X450M STR (WIRE) IMPLANT
MANIFOLD NEPTUNE II (INSTRUMENTS) ×1 IMPLANT
NAIL LONG GAMMA3 10X420X125 (Nail) ×2 IMPLANT
NS IRRIG 1000ML POUR BTL (IV SOLUTION) ×3 IMPLANT
PACK GENERAL/GYN (CUSTOM PROCEDURE TRAY) ×3 IMPLANT
PAD ARMBOARD 7.5X6 YLW CONV (MISCELLANEOUS) ×5 IMPLANT
PAD CAST 4YDX4 CTTN HI CHSV (CAST SUPPLIES) ×1 IMPLANT
PADDING CAST COTTON 4X4 STRL (CAST SUPPLIES) ×3
REAMER SHAFT BIXCUT (INSTRUMENTS) ×2 IMPLANT
SCREW LAG GAMMA 3 95MM (Screw) ×2 IMPLANT
SCREW LOCKING T2 F/T  5MMX45MM (Screw) ×2 IMPLANT
SCREW LOCKING T2 F/T 5MMX45MM (Screw) IMPLANT
SUT MNCRL AB 4-0 PS2 18 (SUTURE) ×2 IMPLANT
SUT MON AB 2-0 CT1 27 (SUTURE) ×3 IMPLANT
SUT VIC AB 0 CT1 27 (SUTURE) ×6
SUT VIC AB 0 CT1 27XBRD ANBCTR (SUTURE) ×2 IMPLANT
TOWEL OR 17X24 6PK STRL BLUE (TOWEL DISPOSABLE) ×3 IMPLANT
TOWEL OR 17X26 10 PK STRL BLUE (TOWEL DISPOSABLE) ×3 IMPLANT

## 2017-06-02 NOTE — Progress Notes (Signed)
Pt c/o soreness/discomfort at right elbow. Gerrianne ScaleH. Martensen PA at bedside & said it was probably due to the way RUE was positioned in OR.  He is also aware that pt's right & left feet are turned inwards. No new orders.

## 2017-06-02 NOTE — Transfer of Care (Signed)
Immediate Anesthesia Transfer of Care Note  Patient: [name]  Procedure(s) Performed: Procedure(s): INTRAMEDULLARY (IM) NAIL INTERTROCHANTRIC RIGHT HIP (Right)  Patient Location: PACU  Anesthesia Type:MAC and Spinal  Level of Consciousness: awake, alert , oriented and patient cooperative  Airway & Oxygen Therapy: Patient Spontanous Breathing and Patient connected to nasal cannula oxygen  Post-op Assessment: Report given to RN and Post -op Vital signs reviewed and stable  Post vital signs: Reviewed and stable  Last Vitals:  Vitals:   06/01/17 2317 06/02/17 0426  BP: (!) 146/73 136/65  Pulse: 90 85  Resp: 20 18  Temp: 37.7 C 37.4 C    Last Pain:  Vitals:   06/02/17 0952  TempSrc:   PainSc: 2          Complications: No apparent anesthesia complications

## 2017-06-02 NOTE — Op Note (Signed)
DATE OF SURGERY:  06/02/2017  TIME: 2:08 PM  PATIENT NAME:  [name]  AGE: 83 y.o.  PRE-OPERATIVE DIAGNOSIS:  RIGHT HIP FRACTURE  POST-OPERATIVE DIAGNOSIS:  SAME  PROCEDURE:  INTRAMEDULLARY (IM) NAIL INTERTROCHANTRIC RIGHT HIP  SURGEON:  MURPHY, TIMOTHY D  ASSISTANT:  Aquilla HackerHenry Martensen, PA-C, he was present and scrubbed throughout the case, critical for completion in a timely fashion, and for retraction, instrumentation, and closure.   OPERATIVE IMPLANTS: Stryker Gamma Nail  PREOPERATIVE INDICATIONS:  [name] is a 83 y.o. year old who fell and suffered a hip fracture. She was brought into the ER and then admitted and optimized and then elected for surgical intervention.    The risks benefits and alternatives were discussed with the patient including but not limited to the risks of nonoperative treatment, versus surgical intervention including infection, bleeding, nerve injury, malunion, nonunion, hardware prominence, hardware failure, need for hardware removal, blood clots, cardiopulmonary complications, morbidity, mortality, among others, and they were willing to proceed.    OPERATIVE PROCEDURE:  The patient was brought to the operating room and placed in the supine position. General anesthesia was administered. She was placed on the fracture table.  Closed reduction was performed under C-arm guidance. Time out was then performed after sterile prep and drape. She received preoperative antibiotics.  Incision was made proximal to the greater trochanter. A guidewire was placed in the appropriate position. Confirmation was made on AP and lateral views. The above-named nail was opened. I opened the proximal femur with a reamer. I then placed the nail by hand easily down. I did not need to ream the femur.  Once the nail was completely seated, I placed a guidepin into the femoral head into the center center position. I measured the length, and then reamed the lateral cortex and up  into the head. I then placed the lag screw. Slight compression was applied. Anatomic fixation achieved. Bone quality was mediocre.  I then secured the proximal interlocking bolt, and took off a half a turn, and then removed the instruments, and took final C-arm pictures AP and lateral the entire length of the leg.  I then used perfect circles technique to place a distal interlock screw.   Anatomic reconstruction was achieved, and the wounds were irrigated copiously and closed with Vicryl followed by staples and sterile gauze for the skin. The patient was awakened and returned to PACU in stable and satisfactory condition. There no complications and the patient tolerated the procedure well.  She will be weightbearing as tolerated, and will be on chemical px  for a period of four weeks after discharge.   Margarita Ranaimothy Murphy, M.D.

## 2017-06-02 NOTE — Addendum Note (Signed)
Addendum  created 06/02/17 1626 by Heather RobertsSinger, Adia Crammer, MD   Order list changed, Order sets accessed

## 2017-06-02 NOTE — Consult Note (Addendum)
ORTHOPAEDIC CONSULTATION  REQUESTING PHYSICIAN: Rolly Salter, MD  Chief Complaint: Right hip pain  Assessment / Plan: Principal Problem:   Closed right hip fracture, initial encounter [facility]) Active Problems:   Diabetes [facility])   Essential hypertension   Coronary artery disease   Complete heart block (HCC)   Microcytic anemia  Plan for Operative fixation this afternoon -right hip IM nail. -NPO  -Medicine team admit and pre-op clearance -PT/OT post op -Will ammend WB status postop, bedrest for now -Foley for comfort to be removed POD 1/2 -May require Rehab or SNF placement upon discharge.  Previously active and ambulatory without assistive devices.  The risks benefits and alternatives of the procedure were discussed with the patient including but not limited to infection, bleeding, nerve injury, the need for revision surgery, blood clots, cardiopulmonary complications, morbidity, mortality, among others.  The patient verbalizes understanding and wishes to proceed.    HPI: [name] is a 83 y.o. female who complains of right hip pain after mechanical fall yesterday while working in her garden. Presented to the ED via EMS where x-rays showed.  Comminuted and displaced intertrochanteric fracture of the proximal right femur.  Orthopedics was consulted for evaluation.  Past Medical History:  Diagnosis Date  . CAD (coronary artery disease)    a. s/p PCI to LAD '08, Severe 3V CAD by cath 03/27/12 --> CABG  . CHF (congestive heart failure) (HCC)   . Complete heart block (HCC)    a. s/p STJ dual chamber pacemaker  . Depression   . Hypertension   . Hypothyroidism   . Mitral regurgitation    moderate by cath, mild by echo 03/2012  . Osteoarthrosis, unspecified whether generalized or localized, unspecified site   . Paroxysmal atrial fibrillation (HCC)    a. identified s/p CABG b. OAC discontinued 02/2014 after no recurrence  . Personal history of other diseases of digestive  system   . Type II or unspecified type diabetes mellitus without mention of complication, not stated as uncontrolled   . Unspecified glaucoma(365.9)   . Unspecified hemorrhoids without mention of complication    Past Surgical History:  Procedure Laterality Date  . CHOLECYSTECTOMY  12/2006  . CORONARY ARTERY BYPASS GRAFT  04/03/2012   Procedure: CORONARY ARTERY BYPASS GRAFTING (CABG);  Surgeon: Kerin Perna, MD;  Location[facility] OR;  Service: Open Heart Surgery;  Laterality: N/A;  CABG x four;  using left internal mammary artery and bilateral greater saphenous veins  . ERCP  2008  . LEFT HEART CATHETERIZATION WITH CORONARY ANGIOGRAM N/A 03/27/2012   Procedure: LEFT HEART CATHETERIZATION WITH CORONARY ANGIOGRAM;  Surgeon: Herby Abraham, MD;  Location: [facility] CATH LAB;  Service: Cardiovascular;  Laterality: N/A;  . MITRAL VALVE REPAIR  04/03/2012   Procedure: MITRAL VALVE REPAIR (MVR);  Surgeon: Kerin Perna, MD;  Location: [facility] OR;  Service: Open Heart Surgery;  Laterality: N/A;  . PERMANENT PACEMAKER INSERTION N/A 02/26/2015   STJ Assurity dual chamber pacemaker implanted by Dr Johney Frame for symptomatic complete heart block   Social History   Social History  . Marital status: Widowed    Spouse name: N/A  . Number of children: N/A  . Years of education: N/A   Social History Main Topics  . Smoking status: Former Smoker    Types: Cigarettes    Quit date: 09/03/2004  . Smokeless tobacco: Never Used  . Alcohol use No  . Drug use: No  . Sexual activity: Not Asked  Other Topics Concern  . None   Social History Narrative  . None   Family History  Problem Relation Age of Onset  . Heart disease Mother   . Heart disease Father [phone]       CABG  . Arrhythmia Brother        slow HR off and on since his 6720s   Allergies  Allergen Reactions  . Lisinopril Anaphylaxis  . Quinine Other (See Comments)    Pt states heart attack-like symptoms  . Penicillins Swelling and Other (See Comments)     Patient states that she had swelling at the injection site after her last Penicillin shot but no swelling in the throat.  . Sulfonamide Derivatives Rash   Prior to Admission medications   Medication Sig Start Date End Date Taking? Authorizing Provider  amLODipine (NORVASC) 5 MG tablet Take 5 mg by mouth daily. 05/23/15  Yes [provider]  aspirin 81 MG tablet Take 1 tablet (81 mg total) by mouth daily. 03/10/15  Yes Seiler, Amber K, NP  brimonidine (ALPHAGAN) 0.15 % ophthalmic solution USE 1 DROP INTO LEFT EYE TWICE A DAY 12/22/15  Yes [provider]  carvedilol (COREG) 6.25 MG tablet TAKE ONE TABLET BY MOUTH TWICE DAILY 08/09/16  Yes Branch, Dorothe PeaJonathan F, MD  FLUoxetine (PROZAC) 10 MG tablet Take 10 mg by mouth daily.   Yes [provider]  INVOKANA 300 MG TABS tablet Take 300 mg by mouth daily. 01/28/16  Yes [provider]  latanoprost (XALATAN) 0.005 % ophthalmic solution INSTILL 1 DROP IN LEFT EYE NIGHTLY 02/13/16  Yes [provider]  metFORMIN (GLUCOPHAGE) 500 MG tablet Take 500 mg by mouth daily with breakfast.  11/28/15  Yes [provider]  omeprazole (PRILOSEC) 20 MG capsule  12/11/10  Yes [provider]   Dg Chest 1 View  Result Date: 06/01/2017 CLINICAL DATA:  Fall with right hip pain EXAM: CHEST 1 VIEW COMPARISON:  02/27/2015 FINDINGS: Chronic cardiomegaly and aortic tortuosity. Status post CABG and mitral valve replacement. Stable dual-chamber pacer leads from the left. There is no edema, consolidation, effusion, or pneumothorax. Chronic deformity of the lateral left chest wall. No acute fracture noted. IMPRESSION: Stable from prior.  No evidence of acute disease. Electronically Signed   By: Marnee SpringJonathon  Watts M.D.   On: 06/01/2017 17:14   Dg Hip Unilat With Pelvis 2-3 Views Right  Result Date: 06/01/2017 CLINICAL DATA:  Right hip pain after fall at home today. EXAM: DG HIP (WITH OR WITHOUT PELVIS) 2-3V RIGHT COMPARISON:  None.  FINDINGS: Comminuted and displaced fracture is seen involving the intertrochanteric region of the proximal right femur. Left hip appears normal. IMPRESSION: Comminuted and displaced intertrochanteric fracture of the proximal right femur. Electronically Signed   By: Lupita RaiderJames  Green Jr, M.D.   On: 06/01/2017 17:15    Positive ROS: All other systems have been reviewed and were otherwise negative with the exception of those mentioned in the HPI and as above.  Objective: Labs cbc  Recent Labs  06/01/17 1631  WBC 8.2  HGB 11.1*  HCT 35.8*  PLT 235    Labs coag  Recent Labs  06/01/17 1631  INR 0.96     Recent Labs  06/01/17 1631  NA 142  K 3.5  CL 107  CO2 26  GLUCOSE 138*  BUN 17  CREATININE 0.89  CALCIUM 8.9    Physical Exam: Vitals:   06/01/17 2317 06/02/17 0426  BP: (!) 146/73 136/65  Pulse:  90 85  Resp: 20 18  Temp: 99.9 F (37.7 C) 99.3 F (37.4 C)   General: Alert, no acute distress. Supine in bed. Mental status: Alert and Oriented x3 Neurologic: Speech Clear and organized, no gross focal findings or movement disorder appreciated. Respiratory: No cyanosis, no use of accessory musculature Cardiovascular: No pedal edema GI: Abdomen is soft and non-tender, non-distended. Skin: Warm and dry.  No lesions in the area of chief complaint . Extremities: Warm and well perfused w/o edema Psychiatric: Patient is competent for consent with normal mood and affect  MUSCULOSKELETAL:  Right leg pain with range of motion at the hip. Shortened and externally rotated. Feet warm. Neurovascular intact distally. EHL FHL dorsiflexion plantar flexion intact. Other extremities are atraumatic with painless ROM and NVI.   Albina Billet III PA-C 06/02/2017 7:34 AM

## 2017-06-02 NOTE — Anesthesia Procedure Notes (Signed)
Spinal  Patient location during procedure: OR Start time: 06/02/2017 1:11 PM End time: 06/02/2017 1:20 PM Staffing Anesthesiologist: Heather RobertsSINGER, Tikesha Mort Performed: anesthesiologist  Preanesthetic Checklist Completed: patient identified, surgical consent, pre-op evaluation, timeout performed, IV checked, risks and benefits discussed and monitors and equipment checked Spinal Block Patient position: sitting Prep: DuraPrep Patient monitoring: cardiac monitor, continuous pulse ox and blood pressure Approach: right paramedian Location: L2-3 Injection technique: single-shot Needle Needle type: Quincke  Needle gauge: 24 G Needle length: 9 cm Additional Notes Functioning IV was confirmed and monitors were applied. Sterile prep and drape, including hand hygiene and sterile gloves were used. The patient was positioned and the spine was prepped. The skin was anesthetized with lidocaine.  Free flow of clear CSF was obtained prior to injecting local anesthetic into the CSF.  The spinal needle aspirated freely following injection.  The needle was carefully withdrawn.  The patient tolerated the procedure well.

## 2017-06-02 NOTE — Progress Notes (Signed)
Initial Nutrition Assessment  DOCUMENTATION CODES:   Not applicable  INTERVENTION:  Once diet advances, provide Glucerna Shake po BID, each supplement provides 220 kcal and 10 grams of protein.  NUTRITION DIAGNOSIS:   Increased nutrient needs related to  (post op healing) as evidenced by estimated needs.  GOAL:   Patient will meet greater than or equal to 90% of their needs  MONITOR:   Supplement acceptance, Labs, Weight trends, Diet advancement, Skin, I & O's  REASON FOR ASSESSMENT:   Consult Hip fracture protocol  ASSESSMENT:   83 y.o. female with medical history significant for hypertension, type 2 diabetes mellitus, and coronary artery disease status post CABG in 2013, now presenting to the emergency department with severe right hip pain after a fall at home.  Radiographs of the hip demonstrated a comminuted and displaced intertrochanteric fracture of the right proximal femur.  Pt is currently in OR. RD unable to obtain nutrition history, however per MD note, pt was un usual state of health and was having a great day prior to fall and admission. Pt with no weight loss per weight records. RD to order nutritional supplements once diet advances to aid in post op healing.   Unable to complete Nutrition-Focused physical exam at this time.   Labs and medications reviewed.   Diet Order:  Diet NPO time specified Except for: Ice Chips Diet NPO time specified Except for: Sips with Meds  Skin:   (Incision R thigh)  Last BM:  6/6  Height:   Ht Readings from Last 1 Encounters:  06/01/17 5\' 7"  (1.702 m)    Weight:   Wt Readings from Last 1 Encounters:  06/01/17 148 lb (67.1 kg)    Ideal Body Weight:  61.36 kg  BMI:  Body mass index is 23.18 kg/m.  Estimated Nutritional Needs:   Kcal:  1650-1850  Protein:  75-85 grams  Fluid:  1.6 - 1 .8 L/day  EDUCATION NEEDS:   No education needs identified at this time  Roslyn SmilingStephanie Wm Fruchter, MS, RD, LDN Pager #  [phone] After hours/ weekend pager # [phone]

## 2017-06-02 NOTE — H&P (View-Only) (Signed)
ORTHOPAEDIC CONSULTATION  REQUESTING PHYSICIAN: Rolly Salter, MD  Chief Complaint: Right hip pain  Assessment / Plan: Principal Problem:   Closed right hip fracture, initial encounter [facility]) Active Problems:   Diabetes [facility])   Essential hypertension   Coronary artery disease   Complete heart block (HCC)   Microcytic anemia  Plan for Operative fixation this afternoon -right hip IM nail. -NPO  -Medicine team admit and pre-op clearance -PT/OT post op -Will ammend WB status postop, bedrest for now -Foley for comfort to be removed POD 1/2 -May require Rehab or SNF placement upon discharge.  Previously active and ambulatory without assistive devices.  The risks benefits and alternatives of the procedure were discussed with the patient including but not limited to infection, bleeding, nerve injury, the need for revision surgery, blood clots, cardiopulmonary complications, morbidity, mortality, among others.  The patient verbalizes understanding and wishes to proceed.    HPI: [name] is a 83 y.o. female who complains of right hip pain after mechanical fall yesterday while working in her garden. Presented to the ED via EMS where x-rays showed.  Comminuted and displaced intertrochanteric fracture of the proximal right femur.  Orthopedics was consulted for evaluation.  Past Medical History:  Diagnosis Date  . CAD (coronary artery disease)    a. s/p PCI to LAD '08, Severe 3V CAD by cath 03/27/12 --> CABG  . CHF (congestive heart failure) (HCC)   . Complete heart block (HCC)    a. s/p STJ dual chamber pacemaker  . Depression   . Hypertension   . Hypothyroidism   . Mitral regurgitation    moderate by cath, mild by echo 03/2012  . Osteoarthrosis, unspecified whether generalized or localized, unspecified site   . Paroxysmal atrial fibrillation (HCC)    a. identified s/p CABG b. OAC discontinued 02/2014 after no recurrence  . Personal history of other diseases of digestive  system   . Type II or unspecified type diabetes mellitus without mention of complication, not stated as uncontrolled   . Unspecified glaucoma(365.9)   . Unspecified hemorrhoids without mention of complication    Past Surgical History:  Procedure Laterality Date  . CHOLECYSTECTOMY  12/2006  . CORONARY ARTERY BYPASS GRAFT  04/03/2012   Procedure: CORONARY ARTERY BYPASS GRAFTING (CABG);  Surgeon: Kerin Perna, MD;  Location[facility] OR;  Service: Open Heart Surgery;  Laterality: N/A;  CABG x four;  using left internal mammary artery and bilateral greater saphenous veins  . ERCP  2008  . LEFT HEART CATHETERIZATION WITH CORONARY ANGIOGRAM N/A 03/27/2012   Procedure: LEFT HEART CATHETERIZATION WITH CORONARY ANGIOGRAM;  Surgeon: Herby Abraham, MD;  Location: [facility] CATH LAB;  Service: Cardiovascular;  Laterality: N/A;  . MITRAL VALVE REPAIR  04/03/2012   Procedure: MITRAL VALVE REPAIR (MVR);  Surgeon: Kerin Perna, MD;  Location: [facility] OR;  Service: Open Heart Surgery;  Laterality: N/A;  . PERMANENT PACEMAKER INSERTION N/A 02/26/2015   STJ Assurity dual chamber pacemaker implanted by Dr Johney Frame for symptomatic complete heart block   Social History   Social History  . Marital status: Widowed    Spouse name: N/A  . Number of children: N/A  . Years of education: N/A   Social History Main Topics  . Smoking status: Former Smoker    Types: Cigarettes    Quit date: 09/03/2004  . Smokeless tobacco: Never Used  . Alcohol use No  . Drug use: No  . Sexual activity: Not Asked  Other Topics Concern  . None   Social History Narrative  . None   Family History  Problem Relation Age of Onset  . Heart disease Mother   . Heart disease Father 4285       CABG  . Arrhythmia Brother        slow HR off and on since his 6720s   Allergies  Allergen Reactions  . Lisinopril Anaphylaxis  . Quinine Other (See Comments)    Pt states heart attack-like symptoms  . Penicillins Swelling and Other (See Comments)     Patient states that she had swelling at the injection site after her last Penicillin shot but no swelling in the throat.  . Sulfonamide Derivatives Rash   Prior to Admission medications   Medication Sig Start Date End Date Taking? Authorizing Provider  amLODipine (NORVASC) 5 MG tablet Take 5 mg by mouth daily. 05/23/15  Yes [provider]  aspirin 81 MG tablet Take 1 tablet (81 mg total) by mouth daily. 03/10/15  Yes Seiler, Amber K, NP  brimonidine (ALPHAGAN) 0.15 % ophthalmic solution USE 1 DROP INTO LEFT EYE TWICE A DAY 12/22/15  Yes [provider]  carvedilol (COREG) 6.25 MG tablet TAKE ONE TABLET BY MOUTH TWICE DAILY 08/09/16  Yes Branch, Dorothe PeaJonathan F, MD  FLUoxetine (PROZAC) 10 MG tablet Take 10 mg by mouth daily.   Yes [provider]  INVOKANA 300 MG TABS tablet Take 300 mg by mouth daily. 01/28/16  Yes [provider]  latanoprost (XALATAN) 0.005 % ophthalmic solution INSTILL 1 DROP IN LEFT EYE NIGHTLY 02/13/16  Yes [provider]  metFORMIN (GLUCOPHAGE) 500 MG tablet Take 500 mg by mouth daily with breakfast.  11/28/15  Yes [provider]  omeprazole (PRILOSEC) 20 MG capsule  12/11/10  Yes [provider]   Dg Chest 1 View  Result Date: 06/01/2017 CLINICAL DATA:  Fall with right hip pain EXAM: CHEST 1 VIEW COMPARISON:  02/27/2015 FINDINGS: Chronic cardiomegaly and aortic tortuosity. Status post CABG and mitral valve replacement. Stable dual-chamber pacer leads from the left. There is no edema, consolidation, effusion, or pneumothorax. Chronic deformity of the lateral left chest wall. No acute fracture noted. IMPRESSION: Stable from prior.  No evidence of acute disease. Electronically Signed   By: Marnee SpringJonathon  Watts M.D.   On: 06/01/2017 17:14   Dg Hip Unilat With Pelvis 2-3 Views Right  Result Date: 06/01/2017 CLINICAL DATA:  Right hip pain after fall at home today. EXAM: DG HIP (WITH OR WITHOUT PELVIS) 2-3V RIGHT COMPARISON:  None.  FINDINGS: Comminuted and displaced fracture is seen involving the intertrochanteric region of the proximal right femur. Left hip appears normal. IMPRESSION: Comminuted and displaced intertrochanteric fracture of the proximal right femur. Electronically Signed   By: Lupita RaiderJames  Green Jr, M.D.   On: 06/01/2017 17:15    Positive ROS: All other systems have been reviewed and were otherwise negative with the exception of those mentioned in the HPI and as above.  Objective: Labs cbc  Recent Labs  06/01/17 1631  WBC 8.2  HGB 11.1*  HCT 35.8*  PLT 235    Labs coag  Recent Labs  06/01/17 1631  INR 0.96     Recent Labs  06/01/17 1631  NA 142  K 3.5  CL 107  CO2 26  GLUCOSE 138*  BUN 17  CREATININE 0.89  CALCIUM 8.9    Physical Exam: Vitals:   06/01/17 2317 06/02/17 0426  BP: (!) 146/73 136/65  Pulse:  90 85  Resp: 20 18  Temp: 99.9 F (37.7 C) 99.3 F (37.4 C)   General: Alert, no acute distress. Supine in bed. Mental status: Alert and Oriented x3 Neurologic: Speech Clear and organized, no gross focal findings or movement disorder appreciated. Respiratory: No cyanosis, no use of accessory musculature Cardiovascular: No pedal edema GI: Abdomen is soft and non-tender, non-distended. Skin: Warm and dry.  No lesions in the area of chief complaint . Extremities: Warm and well perfused w/o edema Psychiatric: Patient is competent for consent with normal mood and affect  MUSCULOSKELETAL:  Right leg pain with range of motion at the hip. Shortened and externally rotated. Feet warm. Neurovascular intact distally. EHL FHL dorsiflexion plantar flexion intact. Other extremities are atraumatic with painless ROM and NVI.   Albina Billet III PA-C 06/02/2017 7:34 AM

## 2017-06-02 NOTE — Interval H&P Note (Signed)
History and Physical Interval Note:  06/02/2017 11:07 AM  [name]  has presented today for surgery, with the diagnosis of RIGHT HIP FRACTURE  The various methods of treatment have been discussed with the patient and family. After consideration of risks, benefits and other options for treatment, the patient has consented to  Procedure(s): INTRAMEDULLARY (IM) NAIL INTERTROCHANTRIC RIGHT HIP (Right) as a surgical intervention .  The patient's history has been reviewed, patient examined, no change in status, stable for surgery.  I have reviewed the patient's chart and labs.  Questions were answered to the patient's satisfaction.     Tresa Jolley D

## 2017-06-02 NOTE — Plan of Care (Signed)
Problem: Pain Managment: Goal: General experience of comfort will improve Pain scale explained to patient. Patient voices understanding.

## 2017-06-02 NOTE — Anesthesia Preprocedure Evaluation (Addendum)
Anesthesia Evaluation  Patient identified by MRN, date of birth, ID band Patient awake    Reviewed: Allergy & Precautions, NPO status , Patient's Chart, lab work & pertinent test results  History of Anesthesia Complications Negative for: history of anesthetic complications  Airway Mallampati: I  TM Distance: >3 FB Neck ROM: Full    Dental  (+) Edentulous Upper, Edentulous Lower, Dental Advisory Given   Pulmonary former smoker,    Pulmonary exam normal        Cardiovascular hypertension, + CAD and + CABG  Normal cardiovascular exam+ pacemaker      Neuro/Psych PSYCHIATRIC DISORDERS Depression    GI/Hepatic negative GI ROS, Neg liver ROS,   Endo/Other  diabetes  Renal/GU negative Renal ROS     Musculoskeletal   Abdominal   Peds  Hematology   Anesthesia Other Findings   Reproductive/Obstetrics                            Anesthesia Physical Anesthesia Plan  ASA: III  Anesthesia Plan: Spinal   Post-op Pain Management:    Induction:   PONV Risk Score and Plan: 2 and Ondansetron and Dexamethasone  Airway Management Planned: Simple Face Mask  Additional Equipment:   Intra-op Plan:   Post-operative Plan:   Informed Consent: I have reviewed the patients History and Physical, chart, labs and discussed the procedure including the risks, benefits and alternatives for the proposed anesthesia with the patient or authorized representative who has indicated his/her understanding and acceptance.   Dental advisory given  Plan Discussed with: Anesthesiologist and Surgeon  Anesthesia Plan Comments:        Anesthesia Quick Evaluation

## 2017-06-02 NOTE — Anesthesia Postprocedure Evaluation (Signed)
Anesthesia Post Note  Patient: [name] P Moroni  Procedure(s) Performed: Procedure(s) (LRB): INTRAMEDULLARY (IM) NAIL INTERTROCHANTRIC RIGHT HIP (Right)     Patient location during evaluation: PACU Anesthesia Type: Spinal Level of consciousness: awake and alert Pain management: pain level controlled Vital Signs Assessment: post-procedure vital signs reviewed and stable Respiratory status: spontaneous breathing and respiratory function stable Cardiovascular status: blood pressure returned to baseline and stable Postop Assessment: spinal receding Anesthetic complications: no    Last Vitals:  Vitals:   06/02/17 1530 06/02/17 1540  BP:  (!) 118/58  Pulse: 79 78  Resp: 16 (!) 22  Temp:      Last Pain:  Vitals:   06/02/17 1540  TempSrc:   PainSc: 0-No pain    LLE Motor Response: Purposeful movement;Responds to commands (06/02/17 1540) LLE Sensation: Tingling (06/02/17 1540) RLE Motor Response: Purposeful movement;Responds to commands (06/02/17 1540) RLE Sensation: Tingling (06/02/17 1540) L Sensory Level: S1-Sole of foot, small toes (06/02/17 1540) R Sensory Level: S1-Sole of foot, small toes (06/02/17 1540)  Kenyetta Fife DANIEL

## 2017-06-02 NOTE — Progress Notes (Signed)
SLP Cancellation Note  Patient Details Name: [name] P Nowak MRN: [id number] DOB: May 24, 1932   Cancelled treatment:        Pt NPO for surgery this afternoon. Will defer swallow assessment until tomorrow   Royce MacadamiaLitaker, Rubina Basinski Willis 06/02/2017, 9:00 AM    Breck CoonsLisa Willis Lonell FaceLitaker M.Ed [employer]-SLP Pager [phone]

## 2017-06-02 NOTE — Progress Notes (Signed)
[facility] Progress Note  Patient: [name] ZOX:[id number]   PCP: [name], MD DOB: March 28, 1932   DOA: 06/01/2017   DOS: 06/02/2017   Date of Service: the patient was seen and examined on 06/02/2017  Subjective: Pain is well-controlled. No dizziness no lightheadedness no headache, neck pain. No focal deficit. No diarrhea no constipation. No abdominal pain no chest pain or shortness of breath.  Brief hospital course: Pt. with PMH of type II DM, hypertension, CAD S/P CABG; admitted on 06/01/2017, presented with complaint of fall mechanical, was found to have right hip fracture. Currently further plan is follow-up postoperative course.  Assessment and Plan: 1. Right hip fracture - Pt presents with severe right hip pain, unable to bear weight, after a mechanical fall at home  - Radiographs demonstrated right intertrochanteric femur fracture  - Orthopedic surgery is consulting and much appreciated  - Based on the available data, [name] presents an estimated 0.9% risk probability for perioperative MI or cardiac arrest per Nolon Nations al  - Scheduled for surgery this afternoon. We'll monitor postoperative recovery. - VTE ppx per the surgical team   2. CAD - Pt is s/p CABG in 2013 with no angina since  - She is managed medically with ASA 81 and Coreg  3. Hypertension - BP at goal  - Continue Coreg, Norvasc    4. Type II DM - A1c was 7.7% in 2016  - Managed at home with Invokana and metformin  - Check CBG q4h while NPO and start a moderate-intensity SSI   5. Microcytic anemia  - Hgb is stable, continue to monitor. - Denies melena or hematochezia; no bleeding evident   6. Complete heart block  - PPM is present and pacing    Diet: Cardiac diet DVT Prophylaxis: subcutaneous Heparin  Advance goals of care discussion: Full code  Family Communication: no family was present at bedside, at the time of interview.  Disposition:  Discharge to SNF  likely.  Consultants: Orthopedics Procedures: None  Antibiotics: Anti-infectives    Start     Dose/Rate Route Frequency Ordered Stop   06/02/17 1900  ceFAZolin (ANCEF) IVPB 2g/100 mL premix     2 g 200 mL/hr over 30 Minutes Intravenous Every 6 hours 06/02/17 1712 06/03/17 0659   06/02/17 1100  ceFAZolin (ANCEF) IVPB 2g/100 mL premix  Status:  Discontinued     2 g 200 mL/hr over 30 Minutes Intravenous On call to O.R. 06/02/17 1047 06/02/17 1706       Objective: Physical Exam: Vitals:   06/02/17 1630 06/02/17 1641 06/02/17 1656 06/02/17 1700  BP:  (!) 103/51 (!) 113/54 (!) 109/52  Pulse: 84 81 82 82  Resp: 18 15 18 18   Temp:   97.5 F (36.4 C) 99.1 F (37.3 C)  TempSrc:    Oral  SpO2: 95% 94% 95% 94%  Weight:      Height:        Intake/Output Summary (Last 24 hours) at 06/02/17 1749 Last data filed at 06/02/17 1700  Gross per 24 hour  Intake           2922.5 ml  Output              950 ml  Net           1972.5 ml   Filed Weights   06/01/17 1614  Weight: 67.1 kg (148 lb)   General: Alert, Awake and Oriented to Time, Place and Person. Appear in mild distress, affect  appropriate Eyes: PERRL, Conjunctiva normal ENT: Oral Mucosa clear moist. Neck: difficult to assess JVD, no Abnormal Mass Or lumps Cardiovascular: S1 and S2 Present, no Murmur, Peripheral Pulses Present Respiratory: normal respiratory effort, Bilateral Air entry equal and Decreased, no use of accessory muscle, Clear to Auscultation, no Crackles, no wheezes Abdomen: Bowel Sound present, Soft and no tenderness, no hernia Skin: no redness, no Rash, no induration Extremities: no Pedal edema, no calf tenderness Neurologic: Grossly no focal neuro deficit. Bilaterally Equal motor strength  Data Reviewed: CBC:  Recent Labs Lab 06/01/17 1631  WBC 8.2  NEUTROABS 6.6  HGB 11.1*  HCT 35.8*  MCV 76.2*  PLT 235   Basic Metabolic Panel:  Recent Labs Lab 06/01/17 1631  NA 142  K 3.5  CL 107  CO2 26   GLUCOSE 138*  BUN 17  CREATININE 0.89  CALCIUM 8.9    Liver Function Tests: No results for input(s): AST, ALT, ALKPHOS, BILITOT, PROT, ALBUMIN in the last 168 hours. No results for input(s): LIPASE, AMYLASE in the last 168 hours. No results for input(s): AMMONIA in the last 168 hours. Coagulation Profile:  Recent Labs Lab 06/01/17 1631  INR 0.96   Cardiac Enzymes: No results for input(s): CKTOTAL, CKMB, CKMBINDEX, TROPONINI in the last 168 hours. BNP (last 3 results) No results for input(s): PROBNP in the last 8760 hours. CBG:  Recent Labs Lab 06/01/17 2347 06/02/17 0423 06/02/17 1136 06/02/17 1622  GLUCAP 167* 138* 93 109*   Studies: Pelvis Portable  Result Date: 06/02/2017 CLINICAL DATA:  Right femur fracture EXAM: PORTABLE PELVIS 1-2 VIEWS COMPARISON:  06/02/2017, 06/01/2017 FINDINGS: Calcified pelvic phleboliths. Symmetric SI joints. Pubic symphysis intact. Partially visualized intramedullary rod in the proximal right femur across partially visualized intertrochanteric fracture. IMPRESSION: Partially visualized surgical rod fixation of the incompletely visualized right intertrochanteric fracture. Electronically Signed   By: Jasmine PangKim  Fujinaga M.D.   On: 06/02/2017 15:31   Dg C-arm 1-60 Min  Result Date: 06/02/2017 CLINICAL DATA:  Intramedullary nail placement for right proximal femur fracture EXAM: DG C-ARM 61-120 MIN; RIGHT FEMUR 2 VIEWS COMPARISON:  Pelvis and right hip radiographs June 01, 2017 FLUOROSCOPY TIME:  0 minutes 50 second; 4 acquired images FINDINGS: Frontal and lateral views were obtained. There is screw and nail fixation through a comminuted intertrochanteric femur fracture. Alignment fracture site is near anatomic. The tip screws in proximal femoral head. Nail is positioned in the medullary portion of the femur. No new fracture. No dislocation. There is slight narrowing in the right hip joint region. IMPRESSION: Screw and nail fixation through proximal  intertrochanteric femur fracture on the right with alignment anatomic. No new fracture. No dislocation. Tip of screw in proximal femoral head. Electronically Signed   By: Bretta BangWilliam  Woodruff III M.D.   On: 06/02/2017 14:38   Dg Femur, Min 2 Views Right  Result Date: 06/02/2017 CLINICAL DATA:  Intramedullary nail placement for right proximal femur fracture EXAM: DG C-ARM 61-120 MIN; RIGHT FEMUR 2 VIEWS COMPARISON:  Pelvis and right hip radiographs June 01, 2017 FLUOROSCOPY TIME:  0 minutes 50 second; 4 acquired images FINDINGS: Frontal and lateral views were obtained. There is screw and nail fixation through a comminuted intertrochanteric femur fracture. Alignment fracture site is near anatomic. The tip screws in proximal femoral head. Nail is positioned in the medullary portion of the femur. No new fracture. No dislocation. There is slight narrowing in the right hip joint region. IMPRESSION: Screw and nail fixation through proximal intertrochanteric femur fracture on the  right with alignment anatomic. No new fracture. No dislocation. Tip of screw in proximal femoral head. Electronically Signed   By: Bretta Bang III M.D.   On: 06/02/2017 14:38    Scheduled Meds: . amLODipine  5 mg Oral Daily  . [START ON 06/03/2017] aspirin EC  325 mg Oral Q breakfast  . brimonidine  1 drop Left Eye BID  . carvedilol  6.25 mg Oral BID WC  . docusate sodium  100 mg Oral BID  . [START ON 06/03/2017] feeding supplement (GLUCERNA SHAKE)  237 mL Oral BID BM  . FLUoxetine  10 mg Oral Daily  . HYDROcodone-acetaminophen      . HYDROmorphone      . insulin aspart  0-15 Units Subcutaneous Q4H  . latanoprost  1 drop Left Eye QHS  . pantoprazole  40 mg Oral Daily  . senna  1 tablet Oral BID   Continuous Infusions: . sodium chloride    .  ceFAZolin (ANCEF) IV     PRN Meds: bisacodyl, HYDROcodone-acetaminophen, labetalol, magnesium citrate, menthol-cetylpyridinium **OR** phenol, morphine injection, polyethylene  glycol  Time spent: 35 minutes  Author: Lynden Oxford, MD [facility] Pager: [phone] 06/02/2017 5:49 PM  If 7PM-7AM, please contact night-coverage at [email or web address], password [facility]

## 2017-06-03 ENCOUNTER — Encounter (HOSPITAL_COMMUNITY): Payer: Self-pay | Admitting: Orthopedic Surgery

## 2017-06-03 LAB — HEMOGLOBIN A1C
HEMOGLOBIN A1C: 8 % — AB (ref 4.8–5.6)
MEAN PLASMA GLUCOSE: 183 mg/dL

## 2017-06-03 LAB — CBC
HCT: 28.1 % — ABNORMAL LOW (ref 36.0–46.0)
Hemoglobin: 8.3 g/dL — ABNORMAL LOW (ref 12.0–15.0)
MCH: 24 pg — ABNORMAL LOW (ref 26.0–34.0)
MCHC: 29.5 g/dL — ABNORMAL LOW (ref 30.0–36.0)
MCV: 81.2 fL (ref 78.0–100.0)
PLATELETS: 138 10*3/uL — AB (ref 150–400)
RBC: 3.46 MIL/uL — AB (ref 3.87–5.11)
RDW: 16.8 % — ABNORMAL HIGH (ref 11.5–15.5)
WBC: 8.8 10*3/uL (ref 4.0–10.5)

## 2017-06-03 LAB — BASIC METABOLIC PANEL WITH GFR
Anion gap: 12 (ref 5–15)
BUN: 14 mg/dL (ref 6–20)
CO2: 19 mmol/L — ABNORMAL LOW (ref 22–32)
Calcium: 8.3 mg/dL — ABNORMAL LOW (ref 8.9–10.3)
Chloride: 106 mmol/L (ref 101–111)
Creatinine, Ser: 0.89 mg/dL (ref 0.44–1.00)
GFR calc Af Amer: >60 mL/min
GFR calc non Af Amer: 58 mL/min — ABNORMAL LOW
Glucose, Bld: 122 mg/dL — ABNORMAL HIGH (ref 65–99)
Potassium: 3.7 mmol/L (ref 3.5–5.1)
Sodium: 137 mmol/L (ref 135–145)

## 2017-06-03 LAB — GLUCOSE, CAPILLARY
GLUCOSE-CAPILLARY: 259 mg/dL — AB (ref 65–99)
GLUCOSE-CAPILLARY: 192 mg/dL — AB (ref 65–99)
Glucose-Capillary: 121 mg/dL — ABNORMAL HIGH (ref 65–99)
Glucose-Capillary: 122 mg/dL — ABNORMAL HIGH (ref 65–99)
Glucose-Capillary: 123 mg/dL — ABNORMAL HIGH (ref 65–99)
Glucose-Capillary: 130 mg/dL — ABNORMAL HIGH (ref 65–99)
Glucose-Capillary: 229 mg/dL — ABNORMAL HIGH (ref 65–99)

## 2017-06-03 LAB — MAGNESIUM: MAGNESIUM: 1.8 mg/dL (ref 1.7–2.4)

## 2017-06-03 NOTE — [place] FL2 (Signed)
[place] MEDICAID FL2 LEVEL OF CARE SCREENING TOOL     IDENTIFICATION  Patient Name: [name] Birthdate: 1932-08-06 Sex: female Admission Date (Current Location): 06/01/2017  [facility] and [place] Number:  [employer] and Address:  [facility], [address] [place], [place] [address]      Provider Number: [id number]  Attending Physician Name and Address:  Rolly Salter, MD  Relative Name and Phone Number:       Current Level of Care: Hospital Recommended Level of Care: Skilled Nursing Facility Prior Approval Number:    Date Approved/Denied: 06/02/17 PASRR Number: [id number]  Discharge Plan: SNF    Current Diagnoses: Patient Active Problem List   Diagnosis Date Noted  . Microcytic anemia 06/01/2017  . Closed right hip fracture, initial encounter (HCC) 06/01/2017  . Pacemaker 05/30/2015  . Complete heart block (HCC) 02/24/2015  . Encounter for therapeutic drug monitoring 02/04/2014  . History of MI (myocardial infarction) 07/17/2012  . Hyperlipidemia LDL goal < 70 07/17/2012  . S/P CABG x 4 04/14/2012  . S/P MVR (mitral valve repair) 04/13/2012  . Ejection fraction < 50% 04/13/2012  . Medication intolerance 04/13/2012  . Coronary artery disease 03/28/2012  . Diabetes (HCC) 04/24/2009  . GLAUCOMA 04/24/2009  . Essential hypertension 04/24/2009  . HEMORRHOIDS 04/24/2009  . DEGENERATIVE JOINT DISEASE 04/24/2009  . DIVERTICULITIS, HX OF 04/24/2009    Orientation RESPIRATION BLADDER Height & Weight     Self, Time, Situation, Place  O2 (2L [place]) External catheter, Continent Weight: 148 lb (67.1 kg) Height:  5\' 7"  (170.2 cm)  BEHAVIORAL SYMPTOMS/MOOD NEUROLOGICAL BOWEL NUTRITION STATUS      Continent Diet  AMBULATORY STATUS COMMUNICATION OF NEEDS Skin   Extensive Assist Verbally Surgical wounds                       Personal Care Assistance Level of Assistance  Bathing, Feeding, Dressing Bathing Assistance:  Maximum assistance   Dressing Assistance: Maximum assistance     Functional Limitations Info  Sight, Hearing, Speech Sight Info: Impaired Hearing Info: Impaired Speech Info: Adequate    SPECIAL CARE FACTORS FREQUENCY  PT (By licensed PT), OT (By licensed OT)     PT Frequency: 5/wk OT Frequency: 5/wk            Contractures      Additional Factors Info  Code Status, Allergies Code Status Info: Full Allergies Info: LISINOPRIL, QUININE, PENICILLINS, SULFONAMIDE DERIVATIVES            Current Medications (06/03/2017):  This is the current hospital active medication list Current Facility-Administered Medications  Medication Dose Route Frequency Provider Last Rate Last Dose  . 0.9 %  sodium chloride infusion   Intravenous Continuous Albina Billet III, PA-C 100 mL/hr at 06/03/17 7846    . amLODipine (NORVASC) tablet 5 mg  5 mg Oral Daily Opyd, Lavone Neri, MD      . aspirin EC tablet 325 mg  325 mg Oral Q breakfast Albina Billet III, PA-C   325 mg at 06/03/17 0813  . bisacodyl (DULCOLAX) EC tablet 5 mg  5 mg Oral Daily PRN Opyd, Lavone Neri, MD      . brimonidine (ALPHAGAN) 0.15 % ophthalmic solution 1 drop  1 drop Left Eye BID Opyd, Lavone Neri, MD   1 drop at 06/03/17 0814  . carvedilol (COREG) tablet 6.25 mg  6.25 mg Oral BID WC Opyd, Lavone Neri, MD  6.25 mg at 06/03/17 0813  . docusate sodium (COLACE) capsule 100 mg  100 mg Oral BID Albina BilletMartensen, Henry Calvin III, PA-C   100 mg at 06/03/17 0813  . feeding supplement (GLUCERNA SHAKE) (GLUCERNA SHAKE) liquid 237 mL  237 mL Oral BID BM Rolly SalterPatel, Pranav M, MD   237 mL at 06/03/17 1320  . FLUoxetine (PROZAC) capsule 10 mg  10 mg Oral Daily Opyd, Lavone Neriimothy S, MD   10 mg at 06/03/17 0814  . HYDROcodone-acetaminophen (NORCO/VICODIN) 5-325 MG per tablet 1-2 tablet  1-2 tablet Oral Q6H PRN Opyd, Lavone Neriimothy S, MD   1 tablet at 06/03/17 0826  . insulin aspart (novoLOG) injection 0-15 Units  0-15 Units Subcutaneous Q4H Opyd, Lavone Neriimothy S,  MD   5 Units at 06/03/17 1210  . labetalol (NORMODYNE,TRANDATE) injection 5-10 mg  5-10 mg Intravenous Q2H PRN Opyd, Lavone Neriimothy S, MD      . latanoprost (XALATAN) 0.005 % ophthalmic solution 1 drop  1 drop Left Eye QHS Opyd, Lavone Neriimothy S, MD   1 drop at 06/02/17 2121  . magnesium citrate solution 1 Bottle  1 Bottle Oral Once PRN Albina BilletMartensen, Henry Calvin III, PA-C      . menthol-cetylpyridinium (CEPACOL) lozenge 3 mg  1 lozenge Oral PRN Albina BilletMartensen, Henry Calvin III, PA-C       Or  . phenol (CHLORASEPTIC) mouth spray 1 spray  1 spray Mouth/Throat PRN Albina BilletMartensen, Henry Calvin III, PA-C      . morphine 4 MG/ML injection 2-4 mg  2-4 mg Intravenous Q4H PRN Rolly SalterPatel, Pranav M, MD      . pantoprazole (PROTONIX) EC tablet 40 mg  40 mg Oral Daily Opyd, Lavone Neriimothy S, MD   40 mg at 06/03/17 0813  . polyethylene glycol (MIRALAX / GLYCOLAX) packet 17 g  17 g Oral Daily PRN Opyd, Lavone Neriimothy S, MD      . senna (SENOKOT) tablet 8.6 mg  1 tablet Oral BID Albina BilletMartensen, Henry Calvin III, PA-C   8.6 mg at 06/03/17 [id number]     Discharge Medications: Please see discharge summary for a list of discharge medications.  Relevant Imaging Results:  Relevant Lab Results:   Additional Information SS: [id number]  Evelen Vazguez, DeadwoodJenna H, LCSW

## 2017-06-03 NOTE — Progress Notes (Signed)
[facility] Progress Note  Patient: [name] ZOX:[id number]   PCP: [name], MD DOB: 1932/06/28   DOA: 06/01/2017   DOS: 06/03/2017   Date of Service: the patient was seen and examined on 06/03/2017  Subjective: Tolerated surgery very well. No acute events. No acute complaint. Pain is well-controlled. Was feeling dizzy after working with physical therapy would currently resolved.  Brief hospital course: Pt. with PMH of type II DM, hypertension, CAD S/P CABG; admitted on 06/01/2017, presented with complaint of fall mechanical, was found to have right hip fracture. Currently further plan is follow-up postoperative course.  Assessment and Plan: 1. Right hip fracture - Pt presents with severe right hip pain, unable to bear weight, after a mechanical fall at home  - Radiographs demonstrated right intertrochanteric femur fracture  - Orthopedic surgery is consulting and much appreciated  - Based on the available data, [name] presents an estimated 0.9% risk probability for perioperative MI or cardiac arrest per Nolon Nations al  - Tolerated surgery. We'll monitor postoperative recovery.  2. CAD - Pt is s/p CABG in 2013 with no angina since  - She is managed medically with ASA 81 and Coreg  3. Hypertension - BP at goal  - Continue Coreg, Norvasc    4. Type II DM - A1c was 7.7% in 2016  - Managed at home with Invokana and metformin  - Check CBG q4h while NPO and start a moderate-intensity SSI   5. Microcytic anemia  - Hgb is stable, continue to monitor. - Denies melena or hematochezia; no bleeding evident   6. Complete heart block  - PPM is present and pacing    Diet: Cardiac diet DVT Prophylaxis: subcutaneous Heparin  Advance goals of care discussion: Full code  Family Communication: no family was present at bedside, at the time of interview.  Disposition:  Discharge to SNF tomorrow.  Consultants: Orthopedics Procedures: Right hip intramedullary nail  placement  Antibiotics: Anti-infectives    Start     Dose/Rate Route Frequency Ordered Stop   06/02/17 1900  ceFAZolin (ANCEF) IVPB 2g/100 mL premix     2 g 200 mL/hr over 30 Minutes Intravenous Every 6 hours 06/02/17 1712 06/03/17 0051   06/02/17 1100  ceFAZolin (ANCEF) IVPB 2g/100 mL premix  Status:  Discontinued     2 g 200 mL/hr over 30 Minutes Intravenous On call to O.R. 06/02/17 1047 06/02/17 1706       Objective: Physical Exam: Vitals:   06/03/17 0800 06/03/17 1135 06/03/17 1213 06/03/17 1320  BP: (!) 117/42 (!) 96/55 (!) 107/41 (!) 107/40  Pulse: 88   73  Resp:    16  Temp: 98.1 F (36.7 C)   98.2 F (36.8 C)  TempSrc: Oral   Oral  SpO2: 98%     Weight:      Height:        Intake/Output Summary (Last 24 hours) at 06/03/17 1824 Last data filed at 06/03/17 1700  Gross per 24 hour  Intake          1113.33 ml  Output              600 ml  Net           513.33 ml   Filed Weights   06/01/17 1614  Weight: 67.1 kg (148 lb)   General: Alert, Awake and Oriented to Time, Place and Person. Appear in moderate distress, affect appropriate Eyes: PERRL, Conjunctiva normal ENT: Oral Mucosa clear moist. Neck:  no JVD, no Abnormal Mass Or lumps Cardiovascular: S1 and S2 Present, no Murmur, Peripheral Pulses Present Respiratory: normal respiratory effort, Bilateral Air entry equal and Decreased, no use of accessory muscle, Clear to Auscultation, no Crackles, no wheezes Abdomen: Bowel Sound present, Soft and no tenderness, no hernia Skin: no redness, no Rash, no induration Extremities: no Pedal edema, no calf tenderness Neurologic: Grossly no focal neuro deficit. Bilaterally Equal motor strength  Data Reviewed: CBC:  Recent Labs Lab 06/01/17 1631 06/03/17 0753  WBC 8.2 8.8  NEUTROABS 6.6  --   HGB 11.1* 8.3*  HCT 35.8* 28.1*  MCV 76.2* 81.2  PLT 235 138*   Basic Metabolic Panel:  Recent Labs Lab 06/01/17 1631 06/03/17 0753  NA 142 137  K 3.5 3.7  CL 107 106    CO2 26 19*  GLUCOSE 138* 122*  BUN 17 14  CREATININE 0.89 0.89  CALCIUM 8.9 8.3*  MG  --  1.8    Liver Function Tests: No results for input(s): AST, ALT, ALKPHOS, BILITOT, PROT, ALBUMIN in the last 168 hours. No results for input(s): LIPASE, AMYLASE in the last 168 hours. No results for input(s): AMMONIA in the last 168 hours. Coagulation Profile:  Recent Labs Lab 06/01/17 1631  INR 0.96   Cardiac Enzymes: No results for input(s): CKTOTAL, CKMB, CKMBINDEX, TROPONINI in the last 168 hours. BNP (last 3 results) No results for input(s): PROBNP in the last 8760 hours. CBG:  Recent Labs Lab 06/03/17 0518 06/03/17 0544 06/03/17 0650 06/03/17 1136 06/03/17 1633  GLUCAP 122* 121* 123* 229* 259*   Studies: No results found.  Scheduled Meds: . amLODipine  5 mg Oral Daily  . aspirin EC  325 mg Oral Q breakfast  . brimonidine  1 drop Left Eye BID  . carvedilol  6.25 mg Oral BID WC  . docusate sodium  100 mg Oral BID  . feeding supplement (GLUCERNA SHAKE)  237 mL Oral BID BM  . FLUoxetine  10 mg Oral Daily  . insulin aspart  0-15 Units Subcutaneous Q4H  . latanoprost  1 drop Left Eye QHS  . pantoprazole  40 mg Oral Daily  . senna  1 tablet Oral BID   Continuous Infusions: . sodium chloride 100 mL/hr at 06/03/17 0816   PRN Meds: bisacodyl, HYDROcodone-acetaminophen, labetalol, magnesium citrate, menthol-cetylpyridinium **OR** phenol, morphine injection, polyethylene glycol  Time spent: 35 minutes  Author: Lynden OxfordPranav Patel, MD [facility] Pager: [phone] 06/03/2017 6:24 PM  If 7PM-7AM, please contact night-coverage at [email or web address], password [email or web address]

## 2017-06-03 NOTE — Progress Notes (Signed)
[facility] authorization.  Burna SisJenna H. Maelynn Moroney, LCSW Clinical Social Worker [phone]

## 2017-06-03 NOTE — Progress Notes (Signed)
SLP Cancellation Note  Patient Details Name: [name] MRN: [id number] DOB: 1932/02/12   Cancelled treatment:       Reason Eval/Treat Not Completed: SLP screened, no needs identified, will sign off; pt on regular diet; tolerating well; no dysphagia hx per pt and/or per chart review   Tressie StalkerPat Devante Capano, M.S., CCC-SLP 06/03/2017, 9:48 AM

## 2017-06-03 NOTE — Progress Notes (Signed)
   Assessment: 1 Day Post-Op  S/P Procedure(s) (LRB): INTRAMEDULLARY (IM) NAIL INTERTROCHANTRIC RIGHT HIP (Right) by Dr. Jewel Baizeimothy D. Eulah PontMurphy on 06/02/17  Principal Problem:   Closed right hip fracture, initial encounter [facility](HCC) Active Problems:   Diabetes (HCC)   Essential hypertension   Coronary artery disease   Complete heart block (HCC)   Microcytic anemia  Doing well POD1.  Less pain - controlled PO.  Not yet OOB.  Labs pending.   Plan: Advance diet Up with therapy Incentive Spirometry Apply ice  Weight Bearing: Weight Bearing as Tolerated (WBAT) RLE Dressings: PRN.  VTE prophylaxis: Aspirin, SCDs, ambulation Dispo: PT eval pending.  May need SNF.  Subjective: Patient reports pain as mild to moderate. Pain controlled with PO meds.  Tolerating diet.  Urinating.   No CP, SOB.  Not yet OOB.  Objective:   VITALS:   Vitals:   06/02/17 1700 06/02/17 2042 06/03/17 0046 06/03/17 0519  BP: (!) 109/52 (!) 104/45 (!) 90/49 (!) 119/54  Pulse: 82 88 86 88  Resp: 18 18 17 17   Temp: 99.1 F (37.3 C) 100.2 F (37.9 C) 99.2 F (37.3 C) 99.2 F (37.3 C)  TempSrc: Oral Oral Oral Oral  SpO2: 94% 92% 93% 93%  Weight:      Height:       CBC Latest Ref Rng & Units 06/01/2017 02/26/2015 02/24/2015  WBC 4.0 - 10.5 K/uL 8.2 6.9 6.9  Hemoglobin 12.0 - 15.0 g/dL 11.1(L) 12.3 12.8  Hematocrit 36.0 - 46.0 % 35.8(L) 38.1 40.4  Platelets 150 - 400 K/uL 235 141(L) 198   BMP Latest Ref Rng & Units 06/01/2017 02/26/2015 02/24/2015  Glucose 65 - 99 mg/dL 562(Z138(H) 308(M235(H) 578(I247(H)  BUN 6 - 20 mg/dL 17 14 20   Creatinine 0.44 - 1.00 mg/dL 6.960.89 2.950.88 2.840.98  Sodium 135 - 145 mmol/L 142 137 135  Potassium 3.5 - 5.1 mmol/L 3.5 4.2 4.1  Chloride 101 - 111 mmol/L 107 108 106  CO2 22 - 32 mmol/L 26 24 26   Calcium 8.9 - 10.3 mg/dL 8.9 8.5 9.0   Intake/Output      06/07 0701 - 06/08 0700 06/08 0701 - 06/09 0700   P.O. 160    I.V. (mL/kg) 2822.5 (42.1)    Total Intake(mL/kg) 2982.5 (44.4)    Urine (mL/kg/hr) 750  (0.5)    Blood 200 (0.1)    Total Output 950     Net +2032.5            Physical Exam: General: NAD.  Supine in bed.  Conversant.  No increased wob MSK Neurovascularly intact Sensation intact distally Feet warm Dorsiflexion/Plantar flexion intact Incision: dressings C/D/I   Albina BilletHenry Calvin Martensen III, PA-C 06/03/2017, 7:28 AM

## 2017-06-03 NOTE — Evaluation (Signed)
Physical Therapy Evaluation Patient Details Name: [name] MRN: [id number] DOB: 22-Mar-1932 Today's Date: 06/03/2017   History of Present Illness  Alison Bakesancy P Mccardle is a 83 y.o. female with medical history significant for hypertension, type 2 diabetes mellitus, and coronary artery disease status post CABG in 2013, now presenting to the emergency department with severe right hip pain after a fall at home. Patient reports that she was in her usual state of health and "having a great day," until she tripped while pulling weeds in her garden. Pt s/p R IM nailing (06/02/17).    Clinical Impression  Pt admitted with above diagnosis. Pt currently with functional limitations due to the deficits listed below (see PT Problem List). Pt will benefit from skilled PT to increase their independence and safety with mobility to allow discharge to the venue listed below.  Pt requiring MAX A at time of eval, but giving forth good effort.  Recommend SNF, but pt is unsure about this d/c plan and no family present.  Will continue to assess.     Follow Up Recommendations SNF;Supervision/Assistance - 24 hour;Supervision for mobility/OOB    Equipment Recommendations  Rolling walker with 5" wheels;3in1 (PT)    Recommendations for Other Services       Precautions / Restrictions Precautions Precautions: Fall Restrictions Weight Bearing Restrictions: Yes RLE Weight Bearing: Weight bearing as tolerated      Mobility  Bed Mobility Overal bed mobility: Needs Assistance Bed Mobility: Rolling;Supine to Sit Rolling: Mod assist   Supine to sit: Max assist     General bed mobility comments: A for R LE, hips turned and to get trunk upright, but pt giving forth good effort.  Transfers Overall transfer level: Needs assistance Equipment used: Rolling walker (2 wheeled) Transfers: Sit to/from [employer] CorporationStand;Stand Pivot Transfers Sit to Stand: Max assist Stand pivot transfers: Max assist       General transfer comment:  MAX A to stand with rocking for momentum and elevated bed.  Slow transition for hand placement on RW.  MAX A to move RW, but was able to clear floor 50% of the time and shuffeling the other 50%  Ambulation/Gait             General Gait Details: SPT only  Stairs            Wheelchair Mobility    Modified Rankin (Stroke Patients Only)       Balance Overall balance assessment: History of Falls;Needs assistance   Sitting balance-Leahy Scale: Fair       Standing balance-Leahy Scale: Poor Standing balance comment: requires UE support                             Pertinent Vitals/Pain Pain Assessment: Faces Faces Pain Scale: Hurts even more Pain Location: unable to rate pain, but states it is sore at rest, but grimacing with mobility. Pain Descriptors / Indicators: Grimacing Pain Intervention(s): Limited activity within patient's tolerance;Monitored during session;Ice applied (NT with ice pack to place after catheter placement)    Home Living Family/patient expects to be discharged to:: Private residence Living Arrangements: Alone Available Help at Discharge: Family;Available PRN/intermittently Type of Home: House Home Access: Ramped entrance     Home Layout: One level Home Equipment: Grab bars - toilet Additional Comments: Daughters spend the night with her and are there during the day sometimes. Daughter is Runner, broadcasting/film/videoteacher and can stay with some over the summer    Prior  Function Level of Independence: Independent               Hand Dominance   Dominant Hand: Right    Extremity/Trunk Assessment   Upper Extremity Assessment Upper Extremity Assessment: Defer to OT evaluation    Lower Extremity Assessment Lower Extremity Assessment: RLE deficits/detail RLE Deficits / Details: fair- quad set, limited ROM due to pain. Noted R ankle to be plantarflexed and inverted, but able to do ankle pumps and circles.  Pt reports hx of R ankle injury. RLE:  Unable to fully assess due to pain       Communication   Communication: No difficulties  Cognition Arousal/Alertness: Awake/alert Behavior During Therapy: WFL for tasks assessed/performed Overall Cognitive Status: Within Functional Limits for tasks assessed                                        General Comments      Exercises General Exercises - Lower Extremity Ankle Circles/Pumps: AROM;Both;10 reps Quad Sets: AROM;Both;5 reps Hip ABduction/ADduction: AAROM;Right;5 reps   Assessment/Plan    PT Assessment Patient needs continued PT services  PT Problem List Decreased strength;Decreased range of motion;Decreased activity tolerance;Decreased balance;Decreased mobility       PT Treatment Interventions DME instruction;Gait training;Functional mobility training;Therapeutic activities;Therapeutic exercise;Patient/family education;Balance training    PT Goals (Current goals can be found in the Care Plan section)  Acute Rehab PT Goals Patient Stated Goal: return to PLOF PT Goal Formulation: With patient Time For Goal Achievement: 06/10/17 Potential to Achieve Goals: Good    Frequency Min 3X/week   Barriers to discharge        Co-evaluation               AM-PAC PT "6 Clicks" Daily Activity  Outcome Measure Difficulty turning over in bed (including adjusting bedclothes, sheets and blankets)?: Total Difficulty moving from lying on back to sitting on the side of the bed? : Total Difficulty sitting down on and standing up from a chair with arms (e.g., wheelchair, bedside commode, etc,.)?: Total Help needed moving to and from a bed to chair (including a wheelchair)?: A Lot Help needed walking in hospital room?: A Lot Help needed climbing 3-5 steps with a railing? : Total 6 Click Score: 8    End of Session Equipment Utilized During Treatment: Gait belt Activity Tolerance: Patient limited by pain Patient left: in chair;with nursing/sitter in room;Other  (comment) (MD, nurse, and nurse tech in room) Nurse Communication: Mobility status PT Visit Diagnosis: Unsteadiness on feet (R26.81);Difficulty in walking, not elsewhere classified (R26.2)    Time: 1610-9604 PT Time Calculation (min) (ACUTE ONLY): 25 min   Charges:   PT Evaluation $PT Eval Moderate Complexity: 1 Procedure PT Treatments $Therapeutic Activity: 8-22 mins   PT G Codes:        [place] Mowatt L. Katrinka Blazing, [place] Pager [phone] 06/03/2017   Enzo Montgomery 06/03/2017, 10:20 AM

## 2017-06-03 NOTE — Clinical Social Work Note (Signed)
Clinical Social Work Assessment  Patient Details  Name: [name] MRN: [id number] Date of Birth: 04-May-1932  Date of referral:  06/03/17               Reason for consult:  Facility Placement                Permission sought to share information with:  Family Supports, Oceanographeracility Contact Representative Permission granted to share information::  Yes, Verbal Permission Granted  Name::     Vicenta AlyDonnie, Carolyn  Agency::  snf  Relationship::  son-in-law, Paramedicdtr  Contact Information:     Housing/Transportation Living arrangements for the past 2 months:  Skilled Building surveyorursing Facility Source of Information:  Adult Children, Patient Patient Interpreter Needed:  None Criminal Activity/Legal Involvement Pertinent to Current Situation/Hospitalization:  No - Comment as needed Significant Relationships:  Adult Children Lives with:  Self Do you feel safe going back to the place where you live?  No Need for family participation in patient care:  No (Coment)  Care giving concerns:  Lives at home alone but with increased impairment after hip fracture   Social Worker assessment / plan:  Spoke with pt and dtr concerning PT recommendation for SNF.  Explained SNF and SNF referral process as well as insurance barriers to placement at most facilities.  Employment status:  Retired Database administratornsurance information:  Managed Medicare PT Recommendations:  Skilled Nursing Facility Information / Referral to community resources:  Skilled Nursing Facility  Patient/Family's Response to care:  Pt agreeable to SNF if needed but hopeful to go home if she feels better tomorrow.  Patient/Family's Understanding of and Emotional Response to Diagnosis, Current Treatment, and Prognosis: no questions or concerns hopeful for short rehab stay.  Emotional Assessment Appearance:  Appears stated age Attitude/Demeanor/Rapport:    Affect (typically observed):  Accepting, Appropriate Orientation:  Oriented to Situation, Oriented to  Time,  Oriented to Place, Oriented to Self Alcohol / Substance use:  Not Applicable Psych involvement (Current and /or in the community):  No (Comment)  Discharge Needs  Concerns to be addressed:  Care Coordination Readmission within the last 30 days:  No Current discharge risk:  Physical Impairment Barriers to Discharge:  Continued Medical Work up   Burna SisUris, Marton Malizia H, LCSW 06/03/2017, 4:19 PM

## 2017-06-04 ENCOUNTER — Encounter (HOSPITAL_COMMUNITY): Payer: Self-pay | Admitting: Physician Assistant

## 2017-06-04 DIAGNOSIS — D62 Acute posthemorrhagic anemia: Secondary | ICD-10-CM

## 2017-06-04 HISTORY — DX: Acute posthemorrhagic anemia: D62

## 2017-06-04 LAB — GLUCOSE, CAPILLARY
GLUCOSE-CAPILLARY: 173 mg/dL — AB (ref 65–99)
GLUCOSE-CAPILLARY: 134 mg/dL — AB (ref 65–99)
Glucose-Capillary: 132 mg/dL — ABNORMAL HIGH (ref 65–99)
Glucose-Capillary: 136 mg/dL — ABNORMAL HIGH (ref 65–99)
Glucose-Capillary: 137 mg/dL — ABNORMAL HIGH (ref 65–99)

## 2017-06-04 LAB — CBC
HCT: 23.3 % — ABNORMAL LOW (ref 36.0–46.0)
Hemoglobin: 7 g/dL — ABNORMAL LOW (ref 12.0–15.0)
MCH: 23.6 pg — ABNORMAL LOW (ref 26.0–34.0)
MCHC: 30 g/dL (ref 30.0–36.0)
MCV: 78.7 fL (ref 78.0–100.0)
PLATELETS: 130 10*3/uL — AB (ref 150–400)
RBC: 2.96 MIL/uL — AB (ref 3.87–5.11)
RDW: 16.5 % — ABNORMAL HIGH (ref 11.5–15.5)
WBC: 7.5 10*3/uL (ref 4.0–10.5)

## 2017-06-04 LAB — BASIC METABOLIC PANEL
ANION GAP: 8 (ref 5–15)
BUN: 17 mg/dL (ref 6–20)
CO2: 23 mmol/L (ref 22–32)
Calcium: 8.4 mg/dL — ABNORMAL LOW (ref 8.9–10.3)
Chloride: 106 mmol/L (ref 101–111)
Creatinine, Ser: 0.73 mg/dL (ref 0.44–1.00)
Glucose, Bld: 133 mg/dL — ABNORMAL HIGH (ref 65–99)
POTASSIUM: 3.8 mmol/L (ref 3.5–5.1)
SODIUM: 137 mmol/L (ref 135–145)

## 2017-06-04 LAB — PREPARE RBC (CROSSMATCH)

## 2017-06-04 MED ORDER — SODIUM CHLORIDE 0.9 % IV SOLN: Freq: Once | INTRAVENOUS | Status: DC

## 2017-06-04 MED ORDER — GLUCERNA SHAKE PO LIQD: 237.0000 mL | Freq: Two times a day (BID) | ORAL | 0 refills | Status: DC

## 2017-06-04 MED ORDER — INSULIN ASPART 100 UNIT/ML ~~LOC~~ SOLN: 0.0000 [IU] | Freq: Every day | SUBCUTANEOUS | Status: DC

## 2017-06-04 MED ORDER — INSULIN ASPART 100 UNIT/ML ~~LOC~~ SOLN
0.0000 [IU] | Freq: Three times a day (TID) | SUBCUTANEOUS | Status: DC
  Administered 2017-06-04 (×2): 2 [IU] via SUBCUTANEOUS
  Administered 2017-06-04: 3 [IU] via SUBCUTANEOUS

## 2017-06-04 NOTE — Evaluation (Signed)
Occupational Therapy Evaluation Patient Details Name: [name] MRN: [id number] DOB: 01-13-1932 Today's Date: 06/04/2017    History of Present Illness Alison Bakesancy P Hack is a 83 y.o. female with medical history significant for hypertension, type 2 diabetes mellitus, and coronary artery disease status post CABG in 2013, now presenting to the emergency department with severe right hip pain after a fall at home. Patient reports that she was in her usual state of health and "having a great day," until she tripped while pulling weeds in her garden. Pt s/p R IM nailing (06/02/17).   Clinical Impression   PTA, pt was living alone and was independent in ADLs, IADLs, and driving. Pt currently requires Max A for LB ADLs, and Max A for transfers. Pt would benefit from acute OT to increase her occupational performance and participation. Recommend dc to SNF for further OT to increase independence ans safety with ADLs and functional mobility.     Follow Up Recommendations  SNF;Supervision/Assistance - 24 hour ;    Equipment Recommendations  Other (comment) (Defer to next venue)    Recommendations for Other Services PT consult     Precautions / Restrictions Precautions Precautions: Fall Restrictions Weight Bearing Restrictions: Yes RLE Weight Bearing: Weight bearing as tolerated      Mobility Bed Mobility Overal bed mobility: Needs Assistance Bed Mobility: Rolling;Supine to Sit Rolling: Mod assist   Supine to sit: Max assist     General bed mobility comments: A for R LE, hips turned and to get trunk upright, but pt giving forth good effort.  Transfers Overall transfer level: Needs assistance Equipment used: Rolling walker (2 wheeled) Transfers: Sit to/from [employer];Stand Pivot Transfers Sit to Stand: Max assist Stand pivot transfers: Max assist       General transfer comment: MAX A to stand with rocking for momentum and elevated bed.  Slow transition for hand placement on RW.  MAX A to  move RW, but was able to clear floor 50% of the time and shuffeling the other 50%    Balance Overall balance assessment: History of Falls;Needs assistance   Sitting balance-Leahy Scale: Fair       Standing balance-Leahy Scale: Poor Standing balance comment: requires UE support                           ADL either performed or assessed with clinical judgement   ADL Overall ADL's : Needs assistance/impaired Eating/Feeding: Set up;Sitting   Grooming: Set up;Sitting;Brushing hair Grooming Details (indicate cue type and reason): Pt maintained sitting balance at EOB to brush her hair with supervision Upper Body Bathing: Sitting;Min guard   Lower Body Bathing: Maximal assistance;Moderate assistance;Sit to/from stand   Upper Body Dressing : Supervision/safety;Set up;Sitting   Lower Body Dressing: Maximal assistance;Sit to/from stand;Moderate assistance Lower Body Dressing Details (indicate cue type and reason): Pt able to adjust L sock. Unable to reach R sock.  Toilet Transfer: Minimal assistance;Stand-pivot;Cueing for sequencing;RW (simulated to recliner)           Functional mobility during ADLs: Minimal assistance;Rolling walker (short distance) General ADL Comments: Pt demonstrates decreased fucntional performance and requires Mod-Max A for LB ADLs. Pt very willing to participate.     Vision         Perception     Praxis      Pertinent Vitals/Pain Pain Assessment: Faces Faces Pain Scale: Hurts even more Pain Location: unable to rate pain, but states it is sore at rest,  but grimacing with mobility. Pain Descriptors / Indicators: Grimacing Pain Intervention(s): Limited activity within patient's tolerance;Monitored during session;Repositioned;Ice applied     Hand Dominance Right   Extremity/Trunk Assessment Upper Extremity Assessment Upper Extremity Assessment: Overall WFL for tasks assessed   Lower Extremity Assessment Lower Extremity Assessment:  Defer to PT evaluation       Communication Communication Communication: No difficulties   Cognition Arousal/Alertness: Lethargic Behavior During Therapy: WFL for tasks assessed/performed Overall Cognitive Status: Within Functional Limits for tasks assessed                                 General Comments: Pt with overall drowsiness. Tendency to close her eyes during conversation. Pt increase alertness during movement and tasks.    General Comments  Pt receiving blood transfusion. RN states pt ok to move. BP 116/68    Exercises     Shoulder Instructions      Home Living Family/patient expects to be discharged to:: Private residence Living Arrangements: Alone Available Help at Discharge: Family;Available PRN/intermittently Type of Home: House Home Access: Ramped entrance     Home Layout: One level     Bathroom Shower/Tub: Tub/shower unit (does sponge bath)   Bathroom Toilet: Handicapped height     Home Equipment: Grab bars - toilet   Additional Comments: Daughters spend the night with her and are there during the day sometimes. Daughter is Runner, broadcasting/film/video and can stay with some over the summer      Prior Functioning/Environment Level of Independence: Independent                 OT Problem List: Decreased strength;Decreased range of motion;Decreased activity tolerance;Impaired balance (sitting and/or standing);Decreased safety awareness;Decreased knowledge of use of DME or AE;Decreased knowledge of precautions;Pain      OT Treatment/Interventions: Self-care/ADL training;Therapeutic exercise;Energy conservation;DME and/or AE instruction;Therapeutic activities;Patient/family education    OT Goals(Current goals can be found in the care plan section) Acute Rehab OT Goals Patient Stated Goal: return to PLOF OT Goal Formulation: With patient Time For Goal Achievement: 06/18/17 Potential to Achieve Goals: Good ADL Goals Pt Will Perform Grooming: with min  guard assist;standing Pt Will Perform Upper Body Dressing: sitting;with modified independence Pt Will Perform Lower Body Dressing: with min assist;with adaptive equipment;with caregiver independent in assisting;sit to/from stand Pt Will Transfer to Toilet: with min assist;bedside commode;ambulating Pt Will Perform Toileting - Clothing Manipulation and hygiene: with min assist;sit to/from stand  OT Frequency: Min 2X/week   Barriers to D/C:            Co-evaluation              AM-PAC PT "6 Clicks" Daily Activity     Outcome Measure Help from another person eating meals?: None Help from another person taking care of personal grooming?: A Little Help from another person toileting, which includes using toliet, bedpan, or urinal?: A Little Help from another person bathing (including washing, rinsing, drying)?: A Lot Help from another person to put on and taking off regular upper body clothing?: A Lot Help from another person to put on and taking off regular lower body clothing?: A Little 6 Click Score: 17   End of Session Equipment Utilized During Treatment: Gait belt;Rolling walker Nurse Communication: Mobility status;Weight bearing status  Activity Tolerance: Patient limited by pain;Patient limited by fatigue Patient left: in chair;with call bell/phone within reach;with nursing/sitter in room  OT Visit Diagnosis: Unsteadiness on  feet (R26.81);Other abnormalities of gait and mobility (R26.89);Muscle weakness (generalized) (M62.81);History of falling (Z91.81);Pain Pain - Right/Left: Right Pain - part of body: Leg                Time: 7829-5621 OT Time Calculation (min): 25 min Charges:  OT General Charges $OT Visit: 1 Procedure OT Evaluation $OT Eval Low Complexity: 1 Procedure OT Treatments $Self Care/Home Management : 8-22 mins G-Codes:     Lynelle Weiler MSOT, OTR/L Acute Rehab Pager: [phone] Office: [phone]  Theodoro Grist Manning Luna 06/04/2017, 1:26 PM

## 2017-06-04 NOTE — Discharge Summary (Signed)
[facility] Discharge Summary   Patient: [name] WUJ:[id number]   PCP: [name], Richard, MD DOB: 1932/11/07   Date of admission: 06/01/2017   Date of discharge:  06/04/2017    Discharge Diagnoses:  Principal Problem:   Closed right hip fracture, initial encounter [facility](HCC) Active Problems:   Diabetes (HCC)   Essential hypertension   Coronary artery disease   Complete heart block (HCC)   Microcytic anemia   Postoperative anemia due to acute blood loss   Admitted From: home Disposition:  SNF  Recommendations for Outpatient Follow-up:  1. Please follow up with PCP with CBC in 1 week.     Contact information for follow-up providers    Sheral ApleyMurphy, Timothy D, MD Follow up in 2 week(s).   Specialty:  Orthopedic Surgery Contact information: [address] [place] [address] [phone]        [name], Richard, MD. Schedule an appointment as soon as possible for a visit in 1 week(s).   Specialty:  Internal Medicine Contact information: [address] [place] [address] [phone]            Contact information for after-discharge care    Destination    [facility] Follow up.   Specialty:  Skilled Nursing Facility Contact information: [address] [place] [id number] [phone]                 Diet recommendation: carb modified diet  Activity: The patient is advised to gradually reintroduce usual activities.  Discharge Condition: good  Code Status: full code  History of present illness: As per the H and P dictated on admission, "[name] is a 83 y.o. female with medical history significant for hypertension, type 2 diabetes mellitus, and coronary artery disease status post CABG in 2013, now presenting to the emergency department with severe right hip pain after a fall at home. Patient reports that she was in her usual state of health and "having a great day," until she tripped while pulling  weeds in her garden. She landed on her right side without any head strike or loss of consciousness, but noted immediate and severe pain in the right hip. She was unable to get up. EMS was called out and she was transported to the hospital. Patient denies any anginal symptoms since her CABG 5 years ago. She reports working in her garden without any dyspnea, and also notes that she can ascend a flight of stairs without difficulty."  Hospital Course:  Summary of her active problems in the hospital is as following. 1. Right hip fracture - Pt presents with severe right hip pain, unable to bear weight, after a mechanical fall at home  - Radiographs demonstrated right intertrochanteric femur fracture  - Orthopedic surgery is consulting and much appreciated  - Based on the available data, [name] presents an estimated 0.9% risk probability for perioperative MI or cardiac arrest per Chales AbrahamsGupta, et al  - Tolerated surgery - 1 PRBC post-op..  2. CAD - Pt is s/p CABG in 2013 with no angina since  - She is managed medically with ASA 81 and Coreg No on 325 aspirin for DVT PX.   3. Hypertension - BP at goal  - Continue Coreg, Norvasc   4. Type II DM - A1c was 7.7% in 2016  - Managed at home with Invokana and metformin  - can continue moderate-intensity SSI at SNF  5. Microcytic anemia  Post op acute  blood loss anemia.  - Hgb is low after surgery, post op. No evidence of external bleeding.  S/P 1 PRBC.  - Denies melena or hematochezia; no bleeding evident   6. Complete heart block  - PPM is present and pacing   All other chronic medical condition were stable during the hospitalization.  Patient was seen by physical therapy, who recommended SNF, which was arranged by Child psychotherapist and case Production designer, theatre/television/film. On the day of the discharge the patient's vitals were stable, and no other acute medical condition were reported by patient. the patient was felt safe to be discharge at SNF with  therapy.  Procedures and Results:  PRBC transfusion   INTRAMEDULLARY Nail implant right hip.   Consultations:  orthopedics  DISCHARGE MEDICATION: Current Discharge Medication List    START taking these medications   Details  aspirin EC 325 MG tablet Take 1 tablet (325 mg total) by mouth daily. For 30 days post op for DVT Prophylaxis Qty: 30 tablet, Refills: 0    docusate sodium (COLACE) 100 MG capsule Take 1 capsule (100 mg total) by mouth 2 (two) times daily. To prevent constipation while taking pain medication. Qty: 60 capsule, Refills: 0    feeding supplement, GLUCERNA SHAKE, (GLUCERNA SHAKE) LIQD Take 237 mLs by mouth 2 (two) times daily between meals. Qty: 30 Can, Refills: 0    HYDROcodone-acetaminophen (NORCO) 5-325 MG tablet Take 1-2 tablets by mouth every 6 (six) hours as needed for moderate pain. Qty: 40 tablet, Refills: 0      CONTINUE these medications which have NOT CHANGED   Details  amLODipine (NORVASC) 5 MG tablet Take 5 mg by mouth daily. Refills: 5   Associated Diagnoses: Essential hypertension    brimonidine (ALPHAGAN) 0.15 % ophthalmic solution USE 1 DROP INTO LEFT EYE TWICE A DAY Refills: 2    carvedilol (COREG) 6.25 MG tablet TAKE ONE TABLET BY MOUTH TWICE DAILY Qty: 60 tablet, Refills: 11    FLUoxetine (PROZAC) 10 MG tablet Take 10 mg by mouth daily.    INVOKANA 300 MG TABS tablet Take 300 mg by mouth daily. Refills: 5    latanoprost (XALATAN) 0.005 % ophthalmic solution INSTILL 1 DROP IN LEFT EYE NIGHTLY Refills: 6    metFORMIN (GLUCOPHAGE) 500 MG tablet Take 500 mg by mouth daily with breakfast.  Refills: 3    omeprazole (PRILOSEC) 20 MG capsule       STOP taking these medications     aspirin 81 MG tablet        Allergies  Allergen Reactions  . Lisinopril Anaphylaxis  . Quinine Other (See Comments)    Pt states heart attack-like symptoms  . Penicillins Swelling and Other (See Comments)    Patient states that she had swelling  at the injection site after her last Penicillin shot but no swelling in the throat.  . Sulfonamide Derivatives Rash   Discharge Instructions    Diet - low sodium heart healthy    Complete by:  As directed    Discharge instructions    Complete by:  As directed    It is important that you read following instructions as well as go over your medication list with RN to help you understand your care after this hospitalization.  Discharge Instructions: Please follow-up with PCP in one week  Please request your primary care physician to go over all Hospital Tests and Procedure/Radiological results at the follow up,  Please get all Hospital records sent to your PCP by signing hospital  release before you go home.   Do not drive, operating heavy machinery, perform activities at heights, swimming or participation in water activities or provide baby sitting services while you are on Pain, Sleep and Anxiety Medications; until you have been seen by Primary Care Physician or a Neurologist and advised to do so again. Do not take more than prescribed Pain, Sleep and Anxiety Medications. You were cared for by a hospitalist during your hospital stay. If you have any questions about your discharge medications or the care you received while you were in the hospital after you are discharged, you can call the unit and ask to speak with the hospitalist on call if the hospitalist that took care of you is not available.  Once you are discharged, your primary care physician will handle any further medical issues. Please note that NO REFILLS for any discharge medications will be authorized once you are discharged, as it is imperative that you return to your primary care physician (or establish a relationship with a primary care physician if you do not have one) for your aftercare needs so that they can reassess your need for medications and monitor your lab values. You Must read complete instructions/literature along with  all the possible adverse reactions/side effects for all the Medicines you take and that have been prescribed to you. Take any new Medicines after you have completely understood and accept all the possible adverse reactions/side effects. Wear Seat belts while driving.   Increase activity slowly    Complete by:  As directed      Discharge Exam: Filed Weights   06/01/17 1614  Weight: 67.1 kg (148 lb)   Vitals:   06/04/17 0931 06/04/17 1130  BP: (!) 116/49 116/68  Pulse: 90 81  Resp: 18 (!) 22  Temp: 98.2 F (36.8 C) 99.9 F (37.7 C)   General: Appear in mild distress, no Rash; Oral Mucosa moist. Cardiovascular: S1 and S2 Present, no Murmur, no JVD Respiratory: Bilateral Air entry present and Clear to Auscultation, no Crackles, no wheezes Abdomen: Bowel Sound present, Soft and no tenderness Extremities: no Pedal edema, no calf tenderness Neurology: Grossly no focal neuro deficit.  The results of significant diagnostics from this hospitalization (including imaging, microbiology, ancillary and laboratory) are listed below for reference.    Significant Diagnostic Studies: Dg Chest 1 View  Result Date: 06/01/2017 CLINICAL DATA:  Fall with right hip pain EXAM: CHEST 1 VIEW COMPARISON:  02/27/2015 FINDINGS: Chronic cardiomegaly and aortic tortuosity. Status post CABG and mitral valve replacement. Stable dual-chamber pacer leads from the left. There is no edema, consolidation, effusion, or pneumothorax. Chronic deformity of the lateral left chest wall. No acute fracture noted. IMPRESSION: Stable from prior.  No evidence of acute disease. Electronically Signed   By: Marnee Spring M.D.   On: 06/01/2017 17:14   Pelvis Portable  Result Date: 06/02/2017 CLINICAL DATA:  Right femur fracture EXAM: PORTABLE PELVIS 1-2 VIEWS COMPARISON:  06/02/2017, 06/01/2017 FINDINGS: Calcified pelvic phleboliths. Symmetric SI joints. Pubic symphysis intact. Partially visualized intramedullary rod in the proximal  right femur across partially visualized intertrochanteric fracture. IMPRESSION: Partially visualized surgical rod fixation of the incompletely visualized right intertrochanteric fracture. Electronically Signed   By: Jasmine Pang M.D.   On: 06/02/2017 15:31   Dg C-arm 1-60 Min  Result Date: 06/02/2017 CLINICAL DATA:  Intramedullary nail placement for right proximal femur fracture EXAM: DG C-ARM 61-120 MIN; RIGHT FEMUR 2 VIEWS COMPARISON:  Pelvis and right hip radiographs June 01, 2017 FLUOROSCOPY  TIME:  0 minutes 50 second; 4 acquired images FINDINGS: Frontal and lateral views were obtained. There is screw and nail fixation through a comminuted intertrochanteric femur fracture. Alignment fracture site is near anatomic. The tip screws in proximal femoral head. Nail is positioned in the medullary portion of the femur. No new fracture. No dislocation. There is slight narrowing in the right hip joint region. IMPRESSION: Screw and nail fixation through proximal intertrochanteric femur fracture on the right with alignment anatomic. No new fracture. No dislocation. Tip of screw in proximal femoral head. Electronically Signed   By: Bretta Bang III M.D.   On: 06/02/2017 14:38   Dg Hip Unilat With Pelvis 2-3 Views Right  Result Date: 06/01/2017 CLINICAL DATA:  Right hip pain after fall at home today. EXAM: DG HIP (WITH OR WITHOUT PELVIS) 2-3V RIGHT COMPARISON:  None. FINDINGS: Comminuted and displaced fracture is seen involving the intertrochanteric region of the proximal right femur. Left hip appears normal. IMPRESSION: Comminuted and displaced intertrochanteric fracture of the proximal right femur. Electronically Signed   By: Lupita Raider, M.D.   On: 06/01/2017 17:15   Dg Femur, Min 2 Views Right  Result Date: 06/02/2017 CLINICAL DATA:  Intramedullary nail placement for right proximal femur fracture EXAM: DG C-ARM 61-120 MIN; RIGHT FEMUR 2 VIEWS COMPARISON:  Pelvis and right hip radiographs June 01, 2017  FLUOROSCOPY TIME:  0 minutes 50 second; 4 acquired images FINDINGS: Frontal and lateral views were obtained. There is screw and nail fixation through a comminuted intertrochanteric femur fracture. Alignment fracture site is near anatomic. The tip screws in proximal femoral head. Nail is positioned in the medullary portion of the femur. No new fracture. No dislocation. There is slight narrowing in the right hip joint region. IMPRESSION: Screw and nail fixation through proximal intertrochanteric femur fracture on the right with alignment anatomic. No new fracture. No dislocation. Tip of screw in proximal femoral head. Electronically Signed   By: Bretta Bang III M.D.   On: 06/02/2017 14:38    Microbiology: Recent Results (from the past 240 hour(s))  Surgical PCR screen     Status: Abnormal   Collection Time: 06/01/17 11:38 PM  Result Value Ref Range Status   MRSA, PCR NEGATIVE NEGATIVE Final   Staphylococcus aureus POSITIVE (A) NEGATIVE Final    Comment:        The Xpert SA Assay (FDA approved for NASAL specimens in patients over 60 years of age), is one component of a comprehensive surveillance program.  Test performance has been validated by [facility] for patients greater than or equal to 28 year old. It is not intended to diagnose infection nor to guide or monitor treatment.      Labs: CBC:  Recent Labs Lab 06/01/17 1631 06/03/17 0753 06/04/17 0425  WBC 8.2 8.8 7.5  NEUTROABS 6.6  --   --   HGB 11.1* 8.3* 7.0*  HCT 35.8* 28.1* 23.3*  MCV 76.2* 81.2 78.7  PLT 235 138* 130*   Basic Metabolic Panel:  Recent Labs Lab 06/01/17 1631 06/03/17 0753 06/04/17 0425  NA 142 137 137  K 3.5 3.7 3.8  CL 107 106 106  CO2 26 19* 23  GLUCOSE 138* 122* 133*  BUN 17 14 17   CREATININE 0.89 0.89 0.73  CALCIUM 8.9 8.3* 8.4*  MG  --  1.8  --    Liver Function Tests: No results for input(s): AST, ALT, ALKPHOS, BILITOT, PROT, ALBUMIN in the last 168 hours. No results for  input(s): LIPASE, AMYLASE in the last 168 hours. No results for input(s): AMMONIA in the last 168 hours. Cardiac Enzymes: No results for input(s): CKTOTAL, CKMB, CKMBINDEX, TROPONINI in the last 168 hours. BNP (last 3 results) No results for input(s): BNP in the last 8760 hours. CBG:  Recent Labs Lab 06/03/17 2029 06/04/17 0009 06/04/17 0434 06/04/17 0802 06/04/17 1158  GLUCAP 192* 136* 137* 132* 173*   Time spent: 35 minutes  Signed:  Niv Darley  [facility]  06/04/2017  , 3:09 PM

## 2017-06-04 NOTE — Progress Notes (Signed)
Clinical Social Worker facilitated patient discharge including contacting patient family and facility to confirm patient discharge plans.  Clinical information faxed to facility and family agreeable with plan.  CSW arranged [facility] [place] .  RN to call [phone] and ask for nurse Joya Gaskinsoni Green (pt will go into rm# [phone]) report prior to discharge.  Clinical Social Worker will sign off for now as social work intervention is no longer needed. Please consult us again if new need arises.  Marrianne MoodAshley Mariell Nester, MSW, [employer] [phone]

## 2017-06-04 NOTE — Progress Notes (Signed)
Physical Therapy Treatment Patient Details Name: [name] MRN: [id number] DOB: [birth year] Today's Date: 06/04/2017    History of Present Illness [name] is a 83 y.o. female with medical history significant for hypertension, type 2 diabetes mellitus, and coronary artery disease status post CABG in 2013, now presenting to the emergency department with severe right hip pain after a fall at home. Patient reports that she was in her usual state of health and "having a great day," until she tripped while pulling weeds in her garden. Pt s/p R IM nailing (06/02/17).    PT Comments    Patient is making progress toward mobility goals and tolerated short distance gait and therex this session. Continue to progress as tolerated with anticipated d/c to SNF for further skilled PT services.     Follow Up Recommendations  SNF;Supervision/Assistance - 24 hour;Supervision for mobility/OOB     Equipment Recommendations  Rolling walker with 5" wheels;3in1 (PT)    Recommendations for Other Services       Precautions / Restrictions Precautions Precautions: Fall Restrictions Weight Bearing Restrictions: Yes RLE Weight Bearing: Weight bearing as tolerated    Mobility  Bed Mobility Overal bed mobility: Needs Assistance Bed Mobility: Rolling;Supine to Sit Rolling: Mod assist   Supine to sit: Max assist     General bed mobility comments: pt OOB in chair upon arrival  Transfers Overall transfer level: Needs assistance Equipment used: Rolling walker (2 wheeled) Transfers: Sit to/from Stand Sit to Stand: Mod assist Stand pivot transfers: Max assist       General transfer comment: from recliner and BSC;cues for hand placement and assist to power up into standing  Ambulation/Gait Ambulation/Gait assistance: Mod assist Ambulation Distance (Feet):  (38ft then 71ft) Assistive device: Rolling walker (2 wheeled) Gait Pattern/deviations: Step-through pattern;Decreased stance time -  right;Decreased step length - left;Decreased step length - right;Decreased dorsiflexion - right;Decreased weight shift to right     General Gait Details: pt with difficulty with R hip and knee flexion during swing phase; multimodal cues for posture, sequencing, safe use of AD, and weight shifting; pt with improving step through pattern however limited by R LE fatigue   Stairs            Wheelchair Mobility    Modified Rankin (Stroke Patients Only)       Balance Overall balance assessment: History of Falls;Needs assistance   Sitting balance-Leahy Scale: Fair       Standing balance-Leahy Scale: Poor Standing balance comment: requires UE support                            Cognition Arousal/Alertness: Awake/alert Behavior During Therapy: WFL for tasks assessed/performed Overall Cognitive Status: Within Functional Limits for tasks assessed                                 General Comments: Pt with overall drowsiness. Tendency to close her eyes during conversation. Pt increase alertness during movement and tasks.       Exercises General Exercises - Lower Extremity Ankle Circles/Pumps: AROM;Both;10 reps Quad Sets: AROM;Right;10 reps Long Arc Quad: Right;10 reps;AAROM Heel Slides: AAROM;Right;10 reps Hip ABduction/ADduction: AAROM;Right;10 reps    General Comments General comments (skin integrity, edema, etc.): SpO2 down to 89% on 2L O2 via [place] with ambulation; pt on 3L O2 beginning and end of session      Pertinent Vitals/Pain  Pain Assessment: Faces Faces Pain Scale: Hurts little more Pain Location: R hip with mobility Pain Descriptors / Indicators: Grimacing;Guarding;Sore Pain Intervention(s): Limited activity within patient's tolerance;Monitored during session;Premedicated before session;Repositioned    Home Living Family/patient expects to be discharged to:: Private residence Living Arrangements: Alone Available Help at Discharge:  Family;Available PRN/intermittently Type of Home: House Home Access: Ramped entrance   Home Layout: One level Home Equipment: Grab bars - toilet Additional Comments: Daughters spend the night with her and are there during the day sometimes. Daughter is Runner, broadcasting/film/videoteacher and can stay with some over the summer    Prior Function Level of Independence: Independent          PT Goals (current goals can now be found in the care plan section) Acute Rehab PT Goals Patient Stated Goal: return to PLOF Progress towards PT goals: Progressing toward goals    Frequency    Min 3X/week      PT Plan Current plan remains appropriate    Co-evaluation              AM-PAC PT "6 Clicks" Daily Activity  Outcome Measure  Difficulty turning over in bed (including adjusting bedclothes, sheets and blankets)?: Total Difficulty moving from lying on back to sitting on the side of the bed? : Total Difficulty sitting down on and standing up from a chair with arms (e.g., wheelchair, bedside commode, etc,.)?: Total Help needed moving to and from a bed to chair (including a wheelchair)?: A Lot Help needed walking in hospital room?: A Lot Help needed climbing 3-5 steps with a railing? : Total 6 Click Score: 8    End of Session Equipment Utilized During Treatment: Gait belt;Oxygen Activity Tolerance: Patient limited by pain;Patient limited by fatigue Patient left: in chair;with call bell/phone within reach Nurse Communication: Mobility status PT Visit Diagnosis: Unsteadiness on feet (R26.81);Difficulty in walking, not elsewhere classified (R26.2)     Time: [id number] PT Time Calculation (min) (ACUTE ONLY): 42 min  Charges:  $Gait Training: 23-37 mins $Therapeutic Exercise: 8-22 mins                    G Codes:       Erline LevineKellyn Krysia Zahradnik, PTA Pager: [phone]) [phone]     Carolynne EdouardKellyn R Lebaron Bautch 06/04/2017, 2:22 PM

## 2017-06-04 NOTE — Progress Notes (Signed)
Subjective: 2 Days Post-Op Procedure(s) (LRB): INTRAMEDULLARY (IM) NAIL INTERTROCHANTRIC RIGHT HIP (Right) Patient reports pain as 2 on 0-10 scale.    Objective: Vital signs in last 24 hours: Temp:  [98.2 F (36.8 C)-98.8 F (37.1 C)] 98.6 F (37 C) (06/09 0448) Pulse Rate:  [73-86] 85 (06/09 0448) Resp:  [16] 16 (06/08 1320) BP: (96-125)/(40-55) 111/50 (06/09 0448) SpO2:  [96 %-100 %] 96 % (06/09 0448)  Intake/Output from previous day: 06/08 0701 - 06/09 0700 In: 1233.3 [P.O.:360; I.V.:873.3] Out: 475 [Urine:475] Intake/Output this shift: No intake/output data recorded.   Recent Labs  06/01/17 1631 06/03/17 0753 06/04/17 0425  HGB 11.1* 8.3* 7.0*    Recent Labs  06/03/17 0753 06/04/17 0425  WBC 8.8 7.5  RBC 3.46* 2.96*  HCT 28.1* 23.3*  PLT 138* 130*    Recent Labs  06/03/17 0753 06/04/17 0425  NA 137 137  K 3.7 3.8  CL 106 106  CO2 19* 23  BUN 14 17  CREATININE 0.89 0.73  GLUCOSE 122* 133*  CALCIUM 8.3* 8.4*    Recent Labs  06/01/17 1631  INR 0.96    ABD soft Neurovascular intact  Surgical wounds well approximated with scant drainage On O2 nasal canula  Assessment/Plan: 2 Days Post-Op Procedure(s) (LRB): INTRAMEDULLARY (IM) NAIL INTERTROCHANTRIC RIGHT HIP (Right)  Principal Problem:   Closed right hip fracture, initial encounter (HCC) Active Problems:   Diabetes (HCC)   Essential hypertension   Coronary artery disease   Complete heart block (HCC)   Microcytic anemia   Postoperative anemia due to acute blood loss  Getting blood today.  Agree with SNF when medically stable   orthopaedically doing great Advance diet Up with therapy  Karon Cotterill J 06/04/2017, 8:42 AM

## 2017-06-06 LAB — BPAM RBC
BLOOD PRODUCT EXPIRATION DATE: 201807012359
Blood Product Expiration Date: 201807012359
ISSUE DATE / TIME: 201806090857
UNIT TYPE AND RH: 9500
Unit Type and Rh: 9500

## 2017-06-06 LAB — TYPE AND SCREEN
ABO/RH(D): O NEG
Antibody Screen: POSITIVE
DAT, IGG: NEGATIVE
DONOR AG TYPE: NEGATIVE
DONOR AG TYPE: NEGATIVE
UNIT DIVISION: 0
UNIT DIVISION: 0

## 2017-06-09 ENCOUNTER — Encounter: Payer: Medicare HMO | Admitting: *Deleted

## 2017-08-01 ENCOUNTER — Ambulatory Visit (HOSPITAL_COMMUNITY)
Admission: RE | Admit: 2017-08-01 | Discharge: 2017-08-01 | Disposition: A | Discharge status: Home / Self Care | Payer: Medicare HMO | Source: Ambulatory Visit | Attending: Family Medicine | Admitting: Family Medicine

## 2017-08-01 ENCOUNTER — Other Ambulatory Visit (HOSPITAL_COMMUNITY): Payer: Self-pay | Admitting: Family Medicine

## 2017-08-01 DIAGNOSIS — I714 Abdominal aortic aneurysm, without rupture: Secondary | ICD-10-CM | POA: Insufficient documentation

## 2017-08-01 DIAGNOSIS — M546 Pain in thoracic spine: Secondary | ICD-10-CM

## 2017-08-01 DIAGNOSIS — R937 Abnormal findings on diagnostic imaging of other parts of musculoskeletal system: Secondary | ICD-10-CM | POA: Insufficient documentation

## 2017-08-01 DIAGNOSIS — T07XXXA Unspecified multiple injuries, initial encounter: Secondary | ICD-10-CM

## 2017-08-01 DIAGNOSIS — I700 Atherosclerosis of aorta: Secondary | ICD-10-CM | POA: Insufficient documentation

## 2017-08-08 ENCOUNTER — Inpatient Hospital Stay (HOSPITAL_COMMUNITY)
Admission: EM | Admit: 2017-08-08 | Discharge: 2017-08-16 | DRG: 175 | Disposition: A | Discharge status: Skilled Nursing Facility | Payer: Medicare HMO | Attending: Family Medicine | Admitting: Family Medicine

## 2017-08-08 ENCOUNTER — Encounter (HOSPITAL_COMMUNITY): Payer: Self-pay

## 2017-08-08 ENCOUNTER — Emergency Department (HOSPITAL_COMMUNITY): Payer: Medicare HMO

## 2017-08-08 DIAGNOSIS — D509 Iron deficiency anemia, unspecified: Secondary | ICD-10-CM | POA: Diagnosis present

## 2017-08-08 DIAGNOSIS — Z888 Allergy status to other drugs, medicaments and biological substances status: Secondary | ICD-10-CM

## 2017-08-08 DIAGNOSIS — J9601 Acute respiratory failure with hypoxia: Secondary | ICD-10-CM | POA: Diagnosis present

## 2017-08-08 DIAGNOSIS — I351 Nonrheumatic aortic (valve) insufficiency: Secondary | ICD-10-CM | POA: Diagnosis not present

## 2017-08-08 DIAGNOSIS — Z87891 Personal history of nicotine dependence: Secondary | ICD-10-CM

## 2017-08-08 DIAGNOSIS — E039 Hypothyroidism, unspecified: Secondary | ICD-10-CM | POA: Diagnosis present

## 2017-08-08 DIAGNOSIS — L89312 Pressure ulcer of right buttock, stage 2: Secondary | ICD-10-CM | POA: Diagnosis present

## 2017-08-08 DIAGNOSIS — M25532 Pain in left wrist: Secondary | ICD-10-CM | POA: Diagnosis present

## 2017-08-08 DIAGNOSIS — I82403 Acute embolism and thrombosis of unspecified deep veins of lower extremity, bilateral: Secondary | ICD-10-CM | POA: Diagnosis not present

## 2017-08-08 DIAGNOSIS — Z952 Presence of prosthetic heart valve: Secondary | ICD-10-CM

## 2017-08-08 DIAGNOSIS — Z950 Presence of cardiac pacemaker: Secondary | ICD-10-CM | POA: Diagnosis present

## 2017-08-08 DIAGNOSIS — E0800 Diabetes mellitus due to underlying condition with hyperosmolarity without nonketotic hyperglycemic-hyperosmolar coma (NKHHC): Secondary | ICD-10-CM

## 2017-08-08 DIAGNOSIS — R0902 Hypoxemia: Secondary | ICD-10-CM

## 2017-08-08 DIAGNOSIS — Z79899 Other long term (current) drug therapy: Secondary | ICD-10-CM

## 2017-08-08 DIAGNOSIS — D62 Acute posthemorrhagic anemia: Secondary | ICD-10-CM

## 2017-08-08 DIAGNOSIS — R9430 Abnormal result of cardiovascular function study, unspecified: Secondary | ICD-10-CM

## 2017-08-08 DIAGNOSIS — Z9889 Other specified postprocedural states: Secondary | ICD-10-CM

## 2017-08-08 DIAGNOSIS — M1990 Unspecified osteoarthritis, unspecified site: Secondary | ICD-10-CM | POA: Diagnosis present

## 2017-08-08 DIAGNOSIS — Z86711 Personal history of pulmonary embolism: Secondary | ICD-10-CM

## 2017-08-08 DIAGNOSIS — E876 Hypokalemia: Secondary | ICD-10-CM | POA: Diagnosis present

## 2017-08-08 DIAGNOSIS — I472 Ventricular tachycardia: Secondary | ICD-10-CM | POA: Diagnosis present

## 2017-08-08 DIAGNOSIS — I82413 Acute embolism and thrombosis of femoral vein, bilateral: Secondary | ICD-10-CM | POA: Diagnosis present

## 2017-08-08 DIAGNOSIS — I10 Essential (primary) hypertension: Secondary | ICD-10-CM | POA: Diagnosis not present

## 2017-08-08 DIAGNOSIS — L8990 Pressure ulcer of unspecified site, unspecified stage: Secondary | ICD-10-CM | POA: Insufficient documentation

## 2017-08-08 DIAGNOSIS — Z951 Presence of aortocoronary bypass graft: Secondary | ICD-10-CM

## 2017-08-08 DIAGNOSIS — I442 Atrioventricular block, complete: Secondary | ICD-10-CM | POA: Diagnosis present

## 2017-08-08 DIAGNOSIS — Z96641 Presence of right artificial hip joint: Secondary | ICD-10-CM | POA: Diagnosis present

## 2017-08-08 DIAGNOSIS — I340 Nonrheumatic mitral (valve) insufficiency: Secondary | ICD-10-CM | POA: Diagnosis present

## 2017-08-08 DIAGNOSIS — Z7984 Long term (current) use of oral hypoglycemic drugs: Secondary | ICD-10-CM

## 2017-08-08 DIAGNOSIS — I480 Paroxysmal atrial fibrillation: Secondary | ICD-10-CM | POA: Diagnosis present

## 2017-08-08 DIAGNOSIS — I110 Hypertensive heart disease with heart failure: Secondary | ICD-10-CM | POA: Diagnosis present

## 2017-08-08 DIAGNOSIS — I2510 Atherosclerotic heart disease of native coronary artery without angina pectoris: Secondary | ICD-10-CM | POA: Diagnosis present

## 2017-08-08 DIAGNOSIS — R6510 Systemic inflammatory response syndrome (SIRS) of non-infectious origin without acute organ dysfunction: Secondary | ICD-10-CM | POA: Diagnosis present

## 2017-08-08 DIAGNOSIS — I5022 Chronic systolic (congestive) heart failure: Secondary | ICD-10-CM | POA: Diagnosis present

## 2017-08-08 DIAGNOSIS — I2609 Other pulmonary embolism with acute cor pulmonale: Principal | ICD-10-CM | POA: Diagnosis present

## 2017-08-08 DIAGNOSIS — I25708 Atherosclerosis of coronary artery bypass graft(s), unspecified, with other forms of angina pectoris: Secondary | ICD-10-CM | POA: Diagnosis not present

## 2017-08-08 DIAGNOSIS — I82431 Acute embolism and thrombosis of right popliteal vein: Secondary | ICD-10-CM | POA: Diagnosis present

## 2017-08-08 DIAGNOSIS — Z882 Allergy status to sulfonamides status: Secondary | ICD-10-CM

## 2017-08-08 DIAGNOSIS — I429 Cardiomyopathy, unspecified: Secondary | ICD-10-CM | POA: Diagnosis not present

## 2017-08-08 DIAGNOSIS — IMO0002 Reserved for concepts with insufficient information to code with codable children: Secondary | ICD-10-CM | POA: Diagnosis present

## 2017-08-08 DIAGNOSIS — I2699 Other pulmonary embolism without acute cor pulmonale: Secondary | ICD-10-CM | POA: Diagnosis present

## 2017-08-08 DIAGNOSIS — S72001A Fracture of unspecified part of neck of right femur, initial encounter for closed fracture: Secondary | ICD-10-CM

## 2017-08-08 DIAGNOSIS — Z7189 Other specified counseling: Secondary | ICD-10-CM | POA: Diagnosis not present

## 2017-08-08 DIAGNOSIS — I82409 Acute embolism and thrombosis of unspecified deep veins of unspecified lower extremity: Secondary | ICD-10-CM

## 2017-08-08 DIAGNOSIS — Z880 Allergy status to penicillin: Secondary | ICD-10-CM

## 2017-08-08 LAB — COMPREHENSIVE METABOLIC PANEL
ALT: 6 U/L — ABNORMAL LOW (ref 14–54)
ANION GAP: 10 (ref 5–15)
AST: 16 U/L (ref 15–41)
Albumin: 3.4 g/dL — ABNORMAL LOW (ref 3.5–5.0)
Alkaline Phosphatase: 70 U/L (ref 38–126)
BUN: 13 mg/dL (ref 6–20)
CHLORIDE: 102 mmol/L (ref 101–111)
CO2: 26 mmol/L (ref 22–32)
Calcium: 8.9 mg/dL (ref 8.9–10.3)
Creatinine, Ser: 0.71 mg/dL (ref 0.44–1.00)
GFR calc non Af Amer: >60 mL/min (ref >60)
Glucose, Bld: 146 mg/dL — ABNORMAL HIGH (ref 65–99)
Potassium: 3.5 mmol/L (ref 3.5–5.1)
SODIUM: 138 mmol/L (ref 135–145)
Total Bilirubin: 0.7 mg/dL (ref 0.3–1.2)
Total Protein: 7 g/dL (ref 6.5–8.1)

## 2017-08-08 LAB — CBC WITH DIFFERENTIAL/PLATELET
Basophils Absolute: 0 10*3/uL (ref 0.0–0.1)
Basophils Relative: 0 %
Eosinophils Absolute: 0.1 10*3/uL (ref 0.0–0.7)
Eosinophils Relative: 1 %
HEMATOCRIT: 35.8 % — AB (ref 36.0–46.0)
HEMOGLOBIN: 10.6 g/dL — AB (ref 12.0–15.0)
LYMPHS ABS: 1.1 10*3/uL (ref 0.7–4.0)
LYMPHS PCT: 15 %
MCH: 23.8 pg — AB (ref 26.0–34.0)
MCHC: 29.6 g/dL — AB (ref 30.0–36.0)
MCV: 80.4 fL (ref 78.0–100.0)
MONOS PCT: 9 %
Monocytes Absolute: 0.7 10*3/uL (ref 0.1–1.0)
NEUTROS ABS: 5.8 10*3/uL (ref 1.7–7.7)
NEUTROS PCT: 75 %
Platelets: 133 10*3/uL — ABNORMAL LOW (ref 150–400)
RBC: 4.45 MIL/uL (ref 3.87–5.11)
RDW: 17 % — ABNORMAL HIGH (ref 11.5–15.5)
WBC: 7.8 10*3/uL (ref 4.0–10.5)

## 2017-08-08 LAB — URINALYSIS, ROUTINE W REFLEX MICROSCOPIC
Bacteria, UA: NONE SEEN
Bilirubin Urine: NEGATIVE
Glucose, UA: >=500 mg/dL — AB
HGB URINE DIPSTICK: NEGATIVE
Ketones, ur: 20 mg/dL — AB
Nitrite: NEGATIVE
Protein, ur: NEGATIVE mg/dL
SPECIFIC GRAVITY, URINE: 1.031 — AB (ref 1.005–1.030)
pH: 5 (ref 5.0–8.0)

## 2017-08-08 LAB — TROPONIN I: Troponin I: <0.03 ng/mL (ref <0.03)

## 2017-08-08 LAB — APTT: aPTT: 33 seconds (ref 24–36)

## 2017-08-08 LAB — PROTIME-INR
INR: 1.05
PROTHROMBIN TIME: 13.7 s (ref 11.4–15.2)

## 2017-08-08 LAB — I-STAT CG4 LACTIC ACID, ED: LACTIC ACID, VENOUS: 1.21 mmol/L (ref 0.5–1.9)

## 2017-08-08 MED ORDER — SODIUM CHLORIDE 0.9 % IV SOLN
250.0000 mL | INTRAVENOUS | Status: DC | PRN
  Administered 2017-08-12: 250 mL via INTRAVENOUS

## 2017-08-08 MED ORDER — ONDANSETRON HCL 4 MG/2ML IJ SOLN: 4.0000 mg | Freq: Four times a day (QID) | INTRAMUSCULAR | Status: DC | PRN

## 2017-08-08 MED ORDER — HEPARIN SODIUM (PORCINE) 5000 UNIT/ML IJ SOLN: 4000.0000 [IU] | Freq: Once | INTRAMUSCULAR | Status: DC

## 2017-08-08 MED ORDER — OXYCODONE HCL 5 MG PO TABS
10.0000 mg | ORAL_TABLET | Freq: Four times a day (QID) | ORAL | Status: DC | PRN
  Administered 2017-08-09 – 2017-08-16 (×16): 10 mg via ORAL
  Filled 2017-08-08 (×16): qty 2

## 2017-08-08 MED ORDER — SODIUM CHLORIDE 0.9 % IV SOLN
INTRAVENOUS | Status: DC
  Administered 2017-08-08: 18:00:00 via INTRAVENOUS

## 2017-08-08 MED ORDER — FENTANYL CITRATE (PF) 100 MCG/2ML IJ SOLN
12.5000 ug | Freq: Once | INTRAMUSCULAR | Status: AC
  Administered 2017-08-08: 12.5 ug via INTRAVENOUS
  Filled 2017-08-08: qty 2

## 2017-08-08 MED ORDER — SODIUM CHLORIDE 0.9% FLUSH
3.0000 mL | Freq: Two times a day (BID) | INTRAVENOUS | Status: DC
  Administered 2017-08-09 – 2017-08-16 (×13): 3 mL via INTRAVENOUS

## 2017-08-08 MED ORDER — HEPARIN BOLUS VIA INFUSION
5000.0000 [IU] | Freq: Once | INTRAVENOUS | Status: AC
  Administered 2017-08-08: 5000 [IU] via INTRAVENOUS

## 2017-08-08 MED ORDER — INSULIN ASPART 100 UNIT/ML ~~LOC~~ SOLN
0.0000 [IU] | Freq: Three times a day (TID) | SUBCUTANEOUS | Status: DC
  Administered 2017-08-09: 1 [IU] via SUBCUTANEOUS
  Administered 2017-08-11: 2 [IU] via SUBCUTANEOUS
  Administered 2017-08-11 – 2017-08-12 (×2): 1 [IU] via SUBCUTANEOUS
  Administered 2017-08-12: 2 [IU] via SUBCUTANEOUS
  Administered 2017-08-13 (×2): 1 [IU] via SUBCUTANEOUS
  Administered 2017-08-13 – 2017-08-14 (×3): 2 [IU] via SUBCUTANEOUS
  Administered 2017-08-14 – 2017-08-15 (×2): 1 [IU] via SUBCUTANEOUS
  Administered 2017-08-15: 2 [IU] via SUBCUTANEOUS
  Administered 2017-08-15: 1 [IU] via SUBCUTANEOUS
  Administered 2017-08-16: 2 [IU] via SUBCUTANEOUS
  Administered 2017-08-16: 1 [IU] via SUBCUTANEOUS
  Administered 2017-08-16: 2 [IU] via SUBCUTANEOUS

## 2017-08-08 MED ORDER — DEXTROSE 5 % IV SOLN
2.0000 g | Freq: Once | INTRAVENOUS | Status: AC
  Administered 2017-08-08: 2 g via INTRAVENOUS
  Filled 2017-08-08: qty 2

## 2017-08-08 MED ORDER — PANTOPRAZOLE SODIUM 40 MG PO TBEC
40.0000 mg | DELAYED_RELEASE_TABLET | Freq: Every day | ORAL | Status: DC
  Administered 2017-08-09 – 2017-08-16 (×8): 40 mg via ORAL
  Filled 2017-08-08 (×8): qty 1

## 2017-08-08 MED ORDER — ONDANSETRON HCL 4 MG PO TABS: 4.0000 mg | ORAL_TABLET | Freq: Four times a day (QID) | ORAL | Status: DC | PRN

## 2017-08-08 MED ORDER — LEVOFLOXACIN IN D5W 750 MG/150ML IV SOLN
750.0000 mg | Freq: Once | INTRAVENOUS | Status: AC
  Administered 2017-08-08: 750 mg via INTRAVENOUS
  Filled 2017-08-08: qty 150

## 2017-08-08 MED ORDER — ALBUTEROL SULFATE (2.5 MG/3ML) 0.083% IN NEBU
5.0000 mg | INHALATION_SOLUTION | Freq: Once | RESPIRATORY_TRACT | Status: AC
  Administered 2017-08-08: 5 mg via RESPIRATORY_TRACT
  Filled 2017-08-08: qty 6

## 2017-08-08 MED ORDER — FLUOXETINE HCL 20 MG PO CAPS
20.0000 mg | ORAL_CAPSULE | Freq: Every day | ORAL | Status: DC
  Administered 2017-08-09 – 2017-08-16 (×8): 20 mg via ORAL
  Filled 2017-08-08 (×8): qty 1

## 2017-08-08 MED ORDER — HEPARIN (PORCINE) IN NACL 100-0.45 UNIT/ML-% IJ SOLN: 14.0000 [IU]/kg/h | Freq: Once | INTRAMUSCULAR | Status: DC

## 2017-08-08 MED ORDER — FENTANYL CITRATE (PF) 100 MCG/2ML IJ SOLN
25.0000 ug | INTRAMUSCULAR | Status: DC | PRN
  Administered 2017-08-09 – 2017-08-15 (×9): 25 ug via INTRAVENOUS
  Filled 2017-08-08 (×9): qty 2

## 2017-08-08 MED ORDER — IOPAMIDOL (ISOVUE-370) INJECTION 76%
100.0000 mL | Freq: Once | INTRAVENOUS | Status: AC | PRN
  Administered 2017-08-08: 100 mL via INTRAVENOUS

## 2017-08-08 MED ORDER — SODIUM CHLORIDE 0.9% FLUSH: 3.0000 mL | INTRAVENOUS | Status: DC | PRN

## 2017-08-08 MED ORDER — DOCUSATE SODIUM 100 MG PO CAPS
100.0000 mg | ORAL_CAPSULE | Freq: Two times a day (BID) | ORAL | Status: DC
Start: 2017-08-09 — End: 2017-08-16
  Administered 2017-08-10 – 2017-08-16 (×13): 100 mg via ORAL
  Filled 2017-08-08 (×13): qty 1

## 2017-08-08 MED ORDER — VANCOMYCIN HCL IN DEXTROSE 1-5 GM/200ML-% IV SOLN
1000.0000 mg | Freq: Once | INTRAVENOUS | Status: AC
  Administered 2017-08-08: 1000 mg via INTRAVENOUS
  Filled 2017-08-08: qty 200

## 2017-08-08 MED ORDER — POLYETHYLENE GLYCOL 3350 17 GM/SCOOP PO POWD
17.0000 g | Freq: Every day | ORAL | Status: DC | PRN
  Filled 2017-08-08: qty 255

## 2017-08-08 MED ORDER — TRAMADOL HCL 50 MG PO TABS
100.0000 mg | ORAL_TABLET | Freq: Four times a day (QID) | ORAL | Status: DC | PRN
  Administered 2017-08-10 – 2017-08-14 (×6): 100 mg via ORAL
  Filled 2017-08-08 (×7): qty 2

## 2017-08-08 MED ORDER — HEPARIN (PORCINE) IN NACL 100-0.45 UNIT/ML-% IJ SOLN
1100.0000 [IU]/h | INTRAMUSCULAR | Status: DC
  Administered 2017-08-08 – 2017-08-09 (×2): 1100 [IU]/h via INTRAVENOUS
  Filled 2017-08-08 (×2): qty 250

## 2017-08-08 NOTE — Progress Notes (Signed)
ANTICOAGULATION CONSULT NOTE - Initial Consult  Pharmacy Consult for HEPARIN Indication: pulmonary embolus  Allergies  Allergen Reactions  . Lisinopril Anaphylaxis  . Quinine Other (See Comments)    Pt states heart attack-like symptoms  . Penicillins Swelling and Other (See Comments)    Patient states that she had swelling at the injection site after her last Penicillin shot but no swelling in the throat.  . Sulfonamide Derivatives Rash    Patient Measurements: Height: 5\' 8"  (172.7 cm) Weight: 148 lb (67.1 kg) IBW/kg (Calculated) : 63.9  Vital Signs: Temp: 99.1 F (37.3 C) (08/13 1725) Temp Source: Oral (08/13 1725) BP: 134/69 (08/13 2049) Pulse Rate: 90 (08/13 2049)  Labs:  Recent Labs  08/08/17 1750  HGB 10.6*  HCT 35.8*  PLT 133*  CREATININE 0.71   Estimated Creatinine Clearance: 53.7 mL/min (by C-G formula based on SCr of 0.71 mg/dL).  Medical History: Past Medical History:  Diagnosis Date  . CAD (coronary artery disease)    a. s/p PCI to LAD '08, Severe 3V CAD by cath 03/27/12 --> CABG  . CHF (congestive heart failure) (HCC)   . Complete heart block (HCC)    a. s/p STJ dual chamber pacemaker  . Depression   . Hypertension   . Hypothyroidism   . Mitral regurgitation    moderate by cath, mild by echo 03/2012  . Osteoarthrosis, unspecified whether generalized or localized, unspecified site   . Paroxysmal atrial fibrillation (HCC)    a. identified s/p CABG b. OAC discontinued 02/2014 after no recurrence  . Personal history of other diseases of digestive system   . Postoperative anemia due to acute blood loss 06/04/2017  . Type II or unspecified type diabetes mellitus without mention of complication, not stated as uncontrolled   . Unspecified glaucoma(365.9)   . Unspecified hemorrhoids without mention of complication    Medications:   (Not in a hospital admission)  Assessment: 83yo female c/o SOB.  Asked to initiate Heparin for PE.  Goal of Therapy:   Heparin level 0.3-0.7 units/ml Monitor platelets by anticoagulation protocol: Yes   Plan:   Heparin 5000 units IV now x 1  Heparin infusion at 1100 units/hr  Heparin level daily  CBC daily while on Heparin   Valrie HartHall, Coren Crownover A 08/08/2017,9:00 PM

## 2017-08-08 NOTE — ED Triage Notes (Signed)
Reports of shortness of breath x3 days worsening today with fluid retention in legs. Also reports of pain in left hand pain that started last night. Unable to move fingers well. Cms intact.

## 2017-08-08 NOTE — ED Notes (Signed)
Patient transported to CT 

## 2017-08-08 NOTE — ED Provider Notes (Signed)
[facility]Y DEPT Provider Note   CSN: [id number] Arrival date & time: 08/08/17  1724     History   Chief Complaint Chief Complaint  Patient presents with  . Shortness of Breath  . Hand Pain    HPI [name] is a 84 y.o. female.  Patient from home. Patient has felt short of breath for the past 2 days. Get worse today. Also has increased leg swelling. Patient also has leftwrist pain no history of any fall. Patient status post 9 weeks ago replacement right hip replacement. Patient does not use oxygen at home.Patient also had a fever at home to 100.9.Patient's oxygen saturations at home were in the mid 80s. On 2 L here patient's oxygen saturation was in the low 90s. Patient denies any chest pain patient denies any abdominal pain. Patient has a history of a pacemaker for complete heart block.      Past Medical History:  Diagnosis Date  . CAD (coronary artery disease)    a. s/p PCI to LAD '08, Severe 3V CAD by cath 03/27/12 --> CABG  . CHF (congestive heart failure) (HCC)   . Complete heart block (HCC)    a. s/p STJ dual chamber pacemaker  . Depression   . Hypertension   . Hypothyroidism   . Mitral regurgitation    moderate by cath, mild by echo 03/2012  . Osteoarthrosis, unspecified whether generalized or localized, unspecified site   . Paroxysmal atrial fibrillation (HCC)    a. identified s/p CABG b. OAC discontinued 02/2014 after no recurrence  . Personal history of other diseases of digestive system   . Postoperative anemia due to acute blood loss 06/04/2017  . Type II or unspecified type diabetes mellitus without mention of complication, not stated as uncontrolled   . Unspecified glaucoma(365.9)   . Unspecified hemorrhoids without mention of complication     Patient Active Problem List   Diagnosis Date Noted  . Pulmonary emboli (HCC) 08/08/2017  . Postoperative anemia due to acute blood loss 06/04/2017  . Microcytic anemia 06/01/2017  . Closed right hip fracture,  initial encounter (HCC) 06/01/2017  . Pacemaker 05/30/2015  . Complete heart block (HCC) 02/24/2015  . Encounter for therapeutic drug monitoring 02/04/2014  . History of MI (myocardial infarction) 07/17/2012  . Hyperlipidemia LDL goal < 70 07/17/2012  . S/P CABG x 4 04/14/2012  . S/P MVR (mitral valve repair) 04/13/2012  . Ejection fraction < 50% 04/13/2012  . Medication intolerance 04/13/2012  . Coronary artery disease 03/28/2012  . Diabetes (HCC) 04/24/2009  . GLAUCOMA 04/24/2009  . Essential hypertension 04/24/2009  . HEMORRHOIDS 04/24/2009  . DEGENERATIVE JOINT DISEASE 04/24/2009  . DIVERTICULITIS, HX OF 04/24/2009    Past Surgical History:  Procedure Laterality Date  . CHOLECYSTECTOMY  12/2006  . CORONARY ARTERY BYPASS GRAFT  04/03/2012   Procedure: CORONARY ARTERY BYPASS GRAFTING (CABG);  Surgeon: Kerin Perna, MD;  Location: [facility] OR;  Service: Open Heart Surgery;  Laterality: N/A;  CABG x four;  using left internal mammary artery and bilateral greater saphenous veins  . ERCP  2008  . INTRAMEDULLARY (IM) NAIL INTERTROCHANTERIC Right 06/02/2017   Procedure: INTRAMEDULLARY (IM) NAIL INTERTROCHANTRIC RIGHT HIP;  Surgeon: Sheral Apley, MD;  Location: [facility] OR;  Service: Orthopedics;  Laterality: Right;  . LEFT HEART CATHETERIZATION WITH CORONARY ANGIOGRAM N/A 03/27/2012   Procedure: LEFT HEART CATHETERIZATION WITH CORONARY ANGIOGRAM;  Surgeon: Herby Abraham, MD;  Location: [facility] CATH LAB;  Service: Cardiovascular;  Laterality: N/A;  .  MITRAL VALVE REPAIR  04/03/2012   Procedure: MITRAL VALVE REPAIR (MVR);  Surgeon: Kerin PernaPeter Van Trigt, MD;  [facility];  Service: Open Heart Surgery;  Laterality: N/A;  . PERMANENT PACEMAKER INSERTION N/A 02/26/2015   STJ Assurity dual chamber pacemaker implanted by Dr Johney FrameAllred for symptomatic complete heart block    OB History    No data available       Home Medications    Prior to Admission medications   Medication Sig Start Date End Date Taking?  Authorizing Provider  amLODipine (NORVASC) 5 MG tablet Take 5 mg by mouth daily. 05/23/15  Yes [provider]  carvedilol (COREG) 6.25 MG tablet TAKE ONE TABLET BY MOUTH TWICE DAILY 08/09/16  Yes Branch, Dorothe PeaJonathan F, MD  FLUoxetine (PROZAC) 20 MG capsule Take 20 mg by mouth daily.    Yes [provider]  metFORMIN (GLUCOPHAGE) 500 MG tablet Take 500 mg by mouth daily with breakfast.  11/28/15  Yes [provider]  omeprazole (PRILOSEC) 20 MG capsule Take 20 mg by mouth daily.  12/11/10  Yes [provider]  oxycodone (OXY-IR) 5 MG capsule Take 5-10 mg by mouth every 6 (six) hours as needed for pain.   Yes [provider]  polyethylene glycol powder (MIRALAX) powder Take 17 g by mouth daily as needed.   Yes [provider]  aspirin EC 325 MG tablet Take 1 tablet (325 mg total) by mouth daily. For 30 days post op for DVT Prophylaxis Patient not taking: Reported on 08/08/2017 06/02/17   Albina BilletMartensen, Henry Calvin III, PA-C  docusate sodium (COLACE) 100 MG capsule Take 1 capsule (100 mg total) by mouth 2 (two) times daily. To prevent constipation while taking pain medication. Patient not taking: Reported on 08/08/2017 06/02/17   Albina BilletMartensen, Henry Calvin III, PA-C  feeding supplement, GLUCERNA SHAKE, (GLUCERNA SHAKE) LIQD Take 237 mLs by mouth 2 (two) times daily between meals. 06/04/17   Rolly SalterPatel, Pranav M, MD  HYDROcodone-acetaminophen (NORCO) 5-325 MG tablet Take 1-2 tablets by mouth every 6 (six) hours as needed for moderate pain. Patient not taking: Reported on 08/08/2017 06/02/17   Albina BilletMartensen, Henry Calvin III, PA-C  INVOKANA 300 MG TABS tablet Take 300 mg by mouth daily. 01/28/16   [provider]    Family History Family History  Problem Relation Age of Onset  . Heart disease Mother   . Heart disease Father 10085       CABG  . Arrhythmia Brother        slow HR off and on since his 4320s    Social History Social History  Substance Use Topics  .  Smoking status: Former Smoker    Types: Cigarettes    Quit date: 09/03/2004  . Smokeless tobacco: Never Used  . Alcohol use No     Allergies   Lisinopril; Quinine; Penicillins; and Sulfonamide derivatives   Review of Systems Review of Systems  Constitutional: Negative for chills and fever.  HENT: Negative for congestion.   Eyes: Negative for redness.  Respiratory: Positive for cough and shortness of breath.   Cardiovascular: Negative for chest pain.  Gastrointestinal: Negative for abdominal pain and nausea.  Genitourinary: Negative for dysuria.  Musculoskeletal: Negative for back pain.  Neurological: Negative for headaches.  Hematological: Does not bruise/bleed easily.  Psychiatric/Behavioral: Negative for behavioral problems.     Physical Exam Updated Vital Signs BP 135/64   Pulse 82   Temp 99.1 F (37.3 C) (Oral)   Resp (!) 22  Ht 1.727 m (5\' 8" )   Wt 67.1 kg (148 lb)   SpO2 96%   BMI 22.50 kg/m   Physical Exam  Constitutional: She is oriented to person, place, and time. She appears well-developed and well-nourished. She appears distressed.  HENT:  Head: Normocephalic and atraumatic.  Mouth/Throat: Oropharynx is clear and moist.  Eyes: Pupils are equal, round, and reactive to light. Conjunctivae and EOM are normal.  Neck: Normal range of motion. Neck supple.  Cardiovascular: Normal rate, regular rhythm and normal heart sounds.   Pulmonary/Chest: Effort normal and breath sounds normal. No respiratory distress.  Abdominal: Soft. Bowel sounds are normal. There is no tenderness.  Musculoskeletal: Normal range of motion. She exhibits edema and tenderness.  Bilateral leg swelling. Increased warmth and tenderness to palpation to the left wrist. No erythema. Nontender elbow and shoulder. Good cap refill to fingers.  Neurological: She is alert and oriented to person, place, and time. No cranial nerve deficit or sensory deficit. She exhibits normal muscle tone.  Coordination normal.  Skin: Skin is warm.     ED Treatments / Results  Labs (all labs ordered are listed, but only abnormal results are displayed) Labs Reviewed  COMPREHENSIVE METABOLIC PANEL - Abnormal; Notable for the following:       Result Value   Glucose, Bld 146 (*)    Albumin 3.4 (*)    ALT 6 (*)    All other components within normal limits  CBC WITH DIFFERENTIAL/PLATELET - Abnormal; Notable for the following:    Hemoglobin 10.6 (*)    HCT 35.8 (*)    MCH 23.8 (*)    MCHC 29.6 (*)    RDW 17.0 (*)    Platelets 133 (*)    All other components within normal limits  URINALYSIS, ROUTINE W REFLEX MICROSCOPIC - Abnormal; Notable for the following:    Specific Gravity, Urine 1.031 (*)    Glucose, UA >=500 (*)    Ketones, ur 20 (*)    Leukocytes, UA TRACE (*)    Squamous Epithelial / LPF 0-5 (*)    All other components within normal limits  CULTURE, BLOOD (ROUTINE X 2)  CULTURE, BLOOD (ROUTINE X 2)  PROTIME-INR  APTT  TROPONIN I  BASIC METABOLIC PANEL  HEPARIN LEVEL (UNFRACTIONATED)  CBC  I-STAT CG4 LACTIC ACID, ED  I-STAT CG4 LACTIC ACID, ED    EKG  EKG Interpretation  Date/Time:  Monday August 08 2017 17:36:27 EDT Ventricular Rate:  93 PR Interval:    QRS Duration: 160 QT Interval:  405 QTC Calculation: 504 R Axis:   -81 Text Interpretation:  Ventricular-paced rhythm No further analysis attempted due to paced rhythm Confirmed by Vanetta Mulders [phone]) on 08/08/2017 6:22:31 PM       Radiology Ct Angio Chest Pe W/cm &/or Wo Cm  Result Date: 08/08/2017 CLINICAL DATA:  Three day history of worsening shortness of breath. EXAM: CT ANGIOGRAPHY CHEST WITH CONTRAST TECHNIQUE: Multidetector CT imaging of the chest was performed using the standard protocol during bolus administration of intravenous contrast. Multiplanar CT image reconstructions and MIPs were obtained to evaluate the vascular anatomy. CONTRAST:  100 cc Isovue 370 COMPARISON:  None. FINDINGS:  Cardiovascular: Heart is enlarged. Patient is status post CABG Atherosclerotic calcification is noted in the wall of the thoracic aorta. Acute pulmonary embolus is identified in the right main pulmonary artery and lobar and more distal arteries to the right upper and lower lobes. Small clot burden is identified and pulmonary arteries to the left  upper and lower lobes in may be nonacute. Mediastinum/Nodes: 14 mm short axis subcarinal lymph node is associated with a 12 mm short axis lymph node in the right hilum and an 8 mm short axis left hilar lymph node. No axillary lymphadenopathy. Esophagus is unremarkable. Lungs/Pleura: Trace interlobular septal thickening is identified in the upper lobes. 4 mm subpleural nodule identified left upper lobe on image 26 series 6. No focal airspace consolidation. No overt pulmonary edema or pleural effusion. Compressive atelectasis noted posterior lower lobes bilaterally. Upper Abdomen: Unremarkable. Musculoskeletal: T9 compression fracture identified. Bones are diffusely osteopenic. Review of the MIP images confirms the above findings. IMPRESSION: 1. Acute pulmonary embolus identified in the right main pulmonary artery as well as lobar and more distal pulmonary arteries to the right upper and lower lobes. Although nonocclusive, the burden of thrombus in the right pulmonary artery is is relatively large. There is associated CT evidence of right heart strain (RV/LV Ratio = 1.1) consistent with at least submassive (intermediate risk) PE. The presence of right heart strain has been associated with an increased risk of morbidity and mortality. Please activate Code PE by paging [phone]. 2. There is trace string like embolus identified in multiple segmental and subsegmental branches to the left lung which may be acute, but given the string like appearance, chronic pulmonary embolus in the left lung would also be a consideration. 3. Mild mediastinal and right hilar lymphadenopathy,  indeterminate. Follow-up CT chest with contrast in 3 months recommended to reassess. 4. Osteopenia with T9 compression fracture. Critical Value/emergent results were called by telephone at the time of interpretation on 08/08/2017 at 8:12 pm to Dr. Vanetta Mulders , who verbally acknowledged these results. Electronically Signed   By: Kennith Center M.D.   On: 08/08/2017 20:12   Dg Chest Portable 1 View  Result Date: 08/08/2017 CLINICAL DATA:  Shortness of breath. EXAM: PORTABLE CHEST 1 VIEW COMPARISON:  06/01/2017 FINDINGS: Dual lead pacer. Reverse apical lordotic positioning. Numerous leads and wires project over the chest. Midline trachea. Cardiomegaly accentuated by AP portable technique. Possible trace left pleural fluid. No pneumothorax. Low lung volumes. Pulmonary interstitial prominence. Mild left base airspace disease. IMPRESSION: Cardiomegaly and low lung volumes. Suspect mild pulmonary venous congestion. Possible trace left pleural fluid with adjacent atelectasis. Electronically Signed   By: Jeronimo Greaves M.D.   On: 08/08/2017 17:59   Dg Hand Complete Left  Result Date: 08/08/2017 CLINICAL DATA:  Left hand pain. EXAM: LEFT HAND - COMPLETE 3+ VIEW COMPARISON:  None. FINDINGS: There is no evidence of fracture or dislocation. Mild diffuse osteoarthritis is seen involving the digits the wrist. No erosions or bony lesions identified. Soft tissues are unremarkable. IMPRESSION: Mild osteoarthritis of the hand wrist. Electronically Signed   By: Irish Lack M.D.   On: 08/08/2017 18:09    Procedures Procedures (including critical care time)  CRITICAL CARE Performed by: Vanetta Mulders Total critical care time: 30  minutes Critical care time was exclusive of separately billable procedures and treating other patients. Critical care was necessary to treat or prevent imminent or life-threatening deterioration. Critical care was time spent personally by me on the following activities: development of  treatment plan with patient and/or surrogate as well as nursing, discussions with consultants, evaluation of patient's response to treatment, examination of patient, obtaining history from patient or surrogate, ordering and performing treatments and interventions, ordering and review of laboratory studies, ordering and review of radiographic studies, pulse oximetry and re-evaluation of patient's condition.  Medications Ordered in ED  Medications  0.9 %  sodium chloride infusion ( Intravenous Stopped 08/08/17 2112)  heparin ADULT infusion 100 units/mL (25000 units/237mL sodium chloride 0.45%) (1,100 Units/hr Intravenous New Bag/Given 08/08/17 2108)  albuterol (PROVENTIL) (2.5 MG/3ML) 0.083% nebulizer solution 5 mg (5 mg Nebulization Given 08/08/17 1809)  levofloxacin (LEVAQUIN) IVPB 750 mg (0 mg Intravenous Stopped 08/08/17 2015)  aztreonam (AZACTAM) 2 g in dextrose 5 % 50 mL IVPB (0 g Intravenous Stopped 08/08/17 1839)  vancomycin (VANCOCIN) IVPB 1000 mg/200 mL premix (0 mg Intravenous Stopped 08/08/17 2126)  fentaNYL (SUBLIMAZE) injection 12.5 mcg (12.5 mcg Intravenous Given 08/08/17 1826)  iopamidol (ISOVUE-370) 76 % injection 100 mL (100 mLs Intravenous Contrast Given 08/08/17 1939)  fentaNYL (SUBLIMAZE) injection 12.5 mcg (12.5 mcg Intravenous Given 08/08/17 2018)  heparin bolus via infusion 5,000 Units (5,000 Units Intravenous Bolus from Bag 08/08/17 2108)     Initial Impression / Assessment and Plan / ED Course  I have reviewed the triage vital signs and the nursing notes.  Pertinent labs & imaging results that were available during my care of the patient were reviewed by me and considered in my medical decision making (see chart for details).   patient initially was treatedon sepsis protocol due to the fever at home and the increased respiratory rate. Patient's lactic acid was not elevated. Chest x-ray was negative for pneumonia. Also negative for any sniffing pulmonary edema. Did not have an  explanation for the hypoxia. So patient had CT angios done which showed evidence of a right pulmonary artery pulmonary embolus. With some right heart strain. Discussed with critical care I stated that patient appropriately did not really need to be on their service at this time. She was stable. Do not disagree with that. Patient was started on heparin. We initially did not get a troponin because she did not have any chest pain however it has been ordered. Discussed with hospitalist here at that they would probably want to transfer her to [facility] service they're making it easier of critical care to get involved if she got worse. Hospitalist will admit her here to the stepdown which the patient prefers. Patient's vital signs here have been stable.   I suspect the patient may have had a pulmonary embolus earlier she did have chest pain several days ago. Admitted May of been a second insult in the past 2 days.  Patient received IV heparin bolus and started on heparin drip.   Final Clinical Impressions(s) / ED Diagnoses   Final diagnoses:  Hypoxia  Pulmonary embolus, right (HCC)  Cor, pulmonale, acute (HCC)    New Prescriptions New Prescriptions   No medications on file     Vanetta Mulders, MD 08/08/17 2234

## 2017-08-08 NOTE — Progress Notes (Signed)
Pharmacy Note:  Initial antibiotics for Vancomycin, Aztreonam, and Levaquin ordered by EDP for sepsis.  Estimated Creatinine Clearance: 53.7 mL/min (by C-G formula based on SCr of 0.71 mg/dL).   Allergies  Allergen Reactions  . Lisinopril Anaphylaxis  . Quinine Other (See Comments)    Pt states heart attack-like symptoms  . Penicillins Swelling and Other (See Comments)    Patient states that she had swelling at the injection site after her last Penicillin shot but no swelling in the throat.  . Sulfonamide Derivatives Rash    Vitals:   08/08/17 1813 08/08/17 1833  BP:  (!) 164/58  Pulse:  93  Resp:  (!) 30  Temp:    SpO2: 94% 95%    Anti-infectives    Start     Dose/Rate Route Frequency Ordered Stop   08/08/17 1800  levofloxacin (LEVAQUIN) IVPB 750 mg     750 mg 100 mL/hr over 90 Minutes Intravenous  Once 08/08/17 1746     08/08/17 1800  aztreonam (AZACTAM) 2 g in dextrose 5 % 50 mL IVPB     2 g 100 mL/hr over 30 Minutes Intravenous  Once 08/08/17 1746 08/08/17 1839   08/08/17 1800  vancomycin (VANCOCIN) IVPB 1000 mg/200 mL premix     1,000 mg 200 mL/hr over 60 Minutes Intravenous  Once 08/08/17 1746        Plan: Initial doses of Vancomycin 1000mg , Aztreonam 2000mg , and Levaquin 750mg  X 1 ordered to be given in ED. F/U admission orders for further dosing if therapy continued.  Wayland DenisHall, Catharine Kettlewell A, [facility]rish Medical CenterRPH 08/08/2017 7:17 PM

## 2017-08-08 NOTE — H&P (Signed)
History and Physical    [name] RUE:[id number] DOB: [birth year] DOA: 08/08/2017  PCP: [name], MD  Patient coming from: home  Chief Complaint:   sob  HPI: [name] is a 83 y.o. female with medical history significant of CAD, HTN, CABG, complete heart block s/p pacemaker, p afib, MVR prev on coumadin which was stopped about 2 years ago from unknown bleed comes in with over a day of progressive worsening sob and doe.  Pt had right hip repair on 06/02/17 at [facility], went to rehab for 30 days and did very well.  She has been home doing well until about 2 weeks ago she started having lower back pain.  She saw her PCP and found a probable new T 9 compression fx on 8/6.  Since then she has had decreased mobilization and pain.  About a week ago her legs started to swell, at that time it was both of them.  About 4-5 days ago her left leg started swelling signifantly and it was becoming painful for her to walk.  The swelling in the leg has gone down a lot in the last day.   She has had no hemoptysis.  She was on full dose aspirin post op for dvt prophylaxis.  Pt found to have submassive PE and hypoxia.  Was referred for admission for treatment and closely monitoring.  She denies any pain in her chest except some left wrist pain for the last several days.   Review of Systems: As per HPI otherwise 10 point review of systems negative.   Past Medical History:  Diagnosis Date  . CAD (coronary artery disease)    a. s/p PCI to LAD '08, Severe 3V CAD by cath 03/27/12 --> CABG  . CHF (congestive heart failure) (HCC)   . Complete heart block (HCC)    a. s/p STJ dual chamber pacemaker  . Depression   . Hypertension   . Hypothyroidism   . Mitral regurgitation    moderate by cath, mild by echo 03/2012  . Osteoarthrosis, unspecified whether generalized or localized, unspecified site   . Paroxysmal atrial fibrillation (HCC)    a. identified s/p CABG b. OAC discontinued 02/2014 after no recurrence  .  Personal history of other diseases of digestive system   . Postoperative anemia due to acute blood loss 06/04/2017  . Type II or unspecified type diabetes mellitus without mention of complication, not stated as uncontrolled   . Unspecified glaucoma(365.9)   . Unspecified hemorrhoids without mention of complication     Past Surgical History:  Procedure Laterality Date  . CHOLECYSTECTOMY  12/2006  . CORONARY ARTERY BYPASS GRAFT  04/03/2012   Procedure: CORONARY ARTERY BYPASS GRAFTING (CABG);  Surgeon: Kerin Perna, MD;  Location: [facility] OR;  Service: Open Heart Surgery;  Laterality: N/A;  CABG x four;  using left internal mammary artery and bilateral greater saphenous veins  . ERCP  2008  . INTRAMEDULLARY (IM) NAIL INTERTROCHANTERIC Right 06/02/2017   Procedure: INTRAMEDULLARY (IM) NAIL INTERTROCHANTRIC RIGHT HIP;  Surgeon: Sheral Apley, MD;  Location: [facility] OR;  Service: Orthopedics;  Laterality: Right;  . LEFT HEART CATHETERIZATION WITH CORONARY ANGIOGRAM N/A 03/27/2012   Procedure: LEFT HEART CATHETERIZATION WITH CORONARY ANGIOGRAM;  Surgeon: Herby Abraham, MD;  Location: [facility] CATH LAB;  Service: Cardiovascular;  Laterality: N/A;  . MITRAL VALVE REPAIR  04/03/2012   Procedure: MITRAL VALVE REPAIR (MVR);  Surgeon: Kerin Perna, MD;  Location: [facility] OR;  Service: Open Heart  Surgery;  Laterality: N/A;  . PERMANENT PACEMAKER INSERTION N/A 02/26/2015   STJ Assurity dual chamber pacemaker implanted by Dr Johney Frame for symptomatic complete heart block     reports that she quit smoking about 12 years ago. Her smoking use included Cigarettes. She has never used smokeless tobacco. She reports that she does not drink alcohol or use drugs.  Allergies  Allergen Reactions  . Lisinopril Anaphylaxis  . Quinine Other (See Comments)    Pt states heart attack-like symptoms  . Penicillins Swelling and Other (See Comments)    Patient states that she had swelling at the injection site after her last Penicillin shot but  no swelling in the throat.  . Sulfonamide Derivatives Rash    Family History  Problem Relation Age of Onset  . Heart disease Mother   . Heart disease Father 51       CABG  . Arrhythmia Brother        slow HR off and on since his 68s    Prior to Admission medications   Medication Sig Start Date End Date Taking? Authorizing Provider  amLODipine (NORVASC) 5 MG tablet Take 5 mg by mouth daily. 05/23/15  Yes [provider]  carvedilol (COREG) 6.25 MG tablet TAKE ONE TABLET BY MOUTH TWICE DAILY 08/09/16  Yes Branch, Dorothe Pea, MD  FLUoxetine (PROZAC) 20 MG capsule Take 20 mg by mouth daily.    Yes [provider]  metFORMIN (GLUCOPHAGE) 500 MG tablet Take 500 mg by mouth daily with breakfast.  11/28/15  Yes [provider]  omeprazole (PRILOSEC) 20 MG capsule Take 20 mg by mouth daily.  12/11/10  Yes [provider]  oxycodone (OXY-IR) 5 MG capsule Take 5-10 mg by mouth every 6 (six) hours as needed for pain.   Yes [provider]  polyethylene glycol powder (MIRALAX) powder Take 17 g by mouth daily as needed.   Yes [provider]  aspirin EC 325 MG tablet Take 1 tablet (325 mg total) by mouth daily. For 30 days post op for DVT Prophylaxis Patient not taking: Reported on 08/08/2017 06/02/17   Albina Billet III, PA-C  docusate sodium (COLACE) 100 MG capsule Take 1 capsule (100 mg total) by mouth 2 (two) times daily. To prevent constipation while taking pain medication. Patient not taking: Reported on 08/08/2017 06/02/17   Albina Billet III, PA-C  feeding supplement, GLUCERNA SHAKE, (GLUCERNA SHAKE) LIQD Take 237 mLs by mouth 2 (two) times daily between meals. 06/04/17   Rolly Salter, MD  HYDROcodone-acetaminophen (NORCO) 5-325 MG tablet Take 1-2 tablets by mouth every 6 (six) hours as needed for moderate pain. Patient not taking: Reported on 08/08/2017 06/02/17   Albina Billet III, PA-C  INVOKANA 300 MG TABS tablet Take  300 mg by mouth daily. 01/28/16   [provider]    Physical Exam: Vitals:   08/08/17 2200 08/08/17 2230 08/08/17 2251 08/08/17 2328  BP: 135/64 120/64 120/64   Pulse: 82 91 81   Resp: (!) 22 19 (!) 22   Temp:    98.6 F (37 C)  TempSrc:    Oral  SpO2: 96% 95% 95%   Weight:    70.3 kg (154 lb 15.7 oz)  Height:    5\' 8"  (1.727 m)      Constitutional: NAD, calm, comfortable, tachypneic but can speak in full sentences and has good color Vitals:   08/08/17 2200 08/08/17 2230 08/08/17 2251 08/08/17 2328  BP: 135/64 120/64  120/64   Pulse: 82 91 81   Resp: (!) 22 19 (!) 22   Temp:    98.6 F (37 C)  TempSrc:    Oral  SpO2: 96% 95% 95%   Weight:    70.3 kg (154 lb 15.7 oz)  Height:    5\' 8"  (1.727 m)   Eyes: PERRL, lids and conjunctivae normal ENMT: Mucous membranes are moist. Posterior pharynx clear of any exudate or lesions.Normal dentition.  Neck: normal, supple, no masses, no thyromegaly Respiratory: clear to auscultation bilaterally, no wheezing, no crackles. Normal respiratory effort. No accessory muscle use.  Cardiovascular: Regular rate and rhythm, no murmurs / rubs / gallops. No extremity edema. 2+ pedal pulses. No carotid bruits.  Abdomen: no tenderness, no masses palpated. No hepatosplenomegaly. Bowel sounds positive.  Musculoskeletal: no clubbing / cyanosis. No joint deformity upper and lower extremities. Good ROM, no contractures. Normal muscle tone.  Skin: no rashes, lesions, ulcers. No induration Neurologic: CN 2-12 grossly intact. Sensation intact, DTR normal. Strength 5/5 in all 4.  Psychiatric: Normal judgment and insight. Alert and oriented x 3. Normal mood.    Labs on Admission: I have personally reviewed following labs and imaging studies  CBC:  Recent Labs Lab 08/08/17 1750  WBC 7.8  NEUTROABS 5.8  HGB 10.6*  HCT 35.8*  MCV 80.4  PLT 133*   Basic Metabolic Panel:  Recent Labs Lab 08/08/17 1750  NA 138  K 3.5  CL 102  CO2 26    GLUCOSE 146*  BUN 13  CREATININE 0.71  CALCIUM 8.9   GFR: Estimated Creatinine Clearance: 53.7 mL/min (by C-G formula based on SCr of 0.71 mg/dL). Liver Function Tests:  Recent Labs Lab 08/08/17 1750  AST 16  ALT 6*  ALKPHOS 70  BILITOT 0.7  PROT 7.0  ALBUMIN 3.4*   Coagulation Profile:  Recent Labs Lab 08/08/17 1750  INR 1.05   Cardiac Enzymes:  Recent Labs Lab 08/08/17 1750  TROPONINI <0.03    Urine analysis:    Component Value Date/Time   COLORURINE YELLOW 08/08/2017 1746   APPEARANCEUR CLEAR 08/08/2017 1746   LABSPEC 1.031 (H) 08/08/2017 1746   PHURINE 5.0 08/08/2017 1746   GLUCOSEU >=500 (A) 08/08/2017 1746   HGBUR NEGATIVE 08/08/2017 1746   BILIRUBINUR NEGATIVE 08/08/2017 1746   KETONESUR 20 (A) 08/08/2017 1746   PROTEINUR NEGATIVE 08/08/2017 1746   UROBILINOGEN 0.2 03/28/2012 0056   NITRITE NEGATIVE 08/08/2017 1746   LEUKOCYTESUR TRACE (A) 08/08/2017 1746    Recent Results (from the past 240 hour(s))  Blood Culture (routine x 2)     Status: None (Preliminary result)   Collection Time: 08/08/17  5:51 PM  Result Value Ref Range Status   Specimen Description RIGHT ANTECUBITAL  Final   Special Requests   Final    BOTTLES DRAWN AEROBIC AND ANAEROBIC Blood Culture adequate volume   Culture PENDING  Incomplete   Report Status PENDING  Incomplete  Blood Culture (routine x 2)     Status: None (Preliminary result)   Collection Time: 08/08/17  6:01 PM  Result Value Ref Range Status   Specimen Description BLOOD RIGHT ARM  Final   Special Requests   Final    BOTTLES DRAWN AEROBIC AND ANAEROBIC Blood Culture adequate volume   Culture PENDING  Incomplete   Report Status PENDING  Incomplete     Radiological Exams on Admission: Ct Angio Chest Pe W/cm &/or Wo Cm  Result Date: 08/08/2017 CLINICAL DATA:  Three day history of  worsening shortness of breath. EXAM: CT ANGIOGRAPHY CHEST WITH CONTRAST TECHNIQUE: Multidetector CT imaging of the chest was  performed using the standard protocol during bolus administration of intravenous contrast. Multiplanar CT image reconstructions and MIPs were obtained to evaluate the vascular anatomy. CONTRAST:  100 cc Isovue 370 COMPARISON:  None. FINDINGS: Cardiovascular: Heart is enlarged. Patient is status post CABG Atherosclerotic calcification is noted in the wall of the thoracic aorta. Acute pulmonary embolus is identified in the right main pulmonary artery and lobar and more distal arteries to the right upper and lower lobes. Small clot burden is identified and pulmonary arteries to the left upper and lower lobes in may be nonacute. Mediastinum/Nodes: 14 mm short axis subcarinal lymph node is associated with a 12 mm short axis lymph node in the right hilum and an 8 mm short axis left hilar lymph node. No axillary lymphadenopathy. Esophagus is unremarkable. Lungs/Pleura: Trace interlobular septal thickening is identified in the upper lobes. 4 mm subpleural nodule identified left upper lobe on image 26 series 6. No focal airspace consolidation. No overt pulmonary edema or pleural effusion. Compressive atelectasis noted posterior lower lobes bilaterally. Upper Abdomen: Unremarkable. Musculoskeletal: T9 compression fracture identified. Bones are diffusely osteopenic. Review of the MIP images confirms the above findings. IMPRESSION: 1. Acute pulmonary embolus identified in the right main pulmonary artery as well as lobar and more distal pulmonary arteries to the right upper and lower lobes. Although nonocclusive, the burden of thrombus in the right pulmonary artery is is relatively large. There is associated CT evidence of right heart strain (RV/LV Ratio = 1.1) consistent with at least submassive (intermediate risk) PE. The presence of right heart strain has been associated with an increased risk of morbidity and mortality. Please activate Code PE by paging [phone]. 2. There is trace string like embolus identified in  multiple segmental and subsegmental branches to the left lung which may be acute, but given the string like appearance, chronic pulmonary embolus in the left lung would also be a consideration. 3. Mild mediastinal and right hilar lymphadenopathy, indeterminate. Follow-up CT chest with contrast in 3 months recommended to reassess. 4. Osteopenia with T9 compression fracture. Critical Value/emergent results were called by telephone at the time of interpretation on 08/08/2017 at 8:12 pm to Dr. Vanetta Mulders , who verbally acknowledged these results. Electronically Signed   By: Kennith Center M.D.   On: 08/08/2017 20:12   Dg Chest Portable 1 View  Result Date: 08/08/2017 CLINICAL DATA:  Shortness of breath. EXAM: PORTABLE CHEST 1 VIEW COMPARISON:  06/01/2017 FINDINGS: Dual lead pacer. Reverse apical lordotic positioning. Numerous leads and wires project over the chest. Midline trachea. Cardiomegaly accentuated by AP portable technique. Possible trace left pleural fluid. No pneumothorax. Low lung volumes. Pulmonary interstitial prominence. Mild left base airspace disease. IMPRESSION: Cardiomegaly and low lung volumes. Suspect mild pulmonary venous congestion. Possible trace left pleural fluid with adjacent atelectasis. Electronically Signed   By: Jeronimo Greaves M.D.   On: 08/08/2017 17:59   Dg Hand Complete Left  Result Date: 08/08/2017 CLINICAL DATA:  Left hand pain. EXAM: LEFT HAND - COMPLETE 3+ VIEW COMPARISON:  None. FINDINGS: There is no evidence of fracture or dislocation. Mild diffuse osteoarthritis is seen involving the digits the wrist. No erosions or bony lesions identified. Soft tissues are unremarkable. IMPRESSION: Mild osteoarthritis of the hand wrist. Electronically Signed   By: Irish Lack M.D.   On: 08/08/2017 18:09    EKG: Independently reviewed. Paced Old chart reviewed Case discussed  with dr z  Assessment/Plan 83 yo female comes in with sob, acute hypoxic respiratory failure found to  have submassive pulmonary emboli  Principal Problem:   Pulmonary emboli (HCC)- heparin drip.  Oxygen supplementation.  Will likely need to be in icu for 24-48 hours for close monitoring.  Obtain cardiac echo.  prob provoked by recent immobilation.  No prior h/o VTE.  Will need one year of anticoagulation due to clot burden.    Active Problems:   Acute respiratory failure with hypoxia (HCC)- as above   Essential hypertension- holding all bp meds at this time in setting of acute PE at least for first 24 hours   Coronary artery disease- noted   S/P MVR (mitral valve repair)- noted   Ejection fraction < 50%- noted   S/P CABG x 4- noted   Complete heart block (HCC) s/p pacer    Pacemaker- noted     DVT prophylaxis:  Heparin drip Code Status:  Full code Family Communication:  2 daughters  Disposition Plan:  Per day team Consults called:  none Admission status:  admission  Pt seen before midnight   DAVID,RACHAL A MD [facility]  If 7PM-7AM, please contact night-coverage [email or web address] Password [email or web address]  08/08/2017, 11:45 PM

## 2017-08-09 ENCOUNTER — Inpatient Hospital Stay (HOSPITAL_COMMUNITY): Payer: Medicare HMO

## 2017-08-09 DIAGNOSIS — J9601 Acute respiratory failure with hypoxia: Secondary | ICD-10-CM

## 2017-08-09 DIAGNOSIS — Z7189 Other specified counseling: Secondary | ICD-10-CM

## 2017-08-09 DIAGNOSIS — I351 Nonrheumatic aortic (valve) insufficiency: Secondary | ICD-10-CM

## 2017-08-09 DIAGNOSIS — D509 Iron deficiency anemia, unspecified: Secondary | ICD-10-CM

## 2017-08-09 DIAGNOSIS — Z950 Presence of cardiac pacemaker: Secondary | ICD-10-CM

## 2017-08-09 DIAGNOSIS — I10 Essential (primary) hypertension: Secondary | ICD-10-CM

## 2017-08-09 DIAGNOSIS — I429 Cardiomyopathy, unspecified: Secondary | ICD-10-CM

## 2017-08-09 DIAGNOSIS — M25532 Pain in left wrist: Secondary | ICD-10-CM

## 2017-08-09 DIAGNOSIS — I25708 Atherosclerosis of coronary artery bypass graft(s), unspecified, with other forms of angina pectoris: Secondary | ICD-10-CM

## 2017-08-09 DIAGNOSIS — Z9889 Other specified postprocedural states: Secondary | ICD-10-CM

## 2017-08-09 LAB — CBC
HCT: 32.8 % — ABNORMAL LOW (ref 36.0–46.0)
HEMOGLOBIN: 10 g/dL — AB (ref 12.0–15.0)
MCH: 24.3 pg — ABNORMAL LOW (ref 26.0–34.0)
MCHC: 30.5 g/dL (ref 30.0–36.0)
MCV: 79.6 fL (ref 78.0–100.0)
Platelets: 131 10*3/uL — ABNORMAL LOW (ref 150–400)
RBC: 4.12 MIL/uL (ref 3.87–5.11)
RDW: 16.8 % — ABNORMAL HIGH (ref 11.5–15.5)
WBC: 7.9 10*3/uL (ref 4.0–10.5)

## 2017-08-09 LAB — ECHOCARDIOGRAM COMPLETE
AOPV: 0.56 m/s
AOVTI: 31.8 cm
AV Area VTI: 1.77 cm2
AV Area mean vel: 1.7 cm2
AV Peak grad: 12 mmHg
AV area mean vel ind: 0.92 cm2/m2
AV peak Index: 0.96
AV vel: 1.91
AVA: 1.91 cm2
AVAREAVTIIND: 1.04 cm2/m2
AVCELMEANRAT: 0.54
AVG: 7 mmHg
AVLVOTPG: 4 mmHg
AVPKVEL: 174 cm/s
CHL CUP AV VALUE AREA INDEX: 1.04
CHL CUP DOP CALC LVOT VTI: 19.3 cm
CHL CUP LVOT MV VTI INDEX: 0.57 cm2/m2
CHL CUP RV SYS PRESS: 38 mmHg
CHL CUP STROKE VOLUME: 58 mL
DOP CAL AO MEAN VELOCITY: 121 cm/s
EWDT: 187 ms
FS: 29 % (ref 28–44)
Height: 68 in
IV/PV OW: 1.32
LA ID, A-P, ES: 35 mm
LA diam index: 1.9 cm/m2
LA vol A4C: 55.2 ml
LA vol index: 27.9 mL/m2
LA vol: 51.4 mL
LEFT ATRIUM END SYS DIAM: 35 mm
LV PW d: 11.1 mm — AB (ref 0.6–1.1)
LV SIMPSON'S DISK: 71
LV dias vol index: 44 mL/m2
LVDIAVOL: 82 mL (ref 46–106)
LVOT MV VTI: 1.05
LVOT area: 3.14 cm2
LVOT diameter: 20 mm
LVOT peak VTI: 0.61 cm
LVOTPV: 98.3 cm/s
LVOTSV: 61 mL
LVSYSVOL: 24 mL (ref 14–42)
LVSYSVOLIN: 13 mL/m2
Lateral S' vel: 8.7 cm/s
MV Annulus VTI: 57.6 cm
MV Dec: 187
MV M vel: 86.1
MV pk A vel: 150 m/s
MV pk E vel: 174 m/s
MVAP: 1.93 cm2
MVPG: 12 mmHg
MVSPHT: 137 ms
Mean grad: 5 mmHg
P 1/2 time: 419 ms
Reg peak vel: 295 cm/s
TAPSE: 15.5 mm
TR max vel: 295 cm/s
Weight: 2479.73 oz

## 2017-08-09 LAB — BASIC METABOLIC PANEL
ANION GAP: 12 (ref 5–15)
BUN: 11 mg/dL (ref 6–20)
CHLORIDE: 103 mmol/L (ref 101–111)
CO2: 21 mmol/L — ABNORMAL LOW (ref 22–32)
Calcium: 8.4 mg/dL — ABNORMAL LOW (ref 8.9–10.3)
Creatinine, Ser: 0.59 mg/dL (ref 0.44–1.00)
GFR calc Af Amer: >60 mL/min (ref >60)
GLUCOSE: 119 mg/dL — AB (ref 65–99)
POTASSIUM: 2.9 mmol/L — AB (ref 3.5–5.1)
Sodium: 136 mmol/L (ref 135–145)

## 2017-08-09 LAB — GLUCOSE, CAPILLARY
GLUCOSE-CAPILLARY: 103 mg/dL — AB (ref 65–99)
GLUCOSE-CAPILLARY: 142 mg/dL — AB (ref 65–99)
GLUCOSE-CAPILLARY: 114 mg/dL — AB (ref 65–99)

## 2017-08-09 LAB — HEPARIN LEVEL (UNFRACTIONATED): Heparin Unfractionated: 0.52 IU/mL (ref 0.30–0.70)

## 2017-08-09 LAB — FERRITIN: FERRITIN: 48 ng/mL (ref 11–307)

## 2017-08-09 LAB — MRSA PCR SCREENING: MRSA BY PCR: NEGATIVE

## 2017-08-09 MED ORDER — DEXTROSE 5 % IV SOLN
INTRAVENOUS | Status: AC
  Filled 2017-08-09: qty 1

## 2017-08-09 MED ORDER — VANCOMYCIN HCL 500 MG IV SOLR
INTRAVENOUS | Status: AC
  Filled 2017-08-09: qty 500

## 2017-08-09 MED ORDER — ZOLPIDEM TARTRATE 5 MG PO TABS
5.0000 mg | ORAL_TABLET | Freq: Once | ORAL | Status: AC
  Administered 2017-08-09: 5 mg via ORAL
  Filled 2017-08-09: qty 1

## 2017-08-09 MED ORDER — APIXABAN 5 MG PO TABS: 10.0000 mg | ORAL_TABLET | Freq: Two times a day (BID) | ORAL | Status: DC

## 2017-08-09 MED ORDER — DEXTROSE 5 % IV SOLN
1.0000 g | Freq: Three times a day (TID) | INTRAVENOUS | Status: DC
  Administered 2017-08-09 – 2017-08-16 (×20): 1 g via INTRAVENOUS
  Filled 2017-08-09 (×24): qty 1

## 2017-08-09 MED ORDER — POTASSIUM CHLORIDE 10 MEQ/100ML IV SOLN
10.0000 meq | INTRAVENOUS | Status: AC
  Administered 2017-08-09 (×4): 10 meq via INTRAVENOUS
  Filled 2017-08-09 (×4): qty 100

## 2017-08-09 MED ORDER — VANCOMYCIN HCL 500 MG IV SOLR
500.0000 mg | Freq: Two times a day (BID) | INTRAVENOUS | Status: DC
  Administered 2017-08-09 – 2017-08-14 (×11): 500 mg via INTRAVENOUS
  Filled 2017-08-09 (×12): qty 500

## 2017-08-09 MED ORDER — APIXABAN 5 MG PO TABS: 5.0000 mg | ORAL_TABLET | Freq: Two times a day (BID) | ORAL | Status: DC

## 2017-08-09 MED ORDER — POLYETHYLENE GLYCOL 3350 17 G PO PACK
17.0000 g | PACK | Freq: Every day | ORAL | Status: DC | PRN
  Administered 2017-08-15: 17 g via ORAL
  Filled 2017-08-09: qty 1

## 2017-08-09 MED ORDER — RIVAROXABAN 15 MG PO TABS
15.0000 mg | ORAL_TABLET | Freq: Two times a day (BID) | ORAL | Status: DC
  Administered 2017-08-09 – 2017-08-16 (×15): 15 mg via ORAL
  Filled 2017-08-09 (×15): qty 1

## 2017-08-09 NOTE — Progress Notes (Signed)
*  PRELIMINARY RESULTS* Echocardiogram 2D Echocardiogram has been performed.  Stacey DrainWhite, Kamal Jurgens J 08/09/2017, 3:45 PM

## 2017-08-09 NOTE — Progress Notes (Addendum)
ANTICOAGULATION CONSULT NOTE - follow up  Pharmacy Consult for HEPARIN >> Xarelto Indication: pulmonary embolus  Allergies  Allergen Reactions  . Lisinopril Anaphylaxis  . Quinine Other (See Comments)    Pt states heart attack-like symptoms  . Penicillins Swelling and Other (See Comments)    Patient states that she had swelling at the injection site after her last Penicillin shot but no swelling in the throat.  . Sulfonamide Derivatives Rash   Patient Measurements: Height: 5\' 8"  (172.7 cm) Weight: 154 lb 15.7 oz (70.3 kg) IBW/kg (Calculated) : 63.9  Vital Signs: Temp: 99.2 F (37.3 C) (08/14 1133) Temp Source: Oral (08/14 1133) BP: 126/74 (08/14 1000) Pulse Rate: 83 (08/14 1133)  Labs:  Recent Labs  08/08/17 1750 08/09/17 0426  HGB 10.6* 10.0*  HCT 35.8* 32.8*  PLT 133* 131*  APTT 33  --   LABPROT 13.7  --   INR 1.05  --   HEPARINUNFRC  --  0.52  CREATININE 0.71 0.59  TROPONINI <0.03  --    Estimated Creatinine Clearance: 53.7 mL/min (by C-G formula based on SCr of 0.59 mg/dL).  Medical History: Past Medical History:  Diagnosis Date  . CAD (coronary artery disease)    a. s/p PCI to LAD '08, Severe 3V CAD by cath 03/27/12 --> CABG  . CHF (congestive heart failure) (HCC)   . Complete heart block (HCC)    a. s/p STJ dual chamber pacemaker  . Depression   . Hypertension   . Hypothyroidism   . Mitral regurgitation    moderate by cath, mild by echo 03/2012  . Osteoarthrosis, unspecified whether generalized or localized, unspecified site   . Paroxysmal atrial fibrillation (HCC)    a. identified s/p CABG b. OAC discontinued 02/2014 after no recurrence  . Personal history of other diseases of digestive system   . Postoperative anemia due to acute blood loss 06/04/2017  . Type II or unspecified type diabetes mellitus without mention of complication, not stated as uncontrolled   . Unspecified glaucoma(365.9)   . Unspecified hemorrhoids without mention of complication     Medications:  Prescriptions Prior to Admission  Medication Sig Dispense Refill Last Dose  . amLODipine (NORVASC) 5 MG tablet Take 5 mg by mouth daily.  5 08/08/2017 at Unknown time  . carvedilol (COREG) 6.25 MG tablet TAKE ONE TABLET BY MOUTH TWICE DAILY 60 tablet 11 08/08/2017 at 800a  . FLUoxetine (PROZAC) 20 MG capsule Take 20 mg by mouth daily.    08/08/2017 at Unknown time  . metFORMIN (GLUCOPHAGE) 500 MG tablet Take 500 mg by mouth daily with breakfast.   3 08/08/2017 at Unknown time  . omeprazole (PRILOSEC) 20 MG capsule Take 20 mg by mouth daily.    08/08/2017 at Unknown time  . oxycodone (OXY-IR) 5 MG capsule Take 5-10 mg by mouth every 6 (six) hours as needed for pain.   08/08/2017 at Unknown time  . polyethylene glycol powder (MIRALAX) powder Take 17 g by mouth daily as needed.   Past Week at Unknown time  . aspirin EC 325 MG tablet Take 1 tablet (325 mg total) by mouth daily. For 30 days post op for DVT Prophylaxis (Patient not taking: Reported on 08/08/2017) 30 tablet 0 Not Taking at Unknown time  . docusate sodium (COLACE) 100 MG capsule Take 1 capsule (100 mg total) by mouth 2 (two) times daily. To prevent constipation while taking pain medication. (Patient not taking: Reported on 08/08/2017) 60 capsule 0 Not Taking at Unknown  time  . feeding supplement, GLUCERNA SHAKE, (GLUCERNA SHAKE) LIQD Take 237 mLs by mouth 2 (two) times daily between meals. 30 Can 0   . HYDROcodone-acetaminophen (NORCO) 5-325 MG tablet Take 1-2 tablets by mouth every 6 (six) hours as needed for moderate pain. (Patient not taking: Reported on 08/08/2017) 40 tablet 0 Not Taking at Unknown time  . INVOKANA 300 MG TABS tablet Take 300 mg by mouth daily.  5 06/01/2017 at Unknown time   Assessment: 83yo female c/o SOB.  Pt has recently had surgery for hip replacement.   Pt has been on Heparin, now asked to transition to Xarelto  Goal of Therapy:  Heparin level 0.3-0.7 units/ml Monitor platelets by anticoagulation  protocol: Yes   Plan:   Xarelto 15mg  PO BID x 21 days then 20mg  po daily  Provide education  Monitor for s/sx bleeding   Valrie HartHall, Jourdyn Ferrin A 08/09/2017,3:06 PM

## 2017-08-09 NOTE — Progress Notes (Signed)
ANTICOAGULATION CONSULT NOTE - follow up  Pharmacy Consult for HEPARIN Indication: pulmonary embolus  Allergies  Allergen Reactions  . Lisinopril Anaphylaxis  . Quinine Other (See Comments)    Pt states heart attack-like symptoms  . Penicillins Swelling and Other (See Comments)    Patient states that she had swelling at the injection site after her last Penicillin shot but no swelling in the throat.  . Sulfonamide Derivatives Rash   Patient Measurements: Height: 5\' 8"  (172.7 cm) Weight: 154 lb 15.7 oz (70.3 kg) IBW/kg (Calculated) : 63.9  Vital Signs: Temp: 99.1 F (37.3 C) (08/14 0713) Temp Source: Oral (08/14 0713) BP: 131/58 (08/14 0500) Pulse Rate: 85 (08/14 0713)  Labs:  Recent Labs  08/08/17 1750 08/09/17 0426  HGB 10.6* 10.0*  HCT 35.8* 32.8*  PLT 133* 131*  APTT 33  --   LABPROT 13.7  --   INR 1.05  --   HEPARINUNFRC  --  0.52  CREATININE 0.71 0.59  TROPONINI <0.03  --    Estimated Creatinine Clearance: 53.7 mL/min (by C-G formula based on SCr of 0.59 mg/dL).  Medical History: Past Medical History:  Diagnosis Date  . CAD (coronary artery disease)    a. s/p PCI to LAD '08, Severe 3V CAD by cath 03/27/12 --> CABG  . CHF (congestive heart failure) (HCC)   . Complete heart block (HCC)    a. s/p STJ dual chamber pacemaker  . Depression   . Hypertension   . Hypothyroidism   . Mitral regurgitation    moderate by cath, mild by echo 03/2012  . Osteoarthrosis, unspecified whether generalized or localized, unspecified site   . Paroxysmal atrial fibrillation (HCC)    a. identified s/p CABG b. OAC discontinued 02/2014 after no recurrence  . Personal history of other diseases of digestive system   . Postoperative anemia due to acute blood loss 06/04/2017  . Type II or unspecified type diabetes mellitus without mention of complication, not stated as uncontrolled   . Unspecified glaucoma(365.9)   . Unspecified hemorrhoids without mention of complication     Medications:  Prescriptions Prior to Admission  Medication Sig Dispense Refill Last Dose  . amLODipine (NORVASC) 5 MG tablet Take 5 mg by mouth daily.  5 08/08/2017 at Unknown time  . carvedilol (COREG) 6.25 MG tablet TAKE ONE TABLET BY MOUTH TWICE DAILY 60 tablet 11 08/08/2017 at 800a  . FLUoxetine (PROZAC) 20 MG capsule Take 20 mg by mouth daily.    08/08/2017 at Unknown time  . metFORMIN (GLUCOPHAGE) 500 MG tablet Take 500 mg by mouth daily with breakfast.   3 08/08/2017 at Unknown time  . omeprazole (PRILOSEC) 20 MG capsule Take 20 mg by mouth daily.    08/08/2017 at Unknown time  . oxycodone (OXY-IR) 5 MG capsule Take 5-10 mg by mouth every 6 (six) hours as needed for pain.   08/08/2017 at Unknown time  . polyethylene glycol powder (MIRALAX) powder Take 17 g by mouth daily as needed.   Past Week at Unknown time  . aspirin EC 325 MG tablet Take 1 tablet (325 mg total) by mouth daily. For 30 days post op for DVT Prophylaxis (Patient not taking: Reported on 08/08/2017) 30 tablet 0 Not Taking at Unknown time  . docusate sodium (COLACE) 100 MG capsule Take 1 capsule (100 mg total) by mouth 2 (two) times daily. To prevent constipation while taking pain medication. (Patient not taking: Reported on 08/08/2017) 60 capsule 0 Not Taking at Unknown time  .  feeding supplement, GLUCERNA SHAKE, (GLUCERNA SHAKE) LIQD Take 237 mLs by mouth 2 (two) times daily between meals. 30 Can 0   . HYDROcodone-acetaminophen (NORCO) 5-325 MG tablet Take 1-2 tablets by mouth every 6 (six) hours as needed for moderate pain. (Patient not taking: Reported on 08/08/2017) 40 tablet 0 Not Taking at Unknown time  . INVOKANA 300 MG TABS tablet Take 300 mg by mouth daily.  5 06/01/2017 at Unknown time   Assessment: 83yo female c/o SOB.  Pt has recently had surgery for hip replacement.  Asked to initiate Heparin for PE.  Heparin level is therapeutic.    Goal of Therapy:  Heparin level 0.3-0.7 units/ml Monitor platelets by anticoagulation  protocol: Yes   Plan:   Continue Heparin infusion at 1100 units/hr  Heparin level daily  CBC daily while on Heparin   Valrie Hart A 08/09/2017,7:41 AM

## 2017-08-09 NOTE — Plan of Care (Signed)
Problem: Safety: Goal: Ability to remain free from injury will improve Outcome: Progressing Patient understands need to use call bell when she needs to get up due to Heparin drip and generalized weakness. Bed alarm on.

## 2017-08-09 NOTE — Progress Notes (Signed)
Patient experienced 28 beats of nonsustained Vtach (HR 180). Pt is asymptomatic. Stated she felt fine. BP afterwards 143/71. HR now in the 80's.  Will continue to monitor  [name] D Collier Monica, RN

## 2017-08-09 NOTE — Progress Notes (Signed)
Pt requesting something to help her rest. Paged MD on call.

## 2017-08-09 NOTE — Consult Note (Addendum)
Cardiology Consultation:   Patient ID: [name]; [id number]; 13-Jan-1932   Admit date: 08/08/2017 Date of Consult: 08/09/2017  Primary Care Provider: [name], MD Primary Cardiologist: Wyline Mood Primary Electrophysiologist:  Ladona Ridgel   Patient Profile:   [name] is a 83 y.o. female with a hx of CAD/CABG, chronic systolic heart failure, mitral regurgitation s/p repair, and pacemaker for complete heart block who is being seen today for the evaluation of pulmonary embolism at the request of Dr. Janna Arch.  History of Present Illness:   [name] is a 83 y.o. female with a hx of CAD/CABG, chronic systolic heart failure, mitral regurgitation s/p repair, and pacemaker for complete heart block.  She underwent surgery for closed right hip fracture in 05/2017 and was discharged on ASA 325 mg for DVT prophylaxis. She had some post-operative anemia and was given 1 unit PRBC transfusion. She then went to rehab for 30 days.  She then went home and developed lower back pain and was found to have a T9 compression fracture. Since then, she has had decreased mobility.  She had some post-operative atrial fibrillation at the time of cardiac surgery and had been on warfarin but it was subsequently stopped when she developed severe anemia.  She presented to the ED with increasing exertional dyspnea and a 4-5 day h/o progressive left leg swelling and was found to have submassive pulmonary embolism of the right main PA as well as lobar and more distal pulmonary arteries. There also appeared to be more chronic pulmonary emboli in the left lung.  Upon arrival to ED, BP 135/64, HR 82, sats in mid 80% range which improved to low to mid 90% range with 2L oxygen by nasal cannula.  At present, apixaban has been ordered for anticoagulation.  Hgb 10.6 on 08/08/17.  ECG on 08/08/17 showed a paced rhythm.  She denies chest pain. Shortness of breath has improved.  Primary complaint relates to left  wrist pain and tenderness. Xrays showed osteoarthritic changes with no bony erosions and no significant swelling.  She says home nurse said temp was 100.9.  Granddaughter, great-granddaughter, and great great granddaughter present in the room.   Past Medical History:  Diagnosis Date  . CAD (coronary artery disease)    a. s/p PCI to LAD '08, Severe 3V CAD by cath 03/27/12 --> CABG  . CHF (congestive heart failure) (HCC)   . Complete heart block (HCC)    a. s/p STJ dual chamber pacemaker  . Depression   . Hypertension   . Hypothyroidism   . Mitral regurgitation    moderate by cath, mild by echo 03/2012  . Osteoarthrosis, unspecified whether generalized or localized, unspecified site   . Paroxysmal atrial fibrillation (HCC)    a. identified s/p CABG b. OAC discontinued 02/2014 after no recurrence  . Personal history of other diseases of digestive system   . Postoperative anemia due to acute blood loss 06/04/2017  . Type II or unspecified type diabetes mellitus without mention of complication, not stated as uncontrolled   . Unspecified glaucoma(365.9)   . Unspecified hemorrhoids without mention of complication     Past Surgical History:  Procedure Laterality Date  . CHOLECYSTECTOMY  12/2006  . CORONARY ARTERY BYPASS GRAFT  04/03/2012   Procedure: CORONARY ARTERY BYPASS GRAFTING (CABG);  Surgeon: Kerin Perna, MD;  Location: [facility] OR;  Service: Open Heart Surgery;  Laterality: N/A;  CABG x four;  using left internal mammary artery and bilateral greater saphenous veins  .  ERCP  2008  . INTRAMEDULLARY (IM) NAIL INTERTROCHANTERIC Right 06/02/2017   Procedure: INTRAMEDULLARY (IM) NAIL INTERTROCHANTRIC RIGHT HIP;  Surgeon: Sheral Apley, MD;  Location: [facility] OR;  Service: Orthopedics;  Laterality: Right;  . LEFT HEART CATHETERIZATION WITH CORONARY ANGIOGRAM N/A 03/27/2012   Procedure: LEFT HEART CATHETERIZATION WITH CORONARY ANGIOGRAM;  Surgeon: Herby Abraham, MD;  Location: [facility] CATH LAB;  Service:  Cardiovascular;  Laterality: N/A;  . MITRAL VALVE REPAIR  04/03/2012   Procedure: MITRAL VALVE REPAIR (MVR);  Surgeon: Kerin Perna, MD;  Location: [facility] OR;  Service: Open Heart Surgery;  Laterality: N/A;  . PERMANENT PACEMAKER INSERTION N/A 02/26/2015   STJ Assurity dual chamber pacemaker implanted by Dr Johney Frame for symptomatic complete heart block     Inpatient Medications: Scheduled Meds: . apixaban  10 mg Oral BID   Followed by  . [START ON 08/16/2017] apixaban  5 mg Oral BID  . docusate sodium  100 mg Oral BID  . FLUoxetine  20 mg Oral Daily  . insulin aspart  0-9 Units Subcutaneous TID WC  . pantoprazole  40 mg Oral Daily  . sodium chloride flush  3 mL Intravenous Q12H   Continuous Infusions: . sodium chloride    . potassium chloride     PRN Meds: sodium chloride, fentaNYL (SUBLIMAZE) injection, ondansetron **OR** ondansetron (ZOFRAN) IV, oxyCODONE, polyethylene glycol, sodium chloride flush, traMADol  Allergies:    Allergies  Allergen Reactions  . Lisinopril Anaphylaxis  . Quinine Other (See Comments)    Pt states heart attack-like symptoms  . Penicillins Swelling and Other (See Comments)    Patient states that she had swelling at the injection site after her last Penicillin shot but no swelling in the throat.  . Sulfonamide Derivatives Rash    Social History:   Social History   Social History  . Marital status: Widowed    Spouse name: N/A  . Number of children: N/A  . Years of education: N/A   Occupational History  . Not on file.   Social History Main Topics  . Smoking status: Former Smoker    Types: Cigarettes    Quit date: 09/03/2004  . Smokeless tobacco: Never Used  . Alcohol use No  . Drug use: No  . Sexual activity: Not on file   Other Topics Concern  . Not on file   Social History Narrative  . No narrative on file    Family History:   No family h/o premature CAD.  Family History  Problem Relation Age of Onset  . Heart disease Mother   .  Heart disease Father 45       CABG  . Arrhythmia Brother        slow HR off and on since his 20s     ROS:  Please see the history of present illness.  ROS  All other ROS reviewed and negative.     Physical Exam/Data:   Vitals:   08/09/17 0800 08/09/17 0900 08/09/17 1000 08/09/17 1133  BP: (!) 130/55 125/61 126/74   Pulse: 85 87 85 83  Resp: (!) 24 (!) 21 20 19   Temp:    99.2 F (37.3 C)  TempSrc:    Oral  SpO2: 93% 92% 93% 93%  Weight:      Height:        Intake/Output Summary (Last 24 hours) at 08/09/17 1504 Last data filed at 08/09/17 0500  Gross per 24 hour  Intake  236.53 ml  Output              400 ml  Net          -163.47 ml   Filed Weights   08/08/17 1726 08/08/17 2328 08/09/17 0500  Weight: 148 lb (67.1 kg) 154 lb 15.7 oz (70.3 kg) 154 lb 15.7 oz (70.3 kg)   Body mass index is 23.57 kg/m.  General:  Well nourished, well developed, in no acute distress HEENT: normal Lymph: no adenopathy Neck: no JVD Endocrine:  No thryomegaly Cardiac:  normal S1, S2; RRR; no murmur  Lungs:  clear to auscultation bilaterally, no wheezing, rhonchi or rales  Abd: soft, nontender, no hepatomegaly  Ext: no edema Musculoskeletal:  No deformities, BUE and BLE strength normal and equal. Left wrist tender to palpation. Skin: warm and dry  Neuro:  CNs 2-12 intact, no focal abnormalities noted Psych:  Normal affect    Relevant CV Studies: Echocardiogram pending  Laboratory Data:  Chemistry Recent Labs Lab 08/08/17 1750 08/09/17 0426  NA 138 136  K 3.5 2.9*  CL 102 103  CO2 26 21*  GLUCOSE 146* 119*  BUN 13 11  CREATININE 0.71 0.59  CALCIUM 8.9 8.4*  GFRNONAA >60 >60  GFRAA >60 >60  ANIONGAP 10 12     Recent Labs Lab 08/08/17 1750  PROT 7.0  ALBUMIN 3.4*  AST 16  ALT 6*  ALKPHOS 70  BILITOT 0.7   Hematology Recent Labs Lab 08/08/17 1750 08/09/17 0426  WBC 7.8 7.9  RBC 4.45 4.12  HGB 10.6* 10.0*  HCT 35.8* 32.8*  MCV 80.4 79.6  MCH  23.8* 24.3*  MCHC 29.6* 30.5  RDW 17.0* 16.8*  PLT 133* 131*   Cardiac Enzymes Recent Labs Lab 08/08/17 1750  TROPONINI <0.03   No results for input(s): TROPIPOC in the last 168 hours.  BNPNo results for input(s): BNP, PROBNP in the last 168 hours.  DDimer No results for input(s): DDIMER in the last 168 hours.  Radiology/Studies:  Ct Angio Chest Pe W/cm &/or Wo Cm  Result Date: 08/08/2017 CLINICAL DATA:  Three day history of worsening shortness of breath. EXAM: CT ANGIOGRAPHY CHEST WITH CONTRAST TECHNIQUE: Multidetector CT imaging of the chest was performed using the standard protocol during bolus administration of intravenous contrast. Multiplanar CT image reconstructions and MIPs were obtained to evaluate the vascular anatomy. CONTRAST:  100 cc Isovue 370 COMPARISON:  None. FINDINGS: Cardiovascular: Heart is enlarged. Patient is status post CABG Atherosclerotic calcification is noted in the wall of the thoracic aorta. Acute pulmonary embolus is identified in the right main pulmonary artery and lobar and more distal arteries to the right upper and lower lobes. Small clot burden is identified and pulmonary arteries to the left upper and lower lobes in may be nonacute. Mediastinum/Nodes: 14 mm short axis subcarinal lymph node is associated with a 12 mm short axis lymph node in the right hilum and an 8 mm short axis left hilar lymph node. No axillary lymphadenopathy. Esophagus is unremarkable. Lungs/Pleura: Trace interlobular septal thickening is identified in the upper lobes. 4 mm subpleural nodule identified left upper lobe on image 26 series 6. No focal airspace consolidation. No overt pulmonary edema or pleural effusion. Compressive atelectasis noted posterior lower lobes bilaterally. Upper Abdomen: Unremarkable. Musculoskeletal: T9 compression fracture identified. Bones are diffusely osteopenic. Review of the MIP images confirms the above findings. IMPRESSION: 1. Acute pulmonary embolus  identified in the right main pulmonary artery as well as lobar and more  distal pulmonary arteries to the right upper and lower lobes. Although nonocclusive, the burden of thrombus in the right pulmonary artery is is relatively large. There is associated CT evidence of right heart strain (RV/LV Ratio = 1.1) consistent with at least submassive (intermediate risk) PE. The presence of right heart strain has been associated with an increased risk of morbidity and mortality. Please activate Code PE by paging [phone]. 2. There is trace string like embolus identified in multiple segmental and subsegmental branches to the left lung which may be acute, but given the string like appearance, chronic pulmonary embolus in the left lung would also be a consideration. 3. Mild mediastinal and right hilar lymphadenopathy, indeterminate. Follow-up CT chest with contrast in 3 months recommended to reassess. 4. Osteopenia with T9 compression fracture. Critical Value/emergent results were called by telephone at the time of interpretation on 08/08/2017 at 8:12 pm to Dr. Vanetta Mulders , who verbally acknowledged these results. Electronically Signed   By: Kennith Center M.D.   On: 08/08/2017 20:12   Dg Chest Portable 1 View  Result Date: 08/08/2017 CLINICAL DATA:  Shortness of breath. EXAM: PORTABLE CHEST 1 VIEW COMPARISON:  06/01/2017 FINDINGS: Dual lead pacer. Reverse apical lordotic positioning. Numerous leads and wires project over the chest. Midline trachea. Cardiomegaly accentuated by AP portable technique. Possible trace left pleural fluid. No pneumothorax. Low lung volumes. Pulmonary interstitial prominence. Mild left base airspace disease. IMPRESSION: Cardiomegaly and low lung volumes. Suspect mild pulmonary venous congestion. Possible trace left pleural fluid with adjacent atelectasis. Electronically Signed   By: Jeronimo Greaves M.D.   On: 08/08/2017 17:59   Dg Hand Complete Left  Result Date: 08/08/2017 CLINICAL DATA:   Left hand pain. EXAM: LEFT HAND - COMPLETE 3+ VIEW COMPARISON:  None. FINDINGS: There is no evidence of fracture or dislocation. Mild diffuse osteoarthritis is seen involving the digits the wrist. No erosions or bony lesions identified. Soft tissues are unremarkable. IMPRESSION: Mild osteoarthritis of the hand wrist. Electronically Signed   By: Irish Lack M.D.   On: 08/08/2017 18:09    Assessment and Plan:   1. Acute submassive pulmonary embolism of right main PA and distal vessels with more chronic appearing pulmonary emboli in left lung: She has a h/o anemia of uncertain etiology in the past. Had been on warfarin years ago for post-operative atrial fibrillation. No recent EGD or colonoscopy. Apixaban has been ordered. I will switch to Xarelto given that there is now a reversal agent available at [facility] for this. NOAC's are preferred over vitamin K antagonists for the treatment of PE.  This appears to be provoked (prolonged immobility after surgery and then due to T9 compression fracture); hence, they typical length of therapy would be 3 months. However, given the chronic appearing left pulmonary emboli, one could argue for a longer duration of therapy.  She will need outpatient EGD and colonoscopy to evaluate for a GI source of anemia, this being the most common etiology. I will check a serum ferritin level.  Echocardiogram shows mobile masses attached to pacer wire and given clinical context, this likely represents thrombus in transit. However, vegetations cannot entirely be ruled out. WBC is normal and blood cultures show no growth so endocarditis less likely. Consider TEE if deemed clinically indicated.  2. Left wrist pain: This appears to be osteoarthritic but I she may have superimposed gout. I will check a serum urate level.  3. CAD with CABG: Symptomatically stable. Coreg currently on hold. Would not give ASA given  need for anticoagulation and h/o anemia/bleed. Not on statins due to  myalgias.  4. Anemia: She will need outpatient EGD and colonoscopy to evaluate for a GI source of anemia, this being the most common etiology. I will check a serum ferritin level. Likely iron deficiency given elevated RDW and low normal MCV.  5. Pacemaker for CHB: Stable with normal pacemaker function. Echocardiogram shows mobile masses attached to pacer wire and given clinical context, this likely represents thrombus in transit. However, vegetations cannot entirely be ruled out. WBC is normal and blood cultures show no growth so endocarditis less likely. Consider TEE if deemed clinically indicated.  6. Chronic systolic heart failure: Echocardiogram shows normalization of LV systolic function, EF 55-60%. No indication for diuretics at this time. Coreg on hold. No ACEI due to h/o angioedema.  7. Hypertension: BP normal. Coreg on hold.  7. S/p mitral valve repair: Echo shows durable valve repair with only trivial regurgitation.  Time spent: 75 minutes.    Signed, Prentice DockerSuresh Altin Sease, MD  08/09/2017 3:04 PM

## 2017-08-09 NOTE — Progress Notes (Signed)
Dual-chamber pacer for complete heart block status post CABG 3 for three-vessel coronary disease mitral regurgitation documented by cath of moderate degree status post total right hip replacement 9 weeks prior to admission status post massive GI bleed secondary to anticoagulation for chronic atrial fibrillation previously patient was on full dose aspirin alone for anticoagulation [name] status post surgery she began to notice some left leg swelling bilateral lower edema and some dyspnea found to have massive right pulmonary artery emboli placed on IV heparin at present. She will require long-term oral anticoagulation will ask cardiology to comment on the choice in dose of oral anticoagulation for possible reversibility should GI bleed ensue ? 2-D echo ordered as his Doppler venous ultrasounds [name] [name] [name]:[id number] DOB: 1932-08-21 [name]: 08/08/2017 PCP: [name], Alison Silverio, MD   Physical Exam: Blood pressure 126/74, pulse 83, temperature 99.2 F (37.3 C), temperature source Oral, resp. rate 19, height 5\' 8"  (1.727 m), weight 70.3 kg (154 lb 15.7 oz), SpO2 93 %. Lungs show prolonged history phase diminished breath sounds in bases no rales wheeze rhonchi appreciable heart regular rhythm no S3 auscultated no heaves thrills rubs bilateral lower extremities reveal no evidence of rubor tenderness Homans sign negative   Investigations:  Recent Results (from the past 240 hour(s))  Blood Culture (routine x 2)     Status: None (Preliminary result)   Collection Time: 08/08/17  5:51 PM  Result Value Ref Range Status   Specimen Description RIGHT ANTECUBITAL  Final   Special Requests   Final    BOTTLES DRAWN AEROBIC AND ANAEROBIC Blood Culture adequate volume   Culture NO GROWTH < 12 HOURS  Final   Report Status PENDING  Incomplete  Blood Culture (routine x 2)     Status: None (Preliminary result)   Collection Time: 08/08/17  6:01 PM  Result Value Ref Range Status   Specimen Description BLOOD RIGHT ARM   Final   Special Requests   Final    BOTTLES DRAWN AEROBIC AND ANAEROBIC Blood Culture adequate volume   Culture NO GROWTH < 12 HOURS  Final   Report Status PENDING  Incomplete  MRSA PCR Screening     Status: None   Collection Time: 08/08/17 11:29 PM  Result Value Ref Range Status   MRSA by PCR NEGATIVE NEGATIVE Final    Comment:        The GeneXpert MRSA Assay (FDA approved for NASAL specimens only), is one component of a comprehensive MRSA colonization surveillance program. It is not intended to diagnose MRSA infection nor to guide or monitor treatment for MRSA infections.      Basic Metabolic Panel:  Recent Labs  14/78/2908/13/18 1750 08/09/17 0426  NA 138 136  K 3.5 2.9*  CL 102 103  CO2 26 21*  GLUCOSE 146* 119*  BUN 13 11  CREATININE 0.71 0.59  CALCIUM 8.9 8.4*   Liver Function Tests:  Recent Labs  08/08/17 1750  AST 16  ALT 6*  ALKPHOS 70  BILITOT 0.7  PROT 7.0  ALBUMIN 3.4*     CBC:  Recent Labs  08/08/17 1750 08/09/17 0426  WBC 7.8 7.9  NEUTROABS 5.8  --   HGB 10.6* 10.0*  HCT 35.8* 32.8*  MCV 80.4 79.6  PLT 133* 131*    Ct Angio Chest Pe W/cm &/or Wo Cm  Result Date: 08/08/2017 CLINICAL DATA:  Three day history of worsening shortness of breath. EXAM: CT ANGIOGRAPHY CHEST WITH CONTRAST TECHNIQUE: Multidetector CT imaging of the chest was performed using  the standard protocol during bolus administration of intravenous contrast. Multiplanar CT image reconstructions and MIPs were obtained to evaluate the vascular anatomy. CONTRAST:  100 cc Isovue 370 COMPARISON:  None. FINDINGS: Cardiovascular: Heart is enlarged. Patient is status post CABG Atherosclerotic calcification is noted in the wall of the thoracic aorta. Acute pulmonary embolus is identified in the right main pulmonary artery and lobar and more distal arteries to the right upper and lower lobes. Small clot burden is identified and pulmonary arteries to the left upper and lower lobes in may be  nonacute. Mediastinum/Nodes: 14 mm short axis subcarinal lymph node is associated with a 12 mm short axis lymph node in the right hilum and an 8 mm short axis left hilar lymph node. No axillary lymphadenopathy. Esophagus is unremarkable. Lungs/Pleura: Trace interlobular septal thickening is identified in the upper lobes. 4 mm subpleural nodule identified left upper lobe on image 26 series 6. No focal airspace consolidation. No overt pulmonary edema or pleural effusion. Compressive atelectasis noted posterior lower lobes bilaterally. Upper Abdomen: Unremarkable. Musculoskeletal: T9 compression fracture identified. Bones are diffusely osteopenic. Review of the MIP images confirms the above findings. IMPRESSION: 1. Acute pulmonary embolus identified in the right main pulmonary artery as well as lobar and more distal pulmonary arteries to the right upper and lower lobes. Although nonocclusive, the burden of thrombus in the right pulmonary artery is is relatively large. There is associated CT evidence of right heart strain (RV/LV Ratio = 1.1) consistent with at least submassive (intermediate risk) PE. The presence of right heart strain has been associated with an increased risk of morbidity and mortality. Please activate Code PE by paging [phone]. 2. There is trace string like embolus identified in multiple segmental and subsegmental branches to the left lung which may be acute, but given the string like appearance, chronic pulmonary embolus in the left lung would also be a consideration. 3. Mild mediastinal and right hilar lymphadenopathy, indeterminate. Follow-up CT chest with contrast in 3 months recommended to reassess. 4. Osteopenia with T9 compression fracture. Critical Value/emergent results were called by telephone at the time of interpretation on 08/08/2017 at 8:12 pm to Dr. Vanetta [name] , who verbally acknowledged these results. Electronically Signed   By: Kennith Center M.D.   On: 08/08/2017 20:12   Dg  Chest Portable 1 View  Result Date: 08/08/2017 CLINICAL DATA:  Shortness of breath. EXAM: PORTABLE CHEST 1 VIEW COMPARISON:  06/01/2017 FINDINGS: Dual lead pacer. Reverse apical lordotic positioning. Numerous leads and wires project over the chest. Midline trachea. Cardiomegaly accentuated by AP portable technique. Possible trace left pleural fluid. No pneumothorax. Low lung volumes. Pulmonary interstitial prominence. Mild left base airspace disease. IMPRESSION: Cardiomegaly and low lung volumes. Suspect mild pulmonary venous congestion. Possible trace left pleural fluid with adjacent atelectasis. Electronically Signed   By: Jeronimo Greaves M.D.   On: 08/08/2017 17:59   Dg Hand Complete Left  Result Date: 08/08/2017 CLINICAL DATA:  Left hand pain. EXAM: LEFT HAND - COMPLETE 3+ VIEW COMPARISON:  None. FINDINGS: There is no evidence of fracture or dislocation. Mild diffuse osteoarthritis is seen involving the digits the wrist. No erosions or bony lesions identified. Soft tissues are unremarkable. IMPRESSION: Mild osteoarthritis of the hand wrist. Electronically Signed   By: Irish Lack M.D.   On: 08/08/2017 18:09      Medications:   Impression:  Principal Problem:   Pulmonary emboli [facility]) Active Problems:   Essential hypertension   Coronary artery disease   S/Alison MVR (  mitral valve repair)   Ejection fraction < 50%   S/Alison CABG x 4   Complete heart block (HCC)   Pacemaker   Acute respiratory failure with hypoxia (HCC)     Plan: 2-D echo and venous ultrasounds ordered. Cardiology consult obtained for reference ventricular strain pattern and to comment upon choice of oral anticoagulation dosage considering previous GI bleed currently on IV hypainr therapeutic  Consultants: Cardiology requested   Procedures CT angios chest   Antibiotics:            Time spent: 45 minutes   LOS: 1 day   [place] Base M   08/09/2017, 2:43 PM

## 2017-08-10 DIAGNOSIS — I2699 Other pulmonary embolism without acute cor pulmonale: Secondary | ICD-10-CM

## 2017-08-10 LAB — CBC WITH DIFFERENTIAL/PLATELET
Basophils Absolute: 0 10*3/uL (ref 0.0–0.1)
Basophils Relative: 0 %
Eosinophils Absolute: 0 10*3/uL (ref 0.0–0.7)
Eosinophils Relative: 0 %
HCT: 34.3 % — ABNORMAL LOW (ref 36.0–46.0)
HEMOGLOBIN: 10.2 g/dL — AB (ref 12.0–15.0)
LYMPHS ABS: 0.7 10*3/uL (ref 0.7–4.0)
LYMPHS PCT: 6 %
MCH: 23.9 pg — AB (ref 26.0–34.0)
MCHC: 29.7 g/dL — ABNORMAL LOW (ref 30.0–36.0)
MCV: 80.5 fL (ref 78.0–100.0)
Monocytes Absolute: 0.9 10*3/uL (ref 0.1–1.0)
Monocytes Relative: 8 %
NEUTROS PCT: 86 %
Neutro Abs: 10.4 10*3/uL — ABNORMAL HIGH (ref 1.7–7.7)
PLATELETS: 177 10*3/uL (ref 150–400)
RBC: 4.26 MIL/uL (ref 3.87–5.11)
RDW: 17 % — ABNORMAL HIGH (ref 11.5–15.5)
WBC: 12 10*3/uL — AB (ref 4.0–10.5)

## 2017-08-10 LAB — BASIC METABOLIC PANEL
ANION GAP: 12 (ref 5–15)
BUN: 11 mg/dL (ref 6–20)
CALCIUM: 8.6 mg/dL — AB (ref 8.9–10.3)
CO2: 21 mmol/L — ABNORMAL LOW (ref 22–32)
Chloride: 104 mmol/L (ref 101–111)
Creatinine, Ser: 0.57 mg/dL (ref 0.44–1.00)
GFR calc non Af Amer: >60 mL/min (ref >60)
Glucose, Bld: 118 mg/dL — ABNORMAL HIGH (ref 65–99)
Potassium: 3.2 mmol/L — ABNORMAL LOW (ref 3.5–5.1)
SODIUM: 137 mmol/L (ref 135–145)

## 2017-08-10 LAB — GLUCOSE, CAPILLARY
GLUCOSE-CAPILLARY: 115 mg/dL — AB (ref 65–99)
GLUCOSE-CAPILLARY: 113 mg/dL — AB (ref 65–99)
Glucose-Capillary: 108 mg/dL — ABNORMAL HIGH (ref 65–99)
Glucose-Capillary: 96 mg/dL (ref 65–99)

## 2017-08-10 NOTE — Care Management (Signed)
Benefits check for Xarelto as follows: "Pt copay will be $145.85. Pt is currently in the donut hole. Once pt reaches the catastrophic phase copay will be $42.77- prior auth not required "

## 2017-08-10 NOTE — Plan of Care (Signed)
Problem: Skin Integrity: Goal: Risk for impaired skin integrity will decrease Outcome: Progressing Sacral foam intact, foams on heels. Heels elevated off bed. Will get out of bed to chair today.

## 2017-08-10 NOTE — Progress Notes (Signed)
Appreciate cardiology notes echocardiogram and venous Dopplers revealing bilateral lower extremity DVTs of femoral and popliteal systems. 2-D echo shows good LV systolic function some mildly dilated right ventricular pressure overload and stranding of pacer wire presumably thrombus in transit. Patient started on Xarelto 15 mg by mouth twice a day with fistulas for GI bleed have discussed with patient need for IVC filter should further emboli occur. We'll also check serum magnesium level as well as electrolytes in a.m. as well as CBC DORISANN SCHWANKE UXL:[id number] DOB: [birth year] DOA: 08/08/2017 PCP: [name], MD   Physical Exam: Blood pressure 133/75, pulse (!) 103, temperature 99 F (37.2 C), temperature source Oral, resp. rate (!) 23, height 5\' 8"  (1.727 m), weight 70.1 kg (154 lb 8.7 oz), SpO2 91 %. Lungs diminished breath sounds to bases no rales wheeze rhonchi appreciable heart regular rhythm no S3 no heaves thrills rubs   Investigations:  Recent Results (from the past 240 hour(s))  Blood Culture (routine x 2)     Status: None (Preliminary result)   Collection Time: 08/08/17  5:51 PM  Result Value Ref Range Status   Specimen Description RIGHT ANTECUBITAL  Final   Special Requests   Final    BOTTLES DRAWN AEROBIC AND ANAEROBIC Blood Culture adequate volume   Culture NO GROWTH 2 DAYS  Final   Report Status PENDING  Incomplete  Blood Culture (routine x 2)     Status: None (Preliminary result)   Collection Time: 08/08/17  6:01 PM  Result Value Ref Range Status   Specimen Description BLOOD RIGHT ARM  Final   Special Requests   Final    BOTTLES DRAWN AEROBIC AND ANAEROBIC Blood Culture adequate volume   Culture NO GROWTH 2 DAYS  Final   Report Status PENDING  Incomplete  MRSA PCR Screening     Status: None   Collection Time: 08/08/17 11:29 PM  Result Value Ref Range Status   MRSA by PCR NEGATIVE NEGATIVE Final    Comment:        The GeneXpert MRSA Assay (FDA approved for NASAL  specimens only), is one component of a comprehensive MRSA colonization surveillance program. It is not intended to diagnose MRSA infection nor to guide or monitor treatment for MRSA infections.      Basic Metabolic Panel:  Recent Labs  53/66/44 0426 08/10/17 0451  NA 136 137  K 2.9* 3.2*  CL 103 104  CO2 21* 21*  GLUCOSE 119* 118*  BUN 11 11  CREATININE 0.59 0.57  CALCIUM 8.4* 8.6*   Liver Function Tests:  Recent Labs  08/08/17 1750  AST 16  ALT 6*  ALKPHOS 70  BILITOT 0.7  PROT 7.0  ALBUMIN 3.4*     CBC:  Recent Labs  08/08/17 1750 08/09/17 0426 08/10/17 0451  WBC 7.8 7.9 12.0*  NEUTROABS 5.8  --  10.4*  HGB 10.6* 10.0* 10.2*  HCT 35.8* 32.8* 34.3*  MCV 80.4 79.6 80.5  PLT 133* 131* 177    Ct Angio Chest Pe W/cm &/or Wo Cm  Result Date: 08/08/2017 CLINICAL DATA:  Three day history of worsening shortness of breath. EXAM: CT ANGIOGRAPHY CHEST WITH CONTRAST TECHNIQUE: Multidetector CT imaging of the chest was performed using the standard protocol during bolus administration of intravenous contrast. Multiplanar CT image reconstructions and MIPs were obtained to evaluate the vascular anatomy. CONTRAST:  100 cc Isovue 370 COMPARISON:  None. FINDINGS: Cardiovascular: Heart is enlarged. Patient is status post CABG Atherosclerotic calcification is noted in the  wall of the thoracic aorta. Acute pulmonary embolus is identified in the right main pulmonary artery and lobar and more distal arteries to the right upper and lower lobes. Small clot burden is identified and pulmonary arteries to the left upper and lower lobes in may be nonacute. Mediastinum/Nodes: 14 mm short axis subcarinal lymph node is associated with a 12 mm short axis lymph node in the right hilum and an 8 mm short axis left hilar lymph node. No axillary lymphadenopathy. Esophagus is unremarkable. Lungs/Pleura: Trace interlobular septal thickening is identified in the upper lobes. 4 mm subpleural nodule  identified left upper lobe on image 26 series 6. No focal airspace consolidation. No overt pulmonary edema or pleural effusion. Compressive atelectasis noted posterior lower lobes bilaterally. Upper Abdomen: Unremarkable. Musculoskeletal: T9 compression fracture identified. Bones are diffusely osteopenic. Review of the MIP images confirms the above findings. IMPRESSION: 1. Acute pulmonary embolus identified in the right main pulmonary artery as well as lobar and more distal pulmonary arteries to the right upper and lower lobes. Although nonocclusive, the burden of thrombus in the right pulmonary artery is is relatively large. There is associated CT evidence of right heart strain (RV/LV Ratio = 1.1) consistent with at least submassive (intermediate risk) PE. The presence of right heart strain has been associated with an increased risk of morbidity and mortality. Please activate Code PE by paging [phone]. 2. There is trace string like embolus identified in multiple segmental and subsegmental branches to the left lung which may be acute, but given the string like appearance, chronic pulmonary embolus in the left lung would also be a consideration. 3. Mild mediastinal and right hilar lymphadenopathy, indeterminate. Follow-up CT chest with contrast in 3 months recommended to reassess. 4. Osteopenia with T9 compression fracture. Critical Value/emergent results were called by telephone at the time of interpretation on 08/08/2017 at 8:12 pm to Dr. Vanetta Mulders , who verbally acknowledged these results. Electronically Signed   By: Kennith Center M.D.   On: 08/08/2017 20:12   US Venous Img Lower Bilateral  Result Date: 08/09/2017 CLINICAL DATA:  Bilateral lower extremity edema. History of pulmonary embolism. Evaluate for DVT. EXAM: BILATERAL LOWER EXTREMITY VENOUS DOPPLER ULTRASOUND TECHNIQUE: Gray-scale sonography with graded compression, as well as color Doppler and duplex ultrasound were performed to evaluate the  lower extremity deep venous systems from the level of the common femoral vein and including the common femoral, femoral, profunda femoral, popliteal and calf veins including the posterior tibial, peroneal and gastrocnemius veins when visible. The superficial great saphenous vein was also interrogated. Spectral Doppler was utilized to evaluate flow at rest and with distal augmentation maneuvers in the common femoral, femoral and popliteal veins. COMPARISON:  None. FINDINGS: RIGHT LOWER EXTREMITY Common Femoral Vein: No evidence of thrombus. Normal compressibility, respiratory phasicity and response to augmentation. Saphenofemoral Junction: No evidence of thrombus. Normal compressibility and flow on color Doppler imaging. Profunda Femoral Vein: No evidence of thrombus. Normal compressibility and flow on color Doppler imaging. Femoral Vein: There is hypoechoic occlusive thrombus throughout the imaged course of the right femoral vein. Popliteal Vein: There is hypoechoic occlusive slightly expansile thrombus throughout the right popliteal vein (representative images 30 - 32). Calf Veins: Suboptimally evaluated due to patient body habitus. Superficial Great Saphenous Vein: No evidence of thrombus. Normal compressibility and flow on color Doppler imaging. Other Findings: There is a minimal subcutaneous edema at the level of the right lower leg and ankle. LEFT LOWER EXTREMITY Common Femoral Vein: No evidence of thrombus.  Normal compressibility, respiratory phasicity and response to augmentation. Saphenofemoral Junction: No evidence of thrombus. Normal compressibility and flow on color Doppler imaging. Profunda Femoral Vein: No evidence of thrombus. Normal compressibility and flow on color Doppler imaging. Femoral Vein: There is hypoechoic occlusive thrombus throughout the imaged course of the left femoral vein. Popliteal Vein: No evidence of thrombus. Normal compressibility, respiratory phasicity and response to  augmentation. Calf Veins: No evidence of thrombus. Normal compressibility and flow on color Doppler imaging. Superficial Great Saphenous Vein: No evidence of thrombus. Normal compressibility and flow on color Doppler imaging. Other Findings: A minimal subcutaneous edema is noted at the level the left calf. IMPRESSION: The examination is positive for occlusive bilateral lower extremity DVT involving the bilateral femoral and the right popliteal veins. Electronically Signed   By: Simonne ComeJohn  Watts M.D.   On: 08/09/2017 17:04   Dg Chest Portable 1 View  Result Date: 08/08/2017 CLINICAL DATA:  Shortness of breath. EXAM: PORTABLE CHEST 1 VIEW COMPARISON:  06/01/2017 FINDINGS: Dual lead pacer. Reverse apical lordotic positioning. Numerous leads and wires project over the chest. Midline trachea. Cardiomegaly accentuated by AP portable technique. Possible trace left pleural fluid. No pneumothorax. Low lung volumes. Pulmonary interstitial prominence. Mild left base airspace disease. IMPRESSION: Cardiomegaly and low lung volumes. Suspect mild pulmonary venous congestion. Possible trace left pleural fluid with adjacent atelectasis. Electronically Signed   By: Jeronimo GreavesKyle  Talbot M.D.   On: 08/08/2017 17:59   Dg Hand Complete Left  Result Date: 08/08/2017 CLINICAL DATA:  Left hand pain. EXAM: LEFT HAND - COMPLETE 3+ VIEW COMPARISON:  None. FINDINGS: There is no evidence of fracture or dislocation. Mild diffuse osteoarthritis is seen involving the digits the wrist. No erosions or bony lesions identified. Soft tissues are unremarkable. IMPRESSION: Mild osteoarthritis of the hand wrist. Electronically Signed   By: Irish LackGlenn  Yamagata M.D.   On: 08/08/2017 18:09      Medications:   Impression:  Principal Problem:   Pulmonary emboli (HCC) Active Problems:   Essential hypertension   Coronary artery disease   S/P MVR (mitral valve repair)   Ejection fraction < 50%   S/P CABG x 4   Complete heart block (HCC)   Pacemaker    Acute respiratory failure with hypoxia (HCC)     Plan: CBC in a.m. bmet in a.m. with magnesium level therapy for strengthening and ambulation   Consultants: Cardiology and pharmacy    Procedures   Antibiotics: Vancomycin and Zosyn and Levaquin          Time spent: 30 minutes   LOS: 2 days   Asami Lambright M   08/10/2017, 12:56 PM

## 2017-08-10 NOTE — Plan of Care (Signed)
Problem: Pain Managment: Goal: General experience of comfort will improve Outcome: Progressing Patient stated she is having pain in mid back due to fracture. RN gave PRN med. Patient experienced relief.  Problem: Tissue Perfusion: Goal: Risk factors for ineffective tissue perfusion will decrease Outcome: Progressing Patient now on PO Xarelto

## 2017-08-10 NOTE — Progress Notes (Signed)
Progress Note  Patient Name: [name] Date of Encounter: 08/10/2017  Primary Cardiologist: Purvis Sheffield  Subjective   Less dyspnea sitting up in bed eating breakfast  Inpatient Medications    Scheduled Meds: . docusate sodium  100 mg Oral BID  . FLUoxetine  20 mg Oral Daily  . insulin aspart  0-9 Units Subcutaneous TID WC  . pantoprazole  40 mg Oral Daily  . Rivaroxaban  15 mg Oral BID WC  . sodium chloride flush  3 mL Intravenous Q12H   Continuous Infusions: . sodium chloride    . aztreonam Stopped (08/10/17 0617)  . vancomycin Stopped (08/09/17 2045)   PRN Meds: sodium chloride, fentaNYL (SUBLIMAZE) injection, ondansetron **OR** ondansetron (ZOFRAN) IV, oxyCODONE, polyethylene glycol, sodium chloride flush, traMADol   Vital Signs    Vitals:   08/10/17 0400 08/10/17 0500 08/10/17 0600 08/10/17 0800  BP: 134/69 (!) 142/64 134/68   Pulse: 95 99 87   Resp: 19 (!) 25 (!) 21   Temp: 98 F (36.7 C)   99 F (37.2 C)  TempSrc: Axillary   Oral  SpO2: 92% 90% 92%   Weight:  154 lb 8.7 oz (70.1 kg)    Height:        Intake/Output Summary (Last 24 hours) at 08/10/17 0849 Last data filed at 08/10/17 0617  Gross per 24 hour  Intake              730 ml  Output             1450 ml  Net             -720 ml   Filed Weights   08/08/17 2328 08/09/17 0500 08/10/17 0500  Weight: 154 lb 15.7 oz (70.3 kg) 154 lb 15.7 oz (70.3 kg) 154 lb 8.7 oz (70.1 kg)    Telemetry    40 beat run NSVT asymptomatic surprisingly othewise V pacing  - Personally Reviewed  ECG    V pacing  - Personally Reviewed  Physical Exam   GEN: No acute distress.   Neck: No JVD Cardiac: RRR, no murmurs, rubs, or gallops.  Respiratory: basilar crackles auscultation bilaterally. GI: Soft, nontender, non-distended  MS: No edema; No deformity. Neuro:  Nonfocal  Psych: Normal affect   Labs    Chemistry Recent Labs Lab 08/08/17 1750 08/09/17 0426 08/10/17 0451  NA 138 136 137  K 3.5  2.9* 3.2*  CL 102 103 104  CO2 26 21* 21*  GLUCOSE 146* 119* 118*  BUN 13 11 11   CREATININE 0.71 0.59 0.57  CALCIUM 8.9 8.4* 8.6*  PROT 7.0  --   --   ALBUMIN 3.4*  --   --   AST 16  --   --   ALT 6*  --   --   ALKPHOS 70  --   --   BILITOT 0.7  --   --   GFRNONAA >60 >60 >60  GFRAA >60 >60 >60  ANIONGAP 10 12 12      Hematology Recent Labs Lab 08/08/17 1750 08/09/17 0426 08/10/17 0451  WBC 7.8 7.9 12.0*  RBC 4.45 4.12 4.26  HGB 10.6* 10.0* 10.2*  HCT 35.8* 32.8* 34.3*  MCV 80.4 79.6 80.5  MCH 23.8* 24.3* 23.9*  MCHC 29.6* 30.5 29.7*  RDW 17.0* 16.8* 17.0*  PLT 133* 131* 177    Cardiac Enzymes Recent Labs Lab 08/08/17 1750  TROPONINI <0.03   No results for input(s): TROPIPOC in the last 168 hours.  BNPNo results for input(s): BNP, PROBNP in the last 168 hours.   DDimer No results for input(s): DDIMER in the last 168 hours.   Radiology    Ct Angio Chest Pe W/cm &/or Wo Cm  Result Date: 08/08/2017 CLINICAL DATA:  Three day history of worsening shortness of breath. EXAM: CT ANGIOGRAPHY CHEST WITH CONTRAST TECHNIQUE: Multidetector CT imaging of the chest was performed using the standard protocol during bolus administration of intravenous contrast. Multiplanar CT image reconstructions and MIPs were obtained to evaluate the vascular anatomy. CONTRAST:  100 cc Isovue 370 COMPARISON:  None. FINDINGS: Cardiovascular: Heart is enlarged. Patient is status post CABG Atherosclerotic calcification is noted in the wall of the thoracic aorta. Acute pulmonary embolus is identified in the right main pulmonary artery and lobar and more distal arteries to the right upper and lower lobes. Small clot burden is identified and pulmonary arteries to the left upper and lower lobes in may be nonacute. Mediastinum/Nodes: 14 mm short axis subcarinal lymph node is associated with a 12 mm short axis lymph node in the right hilum and an 8 mm short axis left hilar lymph node. No axillary  lymphadenopathy. Esophagus is unremarkable. Lungs/Pleura: Trace interlobular septal thickening is identified in the upper lobes. 4 mm subpleural nodule identified left upper lobe on image 26 series 6. No focal airspace consolidation. No overt pulmonary edema or pleural effusion. Compressive atelectasis noted posterior lower lobes bilaterally. Upper Abdomen: Unremarkable. Musculoskeletal: T9 compression fracture identified. Bones are diffusely osteopenic. Review of the MIP images confirms the above findings. IMPRESSION: 1. Acute pulmonary embolus identified in the right main pulmonary artery as well as lobar and more distal pulmonary arteries to the right upper and lower lobes. Although nonocclusive, the burden of thrombus in the right pulmonary artery is is relatively large. There is associated CT evidence of right heart strain (RV/LV Ratio = 1.1) consistent with at least submassive (intermediate risk) PE. The presence of right heart strain has been associated with an increased risk of morbidity and mortality. Please activate Code PE by paging [phone]. 2. There is trace string like embolus identified in multiple segmental and subsegmental branches to the left lung which may be acute, but given the string like appearance, chronic pulmonary embolus in the left lung would also be a consideration. 3. Mild mediastinal and right hilar lymphadenopathy, indeterminate. Follow-up CT chest with contrast in 3 months recommended to reassess. 4. Osteopenia with T9 compression fracture. Critical Value/emergent results were called by telephone at the time of interpretation on 08/08/2017 at 8:12 pm to Dr. Vanetta MuldersSCOTT ZACKOWSKI , who verbally acknowledged these results. Electronically Signed   By: Kennith CenterEric  Mansell M.D.   On: 08/08/2017 20:12   [place] Venous Img Lower Bilateral  Result Date: 08/09/2017 CLINICAL DATA:  Bilateral lower extremity edema. History of pulmonary embolism. Evaluate for DVT. EXAM: BILATERAL LOWER EXTREMITY VENOUS  DOPPLER ULTRASOUND TECHNIQUE: Gray-scale sonography with graded compression, as well as color Doppler and duplex ultrasound were performed to evaluate the lower extremity deep venous systems from the level of the common femoral vein and including the common femoral, femoral, profunda femoral, popliteal and calf veins including the posterior tibial, peroneal and gastrocnemius veins when visible. The superficial great saphenous vein was also interrogated. Spectral Doppler was utilized to evaluate flow at rest and with distal augmentation maneuvers in the common femoral, femoral and popliteal veins. COMPARISON:  None. FINDINGS: RIGHT LOWER EXTREMITY Common Femoral Vein: No evidence of thrombus. Normal compressibility, respiratory phasicity and response to augmentation. Saphenofemoral  Junction: No evidence of thrombus. Normal compressibility and flow on color Doppler imaging. Profunda Femoral Vein: No evidence of thrombus. Normal compressibility and flow on color Doppler imaging. Femoral Vein: There is hypoechoic occlusive thrombus throughout the imaged course of the right femoral vein. Popliteal Vein: There is hypoechoic occlusive slightly expansile thrombus throughout the right popliteal vein (representative images 30 - 32). Calf Veins: Suboptimally evaluated due to patient body habitus. Superficial Great Saphenous Vein: No evidence of thrombus. Normal compressibility and flow on color Doppler imaging. Other Findings: There is a minimal subcutaneous edema at the level of the right lower leg and ankle. LEFT LOWER EXTREMITY Common Femoral Vein: No evidence of thrombus. Normal compressibility, respiratory phasicity and response to augmentation. Saphenofemoral Junction: No evidence of thrombus. Normal compressibility and flow on color Doppler imaging. Profunda Femoral Vein: No evidence of thrombus. Normal compressibility and flow on color Doppler imaging. Femoral Vein: There is hypoechoic occlusive thrombus throughout the  imaged course of the left femoral vein. Popliteal Vein: No evidence of thrombus. Normal compressibility, respiratory phasicity and response to augmentation. Calf Veins: No evidence of thrombus. Normal compressibility and flow on color Doppler imaging. Superficial Great Saphenous Vein: No evidence of thrombus. Normal compressibility and flow on color Doppler imaging. Other Findings: A minimal subcutaneous edema is noted at the level the left calf. IMPRESSION: The examination is positive for occlusive bilateral lower extremity DVT involving the bilateral femoral and the right popliteal veins. Electronically Signed   By: Simonne Come M.D.   On: 08/09/2017 17:04   Dg Chest Portable 1 View  Result Date: 08/08/2017 CLINICAL DATA:  Shortness of breath. EXAM: PORTABLE CHEST 1 VIEW COMPARISON:  06/01/2017 FINDINGS: Dual lead pacer. Reverse apical lordotic positioning. Numerous leads and wires project over the chest. Midline trachea. Cardiomegaly accentuated by AP portable technique. Possible trace left pleural fluid. No pneumothorax. Low lung volumes. Pulmonary interstitial prominence. Mild left base airspace disease. IMPRESSION: Cardiomegaly and low lung volumes. Suspect mild pulmonary venous congestion. Possible trace left pleural fluid with adjacent atelectasis. Electronically Signed   By: Jeronimo Greaves M.D.   On: 08/08/2017 17:59   Dg Hand Complete Left  Result Date: 08/08/2017 CLINICAL DATA:  Left hand pain. EXAM: LEFT HAND - COMPLETE 3+ VIEW COMPARISON:  None. FINDINGS: There is no evidence of fracture or dislocation. Mild diffuse osteoarthritis is seen involving the digits the wrist. No erosions or bony lesions identified. Soft tissues are unremarkable. IMPRESSION: Mild osteoarthritis of the hand wrist. Electronically Signed   By: Irish Lack M.D.   On: 08/08/2017 18:09    Cardiac Studies   Echo: personally reviewed Study Conclusions  - Left ventricle: The cavity size was normal. Systolic function  was   normal. The estimated ejection fraction was in the range of 55%   to 60%. Wall motion was normal; there were no regional wall   motion abnormalities. Mild concentric and moderate focal basal   septal hypertrophy. - Aortic valve: Mildly calcified annulus. Trileaflet. There was   mild regurgitation. - Mitral valve: S/p mitral valve repair. Functioning normally with   no signficant valvular regurgitation. Calcified annulus. - Right ventricle: Pacer wire or catheter noted in right ventricle.   There are mobile structures which appear to be attached to the   pacer wire. Given the clinical context (pulmonary embolism), this   is likely consistent with thrombus in transit. However,   vegetation cannot entirely be ruled out. Systolic function was   mildly reduced. - Right atrium: The atrium was  mildly dilated. Pacer wire or   catheter noted in right atrium. - Atrial septum: No defect or patent foramen ovale was identified. - Tricuspid valve: There was mild-moderate regurgitation. - Pulmonary arteries: PA peak pressure: 38 mm Hg (S).  Venous duplex : bilateral popliteal and femoral DVT  Patient Profile     83 y.o. female admitted with dyspnea and found to have submassive PE right main PA  Assessment & Plan    1) PE:  See consult note Dr Darl Householder. On xarelto If she has any bleeding issues will need IVC filter 2) Pacer normal function likely thrombus on leads. No need for TEE currently unless BC;s positive 3) NSVT: likely from RV strain can consider amiodarone if long salvos repeat observe for now   Signed, Charlton Haws, MD  08/10/2017, 8:49 AM

## 2017-08-11 DIAGNOSIS — I82403 Acute embolism and thrombosis of unspecified deep veins of lower extremity, bilateral: Secondary | ICD-10-CM

## 2017-08-11 DIAGNOSIS — I472 Ventricular tachycardia: Secondary | ICD-10-CM

## 2017-08-11 DIAGNOSIS — Z951 Presence of aortocoronary bypass graft: Secondary | ICD-10-CM

## 2017-08-11 LAB — BASIC METABOLIC PANEL
ANION GAP: 8 (ref 5–15)
BUN: 15 mg/dL (ref 6–20)
CHLORIDE: 105 mmol/L (ref 101–111)
CO2: 25 mmol/L (ref 22–32)
Calcium: 8.7 mg/dL — ABNORMAL LOW (ref 8.9–10.3)
Creatinine, Ser: 0.52 mg/dL (ref 0.44–1.00)
GFR calc Af Amer: >60 mL/min (ref >60)
GLUCOSE: 97 mg/dL (ref 65–99)
POTASSIUM: 3.3 mmol/L — AB (ref 3.5–5.1)
Sodium: 138 mmol/L (ref 135–145)

## 2017-08-11 LAB — MAGNESIUM: Magnesium: 1.7 mg/dL (ref 1.7–2.4)

## 2017-08-11 LAB — URIC ACID: Uric Acid, Serum: 2.9 mg/dL (ref 2.3–6.6)

## 2017-08-11 LAB — GLUCOSE, CAPILLARY
GLUCOSE-CAPILLARY: 154 mg/dL — AB (ref 65–99)
GLUCOSE-CAPILLARY: 146 mg/dL — AB (ref 65–99)
Glucose-Capillary: 90 mg/dL (ref 65–99)
Glucose-Capillary: 159 mg/dL — ABNORMAL HIGH (ref 65–99)

## 2017-08-11 LAB — HEMOGLOBIN AND HEMATOCRIT, BLOOD
HEMATOCRIT: 32.6 % — AB (ref 36.0–46.0)
Hemoglobin: 9.6 g/dL — ABNORMAL LOW (ref 12.0–15.0)

## 2017-08-11 MED ORDER — POTASSIUM CHLORIDE CRYS ER 20 MEQ PO TBCR
20.0000 meq | EXTENDED_RELEASE_TABLET | Freq: Every day | ORAL | Status: AC
  Administered 2017-08-11 – 2017-08-12 (×2): 20 meq via ORAL
  Filled 2017-08-11 (×2): qty 1

## 2017-08-11 NOTE — Progress Notes (Signed)
Progress Note  Patient Name: [name] Date of Encounter: 08/11/2017  Primary Cardiologist: Wyline Mood  Subjective   Denies chest pain. Breathing has improved. 2 daughters in room.  Inpatient Medications    Scheduled Meds: . docusate sodium  100 mg Oral BID  . FLUoxetine  20 mg Oral Daily  . insulin aspart  0-9 Units Subcutaneous TID WC  . pantoprazole  40 mg Oral Daily  . Rivaroxaban  15 mg Oral BID WC  . sodium chloride flush  3 mL Intravenous Q12H   Continuous Infusions: . sodium chloride    . aztreonam Stopped (08/11/17 0530)  . vancomycin Stopped (08/11/17 0923)   PRN Meds: sodium chloride, fentaNYL (SUBLIMAZE) injection, ondansetron **OR** ondansetron (ZOFRAN) IV, oxyCODONE, polyethylene glycol, sodium chloride flush, traMADol   Vital Signs    Vitals:   08/11/17 0500 08/11/17 0600 08/11/17 0700 08/11/17 0800  BP: (!) 141/80 129/82 (!) 144/73 126/74  Pulse: 100 (!) 101 (!) 106 92  Resp: (!) 22 19 19  (!) 23  Temp:    98.1 F (36.7 C)  TempSrc:    Oral  SpO2: 92% 92% 92% 93%  Weight:      Height:        Intake/Output Summary (Last 24 hours) at 08/11/17 1101 Last data filed at 08/11/17 0600  Gross per 24 hour  Intake              870 ml  Output             1150 ml  Net             -280 ml   Filed Weights   08/08/17 2328 08/09/17 0500 08/10/17 0500  Weight: 154 lb 15.7 oz (70.3 kg) 154 lb 15.7 oz (70.3 kg) 154 lb 8.7 oz (70.1 kg)    Telemetry    Ventricular paced rhythm, PVC's - Personally Reviewed  ECG      Physical Exam   GEN: No acute distress. Elderly, frail. Neck: No JVD Cardiac: RRR, no murmurs, rubs, or gallops.  Respiratory: Diminished. GI: Soft, nontender, non-distended  MS: Bilateral leg edema. Neuro:  Nonfocal  Psych: Normal affect   Labs    Chemistry Recent Labs Lab 08/08/17 1750 08/09/17 0426 08/10/17 0451 08/11/17 0537  NA 138 136 137 138  K 3.5 2.9* 3.2* 3.3*  CL 102 103 104 105  CO2 26 21* 21* 25  GLUCOSE  146* 119* 118* 97  BUN 13 11 11 15   CREATININE 0.71 0.59 0.57 0.52  CALCIUM 8.9 8.4* 8.6* 8.7*  PROT 7.0  --   --   --   ALBUMIN 3.4*  --   --   --   AST 16  --   --   --   ALT 6*  --   --   --   ALKPHOS 70  --   --   --   BILITOT 0.7  --   --   --   GFRNONAA >60 >60 >60 >60  GFRAA >60 >60 >60 >60  ANIONGAP 10 12 12 8      Hematology Recent Labs Lab 08/08/17 1750 08/09/17 0426 08/10/17 0451 08/11/17 0537  WBC 7.8 7.9 12.0*  --   RBC 4.45 4.12 4.26  --   HGB 10.6* 10.0* 10.2* 9.6*  HCT 35.8* 32.8* 34.3* 32.6*  MCV 80.4 79.6 80.5  --   MCH 23.8* 24.3* 23.9*  --   MCHC 29.6* 30.5 29.7*  --   RDW 17.0*  16.8* 17.0*  --   PLT 133* 131* 177  --     Cardiac Enzymes Recent Labs Lab 08/08/17 1750  TROPONINI <0.03   No results for input(s): TROPIPOC in the last 168 hours.   BNPNo results for input(s): BNP, PROBNP in the last 168 hours.   DDimer No results for input(s): DDIMER in the last 168 hours.   Radiology    US Venous Img Lower Bilateral  Result Date: 08/09/2017 CLINICAL DATA:  Bilateral lower extremity edema. History of pulmonary embolism. Evaluate for DVT. EXAM: BILATERAL LOWER EXTREMITY VENOUS DOPPLER ULTRASOUND TECHNIQUE: Gray-scale sonography with graded compression, as well as color Doppler and duplex ultrasound were performed to evaluate the lower extremity deep venous systems from the level of the common femoral vein and including the common femoral, femoral, profunda femoral, popliteal and calf veins including the posterior tibial, peroneal and gastrocnemius veins when visible. The superficial great saphenous vein was also interrogated. Spectral Doppler was utilized to evaluate flow at rest and with distal augmentation maneuvers in the common femoral, femoral and popliteal veins. COMPARISON:  None. FINDINGS: RIGHT LOWER EXTREMITY Common Femoral Vein: No evidence of thrombus. Normal compressibility, respiratory phasicity and response to augmentation. Saphenofemoral  Junction: No evidence of thrombus. Normal compressibility and flow on color Doppler imaging. Profunda Femoral Vein: No evidence of thrombus. Normal compressibility and flow on color Doppler imaging. Femoral Vein: There is hypoechoic occlusive thrombus throughout the imaged course of the right femoral vein. Popliteal Vein: There is hypoechoic occlusive slightly expansile thrombus throughout the right popliteal vein (representative images 30 - 32). Calf Veins: Suboptimally evaluated due to patient body habitus. Superficial Great Saphenous Vein: No evidence of thrombus. Normal compressibility and flow on color Doppler imaging. Other Findings: There is a minimal subcutaneous edema at the level of the right lower leg and ankle. LEFT LOWER EXTREMITY Common Femoral Vein: No evidence of thrombus. Normal compressibility, respiratory phasicity and response to augmentation. Saphenofemoral Junction: No evidence of thrombus. Normal compressibility and flow on color Doppler imaging. Profunda Femoral Vein: No evidence of thrombus. Normal compressibility and flow on color Doppler imaging. Femoral Vein: There is hypoechoic occlusive thrombus throughout the imaged course of the left femoral vein. Popliteal Vein: No evidence of thrombus. Normal compressibility, respiratory phasicity and response to augmentation. Calf Veins: No evidence of thrombus. Normal compressibility and flow on color Doppler imaging. Superficial Great Saphenous Vein: No evidence of thrombus. Normal compressibility and flow on color Doppler imaging. Other Findings: A minimal subcutaneous edema is noted at the level the left calf. IMPRESSION: The examination is positive for occlusive bilateral lower extremity DVT involving the bilateral femoral and the right popliteal veins. Electronically Signed   By: Simonne Come M.D.   On: 08/09/2017 17:04    Cardiac Studies   Echocardiogram (08/09/17):  Study Conclusions  - Left ventricle: The cavity size was normal.  Systolic function was   normal. The estimated ejection fraction was in the range of 55%   to 60%. Wall motion was normal; there were no regional wall   motion abnormalities. Mild concentric and moderate focal basal   septal hypertrophy. - Aortic valve: Mildly calcified annulus. Trileaflet. There was   mild regurgitation. - Mitral valve: S/p mitral valve repair. Functioning normally with   no signficant valvular regurgitation. Calcified annulus. - Right ventricle: Pacer wire or catheter noted in right ventricle.   There are mobile structures which appear to be attached to the   pacer wire. Given the clinical context (pulmonary embolism), this  is likely consistent with thrombus in transit. However,   vegetation cannot entirely be ruled out. Systolic function was   mildly reduced. - Right atrium: The atrium was mildly dilated. Pacer wire or   catheter noted in right atrium. - Atrial septum: No defect or patent foramen ovale was identified. - Tricuspid valve: There was mild-moderate regurgitation. - Pulmonary arteries: PA peak pressure: 38 mm Hg (S).  Patient Profile     83 y.o. female with CAD and CABG, pacemaker, and h/o MV repair admitted with submassive right-sided pulmonary embolism and bilateral DVT involving the bilateral femoral and the right popliteal veins.  Assessment & Plan    1. Acute submassive pulmonary embolism of right main PA and distal vessels with more chronic appearing pulmonary emboli in left lung with bilateral DVT: She has a h/o anemia of uncertain etiology in the past. Had been on warfarin years ago for post-operative atrial fibrillation. No recent EGD or colonoscopy.  I initiated Xarelto given that [facility]. NOAC's are preferred over vitamin K antagonists for the treatment of PE.  This appears to be provoked (prolonged immobility after surgery and then due to T9 compression fracture); hence, they typical length of  therapy would be 3 months. However, given the chronic appearing left pulmonary emboli and overall thrombus burden, I would recommend a longer duration of therapy.  If bleeding problems develop, she would need a retractable IVC filter.   She will need outpatient EGD and colonoscopy to evaluate for a GI source of anemia, this being the most common etiology. Ferritin was normal.  Echocardiogram shows mobile masses attached to pacer wire and given clinical context, this likely represents thrombus in transit. Blood cultures show no growth so endocarditis unlikely.   2. Left wrist pain: This appears to be osteoarthritic but I she may have superimposed gout. I will check a serum urate level (I previously ordered this on 08/09/17 but it wasn't drawn). I have reordered.  3. CAD with CABG: Symptomatically stable. Coreg currently on hold. Would not give ASA given need for anticoagulation and h/o anemia/bleed. Not on statins due to myalgias.  4. Anemia: She will need outpatient EGD and colonoscopy to evaluate for a GI source of anemia, this being the most common etiology. Ferritin level was normal. Likely iron deficiency given elevated RDW and low normal MCV. Hgb 9.6 today (10.6 three days ago). Continue to monitor for drop.  5. Pacemaker for CHB: Stable with normal pacemaker function. Echocardiogram shows mobile masses attached to pacer wire and given clinical context, this likely represents thrombus in transit. Blood cultures show no growth so endocarditis less likely.   6. Chronic systolic heart failure: Echocardiogram shows normalization of LV systolic function, EF 55-60%. No indication for diuretics at this time. Coreg on hold. No ACEI due to h/o angioedema.  7. Hypertension: BP normal. Coreg on hold.  7. S/p mitral valve repair: Echo shows durable valve repair with only trivial regurgitation.  8: NSVT: No recurrences.    Signed, Prentice DockerSuresh Cambry Spampinato, MD  08/11/2017, 11:01 AM

## 2017-08-11 NOTE — Progress Notes (Signed)
Patient tolerating Xarelto hemoglobin 9.6. Hemoglobin and hematocrit every 12 ordered potassium is 3.3 we'll give K Dur 20 mg by mouth daily for 2 days off of bed rest physical therapy to strengthening ambulation and out of bed to chair 3 times a day [name] OXB:[id number] DOB: 1932-09-27 DOA: 08/08/2017 PCP: Lucia Gaskins, MD   Physical Exam: Blood pressure 126/74, pulse 92, temperature 98.6 F (37 C), temperature source Oral, resp. rate (!) 23, height '5\' 8"'$  (1.727 m), weight 70.1 kg (154 lb 8.7 oz), SpO2 93 %. Lungs diminished breath sounds in the bases no rales wheeze or rhonchi appreciable heart regular rhythm no S3 auscultated no heaves thrills or rubs   Investigations:  Recent Results (from the past 240 hour(s))  Blood Culture (routine x 2)     Status: None (Preliminary result)   Collection Time: 08/08/17  5:51 PM  Result Value Ref Range Status   Specimen Description RIGHT ANTECUBITAL  Final   Special Requests   Final    BOTTLES DRAWN AEROBIC AND ANAEROBIC Blood Culture adequate volume   Culture NO GROWTH 3 DAYS  Final   Report Status PENDING  Incomplete  Blood Culture (routine x 2)     Status: None (Preliminary result)   Collection Time: 08/08/17  6:01 PM  Result Value Ref Range Status   Specimen Description BLOOD RIGHT ARM  Final   Special Requests   Final    BOTTLES DRAWN AEROBIC AND ANAEROBIC Blood Culture adequate volume   Culture NO GROWTH 3 DAYS  Final   Report Status PENDING  Incomplete  MRSA PCR Screening     Status: None   Collection Time: 08/08/17 11:29 PM  Result Value Ref Range Status   MRSA by PCR NEGATIVE NEGATIVE Final    Comment:        The GeneXpert MRSA Assay (FDA approved for NASAL specimens only), is one component of a comprehensive MRSA colonization surveillance program. It is not intended to diagnose MRSA infection nor to guide or monitor treatment for MRSA infections.      Basic Metabolic Panel:  Recent Labs  08/10/17 0451  08/11/17 0537  NA 137 138  K 3.2* 3.3*  CL 104 105  CO2 21* 25  GLUCOSE 118* 97  BUN 11 15  CREATININE 0.57 0.52  CALCIUM 8.6* 8.7*  MG  --  1.7   Liver Function Tests:  Recent Labs  08/08/17 1750  AST 16  ALT 6*  ALKPHOS 70  BILITOT 0.7  PROT 7.0  ALBUMIN 3.4*     CBC:  Recent Labs  08/08/17 1750 08/09/17 0426 08/10/17 0451 08/11/17 0537  WBC 7.8 7.9 12.0*  --   NEUTROABS 5.8  --  10.4*  --   HGB 10.6* 10.0* 10.2* 9.6*  HCT 35.8* 32.8* 34.3* 32.6*  MCV 80.4 79.6 80.5  --   PLT 133* 131* 177  --     US Venous Img Lower Bilateral  Result Date: 08/09/2017 CLINICAL DATA:  Bilateral lower extremity edema. History of pulmonary embolism. Evaluate for DVT. EXAM: BILATERAL LOWER EXTREMITY VENOUS DOPPLER ULTRASOUND TECHNIQUE: Gray-scale sonography with graded compression, as well as color Doppler and duplex ultrasound were performed to evaluate the lower extremity deep venous systems from the level of the common femoral vein and including the common femoral, femoral, profunda femoral, popliteal and calf veins including the posterior tibial, peroneal and gastrocnemius veins when visible. The superficial great saphenous vein was also interrogated. Spectral Doppler was utilized to evaluate flow at  rest and with distal augmentation maneuvers in the common femoral, femoral and popliteal veins. COMPARISON:  None. FINDINGS: RIGHT LOWER EXTREMITY Common Femoral Vein: No evidence of thrombus. Normal compressibility, respiratory phasicity and response to augmentation. Saphenofemoral Junction: No evidence of thrombus. Normal compressibility and flow on color Doppler imaging. Profunda Femoral Vein: No evidence of thrombus. Normal compressibility and flow on color Doppler imaging. Femoral Vein: There is hypoechoic occlusive thrombus throughout the imaged course of the right femoral vein. Popliteal Vein: There is hypoechoic occlusive slightly expansile thrombus throughout the right popliteal  vein (representative images 30 - 32). Calf Veins: Suboptimally evaluated due to patient body habitus. Superficial Great Saphenous Vein: No evidence of thrombus. Normal compressibility and flow on color Doppler imaging. Other Findings: There is a minimal subcutaneous edema at the level of the right lower leg and ankle. LEFT LOWER EXTREMITY Common Femoral Vein: No evidence of thrombus. Normal compressibility, respiratory phasicity and response to augmentation. Saphenofemoral Junction: No evidence of thrombus. Normal compressibility and flow on color Doppler imaging. Profunda Femoral Vein: No evidence of thrombus. Normal compressibility and flow on color Doppler imaging. Femoral Vein: There is hypoechoic occlusive thrombus throughout the imaged course of the left femoral vein. Popliteal Vein: No evidence of thrombus. Normal compressibility, respiratory phasicity and response to augmentation. Calf Veins: No evidence of thrombus. Normal compressibility and flow on color Doppler imaging. Superficial Great Saphenous Vein: No evidence of thrombus. Normal compressibility and flow on color Doppler imaging. Other Findings: A minimal subcutaneous edema is noted at the level the left calf. IMPRESSION: The examination is positive for occlusive bilateral lower extremity DVT involving the bilateral femoral and the right popliteal veins. Electronically Signed   By: Sandi Mariscal M.D.   On: 08/09/2017 17:04      Medications:   Impression:  Principal Problem:   Pulmonary emboli (HCC) Active Problems:   Essential hypertension   Coronary artery disease   S/P MVR (mitral valve repair)   Ejection fraction < 50%   S/P CABG x 4   Complete heart block (HCC)   Pacemaker   Acute respiratory failure with hypoxia (HCC)     Plan: oob 3 times a day. Physical therapy for strengthening and ambulation. K Dur 20 mg by mouth today and daily be met in a.m. hemoglobin and hematocrit every 12  hours  Consultants: Cardiology   Procedures   Antibiotics: Vancomycin and Zosyn and Levaquin      Time spent: 30 minutes   LOS: 3 days   Akhilesh Sassone M   08/11/2017, 12:51 PM

## 2017-08-11 NOTE — Care Management (Signed)
Case management continues to follow for needs. Xarelto voucher will be given. PT eval pending.

## 2017-08-11 NOTE — Progress Notes (Signed)
PT Cancellation Note  Patient Details Name: [name] MRN: [id number] DOB: 29-Jan-1932   Cancelled Treatment:    Reason Eval/Treat Not Completed: Pain limiting ability to participate. RN in room assisting patient with bath and reports that the patient is experiencing significant pain with minimal movement and recommends to  Return and attempt  OOB tomorrow.    Rada HayHill, Nygel Prokop [name] 08/11/2017, 3:30 PM Blanchard KelchKaren Mykal Batiz PT [phone]

## 2017-08-12 LAB — BASIC METABOLIC PANEL
ANION GAP: 8 (ref 5–15)
BUN: 16 mg/dL (ref 6–20)
CALCIUM: 8.5 mg/dL — AB (ref 8.9–10.3)
CO2: 25 mmol/L (ref 22–32)
Chloride: 104 mmol/L (ref 101–111)
Creatinine, Ser: 0.43 mg/dL — ABNORMAL LOW (ref 0.44–1.00)
GFR calc Af Amer: >60 mL/min (ref >60)
GFR calc non Af Amer: >60 mL/min (ref >60)
GLUCOSE: 109 mg/dL — AB (ref 65–99)
POTASSIUM: 3.6 mmol/L (ref 3.5–5.1)
SODIUM: 137 mmol/L (ref 135–145)

## 2017-08-12 LAB — HEMOGLOBIN AND HEMATOCRIT, BLOOD
HEMATOCRIT: 33.2 % — AB (ref 36.0–46.0)
Hemoglobin: 9.8 g/dL — ABNORMAL LOW (ref 12.0–15.0)

## 2017-08-12 LAB — GLUCOSE, CAPILLARY
GLUCOSE-CAPILLARY: 133 mg/dL — AB (ref 65–99)
Glucose-Capillary: 113 mg/dL — ABNORMAL HIGH (ref 65–99)
Glucose-Capillary: 156 mg/dL — ABNORMAL HIGH (ref 65–99)
Glucose-Capillary: 129 mg/dL — ABNORMAL HIGH (ref 65–99)

## 2017-08-12 MED ORDER — METHOCARBAMOL 500 MG PO TABS
750.0000 mg | ORAL_TABLET | Freq: Two times a day (BID) | ORAL | Status: DC | PRN
  Administered 2017-08-12 – 2017-08-15 (×5): 750 mg via ORAL
  Filled 2017-08-12 (×6): qty 2

## 2017-08-12 NOTE — Progress Notes (Signed)
Patient has submassive right pulmonary artery emboli. Status post upper GI bleed requiring transfusions 6 months ago. Currently on Xarelto day 3:15 milligrams by mouth twice a day. Hemoglobin stable at 9.8 complains of left wrist pain.  We cannot give and states due to history of GI bleed and risk benefit ratio which is adverse to patient currently on tramadol. Physical therapy and out of bed to chair 3 times a day for strengthening and ambulation necessary. [name] [id number] DOB: 1932-02-22 DOA: 08/08/2017 PCP: Alison Linsey, MD   Physical Exam: Blood pressure 135/66, pulse 91, temperature 98.3 F (36.8 C), temperature source Oral, resp. rate (!) 23, height 5\' 8"  (1.727 m), weight 70.1 kg (154 lb 8.7 oz), SpO2 94 %. Lungs diminished breath sounds in the bases prolonged x-ray phase no rales no wheezes audible heart regular rhythm no S3-S4 no heaves thrills rubs   Investigations:  Recent Results (from the past 240 hour(s))  Blood Culture (routine x 2)     Status: None (Preliminary result)   Collection Time: 08/08/17  5:51 PM  Result Value Ref Range Status   Specimen Description RIGHT ANTECUBITAL  Final   Special Requests   Final    BOTTLES DRAWN AEROBIC AND ANAEROBIC Blood Culture adequate volume   Culture NO GROWTH 4 DAYS  Final   Report Status PENDING  Incomplete  Blood Culture (routine x 2)     Status: None (Preliminary result)   Collection Time: 08/08/17  6:01 PM  Result Value Ref Range Status   Specimen Description BLOOD RIGHT ARM  Final   Special Requests   Final    BOTTLES DRAWN AEROBIC AND ANAEROBIC Blood Culture adequate volume   Culture NO GROWTH 4 DAYS  Final   Report Status PENDING  Incomplete  MRSA PCR Screening     Status: None   Collection Time: 08/08/17 11:29 PM  Result Value Ref Range Status   MRSA by PCR NEGATIVE NEGATIVE Final    Comment:        The GeneXpert MRSA Assay (FDA approved for NASAL specimens only), is one component of a comprehensive  MRSA colonization surveillance program. It is not intended to diagnose MRSA infection nor to guide or monitor treatment for MRSA infections.      Basic Metabolic Panel:  Recent Labs  86/75/44 0537 08/12/17 0523  NA 138 137  K 3.3* 3.6  CL 105 104  CO2 25 25  GLUCOSE 97 109*  BUN 15 16  CREATININE 0.52 0.43*  CALCIUM 8.7* 8.5*  MG 1.7  --    Liver Function Tests: No results for input(s): AST, ALT, ALKPHOS, BILITOT, PROT, ALBUMIN in the last 72 hours.   CBC:  Recent Labs  08/10/17 0451 08/11/17 0537 08/12/17 0523  WBC 12.0*  --   --   NEUTROABS 10.4*  --   --   HGB 10.2* 9.6* 9.8*  HCT 34.3* 32.6* 33.2*  MCV 80.5  --   --   PLT 177  --   --     No results found.    Medications:   Impression:  Principal Problem:   Pulmonary emboli (HCC) Active Problems:   Essential hypertension   Coronary artery disease   S/P MVR (mitral valve repair)   Ejection fraction < 50%   S/P CABG x 4   Complete heart block (HCC)   Pacemaker   Acute respiratory failure with hypoxia (HCC)     Plan: Tramadol 50 mg by mouth 3 times a day when  necessary for his pain. Avoid NSAID's. Out of bed to chair 3 times a day. Physical therapy for strengthening. Patient so week I believe she requires SNF rehabilitation stay. Check hemoglobin every day for 3 days  Consultants: Cardiology   Procedures   Antibiotics: Vancomycin and aztreonam           Time spent: 30 minutes   LOS: 4 days   [name] M   08/12/2017, 11:12 AM

## 2017-08-12 NOTE — Progress Notes (Signed)
Pt arrived to room 318 from the ICU. Pt oriented to room and call bell. Family at bedside.

## 2017-08-12 NOTE — Progress Notes (Signed)
Report to Loletta Specter RN

## 2017-08-12 NOTE — Care Management (Signed)
Xarelto voucher given to patient. Son present. Patient currently on 5 liters, no oxygen PTA. Patient recommended for SNF.

## 2017-08-12 NOTE — Care Management Important Message (Signed)
Important Message  Patient Details  Name: [name] MRN: [id number] Date of Birth: 12/12/1932   Medicare Important Message Given:  Yes    Ofelia Podolski, Chrystine Oiler, RN 08/12/2017, 8:32 AM

## 2017-08-12 NOTE — Care Management Note (Signed)
Case Management Note  Patient Details  Name: [name] MRN: [id number] Date of Birth: 05/23/1932  Subjective/Objective: Adm with PE/DVT. From home alone. Active with [facility]. Recent stay at [facility] [place]. Recommended for SNF.                   Action/Plan: Xarelto voucher given. Clydie Braun of [facility] aware of patient admission and SNF recommendation. Patient currently on 5L and unable to participate much with PT. CM following for needs. Will place resumption order for [facility] if needed.   Expected Discharge Date:                  Expected Discharge Plan:  Skilled Nursing Facility  In-House Referral:  Clinical Social Work  Discharge planning Services  CM Consult  Post Acute Care Choice:    Choice offered to:     DME Arranged:    DME Agency:     HH Arranged:   [facility] HH Agency:     Status of Service:  In process, will continue to follow  If discussed at Long Length of Stay Meetings, dates discussed:    Additional Comments:  Hyun Marsalis, Chrystine Oiler, RN 08/12/2017, 2:04 PM

## 2017-08-12 NOTE — Evaluation (Signed)
Physical Therapy Evaluation Patient Details Name: [name] MRN: [id number] DOB: Jan 18, 1932 Today's Date: 08/12/2017   History of Present Illness  [name] is a 83 y.o. female with medical history significant for hypertension, type 2 diabetes mellitus, and coronary artery disease status post CABG,S/P im NAIL RIGHT FEMUR FX 6/18, aDMITTED8/13/18  with shortness of breath. diagnosed with Acute submassive pulmonary embolism of right main PA and distal vessels with more chronic appearing pulmonary emboli in left lung with bilateral DVT: She has a h/o anemia ,post-operative atrial fibrillation. T 9 compression fracture  .  Clinical Impression  The patient presents with decreased tolerance to mobility, sitting at the bed edge and especially returning to supine due to complaints of significant back pain, also pain of left hand which does not allow weight bear through it. . The patient is unable to stand at this time. [name] PT [phone]     Follow Up Recommendations SNF;Supervision/Assistance - 24 hour the  Patient does not desire SNF. No family present.    Equipment Recommendations  None recommended by PT    Recommendations for Other Services       Precautions / Restrictions Precautions Precautions: Fall Precaution Comments: monitor Oxygen HR on 5 l [place] Required Braces or Orthoses: Spinal Brace;Other Brace/Splint Other Brace/Splint: kyphoextension brace that she reports donning in standing PTA      Mobility  Bed Mobility Overal bed mobility: Needs Assistance Bed Mobility: Supine to Sit;Sit to Supine     Supine to sit: Max assist Sit to supine: Total assist   General bed mobility comments: HOB raised, use of bed pad to slide patient to bed edge, assist with legs and trunk.  Assist with trunk and legs to return to supine. significant pain whenlying back  Transfers                 General transfer comment: unable  due to DOE and pain and  weakness.  Ambulation/Gait                Stairs            Wheelchair Mobility    Modified Rankin (Stroke Patients Only)       Balance Overall balance assessment: History of Falls;Needs assistance Sitting-balance support: Feet supported;Bilateral upper extremity supported Sitting balance-Leahy Scale: Fair Sitting balance - Comments: sits a t bed edge with no support x 10 minutes                                     Pertinent Vitals/Pain Pain Assessment: Faces Faces Pain Scale: Hurts worst Pain Location: back, left finger, left knee Pain Descriptors / Indicators: Moaning;Grimacing;Guarding;Penetrating;Jabbing Pain Intervention(s): Premedicated before session;Repositioned;Patient requesting pain meds-RN notified    Home Living Family/patient expects to be discharged to:: Private residence Living Arrangements: Alone Available Help at Discharge: Family;Available 24 hours/day Type of Home: House Home Access: Ramped entrance     Home Layout: One level Home Equipment: Grab bars - toilet;Walker - 4 wheels;Walker - 2 wheels Additional Comments: Daughters spend the night with her and are there during the day sometimes. Daughter is Runner, broadcasting/film/video and can stay with some over the summer    Prior Function Level of Independence: Needs assistance   Gait / Transfers Assistance Needed: since return from rehab for hip fracture, family stays with patient . Ambulated with RW and without. Has HHPT  Hand Dominance        Extremity/Trunk Assessment   Upper Extremity Assessment Upper Extremity Assessment: Generalized weakness    Lower Extremity Assessment Lower Extremity Assessment: RLE deficits/detail;LLE deficits/detail    Cervical / Trunk Assessment Cervical / Trunk Assessment: Kyphotic  Communication   Communication: No difficulties  Cognition Arousal/Alertness: Awake/alert Behavior During Therapy: WFL for tasks assessed/performed Overall  Cognitive Status: Within Functional Limits for tasks assessed                                        General Comments      Exercises     Assessment/Plan    PT Assessment Patient needs continued PT services  PT Problem List Decreased strength;Decreased range of motion;Decreased activity tolerance;Decreased balance;Decreased mobility;Decreased knowledge of precautions;Decreased safety awareness;Decreased knowledge of use of DME;Pain       PT Treatment Interventions DME instruction;Gait training;Functional mobility training;Therapeutic activities;Therapeutic exercise;Patient/family education    PT Goals (Current goals can be found in the Care Plan section)  Acute Rehab PT Goals Patient Stated Goal: i want to go home not reahb PT Goal Formulation: With patient Time For Goal Achievement: 08/26/17 Potential to Achieve Goals: Fair    Frequency Min 2X/week   Barriers to discharge        Co-evaluation               AM-PAC PT "6 Clicks" Daily Activity  Outcome Measure Difficulty turning over in bed (including adjusting bedclothes, sheets and blankets)?: A Lot Difficulty moving from lying on back to sitting on the side of the bed? : A Lot Difficulty sitting down on and standing up from a chair with arms (e.g., wheelchair, bedside commode, etc,.)?: A Lot Help needed moving to and from a bed to chair (including a wheelchair)?: Total Help needed walking in hospital room?: Total Help needed climbing 3-5 steps with a railing? : Total 6 Click Score: 9    End of Session   Activity Tolerance: Patient tolerated treatment well;Patient limited by pain;Patient limited by fatigue Patient left: in bed;with call bell/phone within reach;with bed alarm set Nurse Communication: Mobility status PT Visit Diagnosis: Difficulty in walking, not elsewhere classified (R26.2);Pain Pain - Right/Left: Left Pain - part of body: Hand;Knee    Time: 4680-3212 PT Time Calculation  (min) (ACUTE ONLY): 43 min   Charges:   PT Evaluation $PT Eval Moderate Complexity: 1 Mod PT Treatments $Therapeutic Activity: 23-37 mins   PT G CodesBlanchard Kelch PT [phone]   [name] 08/12/2017, 12:28 PM

## 2017-08-13 LAB — GLUCOSE, CAPILLARY
GLUCOSE-CAPILLARY: 141 mg/dL — AB (ref 65–99)
GLUCOSE-CAPILLARY: 142 mg/dL — AB (ref 65–99)
Glucose-Capillary: 147 mg/dL — ABNORMAL HIGH (ref 65–99)
Glucose-Capillary: 182 mg/dL — ABNORMAL HIGH (ref 65–99)

## 2017-08-13 LAB — BASIC METABOLIC PANEL
ANION GAP: 6 (ref 5–15)
BUN: 16 mg/dL (ref 6–20)
CO2: 27 mmol/L (ref 22–32)
Calcium: 8.5 mg/dL — ABNORMAL LOW (ref 8.9–10.3)
Chloride: 104 mmol/L (ref 101–111)
Creatinine, Ser: 0.43 mg/dL — ABNORMAL LOW (ref 0.44–1.00)
GFR calc Af Amer: >60 mL/min (ref >60)
GLUCOSE: 129 mg/dL — AB (ref 65–99)
POTASSIUM: 3.9 mmol/L (ref 3.5–5.1)
SODIUM: 137 mmol/L (ref 135–145)

## 2017-08-13 LAB — HEMOGLOBIN AND HEMATOCRIT, BLOOD
HCT: 30.8 % — ABNORMAL LOW (ref 36.0–46.0)
HEMOGLOBIN: 9 g/dL — AB (ref 12.0–15.0)

## 2017-08-13 LAB — CULTURE, BLOOD (ROUTINE X 2)
Culture: NO GROWTH
Culture: NO GROWTH
SPECIAL REQUESTS: ADEQUATE
SPECIAL REQUESTS: ADEQUATE

## 2017-08-13 NOTE — Progress Notes (Signed)
Patient c/o pain to the buttocks. Patient has a small open area to the mid buttocks, cleansed and patted dry, Allevyn dressing applied. Patient and family state patient has had pressure injury prior to hospitalization. Pressure injury staged II. Patient has been rotated onto her right side and propped with a pillow, relieving pressure.

## 2017-08-13 NOTE — Progress Notes (Signed)
Increased oxygen back to 5 lpm saturation dropped to 91 on 3 lpm. Because of PE causing heart strain did not wish to chance more work on patients heart.

## 2017-08-13 NOTE — Progress Notes (Signed)
Subjective: Patient feels better. Her breathing is improving,. She is on anticoagulation. No GI bleed. Her H/H has been stable.  Objective: Vital signs in last 24 hours: Temp:  [98 F (36.7 C)-98.9 F (37.2 C)] 98.9 F (37.2 C) (08/18 0439) Pulse Rate:  [92-100] 100 (08/18 0439) Resp:  [18-22] 18 (08/18 0439) BP: (114-137)/(58-68) 130/68 (08/18 0439) SpO2:  [96 %-97 %] 97 % (08/18 0439) Weight change:  Last BM Date: 08/10/17  Intake/Output from previous day: 08/17 0701 - 08/18 0700 In: 600 [P.O.:600] Out: 400 [Urine:400]  PHYSICAL EXAM General appearance: alert and no distress Resp: diminished breath sounds bilaterally and rhonchi bilaterally Cardio: S1, S2 normal GI: soft, non-tender; bowel sounds normal; no masses,  no organomegaly Extremities: extremities normal, atraumatic, no cyanosis or edema  Lab Results:  Results for orders placed or performed during the hospital encounter of 08/08/17 (from the past 48 hour(s))  Glucose, capillary     Status: Abnormal   Collection Time: 08/11/17 11:21 AM  Result Value Ref Range   Glucose-Capillary 154 (H) 65 - 99 mg/dL  Glucose, capillary     Status: Abnormal   Collection Time: 08/11/17  4:12 PM  Result Value Ref Range   Glucose-Capillary 146 (H) 65 - 99 mg/dL  Glucose, capillary     Status: Abnormal   Collection Time: 08/11/17  7:53 PM  Result Value Ref Range   Glucose-Capillary 159 (H) 65 - 99 mg/dL  Basic metabolic panel     Status: Abnormal   Collection Time: 08/12/17  5:23 AM  Result Value Ref Range   Sodium 137 135 - 145 mmol/L   Potassium 3.6 3.5 - 5.1 mmol/L   Chloride 104 101 - 111 mmol/L   CO2 25 22 - 32 mmol/L   Glucose, Bld 109 (H) 65 - 99 mg/dL   BUN 16 6 - 20 mg/dL   Creatinine, Ser 0.43 (L) 0.44 - 1.00 mg/dL   Calcium 8.5 (L) 8.9 - 10.3 mg/dL   GFR calc non Af Amer >60 >60 mL/min   GFR calc Af Amer >60 >60 mL/min    Comment: (NOTE) The eGFR has been calculated using the CKD EPI equation. This  calculation has not been validated in all clinical situations. eGFR's persistently <60 mL/min signify possible Chronic Kidney Disease.    Anion gap 8 5 - 15  Hemoglobin and hematocrit, blood     Status: Abnormal   Collection Time: 08/12/17  5:23 AM  Result Value Ref Range   Hemoglobin 9.8 (L) 12.0 - 15.0 g/dL   HCT 33.2 (L) 36.0 - 46.0 %  Glucose, capillary     Status: Abnormal   Collection Time: 08/12/17  8:01 AM  Result Value Ref Range   Glucose-Capillary 113 (H) 65 - 99 mg/dL  Glucose, capillary     Status: Abnormal   Collection Time: 08/12/17 11:13 AM  Result Value Ref Range   Glucose-Capillary 156 (H) 65 - 99 mg/dL  Glucose, capillary     Status: Abnormal   Collection Time: 08/12/17  4:54 PM  Result Value Ref Range   Glucose-Capillary 133 (H) 65 - 99 mg/dL   Comment 1 Notify RN    Comment 2 Document in Chart   Glucose, capillary     Status: Abnormal   Collection Time: 08/12/17  9:42 PM  Result Value Ref Range   Glucose-Capillary 129 (H) 65 - 99 mg/dL   Comment 1 Notify RN    Comment 2 Document in Chart   Basic metabolic panel  Status: Abnormal   Collection Time: 08/13/17  6:02 AM  Result Value Ref Range   Sodium 137 135 - 145 mmol/L   Potassium 3.9 3.5 - 5.1 mmol/L   Chloride 104 101 - 111 mmol/L   CO2 27 22 - 32 mmol/L   Glucose, Bld 129 (H) 65 - 99 mg/dL   BUN 16 6 - 20 mg/dL   Creatinine, Ser 0.43 (L) 0.44 - 1.00 mg/dL   Calcium 8.5 (L) 8.9 - 10.3 mg/dL   GFR calc non Af Amer >60 >60 mL/min   GFR calc Af Amer >60 >60 mL/min    Comment: (NOTE) The eGFR has been calculated using the CKD EPI equation. This calculation has not been validated in all clinical situations. eGFR's persistently <60 mL/min signify possible Chronic Kidney Disease.    Anion gap 6 5 - 15  Hemoglobin and hematocrit, blood     Status: Abnormal   Collection Time: 08/13/17  6:02 AM  Result Value Ref Range   Hemoglobin 9.0 (L) 12.0 - 15.0 g/dL   HCT 30.8 (L) 36.0 - 46.0 %  Glucose,  capillary     Status: Abnormal   Collection Time: 08/13/17  7:36 AM  Result Value Ref Range   Glucose-Capillary 141 (H) 65 - 99 mg/dL   Comment 1 Notify RN    Comment 2 Document in Chart     ABGS No results for input(s): PHART, PO2ART, TCO2, HCO3 in the last 72 hours.  Invalid input(s): PCO2 CULTURES Recent Results (from the past 240 hour(s))  Blood Culture (routine x 2)     Status: None   Collection Time: 08/08/17  5:51 PM  Result Value Ref Range Status   Specimen Description RIGHT ANTECUBITAL  Final   Special Requests   Final    BOTTLES DRAWN AEROBIC AND ANAEROBIC Blood Culture adequate volume   Culture NO GROWTH 5 DAYS  Final   Report Status 08/13/2017 FINAL  Final  Blood Culture (routine x 2)     Status: None   Collection Time: 08/08/17  6:01 PM  Result Value Ref Range Status   Specimen Description BLOOD RIGHT ARM  Final   Special Requests   Final    BOTTLES DRAWN AEROBIC AND ANAEROBIC Blood Culture adequate volume   Culture NO GROWTH 5 DAYS  Final   Report Status 08/13/2017 FINAL  Final  MRSA PCR Screening     Status: None   Collection Time: 08/08/17 11:29 PM  Result Value Ref Range Status   MRSA by PCR NEGATIVE NEGATIVE Final    Comment:        The GeneXpert MRSA Assay (FDA approved for NASAL specimens only), is one component of a comprehensive MRSA colonization surveillance program. It is not intended to diagnose MRSA infection nor to guide or monitor treatment for MRSA infections.    Studies/Results: No results found.  Medications: I have reviewed the patient's current medications.  Assesment:  Principal Problem:   Pulmonary emboli (HCC) Active Problems:   Essential hypertension   Coronary artery disease   S/P MVR (mitral valve repair)   Ejection fraction < 50%   S/P CABG x 4   Complete heart block (HCC)   Pacemaker   Acute respiratory failure with hypoxia (HCC)    Plan:  Medications reviewed Continue anticoagulation Will continue to  monitor CBC Continue regular treatment.    LOS: 5 days   Kaelie Henigan 08/13/2017, 11:06 AM

## 2017-08-14 DIAGNOSIS — L8990 Pressure ulcer of unspecified site, unspecified stage: Secondary | ICD-10-CM | POA: Insufficient documentation

## 2017-08-14 LAB — CBC
HCT: 33 % — ABNORMAL LOW (ref 36.0–46.0)
Hemoglobin: 9.7 g/dL — ABNORMAL LOW (ref 12.0–15.0)
MCH: 23.5 pg — ABNORMAL LOW (ref 26.0–34.0)
MCHC: 29.4 g/dL — ABNORMAL LOW (ref 30.0–36.0)
MCV: 79.9 fL (ref 78.0–100.0)
PLATELETS: 318 10*3/uL (ref 150–400)
RBC: 4.13 MIL/uL (ref 3.87–5.11)
RDW: 17.2 % — AB (ref 11.5–15.5)
WBC: 8.2 10*3/uL (ref 4.0–10.5)

## 2017-08-14 LAB — GLUCOSE, CAPILLARY
GLUCOSE-CAPILLARY: 191 mg/dL — AB (ref 65–99)
GLUCOSE-CAPILLARY: 162 mg/dL — AB (ref 65–99)
Glucose-Capillary: 124 mg/dL — ABNORMAL HIGH (ref 65–99)
Glucose-Capillary: 153 mg/dL — ABNORMAL HIGH (ref 65–99)

## 2017-08-14 NOTE — Progress Notes (Signed)
Subjective: Patient is resting. She feels better and improving. No GI symptoms..  Objective: Vital signs in last 24 hours: Temp:  [98.5 F (36.9 C)-99 F (37.2 C)] 98.7 F (37.1 C) (08/19 0509) Pulse Rate:  [87-91] 87 (08/19 0509) Resp:  [16-18] 18 (08/19 0509) BP: (120-133)/(60-66) 133/60 (08/19 0509) SpO2:  [92 %-98 %] 98 % (08/19 0509) Weight change:  Last BM Date: 08/10/17  Intake/Output from previous day: 08/18 0701 - 08/19 0700 In: 2468.5 [P.O.:1080; I.V.:488.5; IV Piggyback:900] Out: 1050 [Urine:1050]  PHYSICAL EXAM General appearance: alert and no distress Resp: diminished breath sounds bilaterally and rhonchi bilaterally Cardio: S1, S2 normal GI: soft, non-tender; bowel sounds normal; no masses,  no organomegaly Extremities: extremities normal, atraumatic, no cyanosis or edema  Lab Results:  Results for orders placed or performed during the hospital encounter of 08/08/17 (from the past 48 hour(s))  Glucose, capillary     Status: Abnormal   Collection Time: 08/12/17 11:13 AM  Result Value Ref Range   Glucose-Capillary 156 (H) 65 - 99 mg/dL  Glucose, capillary     Status: Abnormal   Collection Time: 08/12/17  4:54 PM  Result Value Ref Range   Glucose-Capillary 133 (H) 65 - 99 mg/dL   Comment 1 Notify RN    Comment 2 Document in Chart   Glucose, capillary     Status: Abnormal   Collection Time: 08/12/17  9:42 PM  Result Value Ref Range   Glucose-Capillary 129 (H) 65 - 99 mg/dL   Comment 1 Notify RN    Comment 2 Document in Chart   Basic metabolic panel     Status: Abnormal   Collection Time: 08/13/17  6:02 AM  Result Value Ref Range   Sodium 137 135 - 145 mmol/L   Potassium 3.9 3.5 - 5.1 mmol/L   Chloride 104 101 - 111 mmol/L   CO2 27 22 - 32 mmol/L   Glucose, Bld 129 (H) 65 - 99 mg/dL   BUN 16 6 - 20 mg/dL   Creatinine, Ser 0.43 (L) 0.44 - 1.00 mg/dL   Calcium 8.5 (L) 8.9 - 10.3 mg/dL   GFR calc non Af Amer >60 >60 mL/min   GFR calc Af Amer >60 >60  mL/min    Comment: (NOTE) The eGFR has been calculated using the CKD EPI equation. This calculation has not been validated in all clinical situations. eGFR's persistently <60 mL/min signify possible Chronic Kidney Disease.    Anion gap 6 5 - 15  Hemoglobin and hematocrit, blood     Status: Abnormal   Collection Time: 08/13/17  6:02 AM  Result Value Ref Range   Hemoglobin 9.0 (L) 12.0 - 15.0 g/dL   HCT 30.8 (L) 36.0 - 46.0 %  Glucose, capillary     Status: Abnormal   Collection Time: 08/13/17  7:36 AM  Result Value Ref Range   Glucose-Capillary 141 (H) 65 - 99 mg/dL   Comment 1 Notify RN    Comment 2 Document in Chart   Glucose, capillary     Status: Abnormal   Collection Time: 08/13/17 11:15 AM  Result Value Ref Range   Glucose-Capillary 147 (H) 65 - 99 mg/dL   Comment 1 Notify RN    Comment 2 Document in Chart   Glucose, capillary     Status: Abnormal   Collection Time: 08/13/17  4:46 PM  Result Value Ref Range   Glucose-Capillary 182 (H) 65 - 99 mg/dL   Comment 1 Notify RN    Comment  2 Document in Chart   Glucose, capillary     Status: Abnormal   Collection Time: 08/13/17  8:41 PM  Result Value Ref Range   Glucose-Capillary 142 (H) 65 - 99 mg/dL   Comment 1 Notify RN    Comment 2 Document in Chart   Glucose, capillary     Status: Abnormal   Collection Time: 08/14/17  7:30 AM  Result Value Ref Range   Glucose-Capillary 124 (H) 65 - 99 mg/dL   Comment 1 Notify RN    Comment 2 Document in Chart   CBC     Status: Abnormal   Collection Time: 08/14/17  8:48 AM  Result Value Ref Range   WBC 8.2 4.0 - 10.5 K/uL   RBC 4.13 3.87 - 5.11 MIL/uL   Hemoglobin 9.7 (L) 12.0 - 15.0 g/dL   HCT 33.0 (L) 36.0 - 46.0 %   MCV 79.9 78.0 - 100.0 fL   MCH 23.5 (L) 26.0 - 34.0 pg   MCHC 29.4 (L) 30.0 - 36.0 g/dL   RDW 17.2 (H) 11.5 - 15.5 %   Platelets 318 150 - 400 K/uL    ABGS No results for input(s): PHART, PO2ART, TCO2, HCO3 in the last 72 hours.  Invalid input(s):  PCO2 CULTURES Recent Results (from the past 240 hour(s))  Blood Culture (routine x 2)     Status: None   Collection Time: 08/08/17  5:51 PM  Result Value Ref Range Status   Specimen Description RIGHT ANTECUBITAL  Final   Special Requests   Final    BOTTLES DRAWN AEROBIC AND ANAEROBIC Blood Culture adequate volume   Culture NO GROWTH 5 DAYS  Final   Report Status 08/13/2017 FINAL  Final  Blood Culture (routine x 2)     Status: None   Collection Time: 08/08/17  6:01 PM  Result Value Ref Range Status   Specimen Description BLOOD RIGHT ARM  Final   Special Requests   Final    BOTTLES DRAWN AEROBIC AND ANAEROBIC Blood Culture adequate volume   Culture NO GROWTH 5 DAYS  Final   Report Status 08/13/2017 FINAL  Final  MRSA PCR Screening     Status: None   Collection Time: 08/08/17 11:29 PM  Result Value Ref Range Status   MRSA by PCR NEGATIVE NEGATIVE Final    Comment:        The GeneXpert MRSA Assay (FDA approved for NASAL specimens only), is one component of a comprehensive MRSA colonization surveillance program. It is not intended to diagnose MRSA infection nor to guide or monitor treatment for MRSA infections.    Studies/Results: No results found.  Medications: I have reviewed the patient's current medications.  Assesment:  Principal Problem:   Pulmonary emboli (HCC) Active Problems:   Essential hypertension   Coronary artery disease   S/P MVR (mitral valve repair)   Ejection fraction < 50%   S/P CABG x 4   Complete heart block (HCC)   Pacemaker   Acute respiratory failure with hypoxia (HCC)   Pressure injury of skin    Plan:  Medications reviewed Continue anticoagulation Will continue to monitor CBC Continue regular treatment.    LOS: 6 days   [name] 08/14/2017, 10:55 AM

## 2017-08-15 LAB — BASIC METABOLIC PANEL
Anion gap: 6 (ref 5–15)
BUN: 14 mg/dL (ref 6–20)
CALCIUM: 8.5 mg/dL — AB (ref 8.9–10.3)
CO2: 29 mmol/L (ref 22–32)
Chloride: 101 mmol/L (ref 101–111)
Creatinine, Ser: 0.4 mg/dL — ABNORMAL LOW (ref 0.44–1.00)
Glucose, Bld: 131 mg/dL — ABNORMAL HIGH (ref 65–99)
Potassium: 3.7 mmol/L (ref 3.5–5.1)
SODIUM: 136 mmol/L (ref 135–145)

## 2017-08-15 LAB — CBC
HCT: 30.7 % — ABNORMAL LOW (ref 36.0–46.0)
Hemoglobin: 9 g/dL — ABNORMAL LOW (ref 12.0–15.0)
MCH: 23.4 pg — ABNORMAL LOW (ref 26.0–34.0)
MCHC: 29.3 g/dL — ABNORMAL LOW (ref 30.0–36.0)
MCV: 79.9 fL (ref 78.0–100.0)
PLATELETS: 377 10*3/uL (ref 150–400)
RBC: 3.84 MIL/uL — AB (ref 3.87–5.11)
RDW: 17.2 % — AB (ref 11.5–15.5)
WBC: 7.7 10*3/uL (ref 4.0–10.5)

## 2017-08-15 LAB — GLUCOSE, CAPILLARY
GLUCOSE-CAPILLARY: 149 mg/dL — AB (ref 65–99)
GLUCOSE-CAPILLARY: 177 mg/dL — AB (ref 65–99)
GLUCOSE-CAPILLARY: 147 mg/dL — AB (ref 65–99)
GLUCOSE-CAPILLARY: 149 mg/dL — AB (ref 65–99)

## 2017-08-15 LAB — HEMOGLOBIN AND HEMATOCRIT, BLOOD
HEMATOCRIT: 30.9 % — AB (ref 36.0–46.0)
Hemoglobin: 9 g/dL — ABNORMAL LOW (ref 12.0–15.0)

## 2017-08-15 LAB — VANCOMYCIN, TROUGH: VANCOMYCIN TR: 9 ug/mL — AB (ref 15–20)

## 2017-08-15 MED ORDER — VANCOMYCIN HCL IN DEXTROSE 1-5 GM/200ML-% IV SOLN
1000.0000 mg | Freq: Two times a day (BID) | INTRAVENOUS | Status: DC
  Administered 2017-08-15 – 2017-08-16 (×3): 1000 mg via INTRAVENOUS
  Filled 2017-08-15 (×3): qty 200

## 2017-08-15 NOTE — Progress Notes (Signed)
ANTIBIOTIC CONSULT NOTE - follow up  Pharmacy Consult for Vancomycin and Aztreonam Indication: sepsis  Allergies  Allergen Reactions  . Lisinopril Anaphylaxis  . Quinine Other (See Comments)    Pt states heart attack-like symptoms  . Penicillins Swelling and Other (See Comments)    Patient states that she had swelling at the injection site after her last Penicillin shot but no swelling in the throat.  . Sulfonamide Derivatives Rash   Patient Measurements: Height: 5\' 8"  (172.7 cm) Weight: 154 lb 8.7 oz (70.1 kg) IBW/kg (Calculated) : 63.9  Vital Signs: Temp: 98.9 F (37.2 C) (08/20 0545) Temp Source: Oral (08/20 0545) BP: 130/61 (08/20 0545) Pulse Rate: 89 (08/20 0545) Intake/Output from previous day: 08/19 0701 - 08/20 0700 In: 960 [P.O.:720; I.V.:90; IV Piggyback:150] Out: 450 [Urine:450] Intake/Output from this shift: No intake/output data recorded.  Labs:  Recent Labs  08/13/17 0602 08/14/17 0848 08/15/17 0651  WBC  --  8.2 7.7  HGB 9.0* 9.7* 9.0*  9.0*  PLT  --  318 377  CREATININE 0.43*  --  0.40*   Estimated Creatinine Clearance: 53.7 mL/min (A) (by C-G formula based on SCr of 0.4 mg/dL (L)).  Recent Labs  08/15/17 0651  VANCOTROUGH 9*    Microbiology: Recent Results (from the past 720 hour(s))  Blood Culture (routine x 2)     Status: None   Collection Time: 08/08/17  5:51 PM  Result Value Ref Range Status   Specimen Description RIGHT ANTECUBITAL  Final   Special Requests   Final    BOTTLES DRAWN AEROBIC AND ANAEROBIC Blood Culture adequate volume   Culture NO GROWTH 5 DAYS  Final   Report Status 08/13/2017 FINAL  Final  Blood Culture (routine x 2)     Status: None   Collection Time: 08/08/17  6:01 PM  Result Value Ref Range Status   Specimen Description BLOOD RIGHT ARM  Final   Special Requests   Final    BOTTLES DRAWN AEROBIC AND ANAEROBIC Blood Culture adequate volume   Culture NO GROWTH 5 DAYS  Final   Report Status 08/13/2017 FINAL   Final  MRSA PCR Screening     Status: None   Collection Time: 08/08/17 11:29 PM  Result Value Ref Range Status   MRSA by PCR NEGATIVE NEGATIVE Final    Comment:        The GeneXpert MRSA Assay (FDA approved for NASAL specimens only), is one component of a comprehensive MRSA colonization surveillance program. It is not intended to diagnose MRSA infection nor to guide or monitor treatment for MRSA infections.    Medical History: Past Medical History:  Diagnosis Date  . CAD (coronary artery disease)    a. s/p PCI to LAD '08, Severe 3V CAD by cath 03/27/12 --> CABG  . CHF (congestive heart failure) (HCC)   . Complete heart block (HCC)    a. s/p STJ dual chamber pacemaker  . Depression   . Hypertension   . Hypothyroidism   . Mitral regurgitation    moderate by cath, mild by echo 03/2012  . Osteoarthrosis, unspecified whether generalized or localized, unspecified site   . Paroxysmal atrial fibrillation (HCC)    a. identified s/p CABG b. OAC discontinued 02/2014 after no recurrence  . Personal history of other diseases of digestive system   . Postoperative anemia due to acute blood loss 06/04/2017  . Type II or unspecified type diabetes mellitus without mention of complication, not stated as uncontrolled   . Unspecified  glaucoma(365.9)   . Unspecified hemorrhoids without mention of complication    Medications:  Prescriptions Prior to Admission  Medication Sig Dispense Refill Last Dose  . amLODipine (NORVASC) 5 MG tablet Take 5 mg by mouth daily.  5 08/08/2017 at Unknown time  . carvedilol (COREG) 6.25 MG tablet TAKE ONE TABLET BY MOUTH TWICE DAILY 60 tablet 11 08/08/2017 at 800a  . FLUoxetine (PROZAC) 20 MG capsule Take 20 mg by mouth daily.    08/08/2017 at Unknown time  . metFORMIN (GLUCOPHAGE) 500 MG tablet Take 500 mg by mouth daily with breakfast.   3 08/08/2017 at Unknown time  . omeprazole (PRILOSEC) 20 MG capsule Take 20 mg by mouth daily.    08/08/2017 at Unknown time  .  oxycodone (OXY-IR) 5 MG capsule Take 5-10 mg by mouth every 6 (six) hours as needed for pain.   08/08/2017 at Unknown time  . polyethylene glycol powder (MIRALAX) powder Take 17 g by mouth daily as needed.   Past Week at Unknown time  . aspirin EC 325 MG tablet Take 1 tablet (325 mg total) by mouth daily. For 30 days post op for DVT Prophylaxis (Patient not taking: Reported on 08/08/2017) 30 tablet 0 Not Taking at Unknown time  . docusate sodium (COLACE) 100 MG capsule Take 1 capsule (100 mg total) by mouth 2 (two) times daily. To prevent constipation while taking pain medication. (Patient not taking: Reported on 08/08/2017) 60 capsule 0 Not Taking at Unknown time  . feeding supplement, GLUCERNA SHAKE, (GLUCERNA SHAKE) LIQD Take 237 mLs by mouth 2 (two) times daily between meals. 30 Can 0   . HYDROcodone-acetaminophen (NORCO) 5-325 MG tablet Take 1-2 tablets by mouth every 6 (six) hours as needed for moderate pain. (Patient not taking: Reported on 08/08/2017) 40 tablet 0 Not Taking at Unknown time  . INVOKANA 300 MG TABS tablet Take 300 mg by mouth daily.  5 06/01/2017 at Unknown time   Assessment: 83yo female admitted on 08/08/17 with sepsis.  Pt has been on Aztreonam and Vancomycin, trough level is below goal.   Goal of Therapy:  Vancomycin trough level 15-20 mcg/ml  Plan:  Increase Vancomycin to 1000mg  IV q12hrs Continue Aztreonam 1gm IV q8hrs Monitor labs, progress, c/s  Margo Aye, Osman Calzadilla A 08/15/2017,8:12 AM

## 2017-08-15 NOTE — Progress Notes (Signed)
Patient appears weak degrees to rehabilitation center stay at SNF. Currently on vancomycin and aztreonam will consider oral alternatives to this for pulmonary emboli patient placed on Xarelto for day 5 or 6 currently loading dose 15 twice a day Will decrease to 20 after loading period. [name] NWG:[id number] DOB: [birth year] DOA: 08/08/2017 PCP: [name], MD   Physical Exam: Blood pressure 130/61, pulse 89, temperature 98.9 F (37.2 C), temperature source Oral, resp. rate 20, height 5\' 8"  (1.727 [name]), weight 70.1 kg (154 lb 8.7 oz), SpO2 96 %. Lungs diminished breath sounds in the bases no rales wheeze rhonchi appreciable heart irregular rhythm no S3- no heaves thrills rubs   Investigations:  Recent Results (from the past 240 hour(s))  Blood Culture (routine x 2)     Status: None   Collection Time: 08/08/17  5:51 PM  Result Value Ref Range Status   Specimen Description RIGHT ANTECUBITAL  Final   Special Requests   Final    BOTTLES DRAWN AEROBIC AND ANAEROBIC Blood Culture adequate volume   Culture NO GROWTH 5 DAYS  Final   Report Status 08/13/2017 FINAL  Final  Blood Culture (routine x 2)     Status: None   Collection Time: 08/08/17  6:01 PM  Result Value Ref Range Status   Specimen Description BLOOD RIGHT ARM  Final   Special Requests   Final    BOTTLES DRAWN AEROBIC AND ANAEROBIC Blood Culture adequate volume   Culture NO GROWTH 5 DAYS  Final   Report Status 08/13/2017 FINAL  Final  MRSA PCR Screening     Status: None   Collection Time: 08/08/17 11:29 PM  Result Value Ref Range Status   MRSA by PCR NEGATIVE NEGATIVE Final    Comment:        The GeneXpert MRSA Assay (FDA approved for NASAL specimens only), is one component of a comprehensive MRSA colonization surveillance program. It is not intended to diagnose MRSA infection nor to guide or monitor treatment for MRSA infections.      Basic Metabolic Panel:  Recent Labs  57/84/69 0602 08/15/17 0651  NA  137 136  K 3.9 3.7  CL 104 101  CO2 27 29  GLUCOSE 129* 131*  BUN 16 14  CREATININE 0.43* 0.40*  CALCIUM 8.5* 8.5*   Liver Function Tests: No results for input(s): AST, ALT, ALKPHOS, BILITOT, PROT, ALBUMIN in the last 72 hours.   CBC:  Recent Labs  08/14/17 0848 08/15/17 0651  WBC 8.2 7.7  HGB 9.7* 9.0*  9.0*  HCT 33.0* 30.9*  30.7*  MCV 79.9 79.9  PLT 318 377    No results found.    Medications:   Impression:  Principal Problem:   Pulmonary emboli (HCC) Active Problems:   Essential hypertension   Coronary artery disease   S/P MVR (mitral valve repair)   Ejection fraction < 50%   S/P CABG x 4   Complete heart block (HCC)   Pacemaker   Acute respiratory failure with hypoxia (HCC)   Pressure injury of skin     Plan: Continue Xarelto for 7 days then decrease to regular anticoagulation dose. Monitor hemoglobin daily today. Monitor renal function. Consider transfer to skilled nursing facility for rehabilitation stay out of bed to chair and physical therapy  Consultants: Cardiology   Procedures   Antibiotics: Vancomycin aztreonam               Time spent: 30 minutes   LOS: 7 days  [name]   08/15/2017, 1:32 PM    [name]:[id number] DOB: 1932/01/08 DOA: 08/08/2017 PCP: [name], MD   Physical Exam: Blood pressure 130/61, pulse 89, temperature 98.9 F (37.2 C), temperature source Oral, resp. rate 20, height 5\' 8"  (1.727 [name]), weight 70.1 kg (154 lb 8.7 oz), SpO2 96 %. Lungs clear to A&P no rales wheeze rhonchi heart irregular regular no S3   Investigations:  Recent Results (from the past 240 hour(s))  Blood Culture (routine x 2)     Status: None   Collection Time: 08/08/17  5:51 PM  Result Value Ref Range Status   Specimen Description RIGHT ANTECUBITAL  Final   Special Requests   Final    BOTTLES DRAWN AEROBIC AND ANAEROBIC Blood Culture adequate volume   Culture NO GROWTH 5 DAYS  Final   Report Status  08/13/2017 FINAL  Final  Blood Culture (routine x 2)     Status: None   Collection Time: 08/08/17  6:01 PM  Result Value Ref Range Status   Specimen Description BLOOD RIGHT ARM  Final   Special Requests   Final    BOTTLES DRAWN AEROBIC AND ANAEROBIC Blood Culture adequate volume   Culture NO GROWTH 5 DAYS  Final   Report Status 08/13/2017 FINAL  Final  MRSA PCR Screening     Status: None   Collection Time: 08/08/17 11:29 PM  Result Value Ref Range Status   MRSA by PCR NEGATIVE NEGATIVE Final    Comment:        The GeneXpert MRSA Assay (FDA approved for NASAL specimens only), is one component of a comprehensive MRSA colonization surveillance program. It is not intended to diagnose MRSA infection nor to guide or monitor treatment for MRSA infections.      Basic Metabolic Panel:  Recent Labs  32/44/01 0602 08/15/17 0651  NA 137 136  K 3.9 3.7  CL 104 101  CO2 27 29  GLUCOSE 129* 131*  BUN 16 14  CREATININE 0.43* 0.40*  CALCIUM 8.5* 8.5*   Liver Function Tests: No results for input(s): AST, ALT, ALKPHOS, BILITOT, PROT, ALBUMIN in the last 72 hours.   CBC:  Recent Labs  08/14/17 0848 08/15/17 0651  WBC 8.2 7.7  HGB 9.7* 9.0*  9.0*  HCT 33.0* 30.9*  30.7*  MCV 79.9 79.9  PLT 318 377    No results found.    Medications:  Impression:  Principal Problem:   Pulmonary emboli (HCC) Active Problems:   Essential hypertension   Coronary artery disease   S/P MVR (mitral valve repair)   Ejection fraction < 50%   S/P CABG x 4   Complete heart block (HCC)   Pacemaker   Acute respiratory failure with hypoxia (HCC)   Pressure injury of skin     Plan: Continue Xarelto 15 twice a day continue vancomycin aztreonam monitor renal function SNF or rehabilitation stay  Consultants:   Procedures   Antibiotics: Vancomycin aztreonam          Time spent: 30 minutes   LOS: 7 days   [name]   08/15/2017, 1:32 PM

## 2017-08-16 ENCOUNTER — Inpatient Hospital Stay
Admission: RE | Admit: 2017-08-16 | Discharge: 2017-09-05 | Disposition: A | Discharge status: Home / Self Care | Payer: Medicare HMO | Source: Ambulatory Visit | Attending: Internal Medicine | Admitting: Internal Medicine

## 2017-08-16 LAB — CBC WITH DIFFERENTIAL/PLATELET
Basophils Absolute: 0 10*3/uL (ref 0.0–0.1)
Basophils Relative: 0 %
Eosinophils Absolute: 0.1 10*3/uL (ref 0.0–0.7)
Eosinophils Relative: 2 %
HEMATOCRIT: 32.1 % — AB (ref 36.0–46.0)
Hemoglobin: 9.5 g/dL — ABNORMAL LOW (ref 12.0–15.0)
LYMPHS PCT: 9 %
Lymphs Abs: 0.7 10*3/uL (ref 0.7–4.0)
MCH: 23.3 pg — AB (ref 26.0–34.0)
MCHC: 29.6 g/dL — AB (ref 30.0–36.0)
MCV: 78.7 fL (ref 78.0–100.0)
MONO ABS: 1 10*3/uL (ref 0.1–1.0)
MONOS PCT: 13 %
NEUTROS ABS: 5.7 10*3/uL (ref 1.7–7.7)
Neutrophils Relative %: 76 %
Platelets: 446 10*3/uL — ABNORMAL HIGH (ref 150–400)
RBC: 4.08 MIL/uL (ref 3.87–5.11)
RDW: 17.3 % — AB (ref 11.5–15.5)
WBC: 7.5 10*3/uL (ref 4.0–10.5)

## 2017-08-16 LAB — GLUCOSE, CAPILLARY
GLUCOSE-CAPILLARY: 136 mg/dL — AB (ref 65–99)
Glucose-Capillary: 151 mg/dL — ABNORMAL HIGH (ref 65–99)
Glucose-Capillary: 171 mg/dL — ABNORMAL HIGH (ref 65–99)

## 2017-08-16 LAB — BASIC METABOLIC PANEL
ANION GAP: 7 (ref 5–15)
BUN: 9 mg/dL (ref 6–20)
CO2: 29 mmol/L (ref 22–32)
Calcium: 8.5 mg/dL — ABNORMAL LOW (ref 8.9–10.3)
Chloride: 96 mmol/L — ABNORMAL LOW (ref 101–111)
Creatinine, Ser: 0.41 mg/dL — ABNORMAL LOW (ref 0.44–1.00)
GFR calc Af Amer: >60 mL/min (ref >60)
GFR calc non Af Amer: >60 mL/min (ref >60)
GLUCOSE: 134 mg/dL — AB (ref 65–99)
POTASSIUM: 3.8 mmol/L (ref 3.5–5.1)
Sodium: 132 mmol/L — ABNORMAL LOW (ref 135–145)

## 2017-08-16 MED ORDER — AZITHROMYCIN 250 MG PO TABS: ORAL_TABLET | ORAL | 0 refills | Status: DC

## 2017-08-16 MED ORDER — INVOKANA 300 MG PO TABS: 300.0000 mg | ORAL_TABLET | Freq: Every day | ORAL | 5 refills | Status: DC

## 2017-08-16 MED ORDER — OXYCODONE HCL 5 MG PO CAPS: 5.0000 mg | ORAL_CAPSULE | Freq: Four times a day (QID) | ORAL | 0 refills | Status: DC | PRN

## 2017-08-16 MED ORDER — AZITHROMYCIN 250 MG PO TABS
500.0000 mg | ORAL_TABLET | Freq: Once | ORAL | Status: AC
  Administered 2017-08-16: 500 mg via ORAL
  Filled 2017-08-16: qty 2

## 2017-08-16 MED ORDER — RIVAROXABAN 15 MG PO TABS: 15.0000 mg | ORAL_TABLET | Freq: Two times a day (BID) | ORAL | 0 refills | Status: DC

## 2017-08-16 NOTE — Care Management Important Message (Signed)
Important Message  Patient Details  Name: [name] MRN: [id number] Date of Birth: [birth year]   Medicare Important Message Given:  Yes    Malcolm Metro, RN 08/16/2017, 1:15 PM

## 2017-08-16 NOTE — Progress Notes (Signed)
Physical Therapy Treatment Patient Details Name: [name] MRN: [id number] DOB: 05-01-1932 Today's Date: 08/16/2017    History of Present Illness [name] is a 83 y.o. female with medical history significant for hypertension, type 2 diabetes mellitus, and coronary artery disease status post CABG,S/P im NAIL RIGHT FEMUR FX 6/18, aDMITTED8/13/18  with shortness of breath. diagnosed with Acute submassive pulmonary embolism of right main PA and distal vessels with more chronic appearing pulmonary emboli in left lung with bilateral DVT: She has a h/o anemia ,post-operative atrial fibrillation. T 9 compression fracture  .    PT Comments    Pt is up to side of bed with positioning of her posture and ck of O2 sats with drop to 87% siting from 95% at rest.  Her tolerance is low, sounds very tight in breathing and per daughter was titrated off O2 for her trip to SNF.  Her plan is to follow along if she remains overnight and will progress her mobility as tolerated.  O2 will need to be higher than 2L per min to tolerate rehab due to the amount of effort standing will be.   Follow Up Recommendations  SNF     Equipment Recommendations  None recommended by PT    Recommendations for Other Services       Precautions / Restrictions Precautions Precautions: Fall Precaution Comments: monitor Oxygen HR Required Braces or Orthoses: Spinal Brace;Other Brace/Splint Spinal Brace: Applied in sitting position Restrictions Weight Bearing Restrictions: No    Mobility  Bed Mobility Overal bed mobility: Needs Assistance Bed Mobility: Supine to Sit;Sit to Supine     Supine to sit: Max assist;Mod assist Sit to supine: Max assist   General bed mobility comments: used bed pad to roll and scoot out, reviewed body mechanics to get back to bed  Transfers                 General transfer comment: unable due to her O2 sats on 2L O2  Ambulation/Gait             General Gait Details:  unable   Stairs            Wheelchair Mobility    Modified Rankin (Stroke Patients Only)       Balance Overall balance assessment: History of Falls;Needs assistance Sitting-balance support: Feet supported;Bilateral upper extremity supported Sitting balance-Leahy Scale: Fair                                      Cognition Arousal/Alertness: Awake/alert Behavior During Therapy: WFL for tasks assessed/performed Overall Cognitive Status: Within Functional Limits for tasks assessed                                 General Comments: HOH during therapy session      Exercises      General Comments General comments (skin integrity, edema, etc.): Has O2 sats of 95% at rest then 87% with sitting effort, then max assist back to bed and recovered with [facility] elevated      Pertinent Vitals/Pain Pain Assessment: Faces Faces Pain Scale: Hurts even more Pain Descriptors / Indicators: Sore Pain Intervention(s): Limited activity within patient's tolerance;Monitored during session;Premedicated before session;Repositioned    Home Living  Prior Function            PT Goals (current goals can now be found in the care plan section) Acute Rehab PT Goals Patient Stated Goal: going to therapy today Progress towards PT goals: Progressing toward goals    Frequency    Min 2X/week      PT Plan Current plan remains appropriate    Co-evaluation              AM-PAC PT "6 Clicks" Daily Activity  Outcome Measure  Difficulty turning over in bed (including adjusting bedclothes, sheets and blankets)?: Unable Difficulty moving from lying on back to sitting on the side of the bed? : Unable Difficulty sitting down on and standing up from a chair with arms (e.g., wheelchair, bedside commode, etc,.)?: Unable Help needed moving to and from a bed to chair (including a wheelchair)?: Total Help needed walking in hospital room?:  Total Help needed climbing 3-5 steps with a railing? : Total 6 Click Score: 6    End of Session Equipment Utilized During Treatment: Oxygen Activity Tolerance: Patient limited by fatigue;Patient limited by pain;Treatment limited secondary to medical complications (Comment) Patient left: in bed;with call bell/phone within reach;with bed alarm set Nurse Communication: Mobility status PT Visit Diagnosis: Difficulty in walking, not elsewhere classified (R26.2);Pain Pain - Right/Left: Left Pain - part of body: Hand;Knee     Time: 4383-7793 PT Time Calculation (min) (ACUTE ONLY): 33 min  Charges:  $Therapeutic Activity: 23-37 mins                    G Codes:  Functional Assessment Tool Used: AM-PAC 6 Clicks Basic Mobility    Ivar Drape 08/16/2017, 5:52 PM   5:55 PM, 08/16/17 Samul Dada, PT, MS Physical Therapist -  [phone] (Office)

## 2017-08-16 NOTE — Progress Notes (Signed)
Report called and given to Idell Pickles, LPN at [facility].

## 2017-08-16 NOTE — Discharge Summary (Signed)
Physician Discharge Summary  [name]:[id number] DOB: 1932/03/17 DOA: 08/08/2017  PCP: [name], MD  Admit date: 08/08/2017 Discharge date: 08/16/2017   Recommendations for Outpatient Follow-up:  Patient is discharged to skilled nursing facility for rehabilitation stay. She said take Xarelto 15 mg by mouth twice a day for an additional 3 days and then decrease this dosage to 20 mg by mouth daily for DVT/PE E indefinitely. He is advised to monitor hemoglobin every 2-3 days to be certain there is no GI bleed which has occurred in the past requiring transfusions her hemoglobin at discharge is 9.0. She is to undergo physical therapy in hopes of returning home at her earliest opportunity Discharge Diagnoses:  Principal Problem:   Pulmonary emboli [facility]) Active Problems:   Essential hypertension   Coronary artery disease   S/P MVR (mitral valve repair)   Ejection fraction < 50%   S/P CABG x 4   Complete heart block (HCC)   Pacemaker   Acute respiratory failure with hypoxia (HCC)   Pressure injury of skin   Discharge Condition: Good  Filed Weights   08/08/17 2328 08/09/17 0500 08/10/17 0500  Weight: 70.3 kg (154 lb 15.7 oz) 70.3 kg (154 lb 15.7 oz) 70.1 kg (154 lb 8.7 oz)    History of present illness:  The patient is an 83 year old white female status post CABG 3, status post mitral valve repair, status post complete heart block with pacer inserted, hypertension who had the right hip repair in June 2018 and if her 30 day rehabilitation stay she had a GI bleed requiring transfusion she was given only full dose aspirin for his anticoagulation after her GI bleed by orthopedics and came in with bilateral DVT of the femoral popliteal systems with an acute PE the right mainstem bronchus which was submassive. Patient placed on IV heparin seen in consultation by cardiology who was carefully decided upon anticoagulation given the risk benefit ratio of 4 previous GI bleed along  with the acute PE. It was felt by cardiology to start her on Xarelto 15 mg by mouth twice a day for one week and then decrease to 20 mg per day. This agent was chosen as there is a reversible antidote had: Pharmacy which is nearby. She tolerated the anticoagulation well follow for 5 days and was subsequently discharged to skilled nursing facility to begin rehabilitation stay for strengthening and ambulation as well as to monitor hemoglobin 2-3 times per week to be certain there is no recurrence of GI bleed. Her hemoglobin was 9.0 day of discharge she was likewise given Zithromax 500 mg on the day of discharge in the hospital and urged to take to her 50 mg by mouth daily for 4 additional days at the skilled nursing facility  Hospital Course:  See history of present illness above  Procedures:     Consultations:  Cardiology and physical therapy  Discharge Instructions  Discharge Instructions    Discharge instructions    Complete by:  As directed    Discharge patient    Complete by:  As directed    Discharge disposition:  03-Skilled Nursing Facility   Discharge patient date:  08/16/2017     Allergies as of 08/16/2017      Reactions   Lisinopril Anaphylaxis   Quinine Other (See Comments)   Pt states heart attack-like symptoms   Penicillins Swelling, Other (See Comments)   Patient states that she had swelling at the injection site after her last Penicillin shot  but no swelling in the throat.   Sulfonamide Derivatives Rash      Medication List    STOP taking these medications   aspirin EC 325 MG tablet   FLUoxetine 20 MG capsule Commonly known as:  PROZAC   HYDROcodone-acetaminophen 5-325 MG tablet Commonly known as:  NORCO   metFORMIN 500 MG tablet Commonly known as:  GLUCOPHAGE     TAKE these medications   amLODipine 5 MG tablet Commonly known as:  NORVASC Take 5 mg by mouth daily.   azithromycin 250 MG tablet Commonly known as:  ZITHROMAX 250 Mg. P.O. Daily for 4  additional days then discontinue   carvedilol 6.25 MG tablet Commonly known as:  COREG TAKE ONE TABLET BY MOUTH TWICE DAILY   docusate sodium 100 MG capsule Commonly known as:  COLACE Take 1 capsule (100 mg total) by mouth 2 (two) times daily. To prevent constipation while taking pain medication.   feeding supplement (GLUCERNA SHAKE) Liqd Take 237 mLs by mouth 2 (two) times daily between meals.   INVOKANA 300 MG Tabs tablet Generic drug:  canagliflozin Take 1 tablet (300 mg total) by mouth daily.   MIRALAX powder Generic drug:  polyethylene glycol powder Take 17 g by mouth daily as needed.   omeprazole 20 MG capsule Commonly known as:  PRILOSEC Take 20 mg by mouth daily.   oxycodone 5 MG capsule Commonly known as:  OXY-IR Take 1 capsule (5 mg total) by mouth every 6 (six) hours as needed for pain. What changed:  how much to take   Rivaroxaban 15 MG Tabs tablet Commonly known as:  XARELTO Take 1 tablet (15 mg total) by mouth 2 (two) times daily with a meal.      Allergies  Allergen Reactions  . Lisinopril Anaphylaxis  . Quinine Other (See Comments)    Pt states heart attack-like symptoms  . Penicillins Swelling and Other (See Comments)    Patient states that she had swelling at the injection site after her last Penicillin shot but no swelling in the throat.  . Sulfonamide Derivatives Rash      The results of significant diagnostics from this hospitalization (including imaging, microbiology, ancillary and laboratory) are listed below for reference.    Significant Diagnostic Studies: Dg Thoracic Spine W/swimmers  Result Date: 08/01/2017 CLINICAL DATA:  Recent fall with back pain, initial encounter EXAM: THORACIC SPINE - 3 VIEWS COMPARISON:  06/01/2017 FINDINGS: Postsurgical changes and pacing device are again seen and stable. A lower thoracic compression deformity is noted at what appears to be T9. This is of uncertain chronicity but appears new from the prior exam.  No other compression deformities are noted. Mild osteophytic changes are seen. IMPRESSION: T9 compression deformity, new from the prior exam but of uncertain chronicity. MRI may be helpful for further evaluation as clinically indicated. Electronically Signed   By: Alcide Clever M.D.   On: 08/01/2017 12:43   Dg Lumbar Spine Complete  Result Date: 08/01/2017 CLINICAL DATA:  Fall 2 months ago with persistent low back pain, initial encounter EXAM: LUMBAR SPINE - COMPLETE 4+ VIEW COMPARISON:  08/04/2016 FINDINGS: Five lumbar type vertebral bodies are well visualized. No pars defects are seen. Vertebral body height is well maintained. Mild osteophytic changes are seen. Stable mild aneurysmal dilatation of the aorta is seen. No soft tissue abnormality is noted. IMPRESSION: Degenerative changes. Aortic atherosclerotic change with stable aneurysmal dilatation of the abdominal aorta. Electronically Signed   By: Alcide Clever M.D.   On:  08/01/2017 12:46   Ct Angio Chest Pe W/cm &/or Wo Cm  Result Date: 08/08/2017 CLINICAL DATA:  Three day history of worsening shortness of breath. EXAM: CT ANGIOGRAPHY CHEST WITH CONTRAST TECHNIQUE: Multidetector CT imaging of the chest was performed using the standard protocol during bolus administration of intravenous contrast. Multiplanar CT image reconstructions and MIPs were obtained to evaluate the vascular anatomy. CONTRAST:  100 cc Isovue 370 COMPARISON:  None. FINDINGS: Cardiovascular: Heart is enlarged. Patient is status post CABG Atherosclerotic calcification is noted in the wall of the thoracic aorta. Acute pulmonary embolus is identified in the right main pulmonary artery and lobar and more distal arteries to the right upper and lower lobes. Small clot burden is identified and pulmonary arteries to the left upper and lower lobes in may be nonacute. Mediastinum/Nodes: 14 mm short axis subcarinal lymph node is associated with a 12 mm short axis lymph node in the right hilum and  an 8 mm short axis left hilar lymph node. No axillary lymphadenopathy. Esophagus is unremarkable. Lungs/Pleura: Trace interlobular septal thickening is identified in the upper lobes. 4 mm subpleural nodule identified left upper lobe on image 26 series 6. No focal airspace consolidation. No overt pulmonary edema or pleural effusion. Compressive atelectasis noted posterior lower lobes bilaterally. Upper Abdomen: Unremarkable. Musculoskeletal: T9 compression fracture identified. Bones are diffusely osteopenic. Review of the MIP images confirms the above findings. IMPRESSION: 1. Acute pulmonary embolus identified in the right main pulmonary artery as well as lobar and more distal pulmonary arteries to the right upper and lower lobes. Although nonocclusive, the burden of thrombus in the right pulmonary artery is is relatively large. There is associated CT evidence of right heart strain (RV/LV Ratio = 1.1) consistent with at least submassive (intermediate risk) PE. The presence of right heart strain has been associated with an increased risk of morbidity and mortality. Please activate Code PE by paging [phone]. 2. There is trace string like embolus identified in multiple segmental and subsegmental branches to the left lung which may be acute, but given the string like appearance, chronic pulmonary embolus in the left lung would also be a consideration. 3. Mild mediastinal and right hilar lymphadenopathy, indeterminate. Follow-up CT chest with contrast in 3 months recommended to reassess. 4. Osteopenia with T9 compression fracture. Critical Value/emergent results were called by telephone at the time of interpretation on 08/08/2017 at 8:12 pm to Dr. Vanetta Mulders , who verbally acknowledged these results. Electronically Signed   By: Kennith Center M.D.   On: 08/08/2017 20:12   US Venous Img Lower Bilateral  Result Date: 08/09/2017 CLINICAL DATA:  Bilateral lower extremity edema. History of pulmonary embolism.  Evaluate for DVT. EXAM: BILATERAL LOWER EXTREMITY VENOUS DOPPLER ULTRASOUND TECHNIQUE: Gray-scale sonography with graded compression, as well as color Doppler and duplex ultrasound were performed to evaluate the lower extremity deep venous systems from the level of the common femoral vein and including the common femoral, femoral, profunda femoral, popliteal and calf veins including the posterior tibial, peroneal and gastrocnemius veins when visible. The superficial great saphenous vein was also interrogated. Spectral Doppler was utilized to evaluate flow at rest and with distal augmentation maneuvers in the common femoral, femoral and popliteal veins. COMPARISON:  None. FINDINGS: RIGHT LOWER EXTREMITY Common Femoral Vein: No evidence of thrombus. Normal compressibility, respiratory phasicity and response to augmentation. Saphenofemoral Junction: No evidence of thrombus. Normal compressibility and flow on color Doppler imaging. Profunda Femoral Vein: No evidence of thrombus. Normal compressibility and flow on color  Doppler imaging. Femoral Vein: There is hypoechoic occlusive thrombus throughout the imaged course of the right femoral vein. Popliteal Vein: There is hypoechoic occlusive slightly expansile thrombus throughout the right popliteal vein (representative images 30 - 32). Calf Veins: Suboptimally evaluated due to patient body habitus. Superficial Great Saphenous Vein: No evidence of thrombus. Normal compressibility and flow on color Doppler imaging. Other Findings: There is a minimal subcutaneous edema at the level of the right lower leg and ankle. LEFT LOWER EXTREMITY Common Femoral Vein: No evidence of thrombus. Normal compressibility, respiratory phasicity and response to augmentation. Saphenofemoral Junction: No evidence of thrombus. Normal compressibility and flow on color Doppler imaging. Profunda Femoral Vein: No evidence of thrombus. Normal compressibility and flow on color Doppler imaging. Femoral  Vein: There is hypoechoic occlusive thrombus throughout the imaged course of the left femoral vein. Popliteal Vein: No evidence of thrombus. Normal compressibility, respiratory phasicity and response to augmentation. Calf Veins: No evidence of thrombus. Normal compressibility and flow on color Doppler imaging. Superficial Great Saphenous Vein: No evidence of thrombus. Normal compressibility and flow on color Doppler imaging. Other Findings: A minimal subcutaneous edema is noted at the level the left calf. IMPRESSION: The examination is positive for occlusive bilateral lower extremity DVT involving the bilateral femoral and the right popliteal veins. Electronically Signed   By: Simonne Come M.D.   On: 08/09/2017 17:04   Dg Chest Portable 1 View  Result Date: 08/08/2017 CLINICAL DATA:  Shortness of breath. EXAM: PORTABLE CHEST 1 VIEW COMPARISON:  06/01/2017 FINDINGS: Dual lead pacer. Reverse apical lordotic positioning. Numerous leads and wires project over the chest. Midline trachea. Cardiomegaly accentuated by AP portable technique. Possible trace left pleural fluid. No pneumothorax. Low lung volumes. Pulmonary interstitial prominence. Mild left base airspace disease. IMPRESSION: Cardiomegaly and low lung volumes. Suspect mild pulmonary venous congestion. Possible trace left pleural fluid with adjacent atelectasis. Electronically Signed   By: Jeronimo Greaves M.D.   On: 08/08/2017 17:59   Dg Hand Complete Left  Result Date: 08/08/2017 CLINICAL DATA:  Left hand pain. EXAM: LEFT HAND - COMPLETE 3+ VIEW COMPARISON:  None. FINDINGS: There is no evidence of fracture or dislocation. Mild diffuse osteoarthritis is seen involving the digits the wrist. No erosions or bony lesions identified. Soft tissues are unremarkable. IMPRESSION: Mild osteoarthritis of the hand wrist. Electronically Signed   By: Irish Lack M.D.   On: 08/08/2017 18:09    Microbiology: Recent Results (from the past 240 hour(s))  Blood Culture  (routine x 2)     Status: None   Collection Time: 08/08/17  5:51 PM  Result Value Ref Range Status   Specimen Description RIGHT ANTECUBITAL  Final   Special Requests   Final    BOTTLES DRAWN AEROBIC AND ANAEROBIC Blood Culture adequate volume   Culture NO GROWTH 5 DAYS  Final   Report Status 08/13/2017 FINAL  Final  Blood Culture (routine x 2)     Status: None   Collection Time: 08/08/17  6:01 PM  Result Value Ref Range Status   Specimen Description BLOOD RIGHT ARM  Final   Special Requests   Final    BOTTLES DRAWN AEROBIC AND ANAEROBIC Blood Culture adequate volume   Culture NO GROWTH 5 DAYS  Final   Report Status 08/13/2017 FINAL  Final  MRSA PCR Screening     Status: None   Collection Time: 08/08/17 11:29 PM  Result Value Ref Range Status   MRSA by PCR NEGATIVE NEGATIVE Final    Comment:  The GeneXpert MRSA Assay (FDA approved for NASAL specimens only), is one component of a comprehensive MRSA colonization surveillance program. It is not intended to diagnose MRSA infection nor to guide or monitor treatment for MRSA infections.      Labs: Basic Metabolic Panel:  Recent Labs Lab 08/11/17 0537 08/12/17 0523 08/13/17 0602 08/15/17 0651 08/16/17 0831  NA 138 137 137 136 132*  K 3.3* 3.6 3.9 3.7 3.8  CL 105 104 104 101 96*  CO2 25 25 27 29 29   GLUCOSE 97 109* 129* 131* 134*  BUN 15 16 16 14 9   CREATININE 0.52 0.43* 0.43* 0.40* 0.41*  CALCIUM 8.7* 8.5* 8.5* 8.5* 8.5*  MG 1.7  --   --   --   --    Liver Function Tests: No results for input(s): AST, ALT, ALKPHOS, BILITOT, PROT, ALBUMIN in the last 168 hours. No results for input(s): LIPASE, AMYLASE in the last 168 hours. No results for input(s): AMMONIA in the last 168 hours. CBC:  Recent Labs Lab 08/10/17 0451  08/12/17 0523 08/13/17 0602 08/14/17 0848 08/15/17 0651 08/16/17 0831  WBC 12.0*  --   --   --  8.2 7.7 7.5  NEUTROABS 10.4*  --   --   --   --   --  5.7  HGB 10.2*  < > 9.8* 9.0* 9.7* 9.0*   9.0* 9.5*  HCT 34.3*  < > 33.2* 30.8* 33.0* 30.9*  30.7* 32.1*  MCV 80.5  --   --   --  79.9 79.9 78.7  PLT 177  --   --   --  318 377 446*  < > = values in this interval not displayed. Cardiac Enzymes: No results for input(s): CKTOTAL, CKMB, CKMBINDEX, TROPONINI in the last 168 hours. BNP: BNP (last 3 results) No results for input(s): BNP in the last 8760 hours.  ProBNP (last 3 results) No results for input(s): PROBNP in the last 8760 hours.  CBG:  Recent Labs Lab 08/15/17 1115 08/15/17 1627 08/15/17 2041 08/16/17 0810 08/16/17 1137  GLUCAP 177* 147* 149* 151* 171*       Signed:  Ayiden Milliman M  [facility] Pager: [phone] 08/16/2017, 12:33 PM

## 2017-08-16 NOTE — Care Management Note (Addendum)
Case Management Note  Patient Details  Name: [name] MRN: [id number] Date of Birth: Feb 13, 1932    Expected Discharge Date:  08/16/17               Expected Discharge Plan:  Skilled Nursing Facility  In-House Referral:  Clinical Social Work  Discharge planning Services  CM Consult  Status of Service:  Completed, signed off  If discussed at [employer] of Stay Meetings, dates discussed:  08/16/2017  Additional Comments: Pt discharging to SNF today. CSW to make placement arrangements. [employer] rep, aware of DC plan.  Malcolm Metro, RN 08/16/2017, 1:15 PM

## 2017-08-16 NOTE — Clinical Social Work Placement (Signed)
   CLINICAL SOCIAL WORK PLACEMENT  NOTE  Date:  08/16/2017  Patient Details  Name: [name] MRN: [id number] Date of Birth: 22-Jan-1932  Clinical Social Work is seeking post-discharge placement for this patient at the Skilled  Nursing Facility level of care (*CSW will initial, date and re-position this form in  chart as items are completed):  Yes   Patient/family provided with [facility] Clinical Social Work Department's list of facilities offering this level of care within the geographic area requested by the patient (or if unable, by the patient's family).  Yes   Patient/family informed of their freedom to choose among providers that offer the needed level of care, that participate in Medicare, Medicaid or managed care program needed by the patient, have an available bed and are willing to accept the patient.  Yes   Patient/family informed of [facility]'s ownership interest in [facility] and [facility], as well as of the fact that they are under no obligation to receive care at these facilities.  PASRR submitted to EDS on       PASRR number received on       Existing PASRR number confirmed on 08/16/17     FL2 transmitted to all facilities in geographic area requested by pt/family on 08/16/17     FL2 transmitted to all facilities within larger geographic area on       Patient informed that his/her managed care company has contracts with or will negotiate with certain facilities, including the following:        Yes   Patient/family informed of bed offers received.  Patient chooses bed at [facility]     Physician recommends and patient chooses bed at      Patient to be transferred to [facility] on 08/16/17.  Patient to be transferred to facility by [facility] staff     Patient family notified on 08/16/17 of transfer.  Name of family member notified:  daughter     PHYSICIAN       Additional Comment:     _______________________________________________ Annice Needy, LCSW 08/16/2017, 4:33 PM

## 2017-08-16 NOTE — NC FL2 (Signed)
[place] MEDICAID FL2 LEVEL OF CARE SCREENING TOOL     IDENTIFICATION  Patient Name: [name] Birthdate: 1932-09-03 Sex: female Admission Date (Current Location): 08/08/2017  [facility] and [place] Number:  [employer] and Address:  [facility],  [address] [place] [address]      Provider Number: [id number]  Attending Physician Name and Address:  [name], MD  Relative Name and Phone Number:       Current Level of Care: Hospital Recommended Level of Care: Skilled Nursing Facility Prior Approval Number:    Date Approved/Denied:   PASRR Number: [id number]  Discharge Plan: SNF    Current Diagnoses: Patient Active Problem List   Diagnosis Date Noted  . Pressure injury of skin 08/14/2017  . Pulmonary emboli (HCC) 08/08/2017  . Acute respiratory failure with hypoxia (HCC) 08/08/2017  . Postoperative anemia due to acute blood loss 06/04/2017  . Microcytic anemia 06/01/2017  . Closed right hip fracture, initial encounter (HCC) 06/01/2017  . Pacemaker 05/30/2015  . Complete heart block (HCC) 02/24/2015  . Encounter for therapeutic drug monitoring 02/04/2014  . History of MI (myocardial infarction) 07/17/2012  . Hyperlipidemia LDL goal < 70 07/17/2012  . S/P CABG x 4 04/14/2012  . S/P MVR (mitral valve repair) 04/13/2012  . Ejection fraction < 50% 04/13/2012  . Medication intolerance 04/13/2012  . Coronary artery disease 03/28/2012  . Diabetes (HCC) 04/24/2009  . GLAUCOMA 04/24/2009  . Essential hypertension 04/24/2009  . HEMORRHOIDS 04/24/2009  . DEGENERATIVE JOINT DISEASE 04/24/2009  . DIVERTICULITIS, HX OF 04/24/2009    Orientation RESPIRATION BLADDER Height & Weight     Self, Time, Situation, Place  O2 (5L) Continent Weight: 154 lb 8.7 oz (70.1 kg) Height:  5\' 8"  (172.7 cm)  BEHAVIORAL SYMPTOMS/MOOD NEUROLOGICAL BOWEL NUTRITION STATUS      Continent Diet (see discharge summary )  AMBULATORY STATUS COMMUNICATION OF  NEEDS Skin   Extensive Assist Verbally PU Stage and Appropriate Care (Mid Buttocks)                       Personal Care Assistance Level of Assistance  Bathing, Feeding, Dressing Bathing Assistance: Maximum assistance Feeding assistance: Independent Dressing Assistance: Maximum assistance     Functional Limitations Info  Sight, Hearing, Speech Sight Info: Adequate Hearing Info: Adequate Speech Info: Adequate    SPECIAL CARE FACTORS FREQUENCY  PT (By licensed PT)     PT Frequency: 5x/week               Contractures Contractures Info: Not present    Additional Factors Info  Code Status, Allergies, Psychotropic Code Status Info: Full Code Allergies Info: Lisinopril, Quinine, Penicillina, Sulfonamide Derivatives  Psychotropic Info: Prozac         Current Medications (08/16/2017):  This is the current hospital active medication list Current Facility-Administered Medications  Medication Dose Route Frequency Provider Last Rate Last Dose  . 0.9 %  sodium chloride infusion  250 mL Intravenous PRN Haydee Monica, MD 10 mL/hr at 08/15/17 1650 250 mL at 08/15/17 1650  . azithromycin (ZITHROMAX) tablet 500 mg  500 mg Oral Once [name], MD      . aztreonam (AZACTAM) 1 g in dextrose 5 % 50 mL IVPB  1 g Intravenous Q8H Vanetta Mulders, MD   Stopped at 08/16/17 0529  . docusate sodium (COLACE) capsule 100 mg  100 mg Oral BID Tarry Kos A, MD   100 mg at 08/16/17 9323  .  fentaNYL (SUBLIMAZE) injection 25 mcg  25 mcg Intravenous Q2H PRN Haydee Monica, MD   25 mcg at 08/15/17 2024  . FLUoxetine (PROZAC) capsule 20 mg  20 mg Oral Daily Tarry Kos A, MD   20 mg at 08/16/17 1610  . insulin aspart (novoLOG) injection 0-9 Units  0-9 Units Subcutaneous TID WC Haydee Monica, MD   2 Units at 08/16/17 1227  . methocarbamol (ROBAXIN) tablet 750 mg  750 mg Oral BID PRN [name], MD   750 mg at 08/15/17 2024  . ondansetron (ZOFRAN) tablet 4 mg  4 mg Oral Q6H PRN  Haydee Monica, MD       Or  . ondansetron [facility]) injection 4 mg  4 mg Intravenous Q6H PRN Tarry Kos A, MD      . oxyCODONE (Oxy IR/ROXICODONE) immediate release tablet 10 mg  10 mg Oral Q6H PRN Haydee Monica, MD   10 mg at 08/16/17 0459  . pantoprazole (PROTONIX) EC tablet 40 mg  40 mg Oral Daily Tarry Kos A, MD   40 mg at 08/16/17 9604  . polyethylene glycol (MIRALAX / GLYCOLAX) packet 17 g  17 g Oral Daily PRN [name], MD   17 g at 08/15/17 1556  . Rivaroxaban (XARELTO) tablet 15 mg  15 mg Oral BID WC [name], MD   15 mg at 08/16/17 5409  . sodium chloride flush (NS) 0.9 % injection 3 mL  3 mL Intravenous Q12H Tarry Kos A, MD   3 mL at 08/16/17 0928  . sodium chloride flush (NS) 0.9 % injection 3 mL  3 mL Intravenous PRN Haydee Monica, MD      . traMADol Janean Sark) tablet 100 mg  100 mg Oral Q6H PRN Haydee Monica, MD   100 mg at 08/14/17 2146  . vancomycin (VANCOCIN) IVPB 1000 mg/200 mL premix  1,000 mg Intravenous Q12H [name], MD   Stopped at 08/16/17 1027     Discharge Medications: Please see discharge summary for a list of discharge medications.  Relevant Imaging Results:  Relevant Lab Results:   Additional Information SS: [id number]  Warden Buffa D, LCSW

## 2017-08-16 NOTE — Clinical Social Work Note (Signed)
Clinical Social Work Assessment  Patient Details  Name: [name] MRN: [id number] Date of Birth: 16-Mar-1932  Date of referral:  08/16/17               Reason for consult:  Discharge Planning                Permission sought to share information with:    Permission granted to share information::     Name::        Agency::     Relationship::     Contact Information:  Daughter, Eber Jones.  Housing/Transportation Living arrangements for the past 2 months:  Single Family Home Source of Information:  Adult Children Patient Interpreter Needed:  None Criminal Activity/Legal Involvement Pertinent to Current Situation/Hospitalization:  No - Comment as needed Significant Relationships:  Adult Children, Spouse Lives with:  Spouse Do you feel safe going back to the place where you live?  Yes Need for family participation in patient care:  Yes (Comment)  Care giving concerns:  None identified by family at baseline.    Social Worker assessment / plan: Per daughter, Eber Jones, Patient lives alone, uses a walker currently, however at baseline before she broke her hip on 06/01/17 she was independent in ADLs and ambulation. Meryle Ready is interested in [facility].  Family has to pay $118.00 to [facility] before they can be admitted and family is agreeable. LCSW secured LOG for patient.    Employment status:  Retired Database administrator PT Recommendations:  Skilled Nursing Facility Information / Referral to community resources:  Skilled Nursing Facility  Patient/Family's Response to care: Patient and Family are agreeable to [facility].   Patient/Family's Understanding of and Emotional Response to Diagnosis, Current Treatment, and Prognosis:  Patient and Family understand patient's diagnosis, treatment and prognosis.   Emotional Assessment Appearance:  Appears stated age Attitude/Demeanor/Rapport:    Affect (typically observed):  Accepting, Calm Orientation:  Oriented to Self, Oriented to Place,  Oriented to  Time, Oriented to Situation Alcohol / Substance use:  Not Applicable Psych involvement (Current and /or in the community):  No (Comment)  Discharge Needs  Concerns to be addressed:  Discharge Planning Concerns Readmission within the last 30 days:  No Current discharge risk:  None Barriers to Discharge:  No Barriers Identified   Annice Needy, LCSW 08/16/2017, 4:45 PM

## 2017-08-17 ENCOUNTER — Encounter: Payer: Self-pay | Admitting: Internal Medicine

## 2017-08-17 ENCOUNTER — Non-Acute Institutional Stay (SKILLED_NURSING_FACILITY): Payer: Medicare HMO | Admitting: Internal Medicine

## 2017-08-17 DIAGNOSIS — I10 Essential (primary) hypertension: Secondary | ICD-10-CM

## 2017-08-17 DIAGNOSIS — R21 Rash and other nonspecific skin eruption: Secondary | ICD-10-CM

## 2017-08-17 DIAGNOSIS — I2510 Atherosclerotic heart disease of native coronary artery without angina pectoris: Secondary | ICD-10-CM | POA: Diagnosis not present

## 2017-08-17 DIAGNOSIS — I2699 Other pulmonary embolism without acute cor pulmonale: Secondary | ICD-10-CM | POA: Diagnosis not present

## 2017-08-17 DIAGNOSIS — Z8719 Personal history of other diseases of the digestive system: Secondary | ICD-10-CM | POA: Diagnosis not present

## 2017-08-17 DIAGNOSIS — E871 Hypo-osmolality and hyponatremia: Secondary | ICD-10-CM | POA: Diagnosis not present

## 2017-08-17 NOTE — Progress Notes (Signed)
Location:   [facility] Room Number: 124/P Place of Service:  SNF (31) Provider:  Velva Harman, MD  Patient Care Team: [name], MD as PCP - General (Internal Medicine) Wyline Mood, Dorothe Pea, MD as Consulting Physician (Cardiology) Marily Lente, NP as Nurse Practitioner (Cardiology)  Extended Emergency Contact Information Primary Emergency Contact: [place], [place] [address] [place] Home Phone: [phone] Relation: Daughter Secondary Emergency Contact: Trent,Patricia  [place] Home Phone: [phone] Relation: Daughter  Code Status:  Full Code Goals of care: Advanced Directive information Advanced Directives 08/09/2017  Does Patient Have a Medical Advance Directive? No  Type of Advance Directive -  Does patient want to make changes to medical advance directive? -  Would patient like information on creating a medical advance directive? No - Patient declined  Pre-existing out of facility DNR order (yellow form or pink MOST form) -     Chief Complaint  Patient presents with  . Hospitalization Follow-up    Hospitalization F/U  Acute visit status post hospitalization for pulmonary embolism  HPI:  Pt is a 83 y.o. female seen today for a hospital f/u s/p admission to hospital for pulmonary embolism and setting of bilateral lower extremity DVTs.  Patient is status post CABG 3 and mitral valve repair as well as complete heart block with pacer inserted-hypertension with a right hip repair in June 2018.  Also previous history of GI bleed requiring transfusion-.  She had been on aspirin for anticoagulation secondary to history of GI bleed this was evaluated by orthopedics.  Apparently she came in the hospital and diagnosed with bilateral DVT of the femoral-popliteal systems with an acute pulmonary embolism.  Embolism was in the right remains damp bronchus and  submassive.  Initially she was put on IV upper and seen by cardiology and under careful consideration of the anticoagulation risk versus benefit-it was decided to start her on Xarelto 50 mg by mouth twice a day for a week and then decrease to 20 mg per day.  This was chosen because there is a reversible antidotal.  She tolerated the Xarelto well and has been discharged to skilled nursing to begin rehabilitation.  Recommendation is to check hemoglobin every 2-3 times a week.  Hemoglobin was 9.5 yesterday.  She is also on Zithromax for short course it appears this is for more prophylaxis for respiratory issues.  In regards to other issues patient does have some fairly significant rash in the buttocks sacral area apparently she is incontinent.  In regards to coronary artery disease status post CABG with pacemaker this appears relatively stable-she is on Coreg.  She does have a history of hypertension this appears stable on her Norvasc as well as Coreg.  In regards to diabetes she is his Invokana CBG this morning was 143 will need to be monitored during.  Her family is at bedside and states that she was quite ambulatory before her recent hospitalization and live by herself with assistance from family who live nearby-she appears to have significant weakness here status post hospitalization she will need aggressive PT and OT  Currently she has no complaints other than weakness again does have fairly significant buttocks rash which will need to be addressed  Past Medical History:  Diagnosis Date  . CAD (coronary artery disease)    a. s/p PCI to LAD '08, Severe 3V CAD by cath 03/27/12 --> CABG  . CHF (  congestive heart failure) (HCC)   . Complete heart block (HCC)    a. s/p STJ dual chamber pacemaker  . Depression   . Hypertension   . Hypothyroidism   . Mitral regurgitation    moderate by cath, mild by echo 03/2012  . Osteoarthrosis, unspecified whether generalized or localized, unspecified  site   . Paroxysmal atrial fibrillation (HCC)    a. identified s/p CABG b. OAC discontinued 02/2014 after no recurrence  . Personal history of other diseases of digestive system   . Postoperative anemia due to acute blood loss 06/04/2017  . Type II or unspecified type diabetes mellitus without mention of complication, not stated as uncontrolled   . Unspecified glaucoma(365.9)   . Unspecified hemorrhoids without mention of complication    Past Surgical History:  Procedure Laterality Date  . CHOLECYSTECTOMY  12/2006  . CORONARY ARTERY BYPASS GRAFT  04/03/2012   Procedure: CORONARY ARTERY BYPASS GRAFTING (CABG);  Surgeon: Kerin Perna, MD;  Location: [facility] OR;  Service: Open Heart Surgery;  Laterality: N/A;  CABG x four;  using left internal mammary artery and bilateral greater saphenous veins  . ERCP  2008  . INTRAMEDULLARY (IM) NAIL INTERTROCHANTERIC Right 06/02/2017   Procedure: INTRAMEDULLARY (IM) NAIL INTERTROCHANTRIC RIGHT HIP;  Surgeon: Sheral Apley, MD;  Location: [facility] OR;  Service: Orthopedics;  Laterality: Right;  . LEFT HEART CATHETERIZATION WITH CORONARY ANGIOGRAM N/A 03/27/2012   Procedure: LEFT HEART CATHETERIZATION WITH CORONARY ANGIOGRAM;  Surgeon: Herby Abraham, MD;  Location: [facility] CATH LAB;  Service: Cardiovascular;  Laterality: N/A;  . MITRAL VALVE REPAIR  04/03/2012   Procedure: MITRAL VALVE REPAIR (MVR);  Surgeon: Kerin Perna, MD;  Location: [facility] OR;  Service: Open Heart Surgery;  Laterality: N/A;  . PERMANENT PACEMAKER INSERTION N/A 02/26/2015   STJ Assurity dual chamber pacemaker implanted by Dr Johney Frame for symptomatic complete heart block    Allergies  Allergen Reactions  . Lisinopril Anaphylaxis  . Quinine Other (See Comments)    Pt states heart attack-like symptoms  . Penicillins Swelling and Other (See Comments)    Patient states that she had swelling at the injection site after her last Penicillin shot but no swelling in the throat.  . Sulfonamide Derivatives Rash     Outpatient Encounter Prescriptions as of 08/17/2017  Medication Sig  . amLODipine (NORVASC) 5 MG tablet Take 5 mg by mouth daily.  [name] azithromycin (ZITHROMAX) 250 MG tablet 250 Mg. P.O. Daily for 4 additional days then discontinue  . carvedilol (COREG) 6.25 MG tablet TAKE ONE TABLET BY MOUTH TWICE DAILY  . docusate sodium (COLACE) 100 MG capsule Take 1 capsule (100 mg total) by mouth 2 (two) times daily. To prevent constipation while taking pain medication.  . feeding supplement, GLUCERNA SHAKE, (GLUCERNA SHAKE) LIQD Take 237 mLs by mouth 2 (two) times daily between meals.  . INVOKANA 300 MG TABS tablet Take 1 tablet (300 mg total) by mouth daily.  [name] omeprazole (PRILOSEC) 20 MG capsule Take 20 mg by mouth daily.   [name] oxycodone (OXY-IR) 5 MG capsule Take 1 capsule (5 mg total) by mouth every 6 (six) hours as needed for pain.  . polyethylene glycol powder (MIRALAX) powder Take 17 g by mouth daily as needed.  . Rivaroxaban (XARELTO) 15 MG TABS tablet Take 15 mg by mouth 2 (two) times daily with a meal. Until 08/19/17.  . rivaroxaban (XARELTO) 20 MG TABS tablet Take 20 mg by mouth daily with supper. Starting on 08/20/2017  . [  DISCONTINUED] Rivaroxaban (XARELTO) 15 MG TABS tablet Take 1 tablet (15 mg total) by mouth 2 (two) times daily with a meal. (Patient taking differently: Take 15 mg by mouth 2 (two) times daily with a meal. Until 08/19/2017 then take 20 mg by mouth once a day.)   No facility-administered encounter medications on file as of 08/17/2017.      Review of Systems Provided by patient as well as family at bedside    In general is not complaining of any fever or chills.   Skin does have a buttocks perineal rash does not complain of itching does have some discomfort.  Head ears eyes nose mouth and throat does not complain of visual changes or sore throat does have hearing difficulty.  Respiratory is not complaining of shortness of breath at this time she is on oxygen does not  cough.  Cardiac does not plain of chest pain has lower extremity edema according family this is relatively baseline.  GI is not complaining of nausea vomiting diarrhea constipation or abdominal discomfort apparently she lunch fairly well this noon-apparently was taking mainly liquids in the hospital.  GU does not complain of dysuria does have a perineal rash.  Muscle skeletal has significant weakness is not really complaining of joint pain has debility  Neurologic is not complaining of dizziness headache or syncopal-type feelings.  Psych does not complain of overt anxiety or depression  Immunization History  Administered Date(s) Administered  . Influenza-Unspecified 09/26/2014  . Pneumococcal-Unspecified 09/26/2014   Pertinent  Health Maintenance Due  Topic Date Due  . FOOT EXAM  09/17/2017 (Originally 01/08/1942)  . OPHTHALMOLOGY EXAM  09/17/2017 (Originally 01/08/1942)  . URINE MICROALBUMIN  09/17/2017 (Originally 01/08/1942)  . DEXA SCAN  09/17/2017 (Originally 01/07/1999)  . INFLUENZA VACCINE  11/26/2017 (Originally 07/27/2017)  . PNA vac Low Risk Adult (2 of 2 - PCV13) 11/26/2017 (Originally 09/27/2015)  . HEMOGLOBIN A1C  12/01/2017   No flowsheet data found. Functional Status Survey:    Vitals:   08/17/17 1132  BP: (!) 110/56  Pulse: 64  Resp: 18  Temp: 98.4 F (36.9 C)  TempSrc: Oral  SpO2: 94%  Currently on oxygen supplementation  Physical Exam In general this is a frail appearing elderly female in no distress lying in bed comfortably but appears quite weak.  Her skin is warm and dry with somewhat of a macerated erythematous rash bilateral lower buttocks extending into the perineal area I do not see any drainage or evidence of cellulitis at this point possibly some satellite lesions in the perineal area.  I do not see any vaginal drainage.  Eyes pupils appear reactive to light she does have an enlarged left pupil which is baseline per family visual acuity appears  grossly intact.  Oropharynx is clear mucous membranes moist.  Chest is clear to auscultation with quite shallow air entry there is no labored breathing.  She does have oxygen.  Heart is regular rate and rhythm with occasional irregular beat she does have a pacemaker.  She has always say one plus lower extremity edema possible venous stasis changes and some edema most noticeable of her knees bilaterally.  Abdomen is somewhat obese soft nontender positive bowel sounds the tenderness appears to be more to the invasive maneuver and not acute tenderness.  GU again does have the perineal rash I  do not see any drainage.  Muscle skeletal has significant weakness especially lower extremities is able to move all extremities 4 but has significant lower extremity weakness is  able to move her legs against gravity.  Neurologic is grossly intact her speech is clear has lower extremity weakness I do not see any lateralizing findings.  Psych she appears alert and oriented does not speak much appears weak but is pleasant and appropriate Labs reviewed:  Recent Labs  06/03/17 0753  08/11/17 0537  08/13/17 0602 08/15/17 0651 08/16/17 0831  NA 137  < > 138  < > 137 136 132*  K 3.7  < > 3.3*  < > 3.9 3.7 3.8  CL 106  < > 105  < > 104 101 96*  CO2 19*  < > 25  < > 27 29 29   GLUCOSE 122*  < > 97  < > 129* 131* 134*  BUN 14  < > 15  < > 16 14 9   CREATININE 0.89  < > 0.52  < > 0.43* 0.40* 0.41*  CALCIUM 8.3*  < > 8.7*  < > 8.5* 8.5* 8.5*  MG 1.8  --  1.7  --   --   --   --   < > = values in this interval not displayed.  Recent Labs  08/08/17 1750  AST 16  ALT 6*  ALKPHOS 70  BILITOT 0.7  PROT 7.0  ALBUMIN 3.4*    Recent Labs  08/08/17 1750  08/10/17 0451  08/14/17 0848 08/15/17 0651 08/16/17 0831  WBC 7.8  < > 12.0*  --  8.2 7.7 7.5  NEUTROABS 5.8  --  10.4*  --   --   --  5.7  HGB 10.6*  < > 10.2*  < > 9.7* 9.0*  9.0* 9.5*  HCT 35.8*  < > 34.3*  < > 33.0* 30.9*  30.7* 32.1*   MCV 80.4  < > 80.5  --  79.9 79.9 78.7  PLT 133*  < > 177  --  318 377 446*  < > = values in this interval not displayed. Lab Results  Component Value Date   TSH 4.550 (H) 02/24/2015   Lab Results  Component Value Date   HGBA1C 8.0 (H) 06/01/2017   Lab Results  Component Value Date   CHOL 149 03/29/2012   HDL 36 (L) 03/29/2012   LDLCALC 89 03/29/2012   TRIG 121 03/29/2012   CHOLHDL 4.1 03/29/2012    Significant Diagnostic Results in last 30 days:  No results found.  Assessment/Plan  #1 history of pulmonary embolism and bilateral DVTs-she is on Xarelto 50 mg twice a day for 1 week and then will be decreased to 20 mg a day-this is complicated with previous history of GI bleed-recommendation to check his CBC 2-3 times a week we will recheck this tomorrow discharge hemoglobin appear to be stable at 9.5-clinically continues on oxygen at some point will try to wean her off this-.  She is finishing a course of Zithromax I suspect this is empirical treatment for concerns of respiratory issues.  #2-history coronary artery disease at this point appears to be asymptomatic she is on Coreg as well as anticoagulation as noted above at this point will monitor she does have a pacemaker.  #3 history of hypertension she is on Norvasc and Coreg this appears stable currently blood pressures 110/56.  #4 history of diabetes type 2 is on Invokana--at this point appears stable CBG this morning was 143 Will wait more readings before making any changes.  #5 pain management she continues on oxycodone every 6 hours as needed for pain.  #6-general debility this is  quite significant she will need aggressive PT and OT she does have oxycodone as needed for pain which I suspect she will experience with therapy becomes more aggressive.  #7 history of buttock perineal rash-will start Diflucan 100 mg daily for 3 days-also will have wound care follow up on this.--Consider nystatin cream as well  #8- history of  complete heart block she is status post pacemaker  #9 history of mild hyponatremia note sodium was 132 on lab done yesterday we will update this tomorrow  #10 history of grade 1 diastolic CHF-she is not on a diuretic currently-at this point will monitor clinically  Again she will need an updated CBC will obtain a metabolic panel as well tomorrow also she will need management of her rash and extensive PT and OT.  [id number] note greater than 45 minutes spent assessing patient-reviewing her chart reviewing her labs discussing her status with her daughters at bedside-and  coordinating and formulating a plan of care-of note greater than 50% of time spent coordinating plan of care

## 2017-08-18 ENCOUNTER — Non-Acute Institutional Stay (SKILLED_NURSING_FACILITY): Payer: Medicare HMO | Admitting: Internal Medicine

## 2017-08-18 ENCOUNTER — Encounter (HOSPITAL_COMMUNITY)
Admission: RE | Admit: 2017-08-18 | Discharge: 2017-08-18 | Disposition: A | Discharge status: Home / Self Care | Payer: Medicare HMO | Source: Skilled Nursing Facility | Attending: Internal Medicine | Admitting: Internal Medicine

## 2017-08-18 ENCOUNTER — Encounter: Payer: Self-pay | Admitting: Internal Medicine

## 2017-08-18 DIAGNOSIS — I2510 Atherosclerotic heart disease of native coronary artery without angina pectoris: Secondary | ICD-10-CM | POA: Insufficient documentation

## 2017-08-18 DIAGNOSIS — I2699 Other pulmonary embolism without acute cor pulmonale: Secondary | ICD-10-CM | POA: Diagnosis not present

## 2017-08-18 DIAGNOSIS — I10 Essential (primary) hypertension: Secondary | ICD-10-CM | POA: Diagnosis not present

## 2017-08-18 DIAGNOSIS — I82413 Acute embolism and thrombosis of femoral vein, bilateral: Secondary | ICD-10-CM | POA: Insufficient documentation

## 2017-08-18 DIAGNOSIS — E0800 Diabetes mellitus due to underlying condition with hyperosmolarity without nonketotic hyperglycemic-hyperosmolar coma (NKHHC): Secondary | ICD-10-CM | POA: Diagnosis not present

## 2017-08-18 DIAGNOSIS — D649 Anemia, unspecified: Secondary | ICD-10-CM

## 2017-08-18 LAB — BASIC METABOLIC PANEL
ANION GAP: 9 (ref 5–15)
BUN: 11 mg/dL (ref 6–20)
CHLORIDE: 96 mmol/L — AB (ref 101–111)
CO2: 29 mmol/L (ref 22–32)
Calcium: 8.6 mg/dL — ABNORMAL LOW (ref 8.9–10.3)
Creatinine, Ser: 0.45 mg/dL (ref 0.44–1.00)
GFR calc non Af Amer: >60 mL/min (ref >60)
Glucose, Bld: 121 mg/dL — ABNORMAL HIGH (ref 65–99)
POTASSIUM: 3.3 mmol/L — AB (ref 3.5–5.1)
Sodium: 134 mmol/L — ABNORMAL LOW (ref 135–145)

## 2017-08-18 LAB — CBC WITH DIFFERENTIAL/PLATELET
BASOS ABS: 0 10*3/uL (ref 0.0–0.1)
BASOS PCT: 0 %
Eosinophils Absolute: 0.1 10*3/uL (ref 0.0–0.7)
Eosinophils Relative: 2 %
HEMATOCRIT: 31.3 % — AB (ref 36.0–46.0)
HEMOGLOBIN: 9.4 g/dL — AB (ref 12.0–15.0)
Lymphocytes Relative: 15 %
Lymphs Abs: 1.1 10*3/uL (ref 0.7–4.0)
MCH: 23.6 pg — ABNORMAL LOW (ref 26.0–34.0)
MCHC: 30 g/dL (ref 30.0–36.0)
MCV: 78.4 fL (ref 78.0–100.0)
MONOS PCT: 11 %
Monocytes Absolute: 0.8 10*3/uL (ref 0.1–1.0)
Neutro Abs: 4.9 10*3/uL (ref 1.7–7.7)
Neutrophils Relative %: 72 %
Platelets: 462 10*3/uL — ABNORMAL HIGH (ref 150–400)
RBC: 3.99 MIL/uL (ref 3.87–5.11)
RDW: 17.2 % — ABNORMAL HIGH (ref 11.5–15.5)
WBC: 6.9 10*3/uL (ref 4.0–10.5)

## 2017-08-18 NOTE — Progress Notes (Addendum)
Provider: Einar Crow  Location:   [facility] Room Number: 124/P Place of Service:  SNF (31)  PCP: [name], MD Patient Care Team: [name], MD as PCP - General (Internal Medicine) Wyline Mood, Dorothe Pea, MD as Consulting Physician (Cardiology) Marily Lente, NP as Nurse Practitioner (Cardiology)  Extended Emergency Contact Information Primary Emergency Contact: [place], [place] [address] Darden Amber of [place] Home Phone: [phone] Relation: Daughter Secondary Emergency Contact: Mcarthur Rossetti [place] of [place] Home Phone: [phone] Relation: Daughter  Code Status: Full Code Goals of Care: Advanced Directive information Advanced Directives 08/18/2017  Does Patient Have a Medical Advance Directive? Yes  Type of Advance Directive (No Data)  Does patient want to make changes to medical advance directive? No - Patient declined  Would patient like information on creating a medical advance directive? No - Patient declined  Pre-existing out of facility DNR order (yellow form or pink MOST form) -      Chief Complaint  Patient presents with  . New Admit To SNF    Admission Visit    HPI: Patient is a 83 y.o. female seen today for admission to [facility] for therapy. Patient has h/o CAD S/P CABG in 2013, MR , S/P Pacemaker for Complete heart Block, Chronic Systolic failure, S/P Hip surgery in 06/18  Patient has been doing well till 06/18 when she fell and has right hip surgery requiring ORIF. She was sent home on Aspirin and she stayed in rehab for 4 weeks. Per daughter she was walking with the walker.but then has Back pain and was diagnosed with T 9 compression fracture. So she has been less mobile recently.  She developed swelling in her legs and then SOB . In the ED was found to Have submassive PE of the right main PA as well as lobar and more distal pulmonary arteries. There also appeared to be more chronic  pulmonary emboli in the left lung. She has been now on Xarelto.  She has h/o ? GI bleed in the past with anemia and she has been on Coumadin before which was stopped due to that. Patient says she had EGD and Colonoscopy in 2008 Patient continues to have Nausea and some right sided abdominal pain. Has decreased appetite. Continuous SOB. No cough or PND. Or Chest pain. She lives alone. Walks with the walker. Takes care of her finances. Has Family who checks on her evryday. Lost there husband in Jan of this Month.    Past Medical History:  Diagnosis Date  . CAD (coronary artery disease)    a. s/p PCI to LAD '08, Severe 3V CAD by cath 03/27/12 --> CABG  . CHF (congestive heart failure) (HCC)   . Complete heart block (HCC)    a. s/p STJ dual chamber pacemaker  . Depression   . Hypertension   . Hypothyroidism   . Mitral regurgitation    moderate by cath, mild by echo 03/2012  . Osteoarthrosis, unspecified whether generalized or localized, unspecified site   . Paroxysmal atrial fibrillation (HCC)    a. identified s/p CABG b. OAC discontinued 02/2014 after no recurrence  . Personal history of other diseases of digestive system   . Postoperative anemia due to acute blood loss 06/04/2017  . Type II or unspecified type diabetes mellitus without mention of complication, not stated as uncontrolled   . Unspecified glaucoma(365.9)   . Unspecified hemorrhoids without mention of complication    Past  Surgical History:  Procedure Laterality Date  . CHOLECYSTECTOMY  12/2006  . CORONARY ARTERY BYPASS GRAFT  04/03/2012   Procedure: CORONARY ARTERY BYPASS GRAFTING (CABG);  Surgeon: Kerin Perna, MD;  Location: [facility] OR;  Service: Open Heart Surgery;  Laterality: N/A;  CABG x four;  using left internal mammary artery and bilateral greater saphenous veins  . ERCP  2008  . INTRAMEDULLARY (IM) NAIL INTERTROCHANTERIC Right 06/02/2017   Procedure: INTRAMEDULLARY (IM) NAIL INTERTROCHANTRIC RIGHT HIP;  Surgeon: Sheral Apley, MD;  Location: [facility] OR;  Service: Orthopedics;  Laterality: Right;  . LEFT HEART CATHETERIZATION WITH CORONARY ANGIOGRAM N/A 03/27/2012   Procedure: LEFT HEART CATHETERIZATION WITH CORONARY ANGIOGRAM;  Surgeon: Herby Abraham, MD;  Location: [facility] CATH LAB;  Service: Cardiovascular;  Laterality: N/A;  . MITRAL VALVE REPAIR  04/03/2012   Procedure: MITRAL VALVE REPAIR (MVR);  Surgeon: Kerin Perna, MD;  Location: [facility] OR;  Service: Open Heart Surgery;  Laterality: N/A;  . PERMANENT PACEMAKER INSERTION N/A 02/26/2015   STJ Assurity dual chamber pacemaker implanted by Dr Johney Frame for symptomatic complete heart block    reports that she quit smoking about 12 years ago. Her smoking use included Cigarettes. She has never used smokeless tobacco. She reports that she does not drink alcohol or use drugs. Social History   Social History  . Marital status: Widowed    Spouse name: N/A  . Number of children: N/A  . Years of education: N/A   Occupational History  . Not on file.   Social History Main Topics  . Smoking status: Former Smoker    Types: Cigarettes    Quit date: 09/03/2004  . Smokeless tobacco: Never Used  . Alcohol use No  . Drug use: No  . Sexual activity: Not on file   Other Topics Concern  . Not on file   Social History Narrative  . No narrative on file    Functional Status Survey:    Family History  Problem Relation Age of Onset  . Heart disease Mother   . Heart disease Father 2       CABG  . Arrhythmia Brother        slow HR off and on since his 91s    Health Maintenance  Topic Date Due  . FOOT EXAM  09/17/2017 (Originally 01/08/1942)  . OPHTHALMOLOGY EXAM  09/17/2017 (Originally 01/08/1942)  . URINE MICROALBUMIN  09/17/2017 (Originally 01/08/1942)  . DEXA SCAN  09/17/2017 (Originally 01/07/1999)  . INFLUENZA VACCINE  11/26/2017 (Originally 07/27/2017)  . TETANUS/TDAP  11/26/2017 (Originally 01/07/1951)  . PNA vac Low Risk Adult (2 of 2 - PCV13) 11/26/2017  (Originally 09/27/2015)  . HEMOGLOBIN A1C  12/01/2017    Allergies  Allergen Reactions  . Lisinopril Anaphylaxis  . Quinine Other (See Comments)    Pt states heart attack-like symptoms  . Penicillins Swelling and Other (See Comments)    Patient states that she had swelling at the injection site after her last Penicillin shot but no swelling in the throat.  . Sulfonamide Derivatives Rash    Outpatient Encounter Prescriptions as of 08/18/2017  Medication Sig  . amLODipine (NORVASC) 5 MG tablet Take 5 mg by mouth daily.  [name] azithromycin (ZITHROMAX) 250 MG tablet 250 Mg. P.O. Daily for 4 additional days then discontinue  . carvedilol (COREG) 6.25 MG tablet TAKE ONE TABLET BY MOUTH TWICE DAILY  . docusate sodium (COLACE) 100 MG capsule Take 1 capsule (100 mg total) by mouth 2 (two) times daily.  To prevent constipation while taking pain medication.  . feeding supplement, GLUCERNA SHAKE, (GLUCERNA SHAKE) LIQD Take 237 mLs by mouth 2 (two) times daily between meals.  . fluconazole (DIFLUCAN) 100 MG tablet Take 100 mg by mouth daily.  . INVOKANA 300 MG TABS tablet Take 1 tablet (300 mg total) by mouth daily.  [name] ketoconazole (NIZORAL) 2 % cream Apply 1 application topically 2 (two) times daily. Apply to perineal and sacral fungal rash  . omeprazole (PRILOSEC) 20 MG capsule Take 20 mg by mouth daily.   [name] oxycodone (OXY-IR) 5 MG capsule Take 1 capsule (5 mg total) by mouth every 6 (six) hours as needed for pain.  . polyethylene glycol powder (MIRALAX) powder Take 17 g by mouth daily as needed.  . Rivaroxaban (XARELTO) 15 MG TABS tablet Take 15 mg by mouth 2 (two) times daily with a meal. Until 08/19/17.  . rivaroxaban (XARELTO) 20 MG TABS tablet Take 20 mg by mouth daily with supper. Starting on 08/20/2017   No facility-administered encounter medications on file as of 08/18/2017.      Review of Systems  Constitutional: Positive for activity change, appetite change and fatigue. Negative for fever and  unexpected weight change.  HENT: Negative.   Respiratory: Positive for shortness of breath. Negative for cough, chest tightness and wheezing.   Cardiovascular: Positive for leg swelling. Negative for chest pain.  Gastrointestinal: Positive for abdominal pain and nausea. Negative for abdominal distention, constipation and diarrhea.  Genitourinary: Negative.   Musculoskeletal: Negative.   Skin: Negative.   Neurological: Positive for weakness. Negative for dizziness and light-headedness.  Psychiatric/Behavioral: Negative.     Vitals:   08/18/17 0922  BP: (!) 146/66  Pulse: 69  Resp: 20  Temp: 97.9 F (36.6 C)  TempSrc: Oral  SpO2: 91%   There is no height or weight on file to calculate BMI. Physical Exam  Constitutional: She is oriented to person, place, and time. She appears well-developed and well-nourished.  HENT:  Head: Normocephalic.  Mouth/Throat: Oropharynx is clear and moist.  Eyes: Pupils are equal, round, and reactive to light.  Neck: Neck supple.  Cardiovascular: Normal rate and normal heart sounds.   Pulmonary/Chest: Effort normal and breath sounds normal. No respiratory distress. She has no wheezes. She has no rales.  Abdominal: Soft. Bowel sounds are normal. She exhibits no distension. There is no tenderness. There is no rebound.  Musculoskeletal: She exhibits edema.  Neurological: She is alert and oriented to person, place, and time.  No focal deficit  Skin: Skin is warm and dry.  Psychiatric: She has a normal mood and affect. Thought content normal.    Labs reviewed: Basic Metabolic Panel:  Recent Labs  40/98/11 0753  08/11/17 0537  08/13/17 0602 08/15/17 0651 08/16/17 0831  NA 137  < > 138  < > 137 136 132*  K 3.7  < > 3.3*  < > 3.9 3.7 3.8  CL 106  < > 105  < > 104 101 96*  CO2 19*  < > 25  < > 27 29 29   GLUCOSE 122*  < > 97  < > 129* 131* 134*  BUN 14  < > 15  < > 16 14 9   CREATININE 0.89  < > 0.52  < > 0.43* 0.40* 0.41*  CALCIUM 8.3*  < > 8.7*   < > 8.5* 8.5* 8.5*  MG 1.8  --  1.7  --   --   --   --   < > =  values in this interval not displayed. Liver Function Tests:  Recent Labs  08/08/17 1750  AST 16  ALT 6*  ALKPHOS 70  BILITOT 0.7  PROT 7.0  ALBUMIN 3.4*   No results for input(s): LIPASE, AMYLASE in the last 8760 hours. No results for input(s): AMMONIA in the last 8760 hours. CBC:  Recent Labs  08/10/17 0451  08/15/17 0651 08/16/17 0831 08/18/17 0615  WBC 12.0*  < > 7.7 7.5 6.9  NEUTROABS 10.4*  --   --  5.7 4.9  HGB 10.2*  < > 9.0*  9.0* 9.5* 9.4*  HCT 34.3*  < > 30.9*  30.7* 32.1* 31.3*  MCV 80.5  < > 79.9 78.7 78.4  PLT 177  < > 377 446* 462*  < > = values in this interval not displayed. Cardiac Enzymes:  Recent Labs  08/08/17 1750  TROPONINI <0.03   BNP: Invalid input(s): POCBNP Lab Results  Component Value Date   HGBA1C 8.0 (H) 06/01/2017   Lab Results  Component Value Date   TSH 4.550 (H) 02/24/2015   Lab Results  Component Value Date   VITAMINB12 406 03/31/2012   Lab Results  Component Value Date   FOLATE 17.1 03/31/2012   Lab Results  Component Value Date   IRON 16 (L) 03/31/2012   TIBC 294 03/31/2012   FERRITIN 48 08/09/2017    Imaging and Procedures obtained prior to SNF admission: No results found.  Assessment/Plan  Pulmonary Embolism Patient is doing well. Continues to be SOB but stable on Oxygen. Continue Xarelto. Per cardiology patient will need that for long time due to Small Clots in her lung which looked Chronic.  Anemia with GI symptoms Patient has been c/o ?RUQ pain with Nausea. Will change her Omeprazole to Protonix. Also Check hepatic Panel and Amylase Also Cardiology had asked for GI to follow her as she has ? H/O GI Bleed in the past on coumadin and there was some concern of recurrence on Xarelto. Will also get Iron studies  And Vit B12 Her Ferritin is low in hospital GI consult with Dr Karilyn Cota.   Essential hypertension Continue on Norvasc and  carvedilol  Diabetes mellitus  Start her on Accu check QD On Invokana Her A1C was 8 in 06/18  S/P Pacemaker Follows with cardiology.  Hypokalemia Supplement potassium.     Family/ staff Communication:   Labs/tests ordered: Hepatic panel, CBC, Amylase, iron studies,TSH,   Total time spent in this patient care encounter was 45_ minutes; greater than 50% of the visit spent counseling patient, reviewing records , Labs and coordinating care for problems addressed at this encounter.

## 2017-08-19 ENCOUNTER — Encounter (HOSPITAL_COMMUNITY)
Admission: RE | Admit: 2017-08-19 | Discharge: 2017-08-19 | Disposition: A | Discharge status: Home / Self Care | Payer: Medicare HMO | Source: Skilled Nursing Facility | Attending: Internal Medicine | Admitting: Internal Medicine

## 2017-08-19 DIAGNOSIS — I82413 Acute embolism and thrombosis of femoral vein, bilateral: Secondary | ICD-10-CM | POA: Insufficient documentation

## 2017-08-19 DIAGNOSIS — I2699 Other pulmonary embolism without acute cor pulmonale: Secondary | ICD-10-CM | POA: Insufficient documentation

## 2017-08-19 DIAGNOSIS — I10 Essential (primary) hypertension: Secondary | ICD-10-CM | POA: Insufficient documentation

## 2017-08-19 DIAGNOSIS — I2510 Atherosclerotic heart disease of native coronary artery without angina pectoris: Secondary | ICD-10-CM | POA: Insufficient documentation

## 2017-08-19 LAB — HEPATIC FUNCTION PANEL
ALK PHOS: 60 U/L (ref 38–126)
ALT: 12 U/L — ABNORMAL LOW (ref 14–54)
AST: 14 U/L — ABNORMAL LOW (ref 15–41)
Albumin: 2.2 g/dL — ABNORMAL LOW (ref 3.5–5.0)
BILIRUBIN INDIRECT: 0.3 mg/dL (ref 0.3–0.9)
BILIRUBIN TOTAL: 0.4 mg/dL (ref 0.3–1.2)
Bilirubin, Direct: 0.1 mg/dL (ref 0.1–0.5)
Total Protein: 5.5 g/dL — ABNORMAL LOW (ref 6.5–8.1)

## 2017-08-19 LAB — BASIC METABOLIC PANEL
Anion gap: 6 (ref 5–15)
BUN: 11 mg/dL (ref 6–20)
CALCIUM: 8.4 mg/dL — AB (ref 8.9–10.3)
CO2: 32 mmol/L (ref 22–32)
CREATININE: 0.45 mg/dL (ref 0.44–1.00)
Chloride: 99 mmol/L — ABNORMAL LOW (ref 101–111)
GFR calc Af Amer: >60 mL/min (ref >60)
GFR calc non Af Amer: >60 mL/min (ref >60)
Glucose, Bld: 118 mg/dL — ABNORMAL HIGH (ref 65–99)
Potassium: 3.2 mmol/L — ABNORMAL LOW (ref 3.5–5.1)
Sodium: 137 mmol/L (ref 135–145)

## 2017-08-19 LAB — IRON AND TIBC
Iron: 16 ug/dL — ABNORMAL LOW (ref 28–170)
Saturation Ratios: 9 % — ABNORMAL LOW (ref 10.4–31.8)
TIBC: 185 ug/dL — AB (ref 250–450)
UIBC: 169 ug/dL

## 2017-08-19 LAB — AMYLASE: AMYLASE: 33 U/L (ref 28–100)

## 2017-08-19 LAB — CBC WITH DIFFERENTIAL/PLATELET
Basophils Absolute: 0 10*3/uL (ref 0.0–0.1)
Basophils Relative: 0 %
EOS PCT: 2 %
Eosinophils Absolute: 0.1 10*3/uL (ref 0.0–0.7)
HCT: 32.2 % — ABNORMAL LOW (ref 36.0–46.0)
Hemoglobin: 9.4 g/dL — ABNORMAL LOW (ref 12.0–15.0)
LYMPHS PCT: 15 %
Lymphs Abs: 0.9 10*3/uL (ref 0.7–4.0)
MCH: 23.1 pg — AB (ref 26.0–34.0)
MCHC: 29.2 g/dL — ABNORMAL LOW (ref 30.0–36.0)
MCV: 79.1 fL (ref 78.0–100.0)
MONO ABS: 0.6 10*3/uL (ref 0.1–1.0)
Monocytes Relative: 9 %
Neutro Abs: 4.7 10*3/uL (ref 1.7–7.7)
Neutrophils Relative %: 74 %
PLATELETS: 438 10*3/uL — AB (ref 150–400)
RBC: 4.07 MIL/uL (ref 3.87–5.11)
RDW: 17.3 % — AB (ref 11.5–15.5)
WBC: 6.4 10*3/uL (ref 4.0–10.5)

## 2017-08-19 LAB — FOLATE: Folate: 10.8 ng/mL (ref >5.9)

## 2017-08-19 LAB — TSH: TSH: 3.346 u[IU]/mL (ref 0.350–4.500)

## 2017-08-19 LAB — VITAMIN B12: Vitamin B-12: 564 pg/mL (ref 180–914)

## 2017-08-22 ENCOUNTER — Encounter (HOSPITAL_COMMUNITY)
Admission: RE | Admit: 2017-08-22 | Discharge: 2017-08-22 | Disposition: A | Discharge status: Home / Self Care | Payer: Medicare HMO | Source: Skilled Nursing Facility | Attending: *Deleted | Admitting: *Deleted

## 2017-08-22 LAB — COMPREHENSIVE METABOLIC PANEL
ALBUMIN: 2.6 g/dL — AB (ref 3.5–5.0)
ALK PHOS: 62 U/L (ref 38–126)
ALT: 11 U/L — AB (ref 14–54)
AST: 11 U/L — AB (ref 15–41)
Anion gap: 8 (ref 5–15)
BILIRUBIN TOTAL: 0.2 mg/dL — AB (ref 0.3–1.2)
BUN: 12 mg/dL (ref 6–20)
CALCIUM: 8.8 mg/dL — AB (ref 8.9–10.3)
CO2: 31 mmol/L (ref 22–32)
CREATININE: 0.48 mg/dL (ref 0.44–1.00)
Chloride: 96 mmol/L — ABNORMAL LOW (ref 101–111)
GFR calc Af Amer: >60 mL/min (ref >60)
GFR calc non Af Amer: >60 mL/min (ref >60)
GLUCOSE: 132 mg/dL — AB (ref 65–99)
Potassium: 4 mmol/L (ref 3.5–5.1)
Sodium: 135 mmol/L (ref 135–145)
TOTAL PROTEIN: 6 g/dL — AB (ref 6.5–8.1)

## 2017-08-22 LAB — FOLATE RBC
FOLATE, HEMOLYSATE: 402.7 ng/mL
Folate, RBC: 1312 ng/mL (ref >498)
Hematocrit: 30.7 % — ABNORMAL LOW (ref 34.0–46.6)

## 2017-08-30 ENCOUNTER — Encounter: Payer: Self-pay | Admitting: Internal Medicine

## 2017-08-30 ENCOUNTER — Non-Acute Institutional Stay (SKILLED_NURSING_FACILITY): Payer: Medicare HMO | Admitting: Internal Medicine

## 2017-08-30 DIAGNOSIS — F329 Major depressive disorder, single episode, unspecified: Secondary | ICD-10-CM

## 2017-08-30 DIAGNOSIS — F419 Anxiety disorder, unspecified: Secondary | ICD-10-CM

## 2017-08-30 DIAGNOSIS — E0800 Diabetes mellitus due to underlying condition with hyperosmolarity without nonketotic hyperglycemic-hyperosmolar coma (NKHHC): Secondary | ICD-10-CM

## 2017-08-30 DIAGNOSIS — I2699 Other pulmonary embolism without acute cor pulmonale: Secondary | ICD-10-CM | POA: Diagnosis not present

## 2017-08-30 DIAGNOSIS — R600 Localized edema: Secondary | ICD-10-CM

## 2017-08-30 DIAGNOSIS — Z8719 Personal history of other diseases of the digestive system: Secondary | ICD-10-CM | POA: Diagnosis not present

## 2017-08-30 DIAGNOSIS — F32A Depression, unspecified: Secondary | ICD-10-CM

## 2017-08-30 NOTE — Progress Notes (Signed)
Location:   [facility] Room Number: 124/P Place of Service:  SNF (31) Provider:  Abran Duke, MD  Patient Care Team: [name], MD as PCP - General (Internal Medicine) Wyline Mood Dorothe Pea, MD as Consulting Physician (Cardiology) Marily Lente, NP as Nurse Practitioner (Cardiology)  Extended Emergency Contact Information Primary Emergency Contact: [place], [place] [address] Darden Amber of [place] Home Phone: [phone] Relation: Daughter Secondary Emergency Contact: Trent,Patricia  [place] Home Phone: [phone] Relation: Daughter  Code Status:  Full Code Goals of care: Advanced Directive information Advanced Directives 08/30/2017  Does Patient Have a Medical Advance Directive? Yes  Type of Advance Directive (No Data)  Does patient want to make changes to medical advance directive? No - Patient declined  Would patient like information on creating a medical advance directive? No - Patient declined  Pre-existing out of facility DNR order (yellow form or pink MOST form) -     Chief Complaint  Patient presents with  . Acute Visit    F/U DM    HPI:  Pt is a 83 y.o. female seen today for an acute visit for Worsening LE swelling.  Patient with h/o CAD S/P CABG in 2013 MR, S/P Pacemaker for Complete Heart Block, Chronic Systolic Failure, S/P Hip surgery in 06/18 followed by T 9 Compression Fracture who was admitted to SNF after hospitalization for Acute PE and Bilateral DVT. She has been on Xarelto. Patient has been stable but her daughter has noticed that she has worsening of her Edema in LE. Her daughter said she has been on lasix and was taken off due to dehydration. Patient also continues to be SOB needing Oxygen. She denies any Cough or chest pain. Her daughter also wanted her to be started on Prozac and metformin again. She thinks her mood has been down recently.   Past Medical  History:  Diagnosis Date  . CAD (coronary artery disease)    a. s/p PCI to LAD '08, Severe 3V CAD by cath 03/27/12 --> CABG  . CHF (congestive heart failure) (HCC)   . Complete heart block (HCC)    a. s/p STJ dual chamber pacemaker  . Depression   . Hypertension   . Hypothyroidism   . Mitral regurgitation    moderate by cath, mild by echo 03/2012  . Osteoarthrosis, unspecified whether generalized or localized, unspecified site   . Paroxysmal atrial fibrillation (HCC)    a. identified s/p CABG b. OAC discontinued 02/2014 after no recurrence  . Personal history of other diseases of digestive system   . Postoperative anemia due to acute blood loss 06/04/2017  . Type II or unspecified type diabetes mellitus without mention of complication, not stated as uncontrolled   . Unspecified glaucoma(365.9)   . Unspecified hemorrhoids without mention of complication    Past Surgical History:  Procedure Laterality Date  . CHOLECYSTECTOMY  12/2006  . CORONARY ARTERY BYPASS GRAFT  04/03/2012   Procedure: CORONARY ARTERY BYPASS GRAFTING (CABG);  Surgeon: Kerin Perna, MD;  Location: [facility] OR;  Service: Open Heart Surgery;  Laterality: N/A;  CABG x four;  using left internal mammary artery and bilateral greater saphenous veins  . ERCP  2008  . INTRAMEDULLARY (IM) NAIL INTERTROCHANTERIC Right 06/02/2017   Procedure: INTRAMEDULLARY (IM) NAIL INTERTROCHANTRIC RIGHT HIP;  Surgeon: Sheral Apley, MD;  Location: [facility] OR;  Service: Orthopedics;  Laterality: Right;  . LEFT HEART CATHETERIZATION  WITH CORONARY ANGIOGRAM N/A 03/27/2012   Procedure: LEFT HEART CATHETERIZATION WITH CORONARY ANGIOGRAM;  Surgeon: Herby Abraham, MD;  Location: [facility] CATH LAB;  Service: Cardiovascular;  Laterality: N/A;  . MITRAL VALVE REPAIR  04/03/2012   Procedure: MITRAL VALVE REPAIR (MVR);  Surgeon: Kerin Perna, MD;  Location: [facility] OR;  Service: Open Heart Surgery;  Laterality: N/A;  . PERMANENT PACEMAKER INSERTION N/A 02/26/2015   STJ  Assurity dual chamber pacemaker implanted by Dr Johney Frame for symptomatic complete heart block    Allergies  Allergen Reactions  . Lisinopril Anaphylaxis  . Quinine Other (See Comments)    Pt states heart attack-like symptoms  . Penicillins Swelling and Other (See Comments)    Patient states that she had swelling at the injection site after her last Penicillin shot but no swelling in the throat.  . Sulfonamide Derivatives Rash    Outpatient Encounter Prescriptions as of 08/30/2017  Medication Sig  . amLODipine (NORVASC) 5 MG tablet Take 5 mg by mouth daily.  . carvedilol (COREG) 6.25 MG tablet TAKE ONE TABLET BY MOUTH TWICE DAILY  . docusate sodium (COLACE) 100 MG capsule Take 100 mg by mouth daily as needed for mild constipation.  . feeding supplement, GLUCERNA SHAKE, (GLUCERNA SHAKE) LIQD Take 237 mLs by mouth 2 (two) times daily between meals.  . INVOKANA 300 MG TABS tablet Take 1 tablet (300 mg total) by mouth daily.  [name] ketoconazole (NIZORAL) 2 % cream Apply 1 application topically 2 (two) times daily. Apply to perineal and sacral fungal rash  . Menthol, Topical Analgesic, 4 % GEL Apply prn to pain at spine  . oxycodone (OXY-IR) 5 MG capsule Take 1 capsule (5 mg total) by mouth every 6 (six) hours as needed for pain.  [name] oxycodone (OXY-IR) 5 MG capsule Take 5 mg by mouth daily.  . pantoprazole (PROTONIX) 40 MG tablet Take 40 mg by mouth 2 (two) times daily.  . polyethylene glycol powder (MIRALAX) powder Take 17 g by mouth daily as needed.  . rivaroxaban (XARELTO) 20 MG TABS tablet Take 20 mg by mouth daily with supper. Starting on 08/20/2017  . [DISCONTINUED] azithromycin (ZITHROMAX) 250 MG tablet 250 Mg. P.O. Daily for 4 additional days then discontinue  . [DISCONTINUED] docusate sodium (COLACE) 100 MG capsule Take 1 capsule (100 mg total) by mouth 2 (two) times daily. To prevent constipation while taking pain medication.  . [DISCONTINUED] fluconazole (DIFLUCAN) 100 MG tablet Take 100 mg  by mouth daily.  . [DISCONTINUED] omeprazole (PRILOSEC) 20 MG capsule Take 20 mg by mouth daily.   . [DISCONTINUED] Rivaroxaban (XARELTO) 15 MG TABS tablet Take 15 mg by mouth 2 (two) times daily with a meal. Until 08/19/17.   No facility-administered encounter medications on file as of 08/30/2017.      Review of Systems  Constitutional: Positive for activity change. Negative for appetite change, chills and fever.  HENT: Negative.   Respiratory: Positive for shortness of breath. Negative for cough.   Cardiovascular: Positive for leg swelling. Negative for chest pain.  Gastrointestinal: Negative.   Genitourinary: Negative.   Musculoskeletal: Positive for back pain.  Skin: Negative.   Neurological: Positive for weakness. Negative for dizziness and light-headedness.  Psychiatric/Behavioral: Positive for dysphoric mood. Negative for sleep disturbance. The patient is nervous/anxious.     Immunization History  Administered Date(s) Administered  . Influenza-Unspecified 09/26/2014  . Pneumococcal-Unspecified 09/26/2014   Pertinent  Health Maintenance Due  Topic Date Due  . FOOT EXAM  09/17/2017 (Originally 01/08/1942)  .  OPHTHALMOLOGY EXAM  09/17/2017 (Originally 01/08/1942)  . URINE MICROALBUMIN  09/17/2017 (Originally 01/08/1942)  . DEXA SCAN  09/17/2017 (Originally 01/07/1999)  . INFLUENZA VACCINE  11/26/2017 (Originally 07/27/2017)  . PNA vac Low Risk Adult (2 of 2 - PCV13) 11/26/2017 (Originally 09/27/2015)  . HEMOGLOBIN A1C  12/01/2017   No flowsheet data found. Functional Status Survey:    Vitals:   09/11/17 1458  BP: 100/62  Pulse: 71  Resp: 20  Temp: 99.4 F (37.4 C)   There is no height or weight on file to calculate BMI. Physical Exam  Constitutional: She is oriented to person, place, and time. She appears well-developed and well-nourished.  HENT:  Head: Normocephalic.  Mouth/Throat: Oropharynx is clear and moist.  Eyes: Pupils are equal, round, and reactive to light.    Neck: Neck supple.  Cardiovascular: Normal rate and normal heart sounds.   Pulmonary/Chest: Effort normal.  Bilateral Crackles  Abdominal: Soft. Bowel sounds are normal. She exhibits no distension. There is no tenderness. There is no rebound.  Musculoskeletal:  Moderate Edema Bilateral  Neurological: She is alert and oriented to person, place, and time.  Skin: Skin is warm and dry.  Psychiatric: She has a normal mood and affect. Her behavior is normal.    Labs reviewed:  Recent Labs  06/03/17 0753  08/11/17 0537  08/22/17 0630 08/31/17 0701 09/02/17 2019  NA 137  < > 138  < > 135 136 137  K 3.7  < > 3.3*  < > 4.0 4.1 4.7  CL 106  < > 105  < > 96* 100* 101  CO2 19*  < > 25  < > 31 28 26   GLUCOSE 122*  < > 97  < > 132* 143* 209*  BUN 14  < > 15  < > 12 18 25*  CREATININE 0.89  < > 0.52  < > 0.48 0.62 1.16*  CALCIUM 8.3*  < > 8.7*  < > 8.8* 8.9 9.2  MG 1.8  --  1.7  --   --   --   --   < > = values in this interval not displayed.  Recent Labs  08/08/17 1750 08/19/17 0726 08/22/17 0630  AST 16 14* 11*  ALT 6* 12* 11*  ALKPHOS 70 60 62  BILITOT 0.7 0.4 0.2*  PROT 7.0 5.5* 6.0*  ALBUMIN 3.4* 2.2* 2.6*    Recent Labs  08/18/17 0615 08/19/17 0726 08/19/17 0727 09/02/17 2019  WBC 6.9 6.4  --  6.2  NEUTROABS 4.9 4.7  --  3.4  HGB 9.4* 9.4*  --  10.0*  HCT 31.3* 32.2* 30.7* 33.5*  MCV 78.4 79.1  --  78.1  PLT 462* 438*  --  346   Lab Results  Component Value Date   TSH 3.346 08/19/2017   Lab Results  Component Value Date   HGBA1C 8.0 (H) 06/01/2017   Lab Results  Component Value Date   CHOL 149 03/29/2012   HDL 36 (L) 03/29/2012   LDLCALC 89 03/29/2012   TRIG 121 03/29/2012   CHOLHDL 4.1 03/29/2012    Significant Diagnostic Results in last 30 days:  No results found.  Assessment/Plan  Bilateral leg edema With little worseningof edema will start her on Low dose of Lasix. Her Echo was 55 % in Hospital. Will continue to watch her weight. Repeat  BMP in few days to follow renal function.   acute pulmonary embolism Continue on Xarelto Patient is stable on oxygen  Diabetes mellitus  Per daughter restart her on Metformin. Continue Invokana .  Anxiety and depression Restart her on Prozac  H/O: GI bleed Patient is stable on Protonix. Follow up GI consult     Family/ staff Communication:   Labs/tests ordered:  BMP and CBC

## 2017-08-31 ENCOUNTER — Encounter (HOSPITAL_COMMUNITY)
Admission: RE | Admit: 2017-08-31 | Discharge: 2017-08-31 | Disposition: A | Discharge status: Home / Self Care | Payer: Medicare HMO | Source: Skilled Nursing Facility | Attending: Internal Medicine | Admitting: Internal Medicine

## 2017-08-31 DIAGNOSIS — I2510 Atherosclerotic heart disease of native coronary artery without angina pectoris: Secondary | ICD-10-CM | POA: Insufficient documentation

## 2017-08-31 DIAGNOSIS — I10 Essential (primary) hypertension: Secondary | ICD-10-CM | POA: Insufficient documentation

## 2017-08-31 DIAGNOSIS — J9601 Acute respiratory failure with hypoxia: Secondary | ICD-10-CM | POA: Insufficient documentation

## 2017-08-31 DIAGNOSIS — I82413 Acute embolism and thrombosis of femoral vein, bilateral: Secondary | ICD-10-CM | POA: Insufficient documentation

## 2017-08-31 LAB — BASIC METABOLIC PANEL
ANION GAP: 8 (ref 5–15)
BUN: 18 mg/dL (ref 6–20)
CHLORIDE: 100 mmol/L — AB (ref 101–111)
CO2: 28 mmol/L (ref 22–32)
Calcium: 8.9 mg/dL (ref 8.9–10.3)
Creatinine, Ser: 0.62 mg/dL (ref 0.44–1.00)
GFR calc Af Amer: >60 mL/min (ref >60)
GLUCOSE: 143 mg/dL — AB (ref 65–99)
POTASSIUM: 4.1 mmol/L (ref 3.5–5.1)
Sodium: 136 mmol/L (ref 135–145)

## 2017-09-02 ENCOUNTER — Non-Acute Institutional Stay (SKILLED_NURSING_FACILITY): Payer: Medicare HMO | Admitting: Internal Medicine

## 2017-09-02 ENCOUNTER — Encounter (HOSPITAL_COMMUNITY)
Admission: AD | Admit: 2017-09-02 | Discharge: 2017-09-02 | Disposition: A | Discharge status: Home / Self Care | Payer: Medicare HMO | Source: Skilled Nursing Facility | Attending: *Deleted | Admitting: *Deleted

## 2017-09-02 DIAGNOSIS — I2699 Other pulmonary embolism without acute cor pulmonale: Secondary | ICD-10-CM | POA: Diagnosis not present

## 2017-09-02 DIAGNOSIS — I509 Heart failure, unspecified: Secondary | ICD-10-CM

## 2017-09-02 DIAGNOSIS — E0800 Diabetes mellitus due to underlying condition with hyperosmolarity without nonketotic hyperglycemic-hyperosmolar coma (NKHHC): Secondary | ICD-10-CM

## 2017-09-02 DIAGNOSIS — Z8719 Personal history of other diseases of the digestive system: Secondary | ICD-10-CM | POA: Diagnosis not present

## 2017-09-02 DIAGNOSIS — I10 Essential (primary) hypertension: Secondary | ICD-10-CM | POA: Diagnosis not present

## 2017-09-02 DIAGNOSIS — I2510 Atherosclerotic heart disease of native coronary artery without angina pectoris: Secondary | ICD-10-CM | POA: Diagnosis not present

## 2017-09-02 LAB — CBC WITH DIFFERENTIAL/PLATELET
BASOS PCT: 1 %
Basophils Absolute: 0 10*3/uL (ref 0.0–0.1)
Eosinophils Absolute: 0.2 10*3/uL (ref 0.0–0.7)
Eosinophils Relative: 3 %
HEMATOCRIT: 33.5 % — AB (ref 36.0–46.0)
HEMOGLOBIN: 10 g/dL — AB (ref 12.0–15.0)
LYMPHS PCT: 29 %
Lymphs Abs: 1.8 10*3/uL (ref 0.7–4.0)
MCH: 23.3 pg — ABNORMAL LOW (ref 26.0–34.0)
MCHC: 29.9 g/dL — AB (ref 30.0–36.0)
MCV: 78.1 fL (ref 78.0–100.0)
MONO ABS: 0.7 10*3/uL (ref 0.1–1.0)
MONOS PCT: 12 %
NEUTROS ABS: 3.4 10*3/uL (ref 1.7–7.7)
Neutrophils Relative %: 55 %
Platelets: 346 10*3/uL (ref 150–400)
RBC: 4.29 MIL/uL (ref 3.87–5.11)
RDW: 18.1 % — AB (ref 11.5–15.5)
WBC: 6.2 10*3/uL (ref 4.0–10.5)

## 2017-09-02 LAB — BASIC METABOLIC PANEL
ANION GAP: 10 (ref 5–15)
BUN: 25 mg/dL — ABNORMAL HIGH (ref 6–20)
CHLORIDE: 101 mmol/L (ref 101–111)
CO2: 26 mmol/L (ref 22–32)
Calcium: 9.2 mg/dL (ref 8.9–10.3)
Creatinine, Ser: 1.16 mg/dL — ABNORMAL HIGH (ref 0.44–1.00)
GFR calc non Af Amer: 42 mL/min — ABNORMAL LOW (ref >60)
GFR, EST AFRICAN AMERICAN: 49 mL/min — AB (ref >60)
GLUCOSE: 209 mg/dL — AB (ref 65–99)
POTASSIUM: 4.7 mmol/L (ref 3.5–5.1)
Sodium: 137 mmol/L (ref 135–145)

## 2017-09-02 NOTE — Progress Notes (Signed)
This is a discharge note.  Level care skilled.  Facility is [facility].  Chief complaint discharge note.  History of present illness.  Patient is a pleasant 83 year old female with the previous medical history of coronary artery disease with CABG-mitral valve replacement-- history of pacemaker placement secondary to complete heart block-also chronic c CHF and recent hip surgery back in June.  Patient apparently was doing pretty well but fell on June 18 and sustained a right hip fracture that required surgery.  She went home on aspirin stay in rehabilitation for 4 weeks.  She was eventually walking with a walker but had back pain and was diagnosed with a T9 compression fracture which limited her mobility.  She eventually developed swelling in her legs and shortness of breath was found to have a submassive pulmonary embolism right main PA as well as lobar and distal pulmonary arteries.  Also per to be somewhat of a more chronic pulmonary emboli in her left lung.  She's been placed on Xarelto.  This is complicated with a history of GI bleed in the past with anemia and factor Coumadin was stopped  because of that.  She has gained strength during her stay here and her stay here is been relatively unremarkable.  She does have a history of CHF and Dr. Chales Abrahams did see her earlier this week and was concerned about some increased leg edema and started her on low-dose Lasix area  Also she is a type II diabetic on Invokana -Dr. Chales Abrahams was concerned about some higher blood sugars and started her on Glucophage as well-sugars are last few days have been more in the mid 100s in the morning ranging from the mid 100s to lower 200s later in the day.  She came in here on oxygen  but has not really needed this is says she is feeling stronger and better she is looking forward to going home still has some frailty here and would benefit from PT and OT as well as nursing to follow her multiple medical  issues.  Regards to pain with compression fracture she is on oxycodone 5 mg in the morning routinely and every 6 hours as needed when necessary which she uses once or twice a day.  She also is to be scheduled for follow-up with GI follow-up per GI bleed and anticoagulation recommendations.  Currently she is sitting in her wheelchair comfortably has no acute complaints says she's feeling stronger appetite apparently is better as well still has some back pain which limits her mobility it appears  . Past Medical History:  Diagnosis Date  . CAD (coronary artery disease)    a. s/p PCI to LAD '08, Severe 3V CAD by cath 03/27/12 --> CABG  . CHF (congestive heart failure) (HCC)   . Complete heart block (HCC)    a. s/p STJ dual chamber pacemaker  . Depression   . Hypertension   . Hypothyroidism   . Mitral regurgitation    moderate by cath, mild by echo 03/2012  . Osteoarthrosis, unspecified whether generalized or localized, unspecified site   . Paroxysmal atrial fibrillation (HCC)    a. identified s/p CABG b. OAC discontinued 02/2014 after no recurrence  . Personal history of other diseases of digestive system   . Postoperative anemia due to acute blood loss 06/04/2017  . Type II or unspecified type diabetes mellitus without mention of complication, not stated as uncontrolled   . Unspecified glaucoma(365.9)   . Unspecified hemorrhoids without mention of complication  Past Surgical History:  Procedure Laterality Date  . CHOLECYSTECTOMY  12/2006  . CORONARY ARTERY BYPASS GRAFT  04/03/2012   Procedure: CORONARY ARTERY BYPASS GRAFTING (CABG);  Surgeon: Kerin Perna, MD;  Location: [facility] OR;  Service: Open Heart Surgery;  Laterality: N/A;  CABG x four;  using left internal mammary artery and bilateral greater saphenous veins  . ERCP  2008  . INTRAMEDULLARY (IM) NAIL INTERTROCHANTERIC Right 06/02/2017   Procedure: INTRAMEDULLARY (IM) NAIL INTERTROCHANTRIC RIGHT HIP;   Surgeon: Sheral Apley, MD;  Location: [facility] OR;  Service: Orthopedics;  Laterality: Right;  . LEFT HEART CATHETERIZATION WITH CORONARY ANGIOGRAM N/A 03/27/2012   Procedure: LEFT HEART CATHETERIZATION WITH CORONARY ANGIOGRAM;  Surgeon: Herby Abraham, MD;  Location: [facility] CATH LAB;  Service: Cardiovascular;  Laterality: N/A;  . MITRAL VALVE REPAIR  04/03/2012   Procedure: MITRAL VALVE REPAIR (MVR);  Surgeon: Kerin Perna, MD;  Location: [facility] OR;  Service: Open Heart Surgery;  Laterality: N/A;  . PERMANENT PACEMAKER INSERTION N/A 02/26/2015   STJ Assurity dual chamber pacemaker implanted by Dr Johney Frame for symptomatic complete heart block    reports that she quit smoking about 12 years ago. Her smoking use included Cigarettes. She has never used smokeless tobacco. She reports that she does not drink alcohol or use drugs. Social History        Social History  . Marital status: Widowed    Spouse name: N/A  . Number of children: N/A  . Years of education: N/A      Occupational History  . Not on file.        Social History Main Topics  . Smoking status: Former Smoker    Types: Cigarettes    Quit date: 09/03/2004  . Smokeless tobacco: Never Used  . Alcohol use No  . Drug use: No  . Sexual activity: Not on file       Other Topics Concern  . Not on file      Social History Narrative  . No narrative on file    Functional Status Survey:       Family History  Problem Relation Age of Onset  . Heart disease Mother   . Heart disease Father 10       CABG  . Arrhythmia Brother        slow HR off and on since his 44s        Health Maintenance  Topic Date Due  . FOOT EXAM  09/17/2017 (Originally 01/08/1942)  . OPHTHALMOLOGY EXAM  09/17/2017 (Originally 01/08/1942)  . URINE MICROALBUMIN  09/17/2017 (Originally 01/08/1942)  . DEXA SCAN  09/17/2017 (Originally 01/07/1999)  . INFLUENZA VACCINE  11/26/2017 (Originally 07/27/2017)  . TETANUS/TDAP  11/26/2017 (Originally  01/07/1951)  . PNA vac Low Risk Adult (2 of 2 - PCV13) 11/26/2017 (Originally 09/27/2015)  . HEMOGLOBIN A1C  12/01/2017         Allergies  Allergen Reactions  . Lisinopril Anaphylaxis  . Quinine Other (See Comments)    Pt states heart attack-like symptoms  . Penicillins Swelling and Other (See Comments)    Patient states that she had swelling at the injection site after her last Penicillin shot but no swelling in the throat.  . Sulfonamide Derivatives Rash   Medications.  Norvasc 5 mg daily.  Invokana 300 mg every morning.  MiraLAX daily when necessary.  Oxycodone 5 mg every 6 hours when necessary.  Also every morning routinely hold for sedation or respiratory depression.  Protonix 40 mg  daily.  Biofreeze 4% as needed when necessary.  Colace 100 mg daily when necessary constipation.  Glucophage 500 mg every morning.  Lasix 20 mg every morning.  Coreg 6.25 mg twice a day.  Xarelto 20 mg by mouth daily.  Review of systems.  In general she does not complaining any fever or chills says she's feeling stronger.  Skin does not complain of rashes itching or diaphoresis.--She does have a relatively new wound to the right heel-O+ this was because of shooting rubbing against a healed this is being protected and followed by wound care  Head ears eyes nose mouth and throat does not complaining of any visual changes or sore throat.  Respiratory denies shortness breath or cough.  Cardiac denies chest pain has some lower extremity edema she feels this has improved somewhat very  GI is not complaining of any nausea vomiting diarrhea constipation or abdominal discomfort.  GU does not complain of dysuria.  Muscle skeletal continues to have some back pain actually wears a back brace often still has some weakness but has gained strength.  Neurologic does not complain of dizziness headache or syncope.  Insight is a good spirits does not complain of overt anxiety or  depression.  Physical exam.  Temp is 97.3 pulse 66 respirations 18 blood pressure taken manually 102/54 O2 saturation is in the 90s on room air.  In general this is a pleasant somewhat frail appearing elderly female in no distress.   In regards to skin -Right heel currently covered with protective dressing appears to have a small abrasion area I do not see signs of infection or acute tenderness Her skin is warm and dry.  Eyes sclera and conjunctiva are clear visual acuity appears grossly intact.  Oropharynx is clear mucous membranes moist.  Chest is clear to auscultation there is no labored breathing.  Heart is regular rate and rhythm without murmur gallop or rub she has I would say trace-1+ lower extremity edema she feels this has improved recently.  Abdomen is soft nontender with positive bowel sounds.  Musculoskeletal is able to ambulate with a walker still somewhat weak when trying to rise  above sitting position but is able to do this on her own-strength appears to be intact 04 extremities.  Neurologic is grossly intact lateralizing findings her speech is clear.  Psych she is alert and oriented very pleasant and appropriate.  Labs.  08/31/2017.  Sodium 134 potassium 4.1 BUN 18 creatinine 0.62.  All this 27 2018 albumin was 2.6.  08/19/2017.  WBC 6.4 hemoglobin 9.4 platelets 438.  Iron studies showed iron of 16-total iron binding capacity of 185.  Assessment and plan.  #1 history of bilateral DVTs and a pulmonary embolism-she is on Xarelto 20 mg a day she will need GI follow-up for anticoagulation recommendations complicated with her previous history of GI bleed.  Also will update a CBC to keep an eye on her hemoglobin which was stable most recent lab at 9.4.  #2 coronary artery disease this is been relatively asymptomatic she is status post CABG with pacemaker insertion because of complete heart block-she appears to be stable on thisregard--without complaints of  chest pain or shortness of breath she no longer requires oxygen.  #3 history of CHF again there was concern about some increased edema she is been started low-dose Lasix metabolic panel earlier this week shows stability we will update this --  edema appears to be improved per patient.  #4-history diabetes type 2 on Invokana and Glucophage  was recently started as noted above blood sugars appear more in the mid 100s in the morning somewhat more variable from mid 100s to lower 200s and evening at this point will monitor I do not see evidence of hypoglycemia.  #5-history of right hip repair June 2018 she will need continued PT and OT she does receive oxycodone for pain as noted above.  #6 history of thoracic compression fractures-again she does have a back brace as well as oxycodone for pain still has some limited mobility although she is able to use a walker would benefit from continued therapy.  #7-history again of GI bleed with transfusion the past will update a CBC she will need GI follow-up.  #8 history of complete heart block she is status post pacemaker clinically appears to be stable in this regard.  #9 history of hypertension continues on Coreg and Norvasc recent blood pressures range from the low 100s up to 120s-diastolics range from the 70s down to the 40s it appears-she is not symptomatic of any hypotension denies any syncope dizziness or lightheadedness.  Again will update a CBC as well as metabolic panel to assess for hemoglobin stability with history of GI bleed in the past also with recent initiation Lasix would like to make sure renal function is stable-.  She will be going home and be with family she will need a wheelchair to help with ambulation secondary to weakness as well as continued PT and OT and nursing support for multiple medical issues also will write for oxycodone 5 mg every morning routinely and every 6 hours when necessary appear she takes about 3 oxycodone a day will  write for 20 tablets with no refills.  She will have follow-up with primary care provider follow-up on this.  [id number]-of note greater than 30 minutes spent on this discharge summary-greater than 50% of time spent coordinating a care for numerous diagnoses

## 2017-09-07 ENCOUNTER — Encounter (HOSPITAL_COMMUNITY)
Admission: RE | Admit: 2017-09-07 | Discharge: 2017-09-07 | Disposition: A | Discharge status: Home / Self Care | Payer: Medicare HMO | Source: Skilled Nursing Facility | Attending: Internal Medicine | Admitting: Internal Medicine

## 2017-09-07 DIAGNOSIS — I2510 Atherosclerotic heart disease of native coronary artery without angina pectoris: Secondary | ICD-10-CM | POA: Insufficient documentation

## 2017-09-07 DIAGNOSIS — J9601 Acute respiratory failure with hypoxia: Secondary | ICD-10-CM | POA: Insufficient documentation

## 2017-09-07 DIAGNOSIS — I82413 Acute embolism and thrombosis of femoral vein, bilateral: Secondary | ICD-10-CM | POA: Insufficient documentation

## 2017-09-07 DIAGNOSIS — I10 Essential (primary) hypertension: Secondary | ICD-10-CM | POA: Insufficient documentation

## 2017-09-08 ENCOUNTER — Ambulatory Visit (INDEPENDENT_AMBULATORY_CARE_PROVIDER_SITE_OTHER): Payer: Medicare HMO | Admitting: *Deleted

## 2017-09-08 ENCOUNTER — Telehealth: Payer: Self-pay | Admitting: Cardiology

## 2017-09-08 DIAGNOSIS — I442 Atrioventricular block, complete: Secondary | ICD-10-CM

## 2017-09-08 NOTE — Telephone Encounter (Signed)
LMOVM reminding pt to send remote transmission.   

## 2017-09-09 ENCOUNTER — Encounter: Payer: Self-pay | Admitting: Cardiology

## 2017-09-11 ENCOUNTER — Encounter: Payer: Self-pay | Admitting: Internal Medicine

## 2017-09-12 NOTE — Progress Notes (Signed)
Remote pacemaker transmission.   

## 2017-09-16 ENCOUNTER — Encounter: Payer: Self-pay | Admitting: Cardiology

## 2017-09-16 LAB — CUP PACEART REMOTE DEVICE CHECK
Battery Voltage: 2.99 V
Brady Statistic AP VP Percent: 2.5 %
Brady Statistic AS VS Percent: 1 %
Brady Statistic RA Percent Paced: 2 %
Brady Statistic RV Percent Paced: 99 %
Date Time Interrogation Session: 20180914161507
Implantable Lead Implant Date: 20160302
Implantable Lead Location: 753860
Implantable Lead Model: 1948
Implantable Pulse Generator Implant Date: 20160302
Lead Channel Impedance Value: 650 Ohm
Lead Channel Pacing Threshold Amplitude: 0.5 V
Lead Channel Pacing Threshold Pulse Width: 0.4 ms
Lead Channel Pacing Threshold Pulse Width: 0.4 ms
Lead Channel Setting Pacing Amplitude: 1.625
Lead Channel Setting Pacing Pulse Width: 0.4 ms
Lead Channel Setting Sensing Sensitivity: 4 mV
MDC IDC LEAD IMPLANT DT: 20160302
MDC IDC LEAD LOCATION: 753859
MDC IDC MSMT BATTERY REMAINING LONGEVITY: 116 mo
MDC IDC MSMT BATTERY REMAINING PERCENTAGE: 95.5 %
MDC IDC MSMT LEADCHNL RA IMPEDANCE VALUE: 390 Ohm
MDC IDC MSMT LEADCHNL RA SENSING INTR AMPL: 2.3 mV
MDC IDC MSMT LEADCHNL RV PACING THRESHOLD AMPLITUDE: 1.375 V
MDC IDC MSMT LEADCHNL RV SENSING INTR AMPL: 10.9 mV
MDC IDC SET LEADCHNL RA PACING AMPLITUDE: 2 V
MDC IDC STAT BRADY AP VS PERCENT: 1 %
MDC IDC STAT BRADY AS VP PERCENT: 96 %
Pulse Gen Model: 2240
Pulse Gen Serial Number: 7730931

## 2017-09-18 ENCOUNTER — Other Ambulatory Visit: Payer: Self-pay | Admitting: Cardiology

## 2017-10-17 ENCOUNTER — Telehealth: Payer: Self-pay

## 2017-10-17 NOTE — Telephone Encounter (Signed)
LVM for pt to call back and [place]  If patient is able to and has transportation in order to turn off Ventricular auto capture.

## 2017-12-12 ENCOUNTER — Ambulatory Visit (INDEPENDENT_AMBULATORY_CARE_PROVIDER_SITE_OTHER): Payer: Medicare HMO | Admitting: *Deleted

## 2017-12-12 ENCOUNTER — Telehealth: Payer: Self-pay | Admitting: Cardiology

## 2017-12-12 DIAGNOSIS — I442 Atrioventricular block, complete: Secondary | ICD-10-CM

## 2017-12-12 NOTE — Progress Notes (Signed)
Remote pacemaker transmission.   

## 2017-12-12 NOTE — Telephone Encounter (Signed)
Spoke with pt and reminded pt of remote transmission that is due today. Pt verbalized understanding.   

## 2017-12-14 ENCOUNTER — Encounter: Payer: Self-pay | Admitting: Cardiology

## 2017-12-15 LAB — CUP PACEART REMOTE DEVICE CHECK
Battery Remaining Longevity: 82 mo
Battery Remaining Percentage: 95.5 %
Brady Statistic AP VP Percent: 2.5 %
Brady Statistic AP VS Percent: 1 %
Brady Statistic AS VP Percent: 96 %
Brady Statistic RV Percent Paced: 99 %
Date Time Interrogation Session: 20181217182318
Implantable Lead Location: 753860
Implantable Lead Model: 1948
Lead Channel Impedance Value: 360 Ohm
Lead Channel Pacing Threshold Amplitude: 0.75 V
Lead Channel Sensing Intrinsic Amplitude: 7.6 mV
Lead Channel Setting Pacing Amplitude: 2 V
Lead Channel Setting Pacing Pulse Width: 0.4 ms
Lead Channel Setting Sensing Sensitivity: 4 mV
MDC IDC LEAD IMPLANT DT: 20160302
MDC IDC LEAD IMPLANT DT: 20160302
MDC IDC LEAD LOCATION: 753859
MDC IDC MSMT BATTERY VOLTAGE: 2.96 V
MDC IDC MSMT LEADCHNL RA PACING THRESHOLD AMPLITUDE: 0.5 V
MDC IDC MSMT LEADCHNL RA PACING THRESHOLD PULSEWIDTH: 0.4 ms
MDC IDC MSMT LEADCHNL RA SENSING INTR AMPL: 2.3 mV
MDC IDC MSMT LEADCHNL RV IMPEDANCE VALUE: 590 Ohm
MDC IDC MSMT LEADCHNL RV PACING THRESHOLD PULSEWIDTH: 0.4 ms
MDC IDC PG IMPLANT DT: 20160302
MDC IDC SET LEADCHNL RV PACING AMPLITUDE: 5 V
MDC IDC STAT BRADY AS VS PERCENT: 1 %
MDC IDC STAT BRADY RA PERCENT PACED: 2.3 %
Pulse Gen Model: 2240
Pulse Gen Serial Number: 7730931

## 2018-01-27 ENCOUNTER — Ambulatory Visit (INDEPENDENT_AMBULATORY_CARE_PROVIDER_SITE_OTHER): Payer: Medicare HMO | Admitting: *Deleted

## 2018-01-27 DIAGNOSIS — I442 Atrioventricular block, complete: Secondary | ICD-10-CM

## 2018-01-27 LAB — CUP PACEART INCLINIC DEVICE CHECK
Brady Statistic RA Percent Paced: 2.7 %
Brady Statistic RV Percent Paced: 99.1 %
Date Time Interrogation Session: 20190201154518
Implantable Lead Implant Date: 20160302
Implantable Lead Location: 753859
Implantable Lead Model: 1948
Lead Channel Impedance Value: 587.5 Ohm
Lead Channel Pacing Threshold Amplitude: 0.75 V
Lead Channel Pacing Threshold Pulse Width: 0.4 ms
Lead Channel Sensing Intrinsic Amplitude: 4.7 mV
Lead Channel Setting Sensing Sensitivity: 4 mV
MDC IDC LEAD IMPLANT DT: 20160302
MDC IDC LEAD LOCATION: 753860
MDC IDC MSMT BATTERY REMAINING LONGEVITY: 82 mo
MDC IDC MSMT BATTERY VOLTAGE: 2.96 V
MDC IDC MSMT LEADCHNL RA IMPEDANCE VALUE: 387.5 Ohm
MDC IDC MSMT LEADCHNL RA PACING THRESHOLD AMPLITUDE: 0 V
MDC IDC MSMT LEADCHNL RA SENSING INTR AMPL: 1.5 mV
MDC IDC MSMT LEADCHNL RV PACING THRESHOLD AMPLITUDE: 0.75 V
MDC IDC MSMT LEADCHNL RV PACING THRESHOLD PULSEWIDTH: 0.4 ms
MDC IDC MSMT LEADCHNL RV PACING THRESHOLD PULSEWIDTH: 0.4 ms
MDC IDC PG IMPLANT DT: 20160302
MDC IDC SET LEADCHNL RA PACING AMPLITUDE: 2 V
MDC IDC SET LEADCHNL RV PACING AMPLITUDE: 2.5 V
MDC IDC SET LEADCHNL RV PACING PULSEWIDTH: 0.4 ms
Pulse Gen Model: 2240
Pulse Gen Serial Number: 7730931

## 2018-01-27 NOTE — Progress Notes (Signed)
Pacemaker check in clinic d/t "High" output mode in RV. Normal device function. Threshold, sensing, impedances consistent with previous measurements. Device programmed to maximize longevity. 51% AT/AF burden, presently in AF + xarelto. (1) high ventricular rate episode noted x 27bts. Device programmed at appropriate safety margins. RV output fixed at 2.5V @0 .4ms. Histogram distribution appropriate for patient activity level. Device programmed to optimize intrinsic conduction. Estimated longevity 6.9-8.9 years. Patient will follow up as scheduled.

## 2018-03-13 ENCOUNTER — Ambulatory Visit (INDEPENDENT_AMBULATORY_CARE_PROVIDER_SITE_OTHER): Payer: Medicare HMO | Admitting: *Deleted

## 2018-03-13 ENCOUNTER — Telehealth: Payer: Self-pay | Admitting: Cardiology

## 2018-03-13 DIAGNOSIS — I442 Atrioventricular block, complete: Secondary | ICD-10-CM

## 2018-03-13 NOTE — Telephone Encounter (Signed)
LMOVM reminding pt to send remote transmission.   

## 2018-03-14 ENCOUNTER — Inpatient Hospital Stay (HOSPITAL_COMMUNITY)
Admission: EM | Admit: 2018-03-14 | Discharge: 2018-03-22 | DRG: 378 | Disposition: A | Discharge status: Skilled Nursing Facility | Payer: Medicare HMO | Attending: Family Medicine | Admitting: Family Medicine

## 2018-03-14 ENCOUNTER — Other Ambulatory Visit: Payer: Self-pay

## 2018-03-14 ENCOUNTER — Emergency Department (HOSPITAL_COMMUNITY): Payer: Medicare HMO

## 2018-03-14 ENCOUNTER — Encounter (HOSPITAL_COMMUNITY): Payer: Self-pay | Admitting: Emergency Medicine

## 2018-03-14 DIAGNOSIS — Z888 Allergy status to other drugs, medicaments and biological substances status: Secondary | ICD-10-CM | POA: Diagnosis not present

## 2018-03-14 DIAGNOSIS — F32A Depression, unspecified: Secondary | ICD-10-CM | POA: Diagnosis present

## 2018-03-14 DIAGNOSIS — S72001A Fracture of unspecified part of neck of right femur, initial encounter for closed fracture: Secondary | ICD-10-CM

## 2018-03-14 DIAGNOSIS — S0003XA Contusion of scalp, initial encounter: Secondary | ICD-10-CM | POA: Diagnosis not present

## 2018-03-14 DIAGNOSIS — I110 Hypertensive heart disease with heart failure: Secondary | ICD-10-CM | POA: Diagnosis present

## 2018-03-14 DIAGNOSIS — Z79899 Other long term (current) drug therapy: Secondary | ICD-10-CM

## 2018-03-14 DIAGNOSIS — Z86711 Personal history of pulmonary embolism: Secondary | ICD-10-CM

## 2018-03-14 DIAGNOSIS — K228 Other specified diseases of esophagus: Secondary | ICD-10-CM | POA: Diagnosis not present

## 2018-03-14 DIAGNOSIS — D62 Acute posthemorrhagic anemia: Secondary | ICD-10-CM | POA: Diagnosis present

## 2018-03-14 DIAGNOSIS — Z882 Allergy status to sulfonamides status: Secondary | ICD-10-CM | POA: Diagnosis not present

## 2018-03-14 DIAGNOSIS — I2699 Other pulmonary embolism without acute cor pulmonale: Secondary | ICD-10-CM | POA: Diagnosis present

## 2018-03-14 DIAGNOSIS — E119 Type 2 diabetes mellitus without complications: Secondary | ICD-10-CM | POA: Diagnosis present

## 2018-03-14 DIAGNOSIS — H4010X0 Unspecified open-angle glaucoma, stage unspecified: Secondary | ICD-10-CM

## 2018-03-14 DIAGNOSIS — E02 Subclinical iodine-deficiency hypothyroidism: Secondary | ICD-10-CM

## 2018-03-14 DIAGNOSIS — Z880 Allergy status to penicillin: Secondary | ICD-10-CM

## 2018-03-14 DIAGNOSIS — I480 Paroxysmal atrial fibrillation: Secondary | ICD-10-CM | POA: Diagnosis present

## 2018-03-14 DIAGNOSIS — Z951 Presence of aortocoronary bypass graft: Secondary | ICD-10-CM | POA: Diagnosis not present

## 2018-03-14 DIAGNOSIS — K449 Diaphragmatic hernia without obstruction or gangrene: Secondary | ICD-10-CM | POA: Diagnosis present

## 2018-03-14 DIAGNOSIS — Z7902 Long term (current) use of antithrombotics/antiplatelets: Secondary | ICD-10-CM

## 2018-03-14 DIAGNOSIS — K317 Polyp of stomach and duodenum: Secondary | ICD-10-CM | POA: Diagnosis present

## 2018-03-14 DIAGNOSIS — J9811 Atelectasis: Secondary | ICD-10-CM

## 2018-03-14 DIAGNOSIS — E8770 Fluid overload, unspecified: Secondary | ICD-10-CM | POA: Diagnosis not present

## 2018-03-14 DIAGNOSIS — Y92230 Patient room in hospital as the place of occurrence of the external cause: Secondary | ICD-10-CM

## 2018-03-14 DIAGNOSIS — Z7984 Long term (current) use of oral hypoglycemic drugs: Secondary | ICD-10-CM | POA: Diagnosis not present

## 2018-03-14 DIAGNOSIS — I2510 Atherosclerotic heart disease of native coronary artery without angina pectoris: Secondary | ICD-10-CM | POA: Diagnosis present

## 2018-03-14 DIAGNOSIS — I509 Heart failure, unspecified: Secondary | ICD-10-CM | POA: Diagnosis present

## 2018-03-14 DIAGNOSIS — I442 Atrioventricular block, complete: Secondary | ICD-10-CM

## 2018-03-14 DIAGNOSIS — K921 Melena: Principal | ICD-10-CM | POA: Diagnosis present

## 2018-03-14 DIAGNOSIS — F329 Major depressive disorder, single episode, unspecified: Secondary | ICD-10-CM | POA: Diagnosis present

## 2018-03-14 DIAGNOSIS — I252 Old myocardial infarction: Secondary | ICD-10-CM | POA: Diagnosis not present

## 2018-03-14 DIAGNOSIS — E039 Hypothyroidism, unspecified: Secondary | ICD-10-CM | POA: Diagnosis present

## 2018-03-14 DIAGNOSIS — D649 Anemia, unspecified: Secondary | ICD-10-CM

## 2018-03-14 DIAGNOSIS — M1990 Unspecified osteoarthritis, unspecified site: Secondary | ICD-10-CM | POA: Diagnosis present

## 2018-03-14 DIAGNOSIS — F320 Major depressive disorder, single episode, mild: Secondary | ICD-10-CM

## 2018-03-14 DIAGNOSIS — D509 Iron deficiency anemia, unspecified: Secondary | ICD-10-CM | POA: Diagnosis present

## 2018-03-14 DIAGNOSIS — K264 Chronic or unspecified duodenal ulcer with hemorrhage: Secondary | ICD-10-CM

## 2018-03-14 DIAGNOSIS — E785 Hyperlipidemia, unspecified: Secondary | ICD-10-CM | POA: Diagnosis present

## 2018-03-14 DIAGNOSIS — Z9889 Other specified postprocedural states: Secondary | ICD-10-CM

## 2018-03-14 DIAGNOSIS — Z950 Presence of cardiac pacemaker: Secondary | ICD-10-CM

## 2018-03-14 DIAGNOSIS — K922 Gastrointestinal hemorrhage, unspecified: Secondary | ICD-10-CM | POA: Diagnosis present

## 2018-03-14 DIAGNOSIS — H409 Unspecified glaucoma: Secondary | ICD-10-CM | POA: Diagnosis present

## 2018-03-14 DIAGNOSIS — I10 Essential (primary) hypertension: Secondary | ICD-10-CM | POA: Diagnosis present

## 2018-03-14 DIAGNOSIS — Z4889 Encounter for other specified surgical aftercare: Secondary | ICD-10-CM | POA: Diagnosis not present

## 2018-03-14 DIAGNOSIS — W1839XA Other fall on same level, initial encounter: Secondary | ICD-10-CM | POA: Diagnosis not present

## 2018-03-14 DIAGNOSIS — I2601 Septic pulmonary embolism with acute cor pulmonale: Secondary | ICD-10-CM

## 2018-03-14 LAB — CBC
HCT: 25.8 % — ABNORMAL LOW (ref 36.0–46.0)
Hemoglobin: 6.4 g/dL — CL (ref 12.0–15.0)
MCH: 16.4 pg — ABNORMAL LOW (ref 26.0–34.0)
MCHC: 24.8 g/dL — AB (ref 30.0–36.0)
MCV: 66 fL — AB (ref 78.0–100.0)
PLATELETS: 262 10*3/uL (ref 150–400)
RBC: 3.91 MIL/uL (ref 3.87–5.11)
RDW: 19.5 % — AB (ref 11.5–15.5)
WBC: 5.2 10*3/uL (ref 4.0–10.5)

## 2018-03-14 LAB — COMPREHENSIVE METABOLIC PANEL
ALT: 18 U/L (ref 14–54)
AST: 24 U/L (ref 15–41)
Albumin: 3.1 g/dL — ABNORMAL LOW (ref 3.5–5.0)
Alkaline Phosphatase: 59 U/L (ref 38–126)
Anion gap: 11 (ref 5–15)
BUN: 15 mg/dL (ref 6–20)
CHLORIDE: 106 mmol/L (ref 101–111)
CO2: 23 mmol/L (ref 22–32)
CREATININE: 0.71 mg/dL (ref 0.44–1.00)
Calcium: 8.6 mg/dL — ABNORMAL LOW (ref 8.9–10.3)
GFR calc Af Amer: >60 mL/min (ref >60)
Glucose, Bld: 120 mg/dL — ABNORMAL HIGH (ref 65–99)
Potassium: 3.5 mmol/L (ref 3.5–5.1)
Sodium: 140 mmol/L (ref 135–145)
Total Bilirubin: 0.7 mg/dL (ref 0.3–1.2)
Total Protein: 6.7 g/dL (ref 6.5–8.1)

## 2018-03-14 LAB — BRAIN NATRIURETIC PEPTIDE: B NATRIURETIC PEPTIDE 5: 961 pg/mL — AB (ref 0.0–100.0)

## 2018-03-14 LAB — CUP PACEART REMOTE DEVICE CHECK
Battery Remaining Longevity: 114 mo
Battery Remaining Percentage: 95.5 %
Brady Statistic AP VP Percent: 16 %
Brady Statistic AP VS Percent: 0 %
Brady Statistic AS VP Percent: 83 %
Brady Statistic RV Percent Paced: 99 %
Date Time Interrogation Session: 20190319084006
Implantable Lead Implant Date: 20160302
Implantable Lead Location: 753860
Implantable Lead Model: 1948
Lead Channel Pacing Threshold Amplitude: 0.5 V
Lead Channel Pacing Threshold Amplitude: 0.75 V
Lead Channel Sensing Intrinsic Amplitude: 1.5 mV
Lead Channel Sensing Intrinsic Amplitude: 4.7 mV
Lead Channel Setting Pacing Amplitude: 2 V
Lead Channel Setting Pacing Pulse Width: 0.4 ms
Lead Channel Setting Sensing Sensitivity: 4 mV
MDC IDC LEAD IMPLANT DT: 20160302
MDC IDC LEAD LOCATION: 753859
MDC IDC MSMT BATTERY VOLTAGE: 2.99 V
MDC IDC MSMT LEADCHNL RA IMPEDANCE VALUE: 360 Ohm
MDC IDC MSMT LEADCHNL RA PACING THRESHOLD PULSEWIDTH: 0.4 ms
MDC IDC MSMT LEADCHNL RV IMPEDANCE VALUE: 540 Ohm
MDC IDC MSMT LEADCHNL RV PACING THRESHOLD PULSEWIDTH: 0.4 ms
MDC IDC PG IMPLANT DT: 20160302
MDC IDC SET LEADCHNL RV PACING AMPLITUDE: 2.5 V
MDC IDC STAT BRADY AS VS PERCENT: 1 %
MDC IDC STAT BRADY RA PERCENT PACED: 5 %
Pulse Gen Model: 2240
Pulse Gen Serial Number: 7730931

## 2018-03-14 LAB — MAGNESIUM: Magnesium: 1.9 mg/dL (ref 1.7–2.4)

## 2018-03-14 LAB — PREPARE RBC (CROSSMATCH)

## 2018-03-14 LAB — CBG MONITORING, ED: Glucose-Capillary: 134 mg/dL — ABNORMAL HIGH (ref 65–99)

## 2018-03-14 LAB — PHOSPHORUS: PHOSPHORUS: 3.1 mg/dL (ref 2.5–4.6)

## 2018-03-14 LAB — GLUCOSE, CAPILLARY: GLUCOSE-CAPILLARY: 124 mg/dL — AB (ref 65–99)

## 2018-03-14 LAB — TROPONIN I: Troponin I: <0.03 ng/mL (ref <0.03)

## 2018-03-14 MED ORDER — SODIUM CHLORIDE 0.9 % IV SOLN
10.0000 mL/h | Freq: Once | INTRAVENOUS | Status: AC
Start: 2018-03-14 — End: 2018-03-15
  Administered 2018-03-15: 10 mL/h via INTRAVENOUS

## 2018-03-14 MED ORDER — ALBUTEROL SULFATE (2.5 MG/3ML) 0.083% IN NEBU
5.0000 mg | INHALATION_SOLUTION | Freq: Once | RESPIRATORY_TRACT | Status: AC
  Administered 2018-03-14: 5 mg via RESPIRATORY_TRACT
  Filled 2018-03-14: qty 6

## 2018-03-14 MED ORDER — PANTOPRAZOLE SODIUM 40 MG IV SOLR
40.0000 mg | Freq: Two times a day (BID) | INTRAVENOUS | Status: DC
  Administered 2018-03-14 – 2018-03-18 (×8): 40 mg via INTRAVENOUS
  Filled 2018-03-14 (×8): qty 40

## 2018-03-14 NOTE — Progress Notes (Signed)
Remote pacemaker transmission.   

## 2018-03-14 NOTE — ED Notes (Signed)
Respiratory called concerning albuterol neb tx ordered

## 2018-03-14 NOTE — ED Provider Notes (Signed)
[facility] EMERGENCY DEPARTMENT Provider Note   CSN: [id number] Arrival date & time: 03/14/18  1630     History   Chief Complaint Chief Complaint  Patient presents with  . Shortness of Breath    HPI [name] P Bruschi is a 84 y.o. female.  HPI  The patient is an 84 year old female, very pleasant, history of coronary disease, severe three-vessel, has had congestive heart failure, has had a complete heart block status post pacemaker, hypertension and a history of a pulmonary embolism for which she is currently taking Xarelto and has been for the last 6 months or so.  She reports that she has been increasingly fatigued and weak at home, today she developed increasing amounts of weakness and was noted to have several dark stools over the last 2 days and reports that this morning she even had some bright red mucus from her rectum.  She denies abdominal pain chest pain or coughing but has had some wheezing and mild shortness of breath.  The patient is known to have CHF, she has chronic lower extremity swelling.  Nothing seems to make the dark stools better or worse, she does not have any history of peptic ulcer disease that she is aware of and reports having both colonoscopies and endoscopies in the past  Additional information was obtained from the family member who is here with her who states that she has appeared increasingly pale and increasingly weak generally.  There is no focal weakness.  Medical record reviewed, shows that she was actually seen by Dr. fields back in 2009 and had reported anemia and rectal bleeding.  She does report that when she was on Coumadin in the past she had some rectal bleeding  Past Medical History:  Diagnosis Date  . CAD (coronary artery disease)    a. s/p PCI to LAD '08, Severe 3V CAD by cath 03/27/12 --> CABG  . CHF (congestive heart failure) (HCC)   . Complete heart block (HCC)    a. s/p STJ dual chamber pacemaker  . Depression   . Hypertension   .  Hypothyroidism   . Mitral regurgitation    moderate by cath, mild by echo 03/2012  . Osteoarthrosis, unspecified whether generalized or localized, unspecified site   . Paroxysmal atrial fibrillation (HCC)    a. identified s/p CABG b. OAC discontinued 02/2014 after no recurrence  . Personal history of other diseases of digestive system   . Postoperative anemia due to acute blood loss 06/04/2017  . Type II or unspecified type diabetes mellitus without mention of complication, not stated as uncontrolled   . Unspecified glaucoma(365.9)   . Unspecified hemorrhoids without mention of complication     Patient Active Problem List   Diagnosis Date Noted  . Anemia 08/18/2017  . Pressure injury of skin 08/14/2017  . Pulmonary emboli (HCC) 08/08/2017  . Acute respiratory failure with hypoxia (HCC) 08/08/2017  . Postoperative anemia due to acute blood loss 06/04/2017  . Microcytic anemia 06/01/2017  . Closed right hip fracture, initial encounter (HCC) 06/01/2017  . Pacemaker 05/30/2015  . Complete heart block (HCC) 02/24/2015  . Encounter for therapeutic drug monitoring 02/04/2014  . History of MI (myocardial infarction) 07/17/2012  . Hyperlipidemia LDL goal < 70 07/17/2012  . S/P CABG x 4 04/14/2012  . S/P MVR (mitral valve repair) 04/13/2012  . Medication intolerance 04/13/2012  . Coronary artery disease 03/28/2012  . Diabetes (HCC) 04/24/2009  . GLAUCOMA 04/24/2009  . Essential hypertension 04/24/2009  . HEMORRHOIDS  04/24/2009  . DEGENERATIVE JOINT DISEASE 04/24/2009  . DIVERTICULITIS, HX OF 04/24/2009    Past Surgical History:  Procedure Laterality Date  . CHOLECYSTECTOMY  12/2006  . CORONARY ARTERY BYPASS GRAFT  04/03/2012   Procedure: CORONARY ARTERY BYPASS GRAFTING (CABG);  Surgeon: Kerin Perna, MD;  Location: [facility] OR;  Service: Open Heart Surgery;  Laterality: N/A;  CABG x four;  using left internal mammary artery and bilateral greater saphenous veins  . ERCP  2008  .  INTRAMEDULLARY (IM) NAIL INTERTROCHANTERIC Right 06/02/2017   Procedure: INTRAMEDULLARY (IM) NAIL INTERTROCHANTRIC RIGHT HIP;  Surgeon: Sheral Apley, MD;  Location: [facility] OR;  Service: Orthopedics;  Laterality: Right;  . LEFT HEART CATHETERIZATION WITH CORONARY ANGIOGRAM N/A 03/27/2012   Procedure: LEFT HEART CATHETERIZATION WITH CORONARY ANGIOGRAM;  Surgeon: Herby Abraham, MD;  Location: [facility] CATH LAB;  Service: Cardiovascular;  Laterality: N/A;  . MITRAL VALVE REPAIR  04/03/2012   Procedure: MITRAL VALVE REPAIR (MVR);  Surgeon: Kerin Perna, MD;  Location: [facility] OR;  Service: Open Heart Surgery;  Laterality: N/A;  . PERMANENT PACEMAKER INSERTION N/A 02/26/2015   STJ Assurity dual chamber pacemaker implanted by Dr Johney Frame for symptomatic complete heart block    OB History    No data available       Home Medications    Prior to Admission medications   Medication Sig Start Date End Date Taking? Authorizing Provider  amLODipine (NORVASC) 5 MG tablet Take 5 mg by mouth daily. 05/23/15  Yes [provider]  carvedilol (COREG) 6.25 MG tablet TAKE ONE TABLET BY MOUTH TWICE DAILY 09/19/17  Yes Branch, Dorothe Pea, MD  Cholecalciferol (VITAMIN D3) 2000 units TABS Take 2,000 Units by mouth daily.   Yes [provider]  docusate sodium (COLACE) 100 MG capsule Take 100 mg by mouth daily as needed for mild constipation.   Yes [provider]  empagliflozin (JARDIANCE) 25 MG TABS tablet Take 25 mg by mouth daily.   Yes [provider]  FLUoxetine (PROZAC) 20 MG capsule Take 20 mg by mouth daily.   Yes [provider]  furosemide (LASIX) 20 MG tablet Take 20 mg by mouth daily.    Yes [provider]  ibuprofen (ADVIL,MOTRIN) 200 MG tablet Take 200 mg by mouth every 6 (six) hours as needed.   Yes [provider]  metFORMIN (GLUCOPHAGE) 500 MG tablet Take by mouth daily with breakfast.   Yes [provider]  omeprazole (PRILOSEC) 20 MG  capsule Take 20 mg by mouth daily.   Yes [provider]  polyethylene glycol powder (MIRALAX) powder Take 17 g by mouth daily as needed.   Yes [provider]  rivaroxaban (XARELTO) 20 MG TABS tablet Take 20 mg by mouth daily with supper. Starting on 08/20/2017   Yes [provider]  oxycodone (OXY-IR) 5 MG capsule Take 1 capsule (5 mg total) by mouth every 6 (six) hours as needed for pain. Patient not taking: Reported on 03/14/2018 08/16/17   [name], MD    Family History Family History  Problem Relation Age of Onset  . Heart disease Mother   . Heart disease Father 22       CABG  . Arrhythmia Brother        slow HR off and on since his 81s    Social History Social History   Tobacco Use  . Smoking status: Former Smoker    Types: Cigarettes    Last attempt to quit: 09/03/2004  Years since quitting: 13.5  . Smokeless tobacco: Never Used  Substance Use Topics  . Alcohol use: No    Alcohol/week: 0.0 oz  . Drug use: No     Allergies   Lisinopril; Quinine; Penicillins; and Sulfonamide derivatives   Review of Systems Review of Systems  All other systems reviewed and are negative.    Physical Exam Updated Vital Signs BP (!) 156/67   Pulse 70   Temp 98 F (36.7 C) (Oral)   Resp 18   Ht 5\' 8"  (1.727 m)   Wt 72.6 kg (160 lb)   SpO2 95%   BMI 24.33 kg/m   Physical Exam  Constitutional: She appears well-developed and well-nourished. No distress.  HENT:  Head: Normocephalic and atraumatic.  Mouth/Throat: No oropharyngeal exudate.  Pale mucous membranes  Eyes: EOM are normal. Pupils are equal, round, and reactive to light. Right eye exhibits no discharge. Left eye exhibits no discharge. No scleral icterus.  Pale conjunctive a  Neck: Normal range of motion. Neck supple. No JVD present. No thyromegaly present.  Cardiovascular: Normal rate, regular rhythm, normal heart sounds and intact distal pulses. Exam reveals no gallop and no  friction rub.  No murmur heard. Pulmonary/Chest: No respiratory distress. She has wheezes ( Mild expiratory wheezing, speaks in full sentences, mild tachypnea). She has no rales.  Abdominal: Soft. Bowel sounds are normal. She exhibits no distension and no mass. There is no tenderness.  Musculoskeletal: Normal range of motion. She exhibits edema ( Chronic lower extremity edema, symmetrical, pitting, no weeping). She exhibits no tenderness.  Lymphadenopathy:    She has no cervical adenopathy.  Neurological: She is alert. Coordination normal.  Skin: Skin is warm and dry. No rash noted. No erythema.  Psychiatric: She has a normal mood and affect. Her behavior is normal.  Nursing note and vitals reviewed.    ED Treatments / Results  Labs (all labs ordered are listed, but only abnormal results are displayed) Labs Reviewed  BRAIN NATRIURETIC PEPTIDE - Abnormal; Notable for the following components:      Result Value   B Natriuretic Peptide 961.0 (*)    All other components within normal limits  COMPREHENSIVE METABOLIC PANEL - Abnormal; Notable for the following components:   Glucose, Bld 120 (*)    Calcium 8.6 (*)    Albumin 3.1 (*)    All other components within normal limits  CBC - Abnormal; Notable for the following components:   Hemoglobin 6.4 (*)    HCT 25.8 (*)    MCV 66.0 (*)    MCH 16.4 (*)    MCHC 24.8 (*)    RDW 19.5 (*)    All other components within normal limits  CBG MONITORING, ED - Abnormal; Notable for the following components:   Glucose-Capillary 134 (*)    All other components within normal limits  TROPONIN I  OCCULT BLOOD X 1 CARD TO LAB, STOOL  MAGNESIUM  PHOSPHORUS  TYPE AND SCREEN  PREPARE RBC (CROSSMATCH)    Radiology Dg Chest 2 View  Result Date: 03/14/2018 CLINICAL DATA:  Shortness of breath and generalized weakness for 1 week, diabetes mellitus, hypertension, CABG EXAM: CHEST - 2 VIEW COMPARISON:  08/08/2017 FINDINGS: LEFT subclavian sequential  pacemaker leads project at RIGHT atrium and RIGHT ventricle unchanged. Enlargement of cardiac silhouette with pulmonary vascular congestion post CABG and MVR. Calcified tortuous aorta. Bibasilar atelectasis. No definite acute infiltrate, pleural effusion or pneumothorax. Osseous demineralization with marked compression deformity/vertebra plana at the mid to  caudal thoracic spine. IMPRESSION: Enlargement of cardiac silhouette with pulmonary vascular congestion post CABG, pacemaker and MVR. Bibasilar atelectasis. Electronically Signed   By: Ulyses Southward M.D.   On: 03/14/2018 17:40    Procedures .Critical Care Performed by: Eber Hong, MD Authorized by: Eber Hong, MD   Critical care provider statement:    Critical care time (minutes):  35   Critical care time was exclusive of:  Separately billable procedures and treating other patients and teaching time   Critical care was necessary to treat or prevent imminent or life-threatening deterioration of the following conditions: severe anemia and GI bleed.   Critical care was time spent personally by me on the following activities:  Blood draw for specimens, development of treatment plan with patient or surrogate, discussions with consultants, evaluation of patient's response to treatment, examination of patient, obtaining history from patient or surrogate, ordering and performing treatments and interventions, ordering and review of laboratory studies, ordering and review of radiographic studies, pulse oximetry, re-evaluation of patient's condition and review of old charts   (including critical care time)  Medications Ordered in ED Medications  0.9 %  sodium chloride infusion (not administered)  pantoprazole (PROTONIX) injection 40 mg (not administered)  albuterol (PROVENTIL) (2.5 MG/3ML) 0.083% nebulizer solution 5 mg (5 mg Nebulization Given 03/14/18 2143)     Initial Impression / Assessment and Plan / ED Course  I have reviewed the triage  vital signs and the nursing notes.  Pertinent labs & imaging results that were available during my care of the patient were reviewed by me and considered in my medical decision making (see chart for details).  Clinical Course as of Mar 14 2145  Tue Mar 14, 2018  2003 Labs reviewed and the patient does indeed have a new significant anemia with a hemoglobin of 6.4 compared to her prior level of 10.  Platelets are normal, white blood cell count is normal, MCV is decreased at 66 raising the suspicion for iron deficiency however patient will need to have Hemoccult testing as well.  [BM]    Clinical Course User Index [BM] Eber Hong, MD    Labs reviewed, noted above, chest x-ray shows no signs of pulmonary edema, mild vascular congestion, shortness of breath unlikely to be related to pneumonia.  Type and screen ordered, the patient will likely need to have transfusion due to her increasing dyspnea and weakness and presence of a 4 g drop in hemoglobin.  The pt has GI bleed with Hgb < 7 and is getting transfused in the ED - is critically ill  Will discuss with hospitalist for admission.  Dr. Robb Matar to admit  Final Clinical Impressions(s) / ED Diagnoses   Final diagnoses:  Acute GI bleeding  Severe anemia      Eber Hong, MD 03/14/18 2146

## 2018-03-14 NOTE — H&P (Signed)
4        History and Physical    Alison Bakesancy P Ratti WUJ:[id number] DOB: 1932-09-25 DOA: 03/14/2018  PCP: [name], Richard, MD   Patient coming from: Home.  I have personally briefly reviewed patient[facility]  Chief Complaint: Weakness and shortness of breath for a week.  HPI: [name] is a 84 y.o. female with medical history significant of CAD/history of MI/history of CABG, history of paroxysmal atrial fibrillation after CABG without recurrence, history of complete heart block/status post STJ dual pacemaker placement, depression, hypertension, hypothyroidism, mitral regurgitation with mitral valve repair history, osteoarthritis, type 2 diabetes, unspecified glaucoma, pulmonary embolism on Xarelto, history of hemorrhoids who is brought to the emergency department due to weakness and shortness of breath for 1 week.  The family also stated in triage that the patient was looking with a paler call her than usual.  ED Course: Temperature 36.8C (98.3 F, pulse 70, respirations 21, blood pressure 138/64 mmHg O2 sat 92% on room air.  Workup shows a white count of 5.2, hemoglobin 6.4 g/dL and platelets [id number].  Her CMP showed a glucose of 120 mg/dL and albumin level of 3.1 g/dL.  All other values are normal.  Troponin was less than 0.03 ng/mL.  BNP was 961.0 pg/mL.  A 2 view checks radiograph showed previous CABG, cardiomegaly and vascular congestion.  Please see images and full radiology report for further detail.  Review of Systems: As per HPI otherwise 10 point review of systems negative.    Past Medical History:  Diagnosis Date  . CAD (coronary artery disease)    a. s/p PCI to LAD '08, Severe 3V CAD by cath 03/27/12 --> CABG  . CHF (congestive heart failure) (HCC)   . Complete heart block (HCC)    a. s/p STJ dual chamber pacemaker  . Depression   . Hypertension   . Hypothyroidism   . Mitral regurgitation    moderate by cath, mild by echo 03/2012  .  Osteoarthrosis, unspecified whether generalized or localized, unspecified site   . Paroxysmal atrial fibrillation (HCC)    a. identified s/p CABG b. OAC discontinued 02/2014 after no recurrence  . Personal history of other diseases of digestive system   . Postoperative anemia due to acute blood loss 06/04/2017  . Type II or unspecified type diabetes mellitus without mention of complication, not stated as uncontrolled   . Unspecified glaucoma(365.9)   . Unspecified hemorrhoids without mention of complication     Past Surgical History:  Procedure Laterality Date  . CHOLECYSTECTOMY  12/2006  . CORONARY ARTERY BYPASS GRAFT  04/03/2012   Procedure: CORONARY ARTERY BYPASS GRAFTING (CABG);  Surgeon: Kerin PernaPeter Van Trigt, MD;  [facility];  Service: Open Heart Surgery;  Laterality: N/A;  CABG x four;  using left internal mammary artery and bilateral greater saphenous veins  . ERCP  2008  . INTRAMEDULLARY (IM) NAIL INTERTROCHANTERIC Right 06/02/2017   Procedure: INTRAMEDULLARY (IM) NAIL INTERTROCHANTRIC RIGHT HIP;  Surgeon: Sheral ApleyMurphy, Timothy D, MD;  Location: [facility] OR;  Service: Orthopedics;  Laterality: Right;  . LEFT HEART CATHETERIZATION WITH CORONARY ANGIOGRAM N/A 03/27/2012   Procedure: LEFT HEART CATHETERIZATION WITH CORONARY ANGIOGRAM;  Surgeon: Herby Abrahamhomas D Stuckey, MD;  [facility];  Service: Cardiovascular;  Laterality: N/A;  . MITRAL VALVE REPAIR  04/03/2012   Procedure: MITRAL VALVE REPAIR (MVR);  Surgeon: Kerin PernaPeter Van Trigt, MD;  [facility];  Service: Open Heart Surgery;  Laterality: N/A;  .  PERMANENT PACEMAKER INSERTION N/A 02/26/2015   STJ Assurity dual chamber pacemaker implanted by Dr Johney Frame for symptomatic complete heart block     reports that she quit smoking about 13 years ago. Her smoking use included cigarettes. she has never used smokeless tobacco. She reports that she does not drink alcohol or use drugs.  Allergies  Allergen Reactions  . Lisinopril Anaphylaxis  . Quinine Other (See  Comments)    Pt states heart attack-like symptoms  . Penicillins Swelling and Other (See Comments)    Patient states that she had swelling at the injection site after her last Penicillin shot but no swelling in the throat.  . Sulfonamide Derivatives Rash    Family History  Problem Relation Age of Onset  . Heart disease Mother   . Heart disease Father 71       CABG  . Arrhythmia Brother        slow HR off and on since his 64s    Prior to Admission medications   Medication Sig Start Date End Date Taking? Authorizing Provider  amLODipine (NORVASC) 5 MG tablet Take 5 mg by mouth daily. 05/23/15  Yes [provider]  carvedilol (COREG) 6.25 MG tablet TAKE ONE TABLET BY MOUTH TWICE DAILY 09/19/17  Yes Branch, Dorothe Pea, MD  Cholecalciferol (VITAMIN D3) 2000 units TABS Take 2,000 Units by mouth daily.   Yes [provider]  docusate sodium (COLACE) 100 MG capsule Take 100 mg by mouth daily as needed for mild constipation.   Yes [provider]  empagliflozin (JARDIANCE) 25 MG TABS tablet Take 25 mg by mouth daily.   Yes [provider]  FLUoxetine (PROZAC) 20 MG capsule Take 20 mg by mouth daily.   Yes [provider]  furosemide (LASIX) 20 MG tablet Take 20 mg by mouth daily.    Yes [provider]  ibuprofen (ADVIL,MOTRIN) 200 MG tablet Take 200 mg by mouth every 6 (six) hours as needed.   Yes [provider]  metFORMIN (GLUCOPHAGE) 500 MG tablet Take by mouth daily with breakfast.   Yes [provider]  omeprazole (PRILOSEC) 20 MG capsule Take 20 mg by mouth daily.   Yes [provider]  polyethylene glycol powder (MIRALAX) powder Take 17 g by mouth daily as needed.   Yes [provider]  rivaroxaban (XARELTO) 20 MG TABS tablet Take 20 mg by mouth daily with supper. Starting on 08/20/2017   Yes [provider]  oxycodone (OXY-IR) 5 MG capsule Take 1 capsule (5 mg total) by mouth every 6 (six)  hours as needed for pain. Patient not taking: Reported on 03/14/2018 08/16/17   [name], MD    Physical Exam: Vitals:   03/14/18 2128 03/14/18 2130 03/14/18 2143 03/14/18 2145  BP:  (!) 146/67    Pulse: 70 70  70  Resp: 18 18  19   Temp:      TempSrc:      SpO2: 95% 95% 100% 97%  Weight:      Height:        Constitutional: NAD, calm, comfortable.   Eyes: PERRL, lids and conjunctivae are pale. ENMT: Mucous membranes are moist. Posterior pharynx clear of any exudate or lesions. Neck: normal, supple, no masses, no thyromegaly Respiratory: clear to auscultation bilaterally, no wheezing, no crackles. Normal respiratory effort. No accessory muscle use.  Cardiovascular: Regular rate and rhythm, no murmurs / rubs / gallops. No extremity edema. 2+ pedal pulses. No carotid bruits.  Abdomen: Soft,  no tenderness, no masses palpated. No hepatosplenomegaly. Bowel sounds positive.  Musculoskeletal: no clubbing / cyanosis. Good ROM, no contractures. Normal muscle tone.  Skin: no rashes, lesions, ulcers on very limited dermatological exam. Neurologic: CN 2-12 grossly intact. Sensation intact, DTR normal. Strength 5/5 in all 4.  Psychiatric: Normal judgment and insight. Alert and oriented x 3. Normal mood.    Labs on Admission: I have personally reviewed following labs and imaging studies  CBC: Recent Labs  Lab 03/14/18 1749  WBC 5.2  HGB 6.4*  HCT 25.8*  MCV 66.0*  PLT 262   Basic Metabolic Panel: Recent Labs  Lab 03/14/18 1749 03/14/18 1916  NA 140  --   K 3.5  --   CL 106  --   CO2 23  --   GLUCOSE 120*  --   BUN 15  --   CREATININE 0.71  --   CALCIUM 8.6*  --   MG  --  1.9  PHOS  --  3.1   GFR: Estimated Creatinine Clearance: 52.8 mL/min (by C-G formula based on SCr of 0.71 mg/dL). Liver Function Tests: Recent Labs  Lab 03/14/18 1749  AST 24  ALT 18  ALKPHOS 59  BILITOT 0.7  PROT 6.7  ALBUMIN 3.1*   No results for input(s): LIPASE, AMYLASE in the last  168 hours. No results for input(s): AMMONIA in the last 168 hours. Coagulation Profile: No results for input(s): INR, PROTIME in the last 168 hours. Cardiac Enzymes: Recent Labs  Lab 03/14/18 1749  TROPONINI <0.03   BNP (last 3 results) No results for input(s): PROBNP in the last 8760 hours. HbA1C: No results for input(s): HGBA1C in the last 72 hours. CBG: Recent Labs  Lab 03/14/18 1644  GLUCAP 134*   Lipid Profile: No results for input(s): CHOL, HDL, LDLCALC, TRIG, CHOLHDL, LDLDIRECT in the last 72 hours. Thyroid Function Tests: No results for input(s): TSH, T4TOTAL, FREET4, T3FREE, THYROIDAB in the last 72 hours. Anemia Panel: No results for input(s): VITAMINB12, FOLATE, FERRITIN, TIBC, IRON, RETICCTPCT in the last 72 hours. Urine analysis:    Component Value Date/Time   COLORURINE YELLOW 08/08/2017 1746   APPEARANCEUR CLEAR 08/08/2017 1746   LABSPEC 1.031 (H) 08/08/2017 1746   PHURINE 5.0 08/08/2017 1746   GLUCOSEU >=500 (A) 08/08/2017 1746   HGBUR NEGATIVE 08/08/2017 1746   BILIRUBINUR NEGATIVE 08/08/2017 1746   KETONESUR 20 (A) 08/08/2017 1746   PROTEINUR NEGATIVE 08/08/2017 1746   UROBILINOGEN 0.2 03/28/2012 0056   NITRITE NEGATIVE 08/08/2017 1746   LEUKOCYTESUR TRACE (A) 08/08/2017 1746    Radiological Exams on Admission: Dg Chest 2 View  Result Date: 03/14/2018 CLINICAL DATA:  Shortness of breath and generalized weakness for 1 week, diabetes mellitus, hypertension, CABG EXAM: CHEST - 2 VIEW COMPARISON:  08/08/2017 FINDINGS: LEFT subclavian sequential pacemaker leads project at RIGHT atrium and RIGHT ventricle unchanged. Enlargement of cardiac silhouette with pulmonary vascular congestion post CABG and MVR. Calcified tortuous aorta. Bibasilar atelectasis. No definite acute infiltrate, pleural effusion or pneumothorax. Osseous demineralization with marked compression deformity/vertebra plana at the mid to caudal thoracic spine. IMPRESSION: Enlargement of cardiac  silhouette with pulmonary vascular congestion post CABG, pacemaker and MVR. Bibasilar atelectasis. Electronically Signed   By: Ulyses Southward M.D.   On: 03/14/2018 17:40   08/09/2017 echocardiogram complete ------------------------------------------------------------------- LV EF: 55% -   60%  ------------------------------------------------------------------- Indications:      Pulmonary embolus 415.19.  ------------------------------------------------------------------- History:   PMH:  Acquired from the patient and from the patient&'s  chart.  Coronary artery disease.  PMH:  S/P CABG x 4. S/P MVR (mitral valve repair)  Myocardial infarction.  Risk factors: Hypertension. Diabetes mellitus. Dyslipidemia.  ------------------------------------------------------------------- Study Conclusions  - Left ventricle: The cavity size was normal. Systolic function was   normal. The estimated ejection fraction was in the range of 55%   to 60%. Wall motion was normal; there were no regional wall   motion abnormalities. Mild concentric and moderate focal basal   septal hypertrophy. - Aortic valve: Mildly calcified annulus. Trileaflet. There was   mild regurgitation. - Mitral valve: S/p mitral valve repair. Functioning normally with   no signficant valvular regurgitation. Calcified annulus. - Right ventricle: Pacer wire or catheter noted in right ventricle.   There are mobile structures which appear to be attached to the   pacer wire. Given the clinical context (pulmonary embolism), this   is likely consistent with thrombus in transit. However,   vegetation cannot entirely be ruled out. Systolic function was   mildly reduced. - Right atrium: The atrium was mildly dilated. Pacer wire or   catheter noted in right atrium. - Atrial septum: No defect or patent foramen ovale was identified. - Tricuspid valve: There was mild-moderate regurgitation. - Pulmonary arteries: PA peak pressure: 38 mm Hg  (S).  Impressions:  - Consider TEE if deemed clinically indicated.  EKG: Independently reviewed. Vent. rate 70 BPM PR interval * ms QRS duration 172 ms QT/QTc 480/518 ms P-R-T axes * 267 77 Ventricular-paced rhythm Abnormal ECG  Assessment/Plan Principal Problem:   GI bleed Admit to telemetry/inpatient. Keep n.p.o. Continue PRBC transfusion. Monitor hematocrit and hemoglobin. Protonix 40 mg IVP every 12 hours. Routine gastroenterology consult.  Active Problems:   Microcytic anemia Secondary to above. Monitor hematocrit and hemoglobin.    Type II or unspecified type diabetes mellitus without     mention of complication, not stated as uncontrolled Currently n.p.o. Hold Jardiance and metformin until clear for oral intake. CBG monitoring every 8 hours while n.p.o. Change CBG monitoring to before meals and at bedtime once the patient is able to eat.    Unspecified glaucoma Currently not using eyedrops.  Let prescription ran out. She is unable to remember the names of the ophthalmic solution. Advised to follow-up with her ophthalmologist as an outpatient.    Essential hypertension Resume amlodipine 5 mg p.o. Daily and carvedilol 6.25 mg p.o. twice daily. Once she has been clear for oral intake.    Coronary artery disease   S/P CABG x 4 Antiplatelet therapy held for now. Continue beta-blocker. Currently not taking a statin or any other therapy.    Hyperlipidemia with target low density   lipoprotein (LDL) cholesterol less than 70 mg/dL\ Currently not taking a statin or any other therapy. Should follow-up with her PCP for lipid panel measurement and therapy options.    Paroxysmal atrial fibrillation (HCC) CHA?DS?-VASc Score of at least 6. No further recurrence since first episode. Apparently has been in sinus rhythm since then. Anticoagulation is contraindicated. Low-dose IV metoprolol for rate control maintenance.    Pulmonary emboli (HCC) Has completed  over 6 months of anticoagulation. Hold Xarelto.    Depression Resume fluoxetine 20 mg p.o. daily once cleared by GI.    Hypothyroidism Currently not taking levothyroxine. Will check TSH.   DVT prophylaxis: On Xarelto. Code Status: Full code. Family Communication:  Disposition Plan: Admit for PRBC transfusion and GI evaluation. Consults called: Routine GI consult. Admission status: Inpatient/telemetry.   Bobette Mo MD [facility] Pager [phone].  If 7PM-7AM, please contact night-coverage [email or web address] Password [facility]  03/14/2018, 10:51 PM

## 2018-03-14 NOTE — ED Triage Notes (Signed)
Pt c/o of weakness and sob x1 week.  Pt's family states she looks paler than normal.

## 2018-03-14 NOTE — ED Notes (Signed)
Date and time results received: 03/14/18 1956 (use smartphrase ".now" to insert current time)  Test: hgb Critical Value: 6.4  Name of Provider Notified: Dr. Hyacinth MeekerMiller at 2003  Orders Received? Or Actions Taken?: no/na

## 2018-03-15 ENCOUNTER — Encounter: Payer: Self-pay | Admitting: Cardiology

## 2018-03-15 ENCOUNTER — Encounter (HOSPITAL_COMMUNITY)
Admission: EM | Disposition: A | Discharge status: Skilled Nursing Facility | Payer: Self-pay | Source: Home / Self Care | Attending: Family Medicine

## 2018-03-15 ENCOUNTER — Encounter (HOSPITAL_COMMUNITY): Payer: Self-pay | Admitting: Internal Medicine

## 2018-03-15 DIAGNOSIS — D62 Acute posthemorrhagic anemia: Secondary | ICD-10-CM

## 2018-03-15 DIAGNOSIS — K317 Polyp of stomach and duodenum: Secondary | ICD-10-CM

## 2018-03-15 DIAGNOSIS — K449 Diaphragmatic hernia without obstruction or gangrene: Secondary | ICD-10-CM

## 2018-03-15 DIAGNOSIS — K228 Other specified diseases of esophagus: Secondary | ICD-10-CM

## 2018-03-15 DIAGNOSIS — D509 Iron deficiency anemia, unspecified: Secondary | ICD-10-CM

## 2018-03-15 DIAGNOSIS — K922 Gastrointestinal hemorrhage, unspecified: Secondary | ICD-10-CM

## 2018-03-15 HISTORY — PX: ESOPHAGOGASTRODUODENOSCOPY: SHX5428

## 2018-03-15 LAB — COMPREHENSIVE METABOLIC PANEL
ALBUMIN: 2.9 g/dL — AB (ref 3.5–5.0)
ALT: 17 U/L (ref 14–54)
AST: 23 U/L (ref 15–41)
Alkaline Phosphatase: 56 U/L (ref 38–126)
Anion gap: 9 (ref 5–15)
BUN: 14 mg/dL (ref 6–20)
CHLORIDE: 108 mmol/L (ref 101–111)
CO2: 24 mmol/L (ref 22–32)
Calcium: 8.4 mg/dL — ABNORMAL LOW (ref 8.9–10.3)
Creatinine, Ser: 0.7 mg/dL (ref 0.44–1.00)
GFR calc Af Amer: >60 mL/min (ref >60)
Glucose, Bld: 109 mg/dL — ABNORMAL HIGH (ref 65–99)
POTASSIUM: 3.6 mmol/L (ref 3.5–5.1)
SODIUM: 141 mmol/L (ref 135–145)
Total Bilirubin: 0.6 mg/dL (ref 0.3–1.2)
Total Protein: 6.2 g/dL — ABNORMAL LOW (ref 6.5–8.1)

## 2018-03-15 LAB — CBC WITH DIFFERENTIAL/PLATELET
BASOS ABS: 0.1 10*3/uL (ref 0.0–0.1)
BASOS PCT: 1 %
Basophils Absolute: 0 10*3/uL (ref 0.0–0.1)
Basophils Relative: 1 %
EOS ABS: 0.1 10*3/uL (ref 0.0–0.7)
EOS ABS: 0.1 10*3/uL (ref 0.0–0.7)
Eosinophils Relative: 1 %
Eosinophils Relative: 1 %
HCT: 25.5 % — ABNORMAL LOW (ref 36.0–46.0)
HCT: 25.6 % — ABNORMAL LOW (ref 36.0–46.0)
Hemoglobin: 6.3 g/dL — CL (ref 12.0–15.0)
Hemoglobin: 6.3 g/dL — CL (ref 12.0–15.0)
LYMPHS PCT: 14 %
Lymphocytes Relative: 25 %
Lymphs Abs: 1.3 10*3/uL (ref 0.7–4.0)
Lymphs Abs: 0.7 10*3/uL (ref 0.7–4.0)
MCH: 16.4 pg — ABNORMAL LOW (ref 26.0–34.0)
MCH: 16.4 pg — ABNORMAL LOW (ref 26.0–34.0)
MCHC: 24.7 g/dL — AB (ref 30.0–36.0)
MCHC: 24.6 g/dL — ABNORMAL LOW (ref 30.0–36.0)
MCV: 66.2 fL — ABNORMAL LOW (ref 78.0–100.0)
MCV: 66.7 fL — AB (ref 78.0–100.0)
MONO ABS: 0.6 10*3/uL (ref 0.1–1.0)
MONO ABS: 0.6 10*3/uL (ref 0.1–1.0)
MONOS PCT: 12 %
Monocytes Relative: 11 %
NEUTROS ABS: 3.2 10*3/uL (ref 1.7–7.7)
NEUTROS PCT: 73 %
Neutro Abs: 3.7 10*3/uL (ref 1.7–7.7)
Neutrophils Relative %: 61 %
PLATELETS: 252 10*3/uL (ref 150–400)
PLATELETS: 277 10*3/uL (ref 150–400)
RBC: 3.85 MIL/uL — ABNORMAL LOW (ref 3.87–5.11)
RBC: 3.84 MIL/uL — AB (ref 3.87–5.11)
RDW: 19.4 % — AB (ref 11.5–15.5)
RDW: 19.4 % — AB (ref 11.5–15.5)
WBC: 5.2 10*3/uL (ref 4.0–10.5)
WBC: 5.2 10*3/uL (ref 4.0–10.5)

## 2018-03-15 LAB — GLUCOSE, CAPILLARY
GLUCOSE-CAPILLARY: 106 mg/dL — AB (ref 65–99)
GLUCOSE-CAPILLARY: 132 mg/dL — AB (ref 65–99)
Glucose-Capillary: 123 mg/dL — ABNORMAL HIGH (ref 65–99)

## 2018-03-15 LAB — HEMOGLOBIN AND HEMATOCRIT, BLOOD
HEMATOCRIT: 34.8 % — AB (ref 36.0–46.0)
Hemoglobin: 9.2 g/dL — ABNORMAL LOW (ref 12.0–15.0)

## 2018-03-15 SURGERY — EGD (ESOPHAGOGASTRODUODENOSCOPY)
Anesthesia: Moderate Sedation

## 2018-03-15 MED ORDER — STERILE WATER FOR IRRIGATION IR SOLN
Status: DC | PRN
  Administered 2018-03-15: 100 mL

## 2018-03-15 MED ORDER — SODIUM CHLORIDE 0.9 % IV SOLN: INTRAVENOUS | Status: DC

## 2018-03-15 MED ORDER — MEPERIDINE HCL 50 MG/ML IJ SOLN
INTRAMUSCULAR | Status: DC | PRN
  Administered 2018-03-15: 15 mg via INTRAVENOUS

## 2018-03-15 MED ORDER — MIDAZOLAM HCL 5 MG/5ML IJ SOLN
INTRAMUSCULAR | Status: AC
  Administered 2018-03-15: 17:00:00
  Filled 2018-03-15: qty 10

## 2018-03-15 MED ORDER — MIDAZOLAM HCL 5 MG/5ML IJ SOLN
INTRAMUSCULAR | Status: DC | PRN
Start: 2018-03-15 — End: 2018-03-15
  Administered 2018-03-15 (×2): 1 mg via INTRAVENOUS

## 2018-03-15 MED ORDER — MEPERIDINE HCL 50 MG/ML IJ SOLN
INTRAMUSCULAR | Status: AC
  Administered 2018-03-15: 17:00:00
  Filled 2018-03-15: qty 1

## 2018-03-15 MED ORDER — LIDOCAINE VISCOUS 2 % MT SOLN
OROMUCOSAL | Status: AC
  Administered 2018-03-15: 17:00:00
  Filled 2018-03-15: qty 15

## 2018-03-15 MED ORDER — FUROSEMIDE 10 MG/ML IJ SOLN
INTRAMUSCULAR | Status: AC
  Filled 2018-03-15: qty 2

## 2018-03-15 MED ORDER — METOPROLOL TARTRATE 5 MG/5ML IV SOLN
2.5000 mg | Freq: Four times a day (QID) | INTRAVENOUS | Status: DC
  Administered 2018-03-15 – 2018-03-18 (×13): 2.5 mg via INTRAVENOUS
  Filled 2018-03-15 (×13): qty 5

## 2018-03-15 MED ORDER — FUROSEMIDE 10 MG/ML IJ SOLN
20.0000 mg | Freq: Once | INTRAMUSCULAR | Status: AC
  Administered 2018-03-15: 20 mg via INTRAVENOUS

## 2018-03-15 MED ORDER — LIDOCAINE VISCOUS 2 % MT SOLN
OROMUCOSAL | Status: DC | PRN
  Administered 2018-03-15: 4 mL via OROMUCOSAL

## 2018-03-15 NOTE — Progress Notes (Signed)
UNMATCHED BLOOD PRODUCT NOTE  Compare the patient ID on the blood tag to the patient ID on the hospital armband and Blood Bank armband. Then confirm the unit number on the blood tag matches the unit number on the blood product.  If a discrepancy is discovered return the product to blood bank immediately.   Blood Product Type: Red Blood Cells  Unit #: [id number]  Product Code #: [id number]   Start Time: 0610  Starting Rate: 120 ml/hr  Rate increase/decreased  (if applicable):      ml/hr  Rate changed time (if applicable):    Stop Time:    All Other Documentation should be documented within the Blood Admin Flowsheet per policy.

## 2018-03-15 NOTE — Op Note (Signed)
[facility] Patient Name: [name] Procedure Date: 03/15/2018 3:46 PM MRN: [id number] Date of Birth: [birth year] Attending MD: Lionel December , MD CSN: [id number] Age: 84 Admit Type: Inpatient Procedure:                Upper GI endoscopy Indications:              Acute post hemorrhagic anemia, Melena Providers:                Lionel December, MD, Edrick Kins, RN, Dyann Ruddle Referring MD:             Duke Salvia. Dondiego, MD Medicines:                Lidocaine spray, Meperidine 15 mg IV, Midazolam 2                            mg IV Complications:            No immediate complications. Estimated Blood Loss:     Estimated blood loss: none. Procedure:                Pre-Anesthesia Assessment:                           - Prior to the procedure, a History and Physical                            was performed, and patient medications and                            allergies were reviewed. The patient's tolerance of                            previous anesthesia was also reviewed. The risks                            and benefits of the procedure and the sedation                            options and risks were discussed with the patient.                            All questions were answered, and informed consent                            was obtained. Prior Anticoagulants: The patient                            last took Xarelto (rivaroxaban) 2 days prior to the                            procedure. ASA Grade Assessment: III - A patient                            with severe systemic disease. After reviewing the  risks and benefits, the patient was deemed in                            satisfactory condition to undergo the procedure.                           After obtaining informed consent, the endoscope was                            passed under direct vision. Throughout the                            procedure, the patient's blood pressure, pulse, and                             oxygen saturations were monitored continuously. The                            [id number]) scope was introduced through the                            mouth, and advanced to the second part of duodenum.                            The upper GI endoscopy was accomplished without                            difficulty. The patient tolerated the procedure                            well. Scope In: 4:06:56 PM Scope Out: 4:12:16 PM Total Procedure Duration: 0 hours 5 minutes 20 seconds  Findings:      The examined esophagus was normal.      The Z-line was irregular and was found 35 cm from the incisors.      A 2 cm hiatal hernia was present.      A few diminutive sessile polyps were found in the gastric body.      The exam of the stomach was otherwise normal.      The duodenal bulb and second portion of the duodenum were normal. Impression:               - Normal esophagus.                           - Z-line irregular, 35 cm from the incisors.                           - 2 cm hiatal hernia.                           - A few gastric polyps.                           - Normal duodenal bulb and second portion of the  duodenum.                           - No specimens collected.                           Comment: No bleeding lesion identified. Moderate Sedation:      Moderate (conscious) sedation was administered by the endoscopy nurse       and supervised by the endoscopist. The following parameters were       monitored: oxygen saturation, heart rate, blood pressure, CO2       capnography and response to care. Total physician intraservice time was       11 minutes. Recommendation:           - Return patient to hospital ward for ongoing care.                           - Continue present medications.                           - Full liquid diet today.                           - To visualize the small bowel, perform video                             capsule endoscopy tomorrow. Procedure Code(s):        --- Professional ---                           [phone], Esophagogastroduodenoscopy, flexible,                            transoral; diagnostic, including collection of                            specimen(s) by brushing or washing, when performed                            (separate procedure)                           99152, Moderate sedation services provided by the                            same physician or other qualified health care                            professional performing the diagnostic or                            therapeutic service that the sedation supports,                            requiring the presence of an independent trained  observer to assist in the monitoring of the                            patient's level of consciousness and physiological                            status; initial 15 minutes of intraservice time,                            patient age 15 years or older Diagnosis Code(s):        --- Professional ---                           K22.8, Other specified diseases of esophagus                           K44.9, Diaphragmatic hernia without obstruction or                            gangrene                           K31.7, Polyp of stomach and duodenum                           D62, Acute posthemorrhagic anemia                           K92.1, Melena (includes Hematochezia) CPT copyright 2016 American Medical Association. All rights reserved. The codes documented in this report are preliminary and upon coder review may  be revised to meet current compliance requirements. Lionel December, MD Lionel December, MD 03/15/2018 4:39:48 PM This report has been signed electronically. Number of Addenda: 0

## 2018-03-15 NOTE — Progress Notes (Signed)
Patient had what appears to be melena for 2 days arrived with weakness dyspnea lightheadedness and hemoglobin of 6.3 currently receiving second unit of PRBCs patient was on Xarelto for paroxysmal A. fib in the face of a pulmonary emboli which is now 6-7 months ago currently in sinus rhythm therefore we may be able to stop Xarelto she is scheduled for endoscopy later this afternoon when more hemodynamically stable [name] [name] DOB: 10-12-1932 DOA: 03/14/2018 PCP: [name], [name], MD   Physical Exam: Blood pressure (!) 146/56, pulse 70, temperature 97.9 F (36.6 C), temperature source Oral, resp. rate 20, height 5\' 8"  (1.727 m), weight 75.2 kg (165 lb 12.6 oz), SpO2 95 %.  Lungs diminished breath sounds in bases no rales wheeze or rhonchi appreciable heart regular rhythm no S3-S4 no heaves thrills or rubs abdomen soft nontender bowel sounds normoactive   Investigations:  No results found for this or any previous visit (from the past 240 hour(s)).   Basic Metabolic Panel: Recent Labs    03/14/18 1749 03/14/18 1916 03/15/18 0235  NA 140  --  141  K 3.5  --  3.6  CL 106  --  108  CO2 23  --  24  GLUCOSE 120*  --  109*  BUN 15  --  14  CREATININE 0.71  --  0.70  CALCIUM 8.6*  --  8.4*  MG  --  1.9  --   PHOS  --  3.1  --    Liver Function Tests: Recent Labs    03/14/18 1749 03/15/18 0235  AST 24 23  ALT 18 17  ALKPHOS 59 56  BILITOT 0.7 0.6  PROT 6.7 6.2*  ALBUMIN 3.1* 2.9*     CBC: Recent Labs    03/15/18 0235 03/15/18 0558  WBC 5.2 5.2  NEUTROABS 3.2 3.7  HGB 6.3* 6.3*  HCT 25.5* 25.6*  MCV 66.2* 66.7*  PLT 252 277    Dg Chest 2 View  Result Date: 03/14/2018 CLINICAL DATA:  Shortness of breath and generalized weakness for 1 week, diabetes mellitus, hypertension, CABG EXAM: CHEST - 2 VIEW COMPARISON:  08/08/2017 FINDINGS: LEFT subclavian sequential pacemaker leads project at RIGHT atrium and RIGHT ventricle unchanged. Enlargement of cardiac  silhouette with pulmonary vascular congestion post CABG and MVR. Calcified tortuous aorta. Bibasilar atelectasis. No definite acute infiltrate, pleural effusion or pneumothorax. Osseous demineralization with marked compression deformity/vertebra plana at the mid to caudal thoracic spine. IMPRESSION: Enlargement of cardiac silhouette with pulmonary vascular congestion post CABG, pacemaker and MVR. Bibasilar atelectasis. Electronically Signed   By: Alison SouthwardMark  [name] M.D.   On: 03/14/2018 17:40      Medications:   Impression:  Principal Problem:   GI bleed Active Problems:   Type II or unspecified type diabetes mellitus without mention of complication, not stated as uncontrolled   Unspecified glaucoma   Essential hypertension   Coronary artery disease   S/P CABG x 4   Hyperlipidemia with target low density lipoprotein (LDL) cholesterol less than 70 mg/dL   Paroxysmal atrial fibrillation (HCC)   Microcytic anemia   Pulmonary emboli (HCC)   Depression   Hypothyroidism     Plan: Discontinue Xarelto.  Hemoglobin hematocrit every 12 hours for 3 days.  Endoscopy as per Dr. Dionicia [name].  Consultants: Gastroenterology.   Procedures   Antibiotics:           Time spent:30 min   LOS: 1 day   [name] M   03/15/2018, 12:50 PM

## 2018-03-15 NOTE — Consult Note (Signed)
Reason for Consult: GI bleed Referring Physician:   [name] is an 84 y.o. female.  HPI:  Admitted yesterday with c/o having diarrhea and weakness. The diarrhea started 2 days ago. She says the stools were black.  Has not had a stool since admission. Noted on admission her Hemoglobin was 6.4. Is receiving her 1st unit of blood. (2 are ordered).  Hx significant for paroxysmal atrial fib, CABG, permanent pacemaker, Hx of complete heart block.  She is maintained on Xarelto.  She denies any abdominal pain. No acid reflux. No prior hx of GI bleeding.  Her last colonoscopy was in 2008 per patient (screening) and was normal.   Past Medical History:  Diagnosis Date  . CAD (coronary artery disease)    a. s/p PCI to LAD '08, Severe 3V CAD by cath 03/27/12 --> CABG  . CHF (congestive heart failure) ([place])   . Complete heart block (HCC)    a. s/p STJ dual chamber pacemaker  . Depression   . Hypertension   . Hypothyroidism   . Mitral regurgitation    moderate by cath, mild by echo 03/2012  . Osteoarthrosis, unspecified whether generalized or localized, unspecified site   . Paroxysmal atrial fibrillation (HCC)    a. identified s/p CABG b. [place] discontinued 02/2014 after no recurrence  . Personal history of other diseases of digestive system   . Postoperative anemia due to acute blood loss 06/04/2017  . Type II or unspecified type diabetes mellitus without mention of complication, not stated as uncontrolled   . Unspecified glaucoma(365.9)   . Unspecified hemorrhoids without mention of complication     Past Surgical History:  Procedure Laterality Date  . CHOLECYSTECTOMY  12/2006  . CORONARY ARTERY BYPASS GRAFT  04/03/2012   Procedure: CORONARY ARTERY BYPASS GRAFTING (CABG);  Surgeon: Ivin Poot, MD;  Location: [facility];  Service: Open Heart Surgery;  Laterality: N/A;  CABG x four;  using left internal mammary artery and bilateral greater saphenous veins  . ERCP  2008  . INTRAMEDULLARY (IM) NAIL  INTERTROCHANTERIC Right 06/02/2017   Procedure: INTRAMEDULLARY (IM) NAIL INTERTROCHANTRIC RIGHT HIP;  Surgeon: Renette Butters, MD;  Location: [facility];  Service: Orthopedics;  Laterality: Right;  . LEFT HEART CATHETERIZATION WITH CORONARY ANGIOGRAM N/A 03/27/2012   Procedure: LEFT HEART CATHETERIZATION WITH CORONARY ANGIOGRAM;  Surgeon: Hillary Bow, MD;  Location: [facility] CATH LAB;  Service: Cardiovascular;  Laterality: N/A;  . MITRAL VALVE REPAIR  04/03/2012   Procedure: MITRAL VALVE REPAIR (MVR);  Surgeon: Ivin Poot, MD;  Location: [facility];  Service: Open Heart Surgery;  Laterality: N/A;  . PERMANENT PACEMAKER INSERTION N/A 02/26/2015   STJ Assurity dual chamber pacemaker implanted by Dr Rayann Heman for symptomatic complete heart block    Family History  Problem Relation Age of Onset  . Heart disease Mother   . Heart disease Father 76       CABG  . Arrhythmia Brother        slow HR off and on since his 20s    Social History:  reports that she quit smoking about 13 years ago. Her smoking use included cigarettes. she has never used smokeless tobacco. She reports that she does not drink alcohol or use drugs.  Allergies:    Medications: I have reviewed the patient's current medications.  Results for orders placed or performed during the hospital encounter of 03/14/18 (from the past 48 hour(s))  CBG monitoring, ED     Status: Abnormal  Collection Time: 03/14/18  4:44 PM  Result Value Ref Range   Glucose-Capillary 134 (H) 65 - 99 mg/dL  Troponin I     Status: None   Collection Time: 03/14/18  5:49 PM  Result Value Ref Range   Troponin I <0.03 <0.03 ng/mL    Comment: Performed at [facility], [address] [place], [place] [address]  Brain natriuretic peptide     Status: Abnormal   Collection Time: 03/14/18  5:49 PM  Result Value Ref Range   B Natriuretic Peptide 961.0 (H) 0.0 - 100.0 pg/mL    Comment: Performed at [facility], [address], [place], [place] [address]  Comprehensive  metabolic panel     Status: Abnormal   Collection Time: 03/14/18  5:49 PM  Result Value Ref Range   Sodium 140 135 - 145 mmol/L   Potassium 3.5 3.5 - 5.1 mmol/L   Chloride 106 101 - 111 mmol/L   CO2 23 22 - 32 mmol/L   Glucose, Bld 120 (H) 65 - 99 mg/dL   BUN 15 6 - 20 mg/dL   Creatinine, Ser 0.71 0.44 - 1.00 mg/dL   Calcium 8.6 (L) 8.9 - 10.3 mg/dL   Total Protein 6.7 6.5 - 8.1 g/dL   Albumin 3.1 (L) 3.5 - 5.0 g/dL   AST 24 15 - 41 U/L   ALT 18 14 - 54 U/L   Alkaline Phosphatase 59 38 - 126 U/L   Total Bilirubin 0.7 0.3 - 1.2 mg/dL   GFR calc non Af Amer >60 >60 mL/min   GFR calc Af Amer >60 >60 mL/min    Comment: (NOTE) The eGFR has been calculated using the CKD EPI equation. This calculation has not been validated in all clinical situations. eGFR's persistently <60 mL/min signify possible Chronic Kidney Disease.    Anion gap 11 5 - 15    Comment: Performed at [facility], [address] [employer] [place], [place] [address]  CBC     Status: Abnormal   Collection Time: 03/14/18  5:49 PM  Result Value Ref Range   WBC 5.2 4.0 - 10.5 K/uL   RBC 3.91 3.87 - 5.11 MIL/uL    Comment: ANISOCYTES ELLIPTOCYTES Schistocytes present POLYCHROMASIA PRESENT    Hemoglobin 6.4 (LL) 12.0 - 15.0 g/dL    Comment: REPEATED TO VERIFY CRITICAL RESULT CALLED TO, READ BACK BY AND VERIFIED WITH: T.TALBOT, RN @ 1957 ON 3.19.19 BY L.BOWMAN    HCT 25.8 (L) 36.0 - 46.0 %   MCV 66.0 (L) 78.0 - 100.0 fL   MCH 16.4 (L) 26.0 - 34.0 pg   MCHC 24.8 (L) 30.0 - 36.0 g/dL   RDW 19.5 (H) 11.5 - 15.5 %   Platelets 262 150 - 400 K/uL    Comment: SPECIMEN CHECKED FOR CLOTS PLATELET COUNT CONFIRMED BY SMEAR Performed at [facility], [address] [place], [place] [address]   Magnesium     Status: None   Collection Time: 03/14/18  7:16 PM  Result Value Ref Range   Magnesium 1.9 1.7 - 2.4 mg/dL    Comment: Performed at [facility], [address] [place], [place] [address]  Phosphorus     Status: None    Collection Time: 03/14/18  7:16 PM  Result Value Ref Range   Phosphorus 3.1 2.5 - 4.6 mg/dL    Comment: Performed at [facility], [address] [place], [place] [address]  Type and screen     Status: None (Preliminary result)   Collection Time:  03/14/18  8:38 PM  Result Value Ref Range   ABO/RH(D) O NEG    Antibody Screen POS    Sample Expiration 03/17/2018    Antibody Identification ANTI D ANTI C    Unit Number [id number]    Blood Component Type RED CELLS,LR    Unit division 00    Status of Unit ALLOCATED    Transfusion Status OK TO TRANSFUSE    Crossmatch Result COMPATIBLE    Donor AG Type NEGATIVE FOR C ANTIGEN    Unit Number      [id number] Performed at [facility], [place] [address] [place], [place] [address]    Blood Component Type      RED CELLS,LR Performed at [facility] [address], [place], [place] [address]    Unit division      00 Performed at [facility], [place] [address], [place], [place] [address]    Status of Unit      ISSUED Performed at [facility], [address], [place], [place] [address]    Transfusion Status OK TO TRANSFUSE    Crossmatch Result COMPATIBLE    Donor AG Type NEGATIVE FOR C ANTIGEN   Prepare RBC     Status: None   Collection Time: 03/14/18  8:38 PM  Result Value Ref Range   Order Confirmation      ORDER PROCESSED BY BLOOD BANK Performed at [facility], [address], [place], [place] [address]   Glucose, capillary     Status: Abnormal   Collection Time: 03/14/18 11:43 PM  Result Value Ref Range   Glucose-Capillary 124 (H) 65 - 99 mg/dL  CBC with Differential/Platelet     Status: Abnormal   Collection Time: 03/15/18  2:35 AM  Result Value Ref Range   WBC 5.2 4.0 - 10.5 K/uL   RBC 3.85 (L) 3.87 - 5.11 MIL/uL    Comment: Schistocytes present ANISOCYTES MICROCYTES    Hemoglobin 6.3 (LL) 12.0 - 15.0 g/dL    Comment: REPEATED TO VERIFY CRITICAL RESULT CALLED TO, READ BACK BY AND VERIFIED  WITH: ALSTON,C @ 0420 ON 3.20.19 BY BOWMAN,L    HCT 25.5 (L) 36.0 - 46.0 %   MCV 66.2 (L) 78.0 - 100.0 fL   MCH 16.4 (L) 26.0 - 34.0 pg   MCHC 24.7 (L) 30.0 - 36.0 g/dL   RDW 19.4 (H) 11.5 - 15.5 %   Platelets 252 150 - 400 K/uL   Neutrophils Relative % 61 %   Neutro Abs 3.2 1.7 - 7.7 K/uL   Lymphocytes Relative 25 %   Lymphs Abs 1.3 0.7 - 4.0 K/uL   Monocytes Relative 12 %   Monocytes Absolute 0.6 0.1 - 1.0 K/uL   Eosinophils Relative 1 %   Eosinophils Absolute 0.1 0.0 - 0.7 K/uL   Basophils Relative 1 %   Basophils Absolute 0.0 0.0 - 0.1 K/uL    Comment: Performed at [facility], [address], [place], [place] [address]  Comprehensive metabolic panel     Status: Abnormal   Collection Time: 03/15/18  2:35 AM  Result Value Ref Range   Sodium 141 135 - 145 mmol/L   Potassium 3.6 3.5 - 5.1 mmol/L   Chloride 108 101 - 111 mmol/L   CO2 24 22 - 32 mmol/L   Glucose, Bld 109 (H) 65 - 99 mg/dL   BUN 14 6 - 20 mg/dL   Creatinine, Ser 0.70 0.44 - 1.00 mg/dL   Calcium 8.4 (  L) 8.9 - 10.3 mg/dL   Total Protein 6.2 (L) 6.5 - 8.1 g/dL   Albumin 2.9 (L) 3.5 - 5.0 g/dL   AST 23 15 - 41 U/L   ALT 17 14 - 54 U/L   Alkaline Phosphatase 56 38 - 126 U/L   Total Bilirubin 0.6 0.3 - 1.2 mg/dL   GFR calc non Af Amer >60 >60 mL/min   GFR calc Af Amer >60 >60 mL/min    Comment: (NOTE) The eGFR has been calculated using the CKD EPI equation. This calculation has not been validated in all clinical situations. eGFR's persistently <60 mL/min signify possible Chronic Kidney Disease.    Anion gap 9 5 - 15    Comment: Performed at [facility], [address], [place], [place] [address]  CBC with Differential/Platelet     Status: Abnormal   Collection Time: 03/15/18  5:58 AM  Result Value Ref Range   WBC 5.2 4.0 - 10.5 K/uL   RBC 3.84 (L) 3.87 - 5.11 MIL/uL   Hemoglobin 6.3 (LL) 12.0 - 15.0 g/dL    Comment: RESULT REPEATED AND VERIFIED CRITICAL RESULT CALLED TO, READ BACK BY AND VERIFIED  WITH: [place].C AT 0725 BY HUFFINES,S ON 03/15/18.    HCT 25.6 (L) 36.0 - 46.0 %   MCV 66.7 (L) 78.0 - 100.0 fL   MCH 16.4 (L) 26.0 - 34.0 pg   MCHC 24.6 (L) 30.0 - 36.0 g/dL   RDW 19.4 (H) 11.5 - 15.5 %   Platelets 277 150 - 400 K/uL    Comment: SPECIMEN CHECKED FOR CLOTS GIANT PLATELETS SEEN PLATELET COUNT CONFIRMED BY SMEAR    Neutrophils Relative % 73 %   Lymphocytes Relative 14 %   Monocytes Relative 11 %   Eosinophils Relative 1 %   Basophils Relative 1 %   Neutro Abs 3.7 1.7 - 7.7 K/uL   Lymphs Abs 0.7 0.7 - 4.0 K/uL   Monocytes Absolute 0.6 0.1 - 1.0 K/uL   Eosinophils Absolute 0.1 0.0 - 0.7 K/uL   Basophils Absolute 0.1 0.0 - 0.1 K/uL   RBC Morphology POLYCHROMASIA PRESENT     Comment: TARGET CELLS STOMATOCYTES ELLIPTOCYTES TEARDROP CELLS    WBC Morphology MILD LEFT SHIFT (1-5% METAS, OCC MYELO, OCC BANDS)     Comment: Performed at [facility], [address], [place], [place] [address]  Glucose, capillary     Status: Abnormal   Collection Time: 03/15/18  8:35 AM  Result Value Ref Range   Glucose-Capillary 106 (H) 65 - 99 mg/dL   Comment 1 Notify RN    Comment 2 Document in Chart     Dg Chest 2 View  Result Date: 03/14/2018 CLINICAL DATA:  Shortness of breath and generalized weakness for 1 week, diabetes mellitus, hypertension, CABG EXAM: CHEST - 2 VIEW COMPARISON:  08/08/2017 FINDINGS: LEFT subclavian sequential pacemaker leads project at RIGHT atrium and RIGHT ventricle unchanged. Enlargement of cardiac silhouette with pulmonary vascular congestion post CABG and MVR. Calcified tortuous aorta. Bibasilar atelectasis. No definite acute infiltrate, pleural effusion or pneumothorax. Osseous demineralization with marked compression deformity/vertebra plana at the mid to caudal thoracic spine. IMPRESSION: Enlargement of cardiac silhouette with pulmonary vascular congestion post CABG, pacemaker and MVR. Bibasilar atelectasis. Electronically Signed   By: Lavonia Dana M.D.    On: 03/14/2018 17:40    ROS Blood pressure (!) 149/65, pulse 69, temperature 98.3 F (36.8 C), temperature source Oral, resp. rate 16, height '5\' 8"'$  (1.727 m), weight 165 lb 12.6 oz (  75.2 kg), SpO2 94 %. Physical Exam Alert and oriented. Skin warm and dry. Oral mucosa is moist.   . Sclera anicteric, conjunctivae is pink. Thyroid not enlarged. No cervical lymphadenopathy. Lungs clear. Heart regular rate and rhythm.  Abdomen is soft. Bowel sounds are positive. No hepatomegaly. No abdominal masses felt. No tenderness.    extremities. Lower extremity edema. Ted hose in place. .  Assessment/Plan: Melena. Gastric ulcer needs to be ruled out. Dr. Laural Golden is aware. Possible EGD today.   [name]  03/15/2018, 8:57 AM

## 2018-03-15 NOTE — Progress Notes (Signed)
Brief EGD note:  Normal mucosa of esophagus. Small sliding hiatal hernia. Few small hyperplastic appearing polyps gastric body. These were left alone. No no ulcer or other abnormalities involving gastric mucosa. Normal duodenal bulb and post bulbar duodenum.

## 2018-03-16 ENCOUNTER — Encounter (HOSPITAL_COMMUNITY)
Admission: EM | Disposition: A | Discharge status: Skilled Nursing Facility | Payer: Self-pay | Source: Home / Self Care | Attending: Family Medicine

## 2018-03-16 ENCOUNTER — Encounter: Payer: Medicare HMO | Admitting: Internal Medicine

## 2018-03-16 ENCOUNTER — Encounter (HOSPITAL_COMMUNITY): Payer: Self-pay | Admitting: *Deleted

## 2018-03-16 HISTORY — PX: GIVENS CAPSULE STUDY: SHX5432

## 2018-03-16 LAB — HEMOGLOBIN AND HEMATOCRIT, BLOOD
HCT: 32.8 % — ABNORMAL LOW (ref 36.0–46.0)
HEMATOCRIT: 34.1 % — AB (ref 36.0–46.0)
HEMOGLOBIN: 8.9 g/dL — AB (ref 12.0–15.0)
Hemoglobin: 9.1 g/dL — ABNORMAL LOW (ref 12.0–15.0)

## 2018-03-16 LAB — CBC WITH DIFFERENTIAL/PLATELET
BASOS ABS: 0 10*3/uL (ref 0.0–0.1)
Basophils Relative: 0 %
Eosinophils Absolute: 0.1 10*3/uL (ref 0.0–0.7)
Eosinophils Relative: 1 %
HEMATOCRIT: 33.7 % — AB (ref 36.0–46.0)
HEMOGLOBIN: 8.9 g/dL — AB (ref 12.0–15.0)
LYMPHS PCT: 7 %
Lymphs Abs: 0.6 10*3/uL — ABNORMAL LOW (ref 0.7–4.0)
MCH: 19 pg — ABNORMAL LOW (ref 26.0–34.0)
MCHC: 26.4 g/dL — ABNORMAL LOW (ref 30.0–36.0)
MCV: 71.9 fL — AB (ref 78.0–100.0)
MONOS PCT: 10 %
Monocytes Absolute: 0.9 10*3/uL (ref 0.1–1.0)
NEUTROS PCT: 82 %
Neutro Abs: 7.1 10*3/uL (ref 1.7–7.7)
Platelets: 251 10*3/uL (ref 150–400)
RBC: 4.69 MIL/uL (ref 3.87–5.11)
RDW: 22.9 % — ABNORMAL HIGH (ref 11.5–15.5)
WBC: 8.7 10*3/uL (ref 4.0–10.5)

## 2018-03-16 LAB — TYPE AND SCREEN
ABO/RH(D): O NEG
Antibody Screen: POSITIVE
DONOR AG TYPE: NEGATIVE
Donor AG Type: NEGATIVE
UNIT DIVISION: 0
Unit division: 0

## 2018-03-16 LAB — GLUCOSE, CAPILLARY
GLUCOSE-CAPILLARY: 124 mg/dL — AB (ref 65–99)
GLUCOSE-CAPILLARY: 132 mg/dL — AB (ref 65–99)
Glucose-Capillary: 100 mg/dL — ABNORMAL HIGH (ref 65–99)

## 2018-03-16 LAB — BPAM RBC
BLOOD PRODUCT EXPIRATION DATE: 201904032359
ISSUE DATE / TIME: 201903200559
Unit Type and Rh: 9500

## 2018-03-16 SURGERY — IMAGING PROCEDURE, GI TRACT, INTRALUMINAL, VIA CAPSULE

## 2018-03-16 MED ORDER — METOCLOPRAMIDE HCL 5 MG/ML IJ SOLN
5.0000 mg | Freq: Once | INTRAMUSCULAR | Status: AC
  Administered 2018-03-16: 5 mg via INTRAVENOUS
  Filled 2018-03-16: qty 2

## 2018-03-16 MED ORDER — SODIUM CHLORIDE 0.9 % IV SOLN: INTRAVENOUS | Status: DC

## 2018-03-16 NOTE — Progress Notes (Signed)
No source of bleed found on EGD yesterday awaiting camera study today for small bowel.  Hemoglobin 8.9 patient comfortable little anxious waiting camera study hemodynamically stable hemoglobin hematocrit will be performed every 12 hours [name] P Betancur ZOX:[id number] DOB: [birth year] DOA: 03/14/2018 PCP: [name], Syaire Saber, MD   Physical Exam: Blood pressure (!) 150/62, pulse 69, temperature 99 F (37.2 C), resp. rate 18, height 5\' 8"  (1.727 m), weight 75.2 kg (165 lb 12.6 oz), SpO2 95 %.  Lungs clear to A&P no rales wheeze or rhonchi heart regular rhythm no S3-S4 in the usual rubs abdomen soft nontender bowel sounds normoactive no guarding no rebound   Investigations:  No results found for this or any previous visit (from the past 240 hour(s)).   Basic Metabolic Panel: Recent Labs    03/14/18 1749 03/14/18 1916 03/15/18 0235  NA 140  --  141  K 3.5  --  3.6  CL 106  --  108  CO2 23  --  24  GLUCOSE 120*  --  109*  BUN 15  --  14  CREATININE 0.71  --  0.70  CALCIUM 8.6*  --  8.4*  MG  --  1.9  --   PHOS  --  3.1  --    Liver Function Tests: Recent Labs    03/14/18 1749 03/15/18 0235  AST 24 23  ALT 18 17  ALKPHOS 59 56  BILITOT 0.7 0.6  PROT 6.7 6.2*  ALBUMIN 3.1* 2.9*     CBC: Recent Labs    03/15/18 0558  03/16/18 0051 03/16/18 0426  WBC 5.2  --   --  8.7  NEUTROABS 3.7  --   --  7.1  HGB 6.3*   < > 8.9* 8.9*  HCT 25.6*   < > 32.8* 33.7*  MCV 66.7*  --   --  71.9*  PLT 277  --   --  251   < > = values in this interval not displayed.    Dg Chest 2 View  Result Date: 03/14/2018 CLINICAL DATA:  Shortness of breath and generalized weakness for 1 week, diabetes mellitus, hypertension, CABG EXAM: CHEST - 2 VIEW COMPARISON:  08/08/2017 FINDINGS: LEFT subclavian sequential pacemaker leads project at RIGHT atrium and RIGHT ventricle unchanged. Enlargement of cardiac silhouette with pulmonary vascular congestion post CABG and MVR. Calcified tortuous aorta. Bibasilar  atelectasis. No definite acute infiltrate, pleural effusion or pneumothorax. Osseous demineralization with marked compression deformity/vertebra plana at the mid to caudal thoracic spine. IMPRESSION: Enlargement of cardiac silhouette with pulmonary vascular congestion post CABG, pacemaker and MVR. Bibasilar atelectasis. Electronically Signed   By: Ulyses SouthwardMark  Boles M.D.   On: 03/14/2018 17:40      Medications:  Impression:    Plan: Await camera study today hemoglobin hematocrit every 12 hours Consultants: Gastroenterology  Procedures EGD performed yesterday camera study today   Antibiotics:            Time spent: 30 minutes  LOS: 2 days   Candus Braud M   03/16/2018, 1:14 PM

## 2018-03-16 NOTE — Progress Notes (Signed)
Patient has no complaints.  She has not had a BM today. Hgb from this afternoon is 9.1. Given capsule study could not be performed early in the morning. And swallow the capsule is afternoon and it is still in the stomach. Patient given 5 mg of metoclopramide IV. Study will be reviewed tomorrow morning.

## 2018-03-17 ENCOUNTER — Encounter (HOSPITAL_COMMUNITY): Payer: Self-pay | Admitting: Internal Medicine

## 2018-03-17 DIAGNOSIS — Z4889 Encounter for other specified surgical aftercare: Secondary | ICD-10-CM

## 2018-03-17 LAB — GLUCOSE, CAPILLARY
GLUCOSE-CAPILLARY: 133 mg/dL — AB (ref 65–99)
GLUCOSE-CAPILLARY: 182 mg/dL — AB (ref 65–99)
Glucose-Capillary: 155 mg/dL — ABNORMAL HIGH (ref 65–99)

## 2018-03-17 LAB — CBC WITH DIFFERENTIAL/PLATELET
BASOS PCT: 1 %
Basophils Absolute: 0.1 10*3/uL (ref 0.0–0.1)
EOS PCT: 1 %
Eosinophils Absolute: 0.1 10*3/uL (ref 0.0–0.7)
HEMATOCRIT: 33.1 % — AB (ref 36.0–46.0)
HEMOGLOBIN: 9.2 g/dL — AB (ref 12.0–15.0)
Lymphocytes Relative: 13 %
Lymphs Abs: 0.8 10*3/uL (ref 0.7–4.0)
MCH: 20.1 pg — AB (ref 26.0–34.0)
MCHC: 27.8 g/dL — ABNORMAL LOW (ref 30.0–36.0)
MCV: 72.3 fL — ABNORMAL LOW (ref 78.0–100.0)
MONOS PCT: 9 %
Monocytes Absolute: 0.5 10*3/uL (ref 0.1–1.0)
NEUTROS PCT: 76 %
Neutro Abs: 4.5 10*3/uL (ref 1.7–7.7)
Platelets: 166 10*3/uL (ref 150–400)
RBC: 4.58 MIL/uL (ref 3.87–5.11)
RDW: 24 % — ABNORMAL HIGH (ref 11.5–15.5)
WBC: 6 10*3/uL (ref 4.0–10.5)

## 2018-03-17 LAB — HEMOGLOBIN AND HEMATOCRIT, BLOOD
HCT: 33.7 % — ABNORMAL LOW (ref 36.0–46.0)
HCT: 34 % — ABNORMAL LOW (ref 36.0–46.0)
Hemoglobin: 8.9 g/dL — ABNORMAL LOW (ref 12.0–15.0)
Hemoglobin: 9.3 g/dL — ABNORMAL LOW (ref 12.0–15.0)

## 2018-03-17 MED ORDER — ONDANSETRON HCL 4 MG/2ML IJ SOLN
4.0000 mg | Freq: Four times a day (QID) | INTRAMUSCULAR | Status: DC | PRN
  Administered 2018-03-17: 4 mg via INTRAVENOUS
  Filled 2018-03-17: qty 2

## 2018-03-17 MED ORDER — ACETAMINOPHEN 325 MG PO TABS
650.0000 mg | ORAL_TABLET | Freq: Four times a day (QID) | ORAL | Status: DC | PRN
  Administered 2018-03-17: 650 mg via ORAL
  Filled 2018-03-17: qty 2

## 2018-03-17 NOTE — Op Note (Signed)
Small Bowel [place] Capsule Study Procedure date: March 16, 2018  Referring Provider: Oval Linseyichard Dondiego, MD PCP:  Dr. [name], Richard, MD  Indication for procedure:   Patient is 84 year old Caucasian female who presented with melena and anemia in setting of anticoagulation.  Patient underwent an esophagogastroduodenoscopy and no bleeding lesion was identified.  She may be bleeding from small bowel follow-through and therefore undergoing small bowel given capsule study.    Findings:   Patient was able to swallow given capsule without any difficulty. Capsule reach cecum and 8 hours 43 minutes and 23 seconds. Study duration 9 hours 22 minutes and 57 seconds. No mucosal abnormalities noted in the small bowel. No blood or coffee-ground material noted either.   First Gastric image: 2-hrs 17 min and 39 sec First Duodenal image: 4 hrs 7 min and 22 sec First Ileo-Cecal Valve image: 8 hrs 42 min and 44 sec First Cecal image: 8 hrs 43 min and 23 sec Gastric Passage time: 1 hr and 50 min Small Bowel Passage time: 4 hrs and 36 min  Summary & Recommendations:  No bleeding lesion identified in small bowel. Slow transit of given capsule through the esophagus indicative of motility disorder.  No structural normality identified on recent EGD. No bleeding source identified on EGD or small bowel given capsule study. If bleeding recurs patient will undergo colonoscopy.

## 2018-03-17 NOTE — Evaluation (Signed)
Physical Therapy Evaluation Patient Details Name: [name] MRN: [id number] DOB: 1932/04/20 Today's Date: 03/17/2018   History of Present Illness   [name] is a 84 y.o. female with medical history significant of CAD/history of MI/history of CABG, history of paroxysmal atrial fibrillation after CABG without recurrence, history of complete heart block/status post STJ dual pacemaker placement, depression, hypertension, hypothyroidism, mitral regurgitation with mitral valve repair history, osteoarthritis, type 2 diabetes, unspecified glaucoma, pulmonary embolism on Xarelto, history of hemorrhoids who is brought to the emergency department due to weakness and shortness of breath for 1 week.  The family also stated in triage that the patient was looking with a paler call her than usual    Clinical Impression  Patient functioning below baseline demonstrating slow labored movement for sitting up requiring assistance, limited for taking steps with RW due to c/o fatigue and fall risk especially when making turns.  Patient tolerated sitting up in chair after therapy with family member in room.  Patient's daughter state she and her family unable to lift patient.  Patient will benefit from continued physical therapy in hospital and recommended venue below to increase strength, balance, endurance for safe ADLs and gait.    Follow Up Recommendations SNF;Supervision/Assistance - 24 hour    Equipment Recommendations  None recommended by PT    Recommendations for Other Services       Precautions / Restrictions Precautions Precautions: Fall Restrictions Weight Bearing Restrictions: No      Mobility  Bed Mobility Overal bed mobility: Needs Assistance Bed Mobility: Supine to Sit     Supine to sit: Min assist     General bed mobility comments: slow labored movement  Transfers Overall transfer level: Needs assistance Equipment used: Rolling walker (2 wheeled) Transfers: Sit to/from  [employer] Sit to Stand: Min assist Stand pivot transfers: Min assist;Mod assist       General transfer comment: slow labored movement with verbal cues with safety during transfer to chair  Ambulation/Gait Ambulation/Gait assistance: Min assist Ambulation Distance (Feet): 15 Feet Assistive device: Rolling walker (2 wheeled) Gait Pattern/deviations: Decreased step length - right;Decreased step length - left;Decreased stride length   Gait velocity interpretation: Below normal speed for age/gender General Gait Details: slow labored movement with difficulty turning with RW, tends to scissor legs, limited secondary to c/o fatigue, on room air with O2 sats between 90-93%  Stairs            Wheelchair Mobility    Modified Rankin (Stroke Patients Only)       Balance Overall balance assessment: Needs assistance Sitting-balance support: Feet supported;No upper extremity supported Sitting balance-Leahy Scale: Good     Standing balance support: Bilateral upper extremity supported;During functional activity Standing balance-Leahy Scale: Poor Standing balance comment: fair/poor with RW                             Pertinent Vitals/Pain Pain Assessment: No/denies pain    Home Living Family/patient expects to be discharged to:: Private residence Living Arrangements: Alone Available Help at Discharge: Family;Available PRN/intermittently Type of Home: House Home Access: Ramped entrance     Home Layout: One level Home Equipment: Toilet riser;Walker - 2 wheels Additional Comments: patient's walker in her room    Prior Function Level of Independence: Independent with assistive device(s)         Comments: Ambulates household distances with RW     Hand Dominance  Extremity/Trunk Assessment   Upper Extremity Assessment Upper Extremity Assessment: Generalized weakness    Lower Extremity Assessment Lower Extremity Assessment:  Generalized weakness    Cervical / Trunk Assessment Cervical / Trunk Assessment: Normal  Communication   Communication: HOH(has hearing bilateal hearing aides on)  Cognition Arousal/Alertness: Awake/alert Behavior During Therapy: WFL for tasks assessed/performed Overall Cognitive Status: Within Functional Limits for tasks assessed                                        General Comments      Exercises     Assessment/Plan    PT Assessment Patient needs continued PT services  PT Problem List Decreased strength;Decreased activity tolerance;Decreased balance;Decreased mobility       PT Treatment Interventions Gait training;Functional mobility training;Therapeutic activities;Therapeutic exercise;Patient/family education    PT Goals (Current goals can be found in the Care Plan section)  Acute Rehab PT Goals Patient Stated Goal: return home with family to assist (family unable to lift patient per patient's daughter) PT Goal Formulation: With patient/family Time For Goal Achievement: 03/24/18 Potential to Achieve Goals: Good    Frequency Min 3X/week   Barriers to discharge        Co-evaluation               AM-PAC PT "6 Clicks" Daily Activity  Outcome Measure Difficulty turning over in bed (including adjusting bedclothes, sheets and blankets)?: A Little Difficulty moving from lying on back to sitting on the side of the bed? : A Little Difficulty sitting down on and standing up from a chair with arms (e.g., wheelchair, bedside commode, etc,.)?: A Lot Help needed moving to and from a bed to chair (including a wheelchair)?: A Lot Help needed walking in hospital room?: A Lot Help needed climbing 3-5 steps with a railing? : Total 6 Click Score: 13    End of Session   Activity Tolerance: Patient tolerated treatment well;Patient limited by fatigue Patient left: in chair;with call bell/phone within reach;with family/visitor present Nurse Communication:  Mobility status PT Visit Diagnosis: Unsteadiness on feet (R26.81);Other abnormalities of gait and mobility (R26.89);Muscle weakness (generalized) (M62.81)    Time: 1445-1510 PT Time Calculation (min) (ACUTE ONLY): 25 min   Charges:   PT Evaluation $PT Eval Moderate Complexity: 1 Mod PT Treatments $Therapeutic Activity: 23-37 mins   PT G Codes:        3:20 PM, 03/17/18 Ocie BobJames Koty An[place], MPT Physical Therapist with [facility] [phone] office [phone] mobile phone

## 2018-03-17 NOTE — Plan of Care (Signed)
  Problem: Acute Rehab PT Goals(only PT should resolve) Goal: Pt Will Go Supine/Side To Sit Outcome: Progressing Flowsheets (Taken 03/17/2018 1523) Pt will go Supine/Side to Sit: with supervision Goal: Patient Will Transfer Sit To/From Stand Outcome: Progressing Flowsheets (Taken 03/17/2018 1523) Patient will transfer sit to/from stand: with supervision Goal: Pt Will Transfer Bed To Chair/Chair To Bed Outcome: Progressing Flowsheets (Taken 03/17/2018 1523) Pt will Transfer Bed to Chair/Chair to Bed: min guard assist Goal: Pt Will Ambulate Outcome: Progressing Flowsheets (Taken 03/17/2018 1523) Pt will Ambulate: with min guard assist;50 feet;with rolling walker  3:23 PM, 03/17/18 Ocie BobJames Joci Dress, MPT Physical Therapist with [facility] [phone] office [phone] mobile phone

## 2018-03-17 NOTE — Progress Notes (Addendum)
Patient complains of feeling weak. She has no other complaints. According to her daughter she ate all of her lunch. No bowel movement today. Hemoglobin 9.2 g this morning and 8.9 this afternoon. Small bowel given capsule study results reviewed with patient's daughters. No bleeding lesion identified in small bowel.  See separate report. Patient not interested in further workup unless bleeding recurs. Will recheck H&H in a.m.

## 2018-03-17 NOTE — Progress Notes (Signed)
Camera has not passed yet globin stable at 9.1 patient assisted to bathroom totally deconditioned at present we will ask physical therapy to come on board for strengthening and ambulation [name] P Podesta RUE:[id number] DOB: [birth year] DOA: 03/14/2018 PCP: [name], Lai Hendriks, MD   Physical Exam: Blood pressure (!) 136/59, pulse 70, temperature 98.7 F (37.1 C), temperature source Oral, resp. rate 18, height 5\' 8"  (1.727 m), weight 75.2 kg (165 lb 12.6 oz), SpO2 (!) 88 %.  Lungs clear to A&P no rales wheeze or rhonchi heart regular rhythm no S3-S4 no heaves thrills or rubs abdomen soft nontender bowel sounds normoactive   Investigations:  No results found for this or any previous visit (from the past 240 hour(s)).   Basic Metabolic Panel: Recent Labs    03/14/18 1749 03/14/18 1916 03/15/18 0235  NA 140  --  141  K 3.5  --  3.6  CL 106  --  108  CO2 23  --  24  GLUCOSE 120*  --  109*  BUN 15  --  14  CREATININE 0.71  --  0.70  CALCIUM 8.6*  --  8.4*  MG  --  1.9  --   PHOS  --  3.1  --    Liver Function Tests: Recent Labs    03/14/18 1749 03/15/18 0235  AST 24 23  ALT 18 17  ALKPHOS 59 56  BILITOT 0.7 0.6  PROT 6.7 6.2*  ALBUMIN 3.1* 2.9*     CBC: Recent Labs    03/16/18 0426  03/17/18 0038 03/17/18 0530  WBC 8.7  --   --  6.0  NEUTROABS 7.1  --   --  4.5  HGB 8.9*   < > 9.3* 9.2*  HCT 33.7*   < > 34.0* 33.1*  MCV 71.9*  --   --  72.3*  PLT 251  --   --  166   < > = values in this interval not displayed.    No results found.    Medications  Impression:  Principal Problem:   GI bleed Active Problems:   Type II or unspecified type diabetes mellitus without mention of complication, not stated as uncontrolled   Unspecified glaucoma   Essential hypertension   Coronary artery disease   S/P CABG x 4   Hyperlipidemia with target low density lipoprotein (LDL) cholesterol less than 70 mg/dL   Paroxysmal atrial fibrillation (HCC)   Microcytic anemia  Pulmonary emboli (HCC)   Depression   Hypothyroidism     Plan: Physical therapy for strengthening and ambulation hemoglobin hematocrit every 12 hours four-point walker with seat for ambulation assistance  Consultants: GI   Procedures EGD and capsule study   Antibiotics:          Time spent: 30 minutes   LOS: 3 days   Ramiah Helfrich M   03/17/2018, 1:30 PM

## 2018-03-18 LAB — CBC WITH DIFFERENTIAL/PLATELET
BASOS PCT: 0 %
Basophils Absolute: 0 10*3/uL (ref 0.0–0.1)
EOS PCT: 1 %
Eosinophils Absolute: 0.1 10*3/uL (ref 0.0–0.7)
HEMATOCRIT: 32.6 % — AB (ref 36.0–46.0)
HEMOGLOBIN: 8.7 g/dL — AB (ref 12.0–15.0)
LYMPHS PCT: 13 %
Lymphs Abs: 0.8 10*3/uL (ref 0.7–4.0)
MCH: 19.3 pg — ABNORMAL LOW (ref 26.0–34.0)
MCHC: 26.7 g/dL — ABNORMAL LOW (ref 30.0–36.0)
MCV: 72.3 fL — AB (ref 78.0–100.0)
MONO ABS: 0.6 10*3/uL (ref 0.1–1.0)
MONOS PCT: 11 %
NEUTROS PCT: 75 %
Neutro Abs: 4.4 10*3/uL (ref 1.7–7.7)
Platelets: 216 10*3/uL (ref 150–400)
RBC: 4.51 MIL/uL (ref 3.87–5.11)
RDW: 24.2 % — ABNORMAL HIGH (ref 11.5–15.5)
WBC: 5.9 10*3/uL (ref 4.0–10.5)

## 2018-03-18 LAB — HEMOGLOBIN AND HEMATOCRIT, BLOOD
HCT: 30.6 % — ABNORMAL LOW (ref 36.0–46.0)
HEMATOCRIT: 31.2 % — AB (ref 36.0–46.0)
Hemoglobin: 8.2 g/dL — ABNORMAL LOW (ref 12.0–15.0)
Hemoglobin: 8.3 g/dL — ABNORMAL LOW (ref 12.0–15.0)

## 2018-03-18 LAB — GLUCOSE, CAPILLARY
GLUCOSE-CAPILLARY: 133 mg/dL — AB (ref 65–99)
GLUCOSE-CAPILLARY: 155 mg/dL — AB (ref 65–99)
Glucose-Capillary: 191 mg/dL — ABNORMAL HIGH (ref 65–99)

## 2018-03-18 MED ORDER — PANTOPRAZOLE SODIUM 40 MG PO TBEC
40.0000 mg | DELAYED_RELEASE_TABLET | Freq: Two times a day (BID) | ORAL | Status: DC
  Administered 2018-03-18 – 2018-03-22 (×8): 40 mg via ORAL
  Filled 2018-03-18 (×8): qty 1

## 2018-03-18 MED ORDER — METOPROLOL TARTRATE 25 MG PO TABS
25.0000 mg | ORAL_TABLET | Freq: Two times a day (BID) | ORAL | Status: DC
  Administered 2018-03-18 – 2018-03-22 (×8): 25 mg via ORAL
  Filled 2018-03-18 (×8): qty 1

## 2018-03-18 NOTE — Progress Notes (Signed)
  Subjective:  Patient states she has good appetite.  She continues to complain of feeling extremely weak.  She only has been able to take few steps with walker.  She has not had a BM today or yesterday.  She denies abdominal pain.  Objective: Blood pressure (!) 149/60, pulse 70, temperature 97.9 F (36.6 C), temperature source Oral, resp. rate 18, height 5\' 8"  (1.727 m), weight 165 lb 12.6 oz (75.2 kg), SpO2 97 %. Patient is alert and in no acute distress. Abdomen is full but soft and nontender with organomegaly or masses..  Labs/studies Results:  Recent Labs    03/16/18 0426  03/17/18 0530 03/17/18 1354 03/18/18 0102 03/18/18 0648  WBC 8.7  --  6.0  --   --  5.9  HGB 8.9*   < > 9.2* 8.9* 8.2* 8.7*  HCT 33.7*   < > 33.1* 33.7* 30.6* 32.6*  PLT 251  --  166  --   --  216   < > = values in this interval not displayed.      Assessment:  #1.  GI bleed inactive.  Patient presented with melena and anemia in the setting of anticoagulation.  EGD was followed by small bowel given capsule study but no definite source identified.  Patient has stopped bleeding.  No further workup planned unless bleeding recurs.  #2.  Anemia secondary to GI bleed.  She has received 2 units of PRBCs.  Hemoglobin is low but stable.  #3.  History of paroxysmal atrial fibrillation and pulmonary emboli noted on CTA chest of August 08, 2017.  Recommendations:  H&H in a.m.

## 2018-03-18 NOTE — Progress Notes (Signed)
Patient is exceedingly weak deconditioned.  His four-point walker and can only walk to the door without fatigue.  Hemoglobin is stable.  IV medicine switch to p.o. today will have her get out of bed to chair every shift in addition to physical therapy for strengthening and ambulation [name] P Hinners WJX:[id number] DOB: 1932-01-28 DOA: 03/14/2018 PCP: [name], Piera Downs, MD   Physical Exam: Blood pressure (!) 149/60, pulse 70, temperature 97.9 F (36.6 C), temperature source Oral, resp. rate 18, height 5\' 8"  (1.727 m), weight 75.2 kg (165 lb 12.6 oz), SpO2 97 %.  Lungs clear to A&P no rales wheeze rhonchi heart regular rhythm no S3-S4 no heaves thrills or rubs abdomen soft nontender bowel sounds normoactive  Investigations:  No results found for this or any previous visit (from the past 240 hour(s)).   Basic Metabolic Panel: No results for input(s): NA, K, CL, CO2, GLUCOSE, BUN, CREATININE, CALCIUM, MG, PHOS in the last 72 hours. Liver Function Tests: No results for input(s): AST, ALT, ALKPHOS, BILITOT, PROT, ALBUMIN in the last 72 hours.   CBC: Recent Labs    03/17/18 0530  03/18/18 0102 03/18/18 0648  WBC 6.0  --   --  5.9  NEUTROABS 4.5  --   --  4.4  HGB 9.2*   < > 8.2* 8.7*  HCT 33.1*   < > 30.6* 32.6*  MCV 72.3*  --   --  72.3*  PLT 166  --   --  216   < > = values in this interval not displayed.    No results found.    Medications:   Impression:  Principal Problem:   GI bleed Active Problems:   Type II or unspecified type diabetes mellitus without mention of complication, not stated as uncontrolled   Unspecified glaucoma   Essential hypertension   Coronary artery disease   S/P CABG x 4   Hyperlipidemia with target low density lipoprotein (LDL) cholesterol less than 70 mg/dL   Paroxysmal atrial fibrillation (HCC)   Microcytic anemia   Pulmonary emboli (HCC)   Depression   Hypothyroidism     Plan: Out of bed to chair 3 times daily.  Physical therapy for  strengthening and ambulation.  Switch Lopressor to 25 p.o. twice daily switch Protonix to 40 p.o. twice daily.  Monitor hemoglobin hematocrit daily  Consultants: Gastroenterology   Procedures EGD and camera study unrevealing   Antibiotics:            Time spent: 30 minutes  LOS: 4 days   Jalonda Antigua M   03/18/2018, 11:38 AM

## 2018-03-19 ENCOUNTER — Inpatient Hospital Stay (HOSPITAL_COMMUNITY): Payer: Medicare HMO

## 2018-03-19 DIAGNOSIS — I10 Essential (primary) hypertension: Secondary | ICD-10-CM

## 2018-03-19 LAB — GLUCOSE, CAPILLARY
GLUCOSE-CAPILLARY: 152 mg/dL — AB (ref 65–99)
GLUCOSE-CAPILLARY: 161 mg/dL — AB (ref 65–99)
GLUCOSE-CAPILLARY: 203 mg/dL — AB (ref 65–99)
Glucose-Capillary: 153 mg/dL — ABNORMAL HIGH (ref 65–99)
Glucose-Capillary: 164 mg/dL — ABNORMAL HIGH (ref 65–99)

## 2018-03-19 LAB — CBC WITH DIFFERENTIAL/PLATELET
Basophils Absolute: 0 10*3/uL (ref 0.0–0.1)
Basophils Relative: 0 %
EOS ABS: 0.1 10*3/uL (ref 0.0–0.7)
Eosinophils Relative: 1 %
HCT: 33 % — ABNORMAL LOW (ref 36.0–46.0)
Hemoglobin: 8.7 g/dL — ABNORMAL LOW (ref 12.0–15.0)
Lymphocytes Relative: 12 %
Lymphs Abs: 0.8 10*3/uL (ref 0.7–4.0)
MCH: 19.2 pg — ABNORMAL LOW (ref 26.0–34.0)
MCHC: 26.4 g/dL — ABNORMAL LOW (ref 30.0–36.0)
MCV: 72.7 fL — ABNORMAL LOW (ref 78.0–100.0)
MONO ABS: 0.7 10*3/uL (ref 0.1–1.0)
Monocytes Relative: 11 %
NEUTROS PCT: 76 %
Neutro Abs: 5 10*3/uL (ref 1.7–7.7)
PLATELETS: 209 10*3/uL (ref 150–400)
RBC: 4.54 MIL/uL (ref 3.87–5.11)
RDW: 24.8 % — ABNORMAL HIGH (ref 11.5–15.5)
WBC: 6.6 10*3/uL (ref 4.0–10.5)

## 2018-03-19 LAB — HEMOGLOBIN AND HEMATOCRIT, BLOOD
HCT: 32.3 % — ABNORMAL LOW (ref 36.0–46.0)
Hemoglobin: 8.6 g/dL — ABNORMAL LOW (ref 12.0–15.0)

## 2018-03-19 MED ORDER — ALBUTEROL SULFATE (2.5 MG/3ML) 0.083% IN NEBU
2.5000 mg | INHALATION_SOLUTION | Freq: Four times a day (QID) | RESPIRATORY_TRACT | Status: DC
  Administered 2018-03-19 – 2018-03-20 (×3): 2.5 mg via RESPIRATORY_TRACT
  Filled 2018-03-19 (×3): qty 3

## 2018-03-19 NOTE — Progress Notes (Signed)
Was in another room at 1719 when a security guard asked me if my patient in [id number] was supposed to be walking on her own, I told him no and headed for her room. I then heard a crashing sound and walked in to find the patient sitting in the floor and she reported to me that she fell and hit her head. I then obtained vitals, they were WNL. We assisted the patient back to the bed, after assessing her. We then paged the doctor, he ordered Q2 neuro checks for 8 hours, we are to report any changes in her neuro checks to the doctor.

## 2018-03-19 NOTE — Progress Notes (Signed)
Asked by staff to evaluate patient after she suffered a fall.  Patient was ambulating to the bathroom and reports that her oxygen tubing was not long enough, causing her to fall.  She did not have any syncope, lightheadedness or dizziness prior to fall and has full recollection of event.  She did hit her head during the fall and has a small scalp hematoma on the back of her head.  Her neurologic exam is otherwise unrevealing.  Strength is equal bilaterally.  Pupils are round, reactive to light and equal.  She is speaking appropriately and denies any changes in her vision.  She denies any significant pain at this time.  She does have some tenderness over scalp hematoma when palpated but otherwise appears comfortable.  She will need frequent neuro checks for the next 8 hours.  If there is any change in mental status/level of alertness or she develops persistent vomiting, she will need stat CT head.  We will continue observation for now.  Darden RestaurantsJehanzeb Wayman Hoard

## 2018-03-19 NOTE — Progress Notes (Signed)
She complains of dyspnea for 24 hours with generalized weakness only able to ambulate to the door with assistance in four-point walker concern is for atelectasis will perform chest x-ray PA lateral, incentive spirometry and check be met today as well as hemoglobin hematocrit in a.[name]. which is stable. [name] [name]:[id number] DOB: 1932-01-31 DOA: 03/14/2018 PCP: Alison Gaskins, MD   Physical Exam: Blood pressure 122/74, pulse 69, temperature 98.4 F (36.9 C), temperature source Oral, resp. rate 18, height '5\' 8"'$  (1.727 [name]), weight 75.2 kg (165 lb 12.6 oz), SpO2 95 %.  Lungs diminished breath sounds in both bases no rales no wheeze no rhonchi appreciable heart regular rhythm no S3-S4 no heaves or thrills or rubs abdomen soft nontender bowel sounds normoactive   Investigations:  No results found for this or any previous visit (from the past 240 hour(s)).   Basic Metabolic Panel: No results for input(s): NA, K, CL, CO2, GLUCOSE, BUN, CREATININE, CALCIUM, MG, PHOS in the last 72 hours. Liver Function Tests: No results for input(s): AST, ALT, ALKPHOS, BILITOT, PROT, ALBUMIN in the last 72 hours.   CBC: Recent Labs    03/18/18 0648 03/18/18 1342 03/19/18 0626  WBC 5.9  --  6.6  NEUTROABS 4.4  --  5.0  HGB 8.7* 8.3* 8.7*  HCT 32.6* 31.2* 33.0*  MCV 72.3*  --  72.7*  PLT 216  --  209    No results found.    Medications:   Impression:  Principal Problem:   GI bleed Active Problems:   Type II or unspecified type diabetes mellitus without mention of complication, not stated as uncontrolled   Unspecified glaucoma   Essential hypertension   Coronary artery disease   S/P CABG x 4   Hyperlipidemia with target low density lipoprotein (LDL) cholesterol less than 70 mg/dL   Paroxysmal atrial fibrillation (HCC)   Microcytic anemia   Pulmonary emboli (HCC)   Depression   Hypothyroidism     Plan: Incentive spirometry.  Chest x-ray PA and lateral.  Physical therapy.  Out of bed  to chair 3 times daily.  Albuterol nebulizer 4 times daily.  Will get social work involved for rehab stay which I believe will be necessary patient is agreeable to it  Consultants: Gastroenterology   Procedures  Antibiotics:            Time spent: 30 minutes   LOS: 5 days   [name]   03/19/2018, 11:39 AM

## 2018-03-20 ENCOUNTER — Encounter (HOSPITAL_COMMUNITY): Payer: Self-pay | Admitting: Internal Medicine

## 2018-03-20 LAB — GLUCOSE, CAPILLARY
GLUCOSE-CAPILLARY: 134 mg/dL — AB (ref 65–99)
Glucose-Capillary: 222 mg/dL — ABNORMAL HIGH (ref 65–99)
Glucose-Capillary: 233 mg/dL — ABNORMAL HIGH (ref 65–99)
Glucose-Capillary: 209 mg/dL — ABNORMAL HIGH (ref 65–99)

## 2018-03-20 LAB — CBC WITH DIFFERENTIAL/PLATELET
BASOS PCT: 1 %
Basophils Absolute: 0.1 10*3/uL (ref 0.0–0.1)
EOS PCT: 0 %
Eosinophils Absolute: 0 10*3/uL (ref 0.0–0.7)
HCT: 34.9 % — ABNORMAL LOW (ref 36.0–46.0)
HEMOGLOBIN: 9.1 g/dL — AB (ref 12.0–15.0)
LYMPHS PCT: 13 %
Lymphs Abs: 0.9 10*3/uL (ref 0.7–4.0)
MCH: 19.2 pg — AB (ref 26.0–34.0)
MCHC: 26.1 g/dL — ABNORMAL LOW (ref 30.0–36.0)
MCV: 73.5 fL — AB (ref 78.0–100.0)
Monocytes Absolute: 0.8 10*3/uL (ref 0.1–1.0)
Monocytes Relative: 12 %
NEUTROS PCT: 74 %
Neutro Abs: 5.1 10*3/uL (ref 1.7–7.7)
Platelets: 216 10*3/uL (ref 150–400)
RBC: 4.75 MIL/uL (ref 3.87–5.11)
RDW: 25.1 % — ABNORMAL HIGH (ref 11.5–15.5)
WBC: 6.9 10*3/uL (ref 4.0–10.5)

## 2018-03-20 LAB — BASIC METABOLIC PANEL
ANION GAP: 8 (ref 5–15)
BUN: 17 mg/dL (ref 6–20)
CHLORIDE: 106 mmol/L (ref 101–111)
CO2: 27 mmol/L (ref 22–32)
Calcium: 8.3 mg/dL — ABNORMAL LOW (ref 8.9–10.3)
Creatinine, Ser: 0.68 mg/dL (ref 0.44–1.00)
GFR calc Af Amer: >60 mL/min (ref >60)
GFR calc non Af Amer: >60 mL/min (ref >60)
GLUCOSE: 149 mg/dL — AB (ref 65–99)
POTASSIUM: 3.9 mmol/L (ref 3.5–5.1)
Sodium: 141 mmol/L (ref 135–145)

## 2018-03-20 LAB — HEMOGLOBIN AND HEMATOCRIT, BLOOD
HCT: 33 % — ABNORMAL LOW (ref 36.0–46.0)
Hemoglobin: 8.7 g/dL — ABNORMAL LOW (ref 12.0–15.0)

## 2018-03-20 MED ORDER — FUROSEMIDE 10 MG/ML IJ SOLN
20.0000 mg | Freq: Two times a day (BID) | INTRAMUSCULAR | Status: DC
  Administered 2018-03-21 – 2018-03-22 (×3): 20 mg via INTRAVENOUS
  Filled 2018-03-20 (×4): qty 2

## 2018-03-20 MED ORDER — ALBUTEROL SULFATE (2.5 MG/3ML) 0.083% IN NEBU
2.5000 mg | INHALATION_SOLUTION | Freq: Three times a day (TID) | RESPIRATORY_TRACT | Status: DC
  Administered 2018-03-20 – 2018-03-22 (×7): 2.5 mg via RESPIRATORY_TRACT
  Filled 2018-03-20 (×7): qty 3

## 2018-03-20 NOTE — Clinical Social Work Note (Signed)
Clinical Social Work Assessment  Patient Details  Name: [name] MRN: [id number] Date of Birth: 1932-09-25  Date of referral:  03/20/18               Reason for consult:  Facility Placement                Permission sought to share information with:    Permission granted to share information::     Name::        Agency::     Relationship::     Contact Information:     Housing/Transportation Living arrangements for the past 2 months:  [facility] Single Family Home Source of Information:  Patient Patient Interpreter Needed:  None Criminal Activity/Legal Involvement Pertinent to Current Situation/Hospitalization:  No - Comment as needed Significant Relationships:  Adult Children Lives with:  Self Do you feel safe going back to the place where you live?  Yes Need for family participation in patient care:  Yes (Comment)  Care giving concerns:  None identified at baseline.    Social Worker assessment / plan:  Patient lives alone in her single story home. Patient ambulates with a walker and completes ADLs independently at baseline.  Patient's three daughters assist her when needed.  Patient is agreeable to STR at SNF.    Employment status:  Retired Database administratornsurance information:  Managed Medicare PT Recommendations:  Skilled Nursing Facility Information / Referral to community resources:     Patient/Family's Response to care:  [facility]   Patient/Family's Understanding of and Emotional Response to Diagnosis, Current Treatment, and Prognosis:  Patient understands her diagnosis, treatment and prognosis.   Emotional Assessment Appearance:  Appears stated age Attitude/Demeanor/Rapport:    Affect (typically observed):  Accepting Orientation:  Oriented to Self, Oriented to Place, Oriented to  Time, Oriented to Situation Alcohol / Substance use:  Not Applicable Psych involvement (Current and /or in the community):  No (Comment)  Discharge Needs   Concerns to be addressed:  Discharge Planning Concerns Readmission within the last 30 days:  No Current discharge risk:  None Barriers to Discharge:  Insurance Authorization   Annice NeedySettle, Karita Dralle D, LCSW 03/20/2018, 5:36 PM

## 2018-03-20 NOTE — Progress Notes (Signed)
Appreciate excellent care given by Dr. Roderic Palau and nurse workman.  Patient is alert and oriented just a small contusion right frontal area chest x-ray reveals mild volume overload with echo reveals normal systolic function and mitral valve repair plan Reynosa give Lasix 20 mg IV twice daily for 2 days and reassess volume status continue out of bed to chair 3 times daily and physical therapy for ambulation and strengthening before rehab stay [name] XTA:[id number] DOB: 1932/09/20 DOA: 03/14/2018 PCP: Alison Gaskins, MD   Physical Exam: Blood pressure (!) 129/52, pulse 70, temperature 98.2 F (36.8 C), temperature source Oral, resp. rate 18, height 5' 8" (1.727 m), weight 77.8 kg (171 lb 8.3 oz), SpO2 99 %.  Lungs diminished breath sounds at the bases no rales wheeze or rhonchi appreciable heart regular rhythm no S3-S4 no heaves thrills or rubs abdomen soft nontender bowel sounds normoactive   Investigations:  No results found for this or any previous visit (from the past 240 hour(s)).   Basic Metabolic Panel: Recent Labs    03/20/18 0537  NA 141  K 3.9  CL 106  CO2 27  GLUCOSE 149*  BUN 17  CREATININE 0.68  CALCIUM 8.3*   Liver Function Tests: No results for input(s): AST, ALT, ALKPHOS, BILITOT, PROT, ALBUMIN in the last 72 hours.   CBC: Recent Labs    03/19/18 0626 03/19/18 1411 03/20/18 0537  WBC 6.6  --  6.9  NEUTROABS 5.0  --  5.1  HGB 8.7* 8.6* 9.1*  HCT 33.0* 32.3* 34.9*  MCV 72.7*  --  73.5*  PLT 209  --  216    Dg Chest Port 1 View  Result Date: 03/19/2018 CLINICAL DATA:  Atelectasis, choked this morning, history CHF, diabetes mellitus, hypertension, coronary artery disease, CABG EXAM: PORTABLE CHEST 1 VIEW COMPARISON:  Portable exam 1139 hours compared to 03/14/2018 FINDINGS: LEFT subclavian sequential pacemaker leads project over RIGHT atrium and RIGHT ventricle unchanged. Enlargement of cardiac silhouette with pulmonary vascular congestion post CABG and  MVR. Atherosclerotic calcification aorta. Scattered interstitial infiltrates bilaterally consistent with pulmonary edema and CHF. Small bibasilar pleural effusions and atelectasis. No pneumothorax. Bones demineralized. IMPRESSION: CHF with mild pulmonary edema, small bibasilar pleural effusions and atelectasis. Electronically Signed   By: [name] M.D.   On: 03/19/2018 12:30      Medications:   Impression: *    Plan: Add Lasix 20 mg IV twice daily for volume overload static check be met daily for 2 days consider discharge in 48 hours to rehab stay continue out of bed to chair 3 times daily and physical therapy for strengthening and ambulation.  Continue nebulizer therapy 4 times daily as well as incentive spirometry every hour  Consultants: Gastroenterology   Procedures   Antibiotics:          Time spent: 30 minutes   LOS: 6 days   [name] M   03/20/2018, 1:04 PM

## 2018-03-20 NOTE — Clinical Social Work Placement (Signed)
   CLINICAL SOCIAL WORK PLACEMENT  NOTE  Date:  03/20/2018  Patient Details  Name: [name] MRN: [id number] Date of Birth: [birth year]  Clinical Social Work is seeking post-discharge placement for this patient at the Skilled  Nursing Facility level of care (*CSW will initial, date and re-position this form in  chart as items are completed):  Yes   Patient/family provided with [facility] Clinical Social Work Department's list of facilities offering this level of care within the geographic area requested by the patient (or if unable, by the patient's family).  Yes   Patient/family informed of their freedom to choose among providers that offer the needed level of care, that participate in Medicare, Medicaid or managed care program needed by the patient, have an available bed and are willing to accept the patient.  Yes   Patient/family informed of [facility]'s ownership interest in [facility] and [facility], as well as of the fact that they are under no obligation to receive care at these facilities.  PASRR submitted to EDS on       PASRR number received on       Existing PASRR number confirmed on 03/20/18     FL2 transmitted to all facilities in geographic area requested by pt/family on 03/20/18     FL2 transmitted to all facilities within larger geographic area on       Patient informed that his/her managed care company has contracts with or will negotiate with certain facilities, including the following:            Patient/family informed of bed offers received.  Patient chooses bed at       Physician recommends and patient chooses bed at      Patient to be transferred to   on  .  Patient to be transferred to facility by       Patient family notified on   of transfer.  Name of family member notified:        PHYSICIAN       Additional Comment:    _______________________________________________ Annice NeedySettle, Primo Innis D, LCSW 03/20/2018, 5:34 PM

## 2018-03-21 LAB — CBC WITH DIFFERENTIAL/PLATELET
Basophils Absolute: 0 K/uL (ref 0.0–0.1)
Basophils Relative: 0 %
Eosinophils Absolute: 0.1 K/uL (ref 0.0–0.7)
Eosinophils Relative: 1 %
HCT: 31.4 % — ABNORMAL LOW (ref 36.0–46.0)
Hemoglobin: 8.3 g/dL — ABNORMAL LOW (ref 12.0–15.0)
Lymphocytes Relative: 13 %
Lymphs Abs: 1 K/uL (ref 0.7–4.0)
MCH: 19.2 pg — ABNORMAL LOW (ref 26.0–34.0)
MCHC: 26.4 g/dL — ABNORMAL LOW (ref 30.0–36.0)
MCV: 72.7 fL — ABNORMAL LOW (ref 78.0–100.0)
Monocytes Absolute: 0.9 K/uL (ref 0.1–1.0)
Monocytes Relative: 12 %
Neutro Abs: 5.7 K/uL (ref 1.7–7.7)
Neutrophils Relative %: 74 %
Platelets: 169 K/uL (ref 150–400)
RBC: 4.32 MIL/uL (ref 3.87–5.11)
RDW: 25.1 % — ABNORMAL HIGH (ref 11.5–15.5)
WBC: 7.7 K/uL (ref 4.0–10.5)

## 2018-03-21 LAB — BASIC METABOLIC PANEL WITH GFR
Anion gap: 7 (ref 5–15)
BUN: 17 mg/dL (ref 6–20)
CO2: 28 mmol/L (ref 22–32)
Calcium: 8.2 mg/dL — ABNORMAL LOW (ref 8.9–10.3)
Chloride: 104 mmol/L (ref 101–111)
Creatinine, Ser: 0.66 mg/dL (ref 0.44–1.00)
GFR calc Af Amer: >60 mL/min
GFR calc non Af Amer: >60 mL/min
Glucose, Bld: 157 mg/dL — ABNORMAL HIGH (ref 65–99)
Potassium: 4.1 mmol/L (ref 3.5–5.1)
Sodium: 139 mmol/L (ref 135–145)

## 2018-03-21 LAB — GLUCOSE, CAPILLARY
GLUCOSE-CAPILLARY: 175 mg/dL — AB (ref 65–99)
GLUCOSE-CAPILLARY: 225 mg/dL — AB (ref 65–99)
Glucose-Capillary: 137 mg/dL — ABNORMAL HIGH (ref 65–99)
Glucose-Capillary: 144 mg/dL — ABNORMAL HIGH (ref 65–99)

## 2018-03-21 LAB — HEMOGLOBIN AND HEMATOCRIT, BLOOD
HCT: 31.8 % — ABNORMAL LOW (ref 36.0–46.0)
HCT: 33 % — ABNORMAL LOW (ref 36.0–46.0)
Hemoglobin: 8.5 g/dL — ABNORMAL LOW (ref 12.0–15.0)
Hemoglobin: 8.8 g/dL — ABNORMAL LOW (ref 12.0–15.0)

## 2018-03-21 MED ORDER — DIPHENOXYLATE-ATROPINE 2.5-0.025 MG PO TABS
1.0000 | ORAL_TABLET | Freq: Four times a day (QID) | ORAL | Status: DC | PRN
  Administered 2018-03-22: 1 via ORAL
  Filled 2018-03-21: qty 1

## 2018-03-21 NOTE — Progress Notes (Signed)
Physical Therapy Treatment Patient Details Name: [name] MRN: [id number] DOB: [birth year] Today's Date: 03/21/2018    History of Present Illness  Alison Bakesancy P Nine is a 84 y.o. female with medical history significant of CAD/history of MI/history of CABG, history of paroxysmal atrial fibrillation after CABG without recurrence, history of complete heart block/status post STJ dual pacemaker placement, depression, hypertension, hypothyroidism, mitral regurgitation with mitral valve repair history, osteoarthritis, type 2 diabetes, unspecified glaucoma, pulmonary embolism on Xarelto, history of hemorrhoids who is brought to the emergency department due to weakness and shortness of breath for 1 week.  The family also stated in triage that the patient was looking with a paler call her than usual    PT Comments    Patient demonstrates increased endurance/distance for gait training, but limited secondary to c/o fatigue and difficulty breathing requiring frequent instruction in pursed lipped breathing with fair carryover, O2 saturation drops to 88% while on room air during gait training and performing exercises, patient put back on 1 LPM, patient became incontinent of bowel when standing at bedside after returning to room - NT notified to clean patient.  Patient will benefit from continued physical therapy in hospital and recommended venue below to increase strength, balance, endurance for safe ADLs and gait.    Follow Up Recommendations  SNF;Supervision/Assistance - 24 hour     Equipment Recommendations  None recommended by PT    Recommendations for Other Services       Precautions / Restrictions Precautions Precautions: Fall Precaution Comments: monitor O2 saturation Restrictions Weight Bearing Restrictions: No    Mobility  Bed Mobility Overal bed mobility: Needs Assistance Bed Mobility: Supine to Sit;Sit to Supine     Supine to sit: Min assist Sit to supine: Min assist   General bed  mobility comments: slow labored movement  Transfers Overall transfer level: Needs assistance Equipment used: Rolling walker (2 wheeled) Transfers: Sit to/from [employer]Stand;Stand Pivot Transfers Sit to Stand: Min assist Stand pivot transfers: Min assist       General transfer comment: unsteady on feet  Ambulation/Gait Ambulation/Gait assistance: Min assist;Mod assist Ambulation Distance (Feet): 20 Feet Assistive device: Rolling walker (2 wheeled) Gait Pattern/deviations: Decreased step length - right;Decreased step length - left;Decreased stride length   Gait velocity interpretation: Below normal speed for age/gender General Gait Details: demonstrates slow labored unsteady cadence, fatigues easily and had to sit in w/c before making it back to room, on room air with O2 sats dropping to 89%   Stairs            Wheelchair Mobility    Modified Rankin (Stroke Patients Only)       Balance Overall balance assessment: Needs assistance Sitting-balance support: Feet supported;No upper extremity supported Sitting balance-Leahy Scale: Good     Standing balance support: Bilateral upper extremity supported;During functional activity Standing balance-Leahy Scale: Poor Standing balance comment: fair/p                            Cognition Arousal/Alertness: Awake/alert Behavior During Therapy: WFL for tasks assessed/performed Overall Cognitive Status: Within Functional Limits for tasks assessed                                        Exercises General Exercises - Lower Extremity Long Arc Quad: Seated;AROM;Strengthening;Both;10 reps Hip Flexion/Marching: Seated;AROM;Strengthening;Both;10 reps Toe Raises: Seated;AROM;Strengthening;Both;10 reps Heel Raises: Seated;AROM;Strengthening;Both;10  reps    General Comments        Pertinent Vitals/Pain Pain Assessment: No/denies pain    Home Living                      Prior Function             PT Goals (current goals can now be found in the care plan section) Acute Rehab PT Goals Patient Stated Goal: return home with family to assist (family unable to lift patient per patient's daughter) PT Goal Formulation: With patient Time For Goal Achievement: 03/24/18 Potential to Achieve Goals: Good Progress towards PT goals: Progressing toward goals    Frequency    Min 3X/week      PT Plan Current plan remains appropriate    Co-evaluation              AM-PAC PT "6 Clicks" Daily Activity  Outcome Measure  Difficulty turning over in bed (including adjusting bedclothes, sheets and blankets)?: A Little Difficulty moving from lying on back to sitting on the side of the bed? : A Little Difficulty sitting down on and standing up from a chair with arms (e.g., wheelchair, bedside commode, etc,.)?: A Lot Help needed moving to and from a bed to chair (including a wheelchair)?: A Lot Help needed walking in hospital room?: A Lot Help needed climbing 3-5 steps with a railing? : A Lot 6 Click Score: 14    End of Session Equipment Utilized During Treatment: Gait belt Activity Tolerance: Patient tolerated treatment well;Patient limited by fatigue(Limited by diffiuclty breathing) Patient left: in bed;with call bell/phone within reach;with bed alarm set Nurse Communication: Mobility status PT Visit Diagnosis: Unsteadiness on feet (R26.81);Other abnormalities of gait and mobility (R26.89);Muscle weakness (generalized) (M62.81)     Time: 1610-9604 PT Time Calculation (min) (ACUTE ONLY): 32 min  Charges:  $Therapeutic Exercise: 8-22 mins $Therapeutic Activity: 8-22 mins                    G Codes:       3:43 PM, Mar 28, 2018 Ocie Bob, MPT Physical Therapist with [facility] [phone] office [phone] mobile phone

## 2018-03-21 NOTE — [place] FL2 (Signed)
[place] MEDICAID FL2 LEVEL OF CARE SCREENING TOOL     IDENTIFICATION  Patient Name: [name] Birthdate: 1932/11/23 Sex: female Admission Date (Current Location): 03/14/2018  [facility] and [place] Number:  [employer] and Address:  [facility],  [address], [place] [address]      Provider Number: [id number]  Attending Physician Name and Address:  [name], MD  Relative Name and Phone Number:       Current Level of Care: Hospital Recommended Level of Care: Skilled Nursing Facility Prior Approval Number:    Date Approved/Denied:   PASRR Number: [id number])  Discharge Plan: SNF    Current Diagnoses: Patient Active Problem List   Diagnosis Date Noted  . GI bleed 03/14/2018  . Depression 03/14/2018  . Hypothyroidism 03/14/2018  . Anemia 08/18/2017  . Pressure injury of skin 08/14/2017  . Pulmonary emboli (HCC) 08/08/2017  . Acute respiratory failure with hypoxia (HCC) 08/08/2017  . Postoperative anemia due to acute blood loss 06/04/2017  . Microcytic anemia 06/01/2017  . Closed right hip fracture, initial encounter (HCC) 06/01/2017  . Pacemaker 05/30/2015  . Paroxysmal atrial fibrillation (HCC) 02/26/2015  . Complete heart block (HCC) 02/24/2015  . Encounter for therapeutic drug monitoring 02/04/2014  . History of MI (myocardial infarction) 07/17/2012  . Hyperlipidemia with target low density lipoprotein (LDL) cholesterol less than 70 mg/dL 46/96/2952  . S/P CABG x 4 04/14/2012  . S/P MVR (mitral valve repair) 04/13/2012  . Medication intolerance 04/13/2012  . Coronary artery disease 03/28/2012  . Type II or unspecified type diabetes mellitus without mention of complication, not stated as uncontrolled 04/24/2009  . Unspecified glaucoma 04/24/2009  . Essential hypertension 04/24/2009  . HEMORRHOIDS 04/24/2009  . DEGENERATIVE JOINT DISEASE 04/24/2009  . DIVERTICULITIS, HX OF 04/24/2009    Orientation  RESPIRATION BLADDER Height & Weight     Self, Time, Situation  O2(2L) Continent Weight: 171 lb 8.3 oz (77.8 kg) Height:  5\' 8"  (172.7 cm)  BEHAVIORAL SYMPTOMS/MOOD NEUROLOGICAL BOWEL NUTRITION STATUS      Continent Diet(see discharge summary)  AMBULATORY STATUS COMMUNICATION OF NEEDS Skin   Limited Assist Verbally Normal                       Personal Care Assistance Level of Assistance  Bathing, Feeding, Dressing Bathing Assistance: Limited assistance Feeding assistance: Independent Dressing Assistance: Limited assistance     Functional Limitations Info  Sight, Hearing, Speech Sight Info: Adequate Hearing Info: Adequate Speech Info: Adequate    SPECIAL CARE FACTORS FREQUENCY  PT (By licensed PT)     PT Frequency: 5x/week              Contractures Contractures Info: Not present    Additional Factors Info  Code Status, Allergies Code Status Info: Full code Allergies Info: Lisinopril, Quinine, Penicillins, Sulfonamide Derivatives           Current Medications (03/21/2018):  This is the current hospital active medication list Current Facility-Administered Medications  Medication Dose Route Frequency Provider Last Rate Last Dose  . albuterol (PROVENTIL) (2.5 MG/3ML) 0.083% nebulizer solution 2.5 mg  2.5 mg Nebulization TID [name], MD   2.5 mg at 03/21/18 1436  . diphenoxylate-atropine (LOMOTIL) 2.5-0.025 MG per tablet 1 tablet  1 tablet Oral QID PRN [name], MD      . furosemide (LASIX) injection 20 mg  20 mg Intravenous BID [name], MD   20 mg at 03/21/18 1218  .  metoprolol tartrate (LOPRESSOR) tablet 25 mg  25 mg Oral BID [name], Richard, MD   25 mg at 03/21/18 1007  . pantoprazole (PROTONIX) EC tablet 40 mg  40 mg Oral BID [name], Richard, MD   40 mg at 03/21/18 1007     Discharge Medications: Please see discharge summary for a list of discharge medications.  Relevant Imaging Results:  Relevant Lab  Results:   Additional Information SS: [id number]  Shantinique Picazo, LCSW

## 2018-03-21 NOTE — Progress Notes (Signed)
Patient has no IV access.  Multiple attempts were made by day shift, and supervisor.  Will request MD to switch Lasix to PO rather than IV if possible.

## 2018-03-21 NOTE — Progress Notes (Signed)
[facility] patient has some diarrhea we will do C. difficile by PCR exam.  Give Lomotil 1 tab 4 times daily as needed for diarrhea [name] P Ayoub EAV:[id number] DOB: 08-14-1932 DOA: 03/14/2018 PCP: [name], Donyale Berthold, MD   Physical Exam: Blood pressure 134/61, pulse 69, temperature 98.4 F (36.9 C), temperature source Oral, resp. rate 18, height 5\' 8"  (1.727 m), weight 77.8 kg (171 lb 8.3 oz), SpO2 94 %.  Lungs diminished breath sounds at bases no rales wheeze or rhonchi appreciable heart regular rhythm no S3-S4 no heaves thrills or rubs abdomen soft nontender bowel sounds normoactive   Investigations:  No results found for this or any previous visit (from the past 240 hour(s)).   Basic Metabolic Panel: Recent Labs    03/20/18 0537 03/21/18 0435  NA 141 139  K 3.9 4.1  CL 106 104  CO2 27 28  GLUCOSE 149* 157*  BUN 17 17  CREATININE 0.68 0.66  CALCIUM 8.3* 8.2*   Liver Function Tests: No results for input(s): AST, ALT, ALKPHOS, BILITOT, PROT, ALBUMIN in the last 72 hours.   CBC: Recent Labs    03/20/18 0537  03/21/18 0435 03/21/18 1345  WBC 6.9  --  7.7  --   NEUTROABS 5.1  --  5.7  --   HGB 9.1*   < > 8.3* 8.8*  HCT 34.9*   < > 31.4* 33.0*  MCV 73.5*  --  72.7*  --   PLT 216  --  169  --    < > = values in this interval not displayed.    No results found.    Medications:   Impression:  Principal Problem:   GI bleed Active Problems:   Type II or unspecified type diabetes mellitus without mention of complication, not stated as uncontrolled   Unspecified glaucoma   Essential hypertension   Coronary artery disease   S/P CABG x 4   Hyperlipidemia with target low density lipoprotein (LDL) cholesterol less than 70 mg/dL   Paroxysmal atrial fibrillation (HCC)   Microcytic anemia   Pulmonary emboli (HCC)   Depression   Hypothyroidism     Plan: C. difficile by PCR.  Lomotil 1 tab 4 times daily as needed for  diarrhea  Consultants: Gastroenterology   Procedures   Antibiotics:           Time spent: 30 minutes   LOS: 7 days   Neviah Braud M   03/21/2018, 3:11 PM

## 2018-03-21 NOTE — Clinical Social Work Note (Signed)
CSW following. Pt accepted at [facility]. Awaiting insurance authorization. Updated pt and family and pt accepts bed offer. Per MD, pt will likely be stable for dc tomorrow. Will follow up in AM to further assist with dc planning.

## 2018-03-21 NOTE — Progress Notes (Signed)
Inpatient Diabetes Program Recommendations  AACE/ADA: New Consensus Statement on Inpatient Glycemic Control (2015)  Target Ranges:  Prepandial:   less than 140 mg/dL      Peak postprandial:   less than 180 mg/dL (1-2 hours)      Critically ill patients:  140 - 180 mg/dL   Results for [name], Alison P (MRN [id number]) as of 03/21/2018 08:13  Ref. Range 03/20/2018 07:31 03/20/2018 11:28 03/20/2018 16:10 03/20/2018 21:49  Glucose-Capillary Latest Ref Range: 65 - 99 mg/dL 782134 (H) 956222 (H) 213233 (H) 209 (H)   Admit: GIB  History: DM   Home DM Meds: Jardiance 25 mg daily       Metformin 500 mg daily  Current Orders: None      MD- Please consider placing orders for Novolog Sensitive Correction Scale/ SSI (0-9 units) TID AC + HS  (Use Glycemic Control Order set)       --Will follow patient during hospitalization--  Ambrose FinlandJeannine Johnston Shannan Garfinkel RN, MSN, CDE Diabetes Coordinator Inpatient Glycemic Control Team Team Pager: [phone] (8a-5p)

## 2018-03-22 LAB — CBC WITH DIFFERENTIAL/PLATELET
Basophils Absolute: 0 10*3/uL (ref 0.0–0.1)
Basophils Relative: 0 %
EOS ABS: 0.1 10*3/uL (ref 0.0–0.7)
Eosinophils Relative: 1 %
HEMATOCRIT: 31.3 % — AB (ref 36.0–46.0)
HEMOGLOBIN: 8.4 g/dL — AB (ref 12.0–15.0)
LYMPHS ABS: 0.8 10*3/uL (ref 0.7–4.0)
Lymphocytes Relative: 11 %
MCH: 19.5 pg — AB (ref 26.0–34.0)
MCHC: 26.8 g/dL — ABNORMAL LOW (ref 30.0–36.0)
MCV: 72.6 fL — ABNORMAL LOW (ref 78.0–100.0)
Monocytes Absolute: 0.8 10*3/uL (ref 0.1–1.0)
Monocytes Relative: 11 %
NEUTROS ABS: 5.4 10*3/uL (ref 1.7–7.7)
NEUTROS PCT: 77 %
Platelets: 175 10*3/uL (ref 150–400)
RBC: 4.31 MIL/uL (ref 3.87–5.11)
RDW: 25.2 % — ABNORMAL HIGH (ref 11.5–15.5)
WBC: 7.1 10*3/uL (ref 4.0–10.5)

## 2018-03-22 LAB — BASIC METABOLIC PANEL
Anion gap: 8 (ref 5–15)
BUN: 17 mg/dL (ref 6–20)
CHLORIDE: 101 mmol/L (ref 101–111)
CO2: 28 mmol/L (ref 22–32)
Calcium: 8.1 mg/dL — ABNORMAL LOW (ref 8.9–10.3)
Creatinine, Ser: 0.69 mg/dL (ref 0.44–1.00)
GFR calc non Af Amer: >60 mL/min (ref >60)
Glucose, Bld: 175 mg/dL — ABNORMAL HIGH (ref 65–99)
POTASSIUM: 3.6 mmol/L (ref 3.5–5.1)
Sodium: 137 mmol/L (ref 135–145)

## 2018-03-22 LAB — GLUCOSE, CAPILLARY
GLUCOSE-CAPILLARY: 125 mg/dL — AB (ref 65–99)
GLUCOSE-CAPILLARY: 237 mg/dL — AB (ref 65–99)

## 2018-03-22 MED ORDER — ALBUTEROL SULFATE (2.5 MG/3ML) 0.083% IN NEBU: 2.5000 mg | INHALATION_SOLUTION | Freq: Three times a day (TID) | RESPIRATORY_TRACT | 12 refills | Status: AC

## 2018-03-22 MED ORDER — ACETAMINOPHEN 325 MG PO TABS
650.0000 mg | ORAL_TABLET | Freq: Four times a day (QID) | ORAL | Status: DC | PRN
  Administered 2018-03-22: 650 mg via ORAL
  Filled 2018-03-22: qty 2

## 2018-03-22 MED ORDER — DIPHENOXYLATE-ATROPINE 2.5-0.025 MG PO TABS: 1.0000 | ORAL_TABLET | Freq: Four times a day (QID) | ORAL | 0 refills | Status: AC | PRN

## 2018-03-22 NOTE — Progress Notes (Signed)
Patient left floor in stable condition via w/c [facility]. Family at bedside and aware of discharge plan. Daughter reports patient's left hearing aid is missing. Unable to locate in room, linens searched by nursing staff with no hearing aid located. still has right side hearing aid in place. Nursing supervisor notified and EVS notified in case found in laundry. Security notified in case it's found and turned in to them. Earnstine RegalAshley Jonathandavid Marlett, RN

## 2018-03-22 NOTE — Discharge Summary (Signed)
Physician Discharge Summary  [name]:[id number] DOB: [birth year] DOA: 03/14/2018  PCP: [name], MD  Admit date: 03/14/2018 Discharge date: 03/22/2018   Recommendations for Outpatient Follow-up:  Patient is advised to take no NSAIDs to have physical therapy for strengthening and ambulation to monitor hemoglobin hematocrit once a week for 4 successive weeks to take Prilosec 20 mg p.o. daily and to not take any opioid analgesics she is likewise likewise counseled to not take Xarelto or any other anticoagulant. Discharge Diagnoses:  Principal Problem:   GI bleed Active Problems:   Type II or unspecified type diabetes mellitus without mention of complication, not stated as uncontrolled   Unspecified glaucoma   Essential hypertension   Coronary artery disease   S/P CABG x 4   Hyperlipidemia with target low density lipoprotein (LDL) cholesterol less than 70 mg/dL   Paroxysmal atrial fibrillation (HCC)   Microcytic anemia   Pulmonary emboli (HCC)   Depression   Hypothyroidism   Discharge Condition: Good  Filed Weights   03/14/18 1648 03/14/18 2339 03/20/18 0631  Weight: 72.6 kg (160 lb) 75.2 kg (165 lb 12.6 oz) 77.8 kg (171 lb 8.3 oz)    History of present illness:  The patient is an 84 year old white female with known coronary artery disease hypertension hyperlipidemia status post CABG x4 with mitral valve repair previous who had a history of recurrent DVT with a PE was on Xarelto for the previous 7 months.  The patient experienced significant upper GI bleed several years ago on Coumadin and again on Xarelto after 7 months she came in with anemia hemoglobin of 6.4 with's which was symptomatic with dizziness and dyspnea she was found to be in sinus rhythm during this admission it was felt safe to stop her Xarelto for the duration unless a recurrence of DVT or PE occur or atrial fibrillation.  She was seen in consultation by gastroenterology who performed an EGD  showing some Barrett's esophagus there was no source of bleeding found on EGD she subsequently had a camera study which showed delayed peristalsis for which she was given metoclopramide intravenously there was no source of bleed in the small bowel encountered she had a previous colonoscopy within the last year it was felt that that was not a likely source of bleed and colonoscopy was not performed she was likewise was generalized weakness.  She maintain a hemoglobin around the 8-9 level and was to a great degree deconditioned she had physical therapy and she was just a slow improvement due to generalized weakness it was felt to her generalized weakness to give her rehab stay in a skilled nursing facility family and patient agreed as well as myself she is discharged to [facility] for aggressive rehab and physical therapy for strengthening and ambulation thank you  Hospital Course:  See HPI above  Procedures:  EGD and camera study of small bowel  Consultations: Gastroenterology and physical therapy Discharge Instructions  Discharge Instructions    Discharge instructions   Complete by:  As directed    Discharge patient   Complete by:  As directed    Discharge disposition:  03-Skilled Nursing Facility   Discharge patient date:  03/22/2018     Allergies as of 03/22/2018      Reactions   Lisinopril Anaphylaxis   Quinine Other (See Comments)   Pt states heart attack-like symptoms   Penicillins Swelling, Other (See Comments)   Patient states that she had swelling at the injection site after her  last Penicillin shot but no swelling in the throat.   Sulfonamide Derivatives Rash      Medication List    STOP taking these medications   amLODipine 5 MG tablet Commonly known as:  NORVASC   docusate sodium 100 MG capsule Commonly known as:  COLACE   ibuprofen 200 MG tablet Commonly known as:  ADVIL,MOTRIN   MIRALAX powder Generic drug:  polyethylene glycol powder   oxycodone 5 MG  capsule Commonly known as:  OXY-IR   rivaroxaban 20 MG Tabs tablet Commonly known as:  XARELTO     TAKE these medications   albuterol (2.5 MG/3ML) 0.083% nebulizer solution Commonly known as:  PROVENTIL Take 3 mLs (2.5 mg total) by nebulization 3 (three) times daily.   carvedilol 6.25 MG tablet Commonly known as:  COREG TAKE ONE TABLET BY MOUTH TWICE DAILY   diphenoxylate-atropine 2.5-0.025 MG tablet Commonly known as:  LOMOTIL Take 1 tablet by mouth 4 (four) times daily as needed for diarrhea or loose stools.   FLUoxetine 20 MG capsule Commonly known as:  PROZAC Take 20 mg by mouth daily.   furosemide 20 MG tablet Commonly known as:  LASIX Take 20 mg by mouth daily.   JARDIANCE 25 MG Tabs tablet Generic drug:  empagliflozin Take 25 mg by mouth daily.   metFORMIN 500 MG tablet Commonly known as:  GLUCOPHAGE Take by mouth daily with breakfast.   omeprazole 20 MG capsule Commonly known as:  PRILOSEC Take 20 mg by mouth daily.   Vitamin D3 2000 units Tabs Take 2,000 Units by mouth daily.      Allergies  Allergen Reactions  . Lisinopril Anaphylaxis  . Quinine Other (See Comments)    Pt states heart attack-like symptoms  . Penicillins Swelling and Other (See Comments)    Patient states that she had swelling at the injection site after her last Penicillin shot but no swelling in the throat.  . Sulfonamide Derivatives Rash   Contact information for after-discharge care    Destination    [facility] .   Service:  Skilled Nursing Contact information: [address] [place] [address] [phone]               The results of significant diagnostics from this hospitalization (including imaging, microbiology, ancillary and laboratory) are listed below for reference.    Significant Diagnostic Studies: Dg Chest 2 View  Result Date: 03/14/2018 CLINICAL DATA:  Shortness of breath and generalized weakness for 1 week, diabetes  mellitus, hypertension, CABG EXAM: CHEST - 2 VIEW COMPARISON:  08/08/2017 FINDINGS: LEFT subclavian sequential pacemaker leads project at RIGHT atrium and RIGHT ventricle unchanged. Enlargement of cardiac silhouette with pulmonary vascular congestion post CABG and MVR. Calcified tortuous aorta. Bibasilar atelectasis. No definite acute infiltrate, pleural effusion or pneumothorax. Osseous demineralization with marked compression deformity/vertebra plana at the mid to caudal thoracic spine. IMPRESSION: Enlargement of cardiac silhouette with pulmonary vascular congestion post CABG, pacemaker and MVR. Bibasilar atelectasis. Electronically Signed   By: Ulyses Southward M.D.   On: 03/14/2018 17:40   Dg Chest Port 1 View  Result Date: 03/19/2018 CLINICAL DATA:  Atelectasis, choked this morning, history CHF, diabetes mellitus, hypertension, coronary artery disease, CABG EXAM: PORTABLE CHEST 1 VIEW COMPARISON:  Portable exam 1139 hours compared to 03/14/2018 FINDINGS: LEFT subclavian sequential pacemaker leads project over RIGHT atrium and RIGHT ventricle unchanged. Enlargement of cardiac silhouette with pulmonary vascular congestion post CABG and MVR. Atherosclerotic calcification aorta. Scattered interstitial infiltrates bilaterally  consistent with pulmonary edema and CHF. Small bibasilar pleural effusions and atelectasis. No pneumothorax. Bones demineralized. IMPRESSION: CHF with mild pulmonary edema, small bibasilar pleural effusions and atelectasis. Electronically Signed   By: Ulyses SouthwardMark  Boles M.D.   On: 03/19/2018 12:30    Microbiology: No results found for this or any previous visit (from the past 240 hour(s)).   Labs: Basic Metabolic Panel: Recent Labs  Lab 03/20/18 0537 03/21/18 0435 03/22/18 0220  NA 141 139 137  K 3.9 4.1 3.6  CL 106 104 101  CO2 27 28 28   GLUCOSE 149* 157* 175*  BUN 17 17 17   CREATININE 0.68 0.66 0.69  CALCIUM 8.3* 8.2* 8.1*   Liver Function Tests: No results for input(s): AST,  ALT, ALKPHOS, BILITOT, PROT, ALBUMIN in the last 168 hours. No results for input(s): LIPASE, AMYLASE in the last 168 hours. No results for input(s): AMMONIA in the last 168 hours. CBC: Recent Labs  Lab 03/18/18 0648  03/19/18 0626  03/20/18 0537 03/20/18 1846 03/21/18 0121 03/21/18 0435 03/21/18 1345 03/22/18 0220  WBC 5.9  --  6.6  --  6.9  --   --  7.7  --  7.1  NEUTROABS 4.4  --  5.0  --  5.1  --   --  5.7  --  5.4  HGB 8.7*   < > 8.7*   < > 9.1* 8.7* 8.5* 8.3* 8.8* 8.4*  HCT 32.6*   < > 33.0*   < > 34.9* 33.0* 31.8* 31.4* 33.0* 31.3*  MCV 72.3*  --  72.7*  --  73.5*  --   --  72.7*  --  72.6*  PLT 216  --  209  --  216  --   --  169  --  175   < > = values in this interval not displayed.   Cardiac Enzymes: No results for input(s): CKTOTAL, CKMB, CKMBINDEX, TROPONINI in the last 168 hours. BNP: BNP (last 3 results) Recent Labs    03/14/18 1749  BNP 961.0*    ProBNP (last 3 results) No results for input(s): PROBNP in the last 8760 hours.  CBG: Recent Labs  Lab 03/21/18 1118 03/21/18 1700 03/21/18 2209 03/22/18 0805 03/22/18 1211  GLUCAP 175* 144* 225* 125* 237*       Signed:  Coletta Lockner M   Pager: [phone] 03/22/2018, 1:45 PM

## 2018-03-22 NOTE — Care Management Important Message (Signed)
Important Message  Patient Details  Name: [name] MRN: [id number] Date of Birth: 10-Oct-1932   Medicare Important Message Given:  Yes    Renie OraHawkins, Keidy Thurgood Smith 03/22/2018, 11:25 AM

## 2018-03-22 NOTE — Progress Notes (Signed)
Patient okay to discharge to SNF per CSW. Discharg[facility]. Called report to BowlusStephanie, receiving nurse at facility. Discharge packet with AVS to be sent with patient at discharge. Pt in stable condition awaiting EMS transport for discharge. Earnstine RegalAshley Zhanna Melin, RN

## 2018-05-16 ENCOUNTER — Encounter: Payer: Medicare HMO | Admitting: Internal Medicine

## 2018-05-27 DEATH — deceased
